# Patient Record
Sex: Female | Born: 1975 | Race: Asian | Hispanic: No | Marital: Married | Smoking: Never smoker
Health system: Southern US, Community
[De-identification: ages and names within clinical notes are randomized; demographics above are authoritative.]

## PROBLEM LIST (undated history)

## (undated) DIAGNOSIS — C719 Malignant neoplasm of brain, unspecified: Secondary | ICD-10-CM

## (undated) DIAGNOSIS — R7303 Prediabetes: Secondary | ICD-10-CM

## (undated) DIAGNOSIS — I10 Essential (primary) hypertension: Secondary | ICD-10-CM

## (undated) DIAGNOSIS — M3501 Sicca syndrome with keratoconjunctivitis: Secondary | ICD-10-CM

## (undated) HISTORY — DX: Essential (primary) hypertension: I10

## (undated) HISTORY — DX: Malignant neoplasm of brain, unspecified: C71.9

## (undated) HISTORY — DX: Sjogren syndrome with keratoconjunctivitis: M35.01

## (undated) HISTORY — DX: Prediabetes: R73.03

## (undated) HISTORY — PX: DILATION AND CURETTAGE, DIAGNOSTIC / THERAPEUTIC: SUR384

---

## 2015-07-29 DIAGNOSIS — D251 Intramural leiomyoma of uterus: Secondary | ICD-10-CM | POA: Insufficient documentation

## 2017-12-26 ENCOUNTER — Encounter: Payer: Self-pay | Admitting: Family Medicine

## 2017-12-26 ENCOUNTER — Ambulatory Visit: Payer: BLUE CROSS/BLUE SHIELD | Admitting: Family Medicine

## 2017-12-26 VITALS — BP 130/90 | HR 59 | Temp 97.8°F | Ht 59.0 in | Wt 151.6 lb

## 2017-12-26 DIAGNOSIS — R7303 Prediabetes: Secondary | ICD-10-CM | POA: Diagnosis not present

## 2017-12-26 DIAGNOSIS — I10 Essential (primary) hypertension: Secondary | ICD-10-CM | POA: Diagnosis not present

## 2017-12-26 DIAGNOSIS — Z0000 Encounter for general adult medical examination without abnormal findings: Secondary | ICD-10-CM

## 2017-12-26 MED ORDER — NIFEDIPINE ER 30 MG PO TB24: 30.0000 mg | ORAL_TABLET | Freq: Every day | ORAL | 3 refills | Status: DC

## 2017-12-26 NOTE — Progress Notes (Signed)
Patient: Joanne Jackson MRN: [id number] DOB: 1975/05/03 PCP: Orma Flaming, MD     Subjective:  Chief Complaint  Patient presents with  . Establish Care  . Hypertension    HPI: The patient is a 42 y.o. female who presents today for annual exam. She denies any changes to past medical history. There have been no recent hospitalizations. She does not exercise like she should, but tries to follow a healthy diet. Weight has been stable.   Hypertension: Here for follow up of hypertension.  Currently on Nifedipine 30mg  XR. Home readings range from 702 OVZCHYIF/02 diastolic. Takes medication as prescribed and denies any side effects. Exercise includes chasing daughter. Needs to start exercising more. Weight has been stable. Denies any chest pain, headaches, shortness of breath, vision changes, swelling in lower extremities. Diagnosed 7 years ago.   Also has labs that were done in august with previous pcp: cbc, cmp, lipid all normal.  a1c was 5.8.   There is no immunization history on file for this patient.   Colonoscopy: routine screening at 51 years.  Mammogram: she is still breast feeding.. Will get when done.  Pap smear: 2019. Normal  Tdap: utd 09/2015 (in pregnancy)  Hiv: done  Flu: done this year  Review of Systems  Constitutional: Negative for chills, fatigue and fever.  HENT: Negative for dental problem, ear pain, hearing loss and trouble swallowing.   Eyes: Negative for visual disturbance.  Respiratory: Negative for cough, chest tightness and shortness of breath.   Cardiovascular: Negative for chest pain, palpitations and leg swelling.  Gastrointestinal: Negative for abdominal pain, blood in stool, constipation, diarrhea and nausea.  Endocrine: Negative for cold intolerance, polydipsia, polyphagia and polyuria.  Genitourinary: Negative for dysuria and hematuria.  Musculoskeletal: Negative for arthralgias.  Skin: Negative.  Negative for rash.  Neurological: Negative for  dizziness and headaches.  Psychiatric/Behavioral: Negative for dysphoric mood and sleep disturbance. The patient is not nervous/anxious.     Allergies Patient has No Known Allergies.  Past Medical History Patient  has a past medical history of Hypertension and Pre-diabetes.  Surgical History Patient  has a past surgical history that includes Cesarean section and Dilation and curettage, diagnostic / therapeutic.  Family History Pateint's family history includes Heart attack in her father; Hypertension in her father.  Social History Patient  reports that she has never smoked. She has never used smokeless tobacco. She reports that she drank alcohol. She reports that she does not use drugs.    Objective: Vitals:   12/26/17 0918 12/26/17 0955  BP: 118/74 130/90  Pulse: (!) 59   Temp: 97.8 F (36.6 C)   TempSrc: Oral   SpO2: 99%   Weight: 151 lb 9.6 oz (68.8 kg)   Height: 4\' 11"  (1.499 m)     Body mass index is 30.62 kg/m.  Physical Exam  Constitutional: She is oriented to person, place, and time. She appears well-developed and well-nourished.  HENT:  Right Ear: External ear normal.  Left Ear: External ear normal.  Mouth/Throat: Oropharynx is clear and moist.  Eyes: Pupils are equal, round, and reactive to light. Conjunctivae and EOM are normal.  Neck: Normal range of motion. Neck supple. No thyromegaly present.  Cardiovascular: Normal rate, regular rhythm, normal heart sounds and intact distal pulses.  No murmur heard. Pulmonary/Chest: Effort normal and breath sounds normal.  Abdominal: Soft. Bowel sounds are normal. She exhibits no distension. There is no tenderness.  Lymphadenopathy:    She has no cervical adenopathy.  Neurological:  She is alert and oriented to person, place, and time. She displays normal reflexes. No cranial nerve deficit. Coordination normal.  Skin: Skin is warm and dry. No rash noted.  Psychiatric: She has a normal mood and affect. Her behavior is  normal.  Vitals reviewed.  Depression screen [facility] 2/9 12/26/2017  Decreased Interest 0  Down, Depressed, Hopeless 0  PHQ - 2 Score 0       Assessment/plan: 1. Annual physical exam utd on her health maintenance including flu shot. Requesting all records. She has labs with her today. Reviewed all of these. Requesting records of her other OV. Really encouraged her to start working on exercise into her daily routine. F/u in one year or as needed.  Patient counseling [x]    Nutrition: Stressed importance of moderation in sodium/caffeine intake, saturated fat and cholesterol, caloric balance, sufficient intake of fresh fruits, vegetables, fiber, calcium, iron, and 1 mg of folate supplement per day (for females capable of pregnancy).  [x]    Stressed the importance of regular exercise.   []    Substance Abuse: Discussed cessation/primary prevention of tobacco, alcohol, or other drug use; driving or other dangerous activities under the influence; availability of treatment for abuse.   [x]    Injury prevention: Discussed safety belts, safety helmets, smoke detector, smoking near bedding or upholstery.   [x]    Sexuality: Discussed sexually transmitted diseases, partner selection, use of condoms, avoidance of unintended pregnancy  and contraceptive alternatives.  [x]    Dental health: Discussed importance of regular tooth brushing, flossing, and dental visits.  [x]    Health maintenance and immunizations reviewed. Please refer to Health maintenance section.     2. Essential hypertension Home readings and repeat today just slightly above goal. Would like her diastolic less than 80. We are going to give her 6 months to get settled and start and exercise program and continue to work on weight loss. continue to keep log at home. F/u in 6 months. Labs reviewed and refill sent in for her.  - Microalbumin / creatinine urine ratio - CBC with Differential/Platelet - Lipid panel - Comprehensive metabolic  panel   3. Pre diabetes  Labs reviewed and scanned from august. a1c was 5.8. She has brought this down with diet alone from 6.1. Continue diet and try to start incorporating more exercise once settled from move. Recheck in 6 months.     Return in about 6 months (around 06/26/2018) for blood pressure/labs .    Orma Flaming, MD [place]  12/26/2017

## 2018-01-01 ENCOUNTER — Encounter: Payer: Self-pay | Admitting: Family Medicine

## 2018-01-01 ENCOUNTER — Ambulatory Visit: Payer: BLUE CROSS/BLUE SHIELD | Admitting: Family Medicine

## 2018-01-01 ENCOUNTER — Ambulatory Visit: Payer: Self-pay | Admitting: *Deleted

## 2018-01-01 VITALS — BP 138/98 | HR 68 | Temp 98.1°F | Ht 59.0 in | Wt 152.2 lb

## 2018-01-01 DIAGNOSIS — R079 Chest pain, unspecified: Secondary | ICD-10-CM

## 2018-01-01 DIAGNOSIS — I10 Essential (primary) hypertension: Secondary | ICD-10-CM | POA: Diagnosis not present

## 2018-01-01 NOTE — Telephone Encounter (Signed)
See note

## 2018-01-01 NOTE — Telephone Encounter (Signed)
Pt reports mid chest pain, towards left breast at times. States 1/10, "Like cramping." Denies diaphoresis, SOB, dizziness. States nausea in mornings "Not with pain." Pt has menses currently. Also reports pain worsened when picking up daughter yesterday. 3 episodes today, duration few seconds, 1/10. Also reports BP last night 140/110 before medication (Nifedipine 30mg ) States takes med at [facility].This am BP 120/87, prior to call 110/83. Denies headache. Appt made for today with Dr. Rogers Blocker. Care advise given per protocol.   Reason for Disposition . [1] Chest pain lasting <= 5 minutes AND [2] NO chest pain or cardiac symptoms now(Exceptions: pains lasting a few seconds)  Answer Assessment - Initial Assessment Questions 1. LOCATION: "Where does it hurt?"       Mid chest towards left at times 2. RADIATION: "Does the pain go anywhere else?" (e.g., into neck, jaw, arms, back)     no 3. ONSET: "When did the chest pain begin?" (Minutes, hours or days)      yesterday 4. PATTERN "Does the pain come and go, or has it been constant since it started?"  "Does it get worse with exertion?"      Comes and goes, not worse with exertion. Worsened when picked up daughter 5. DURATION: "How long does it last" (e.g., seconds, minutes, hours)     Few seconds 6. SEVERITY: "How bad is the pain?"  (e.g., Scale 1-10; mild, moderate, or severe)    - MILD (1-3): doesn't interfere with normal activities     - MODERATE (4-7): interferes with normal activities or awakens from sleep    - SEVERE (8-10): excruciating pain, unable to do any normal activities       1/10, "Like cramping." 7. CARDIAC RISK FACTORS: "Do you have any history of heart problems or risk factors for heart disease?" (e.g., prior heart attack, angina; high blood pressure, diabetes, being overweight, high cholesterol, smoking, or strong family history of heart disease)     HTN 8. PULMONARY RISK FACTORS: "Do you have any history of lung disease?"  (e.g., blood clots  in lung, asthma, emphysema, birth control pills)     no 9. CAUSE: "What do you think is causing the chest pain?"     Unsure  10. OTHER SYMPTOMS: "Do you have any other symptoms?" (e.g., dizziness, nausea, vomiting, sweating, fever, difficulty breathing, cough)       Mild nausea in mornings, not related to CP 11. PREGNANCY: "Is there any chance you are pregnant?" "When was your last menstrual period?"       No,  On menses now  Protocols used: CHEST PAIN-A-AH

## 2018-01-01 NOTE — Telephone Encounter (Signed)
Noted  

## 2018-01-01 NOTE — Progress Notes (Signed)
Patient: Joanne Jackson MRN: [id number] DOB: April 23, 1975 PCP: Orma Flaming, MD     Subjective:  Chief Complaint  Patient presents with  . Chest Pain    x 24 hrs    HPI: The patient is a 42 y.o. female who presents today for mild chest pain x 24 hrs. She also has had her blood pressure trending upwards. She states yesterday AM around 5:30 she started to have chest pain. It was in the center of her chest and it felt like something was pulling her "tendon" and described as cramping. It would last a few hours and then go away. She has not had this feeling again since noon today. She has not taken any medication for this. Exertion does not make this worse. She states it is random. She denies a crushing pain, no radiation down her left arm, no diaphoresis and no shortness of breath. She does not smoke. Hx of prediabetes and HTN. Blood pressure was 140/110 yesterday. Repeat was 113/87 after she took 1/2 of her bp pill.   Review of Systems  Respiratory: Negative for shortness of breath.   Cardiovascular: Positive for chest pain. Negative for palpitations.  Gastrointestinal: Positive for nausea. Negative for vomiting.  Musculoskeletal: Negative for back pain.    Allergies Patient has No Known Allergies.  Past Medical History Patient  has a past medical history of Hypertension and Pre-diabetes.  Surgical History Patient  has a past surgical history that includes Cesarean section and Dilation and curettage, diagnostic / therapeutic.  Family History Pateint's family history includes Heart attack in her father; Hypertension in her father.  Social History Patient  reports that she has never smoked. She has never used smokeless tobacco. She reports that she drank alcohol. She reports that she does not use drugs.    Objective: Vitals:   01/01/18 1429  BP: (!) 138/98  Pulse: 68  Temp: 98.1 F (36.7 C)  TempSrc: Oral  SpO2: 96%  Weight: 152 lb 3.2 oz (69 kg)  Height: 4\' 11"  (1.499 m)    Body mass index is 30.74 kg/m.  Physical Exam  Constitutional: She is oriented to person, place, and time. She appears well-developed and well-nourished.  Eyes: Pupils are equal, round, and reactive to light. EOM are normal.  Neck: Normal range of motion. Neck supple. No thyromegaly present.  Cardiovascular: Normal rate, regular rhythm, normal heart sounds and normal pulses.  No murmur heard. Pulmonary/Chest: Effort normal and breath sounds normal.  Abdominal: Soft. Bowel sounds are normal.  Musculoskeletal:  No reproducible chest pain   Neurological: She is alert and oriented to person, place, and time.  Vitals reviewed.    Ekg: NSR with rate of 73 no st abnormalities.     Assessment/plan: 1. Chest pain, unspecified type Appears atypical. Normal EKG. Hasn't had another episode. She is going to watch this over the weekend and if happens again will let me know and we will do stress test on her. Very strict ER precautions given.  - EKG 12-Lead  2. Essential hypertension BP has been consistently elevated so we are going to increase her medication to 1.5 pills. She is still breast feeding so we will keep her on this. Discussed once done breast feeding I would like to change her to something else. She is on board with this. Continue home log with the increased dose of medication.    Return if symptoms worsen or fail to improve.  Orma Flaming, MD [place]   01/01/2018

## 2018-01-01 NOTE — Patient Instructions (Signed)
Increase your BP medication to 1.5 pills. Once you are done breast feeding I want to change you to a different medication.    Chest pain that gets worse/crushing/etc. Go to ER.  Would just see how you do over weekend but if still experiencing we need to do a stress test on you.

## 2018-01-06 ENCOUNTER — Encounter: Payer: Self-pay | Admitting: Family Medicine

## 2018-06-26 ENCOUNTER — Ambulatory Visit (INDEPENDENT_AMBULATORY_CARE_PROVIDER_SITE_OTHER): Payer: BLUE CROSS/BLUE SHIELD | Admitting: Family Medicine

## 2018-06-26 ENCOUNTER — Encounter: Payer: Self-pay | Admitting: Family Medicine

## 2018-06-26 VITALS — BP 115/88 | Temp 97.5°F | Ht 59.0 in | Wt 148.0 lb

## 2018-06-26 DIAGNOSIS — R7303 Prediabetes: Secondary | ICD-10-CM

## 2018-06-26 DIAGNOSIS — I10 Essential (primary) hypertension: Secondary | ICD-10-CM | POA: Diagnosis not present

## 2018-06-26 MED ORDER — LISINOPRIL 20 MG PO TABS: 20.0000 mg | ORAL_TABLET | Freq: Every day | ORAL | 3 refills | Status: DC

## 2018-06-26 NOTE — Progress Notes (Signed)
Patient: Joanne Jackson MRN: [id number] DOB: 21-Jan-1976 PCP: Orma Flaming, MD     I connected with Joanne Jackson on 06/26/18 at  by a video enabled telemedicine application and verified that I am speaking with the correct person using two identifiers.  Location patient: Home Location provider: [facility], Office Persons participating in this virtual visit: Joanne Jackson and Dr. Rogers Blocker   I discussed the limitations of evaluation and management by telemedicine and the availability of in person appointments. The patient expressed understanding and agreed to proceed.   Subjective:  Chief Complaint  Patient presents with  . Hypertension    HPI: The patient is a 43 y.o. female who presents today for blood pressure follow up.   Hypertension: Here for follow up of hypertension.  Currently on nifedipine 30mg .  Was started on blood pressure medication during pregnancy. Labetalol initially used, but due to lack of response, changed to nifedipine. Recently stopped breast feeding and is interested in starting a different drug. I saw her back in November and we increased her 30mg  to 1.5 pills to help control her pressures as she was consistently running above goal.  She states she was getting headaches so dropped this back down to 30mg . Did not follow up as requested. Home readings range from 315-176 [id number]/62-69 diastolic. Takes medication as prescribed and denies any side effects.. Weight has been stable. Denies any chest pain, headaches, shortness of breath, vision changes, swelling in lower extremities. They are not interested in having anymore children.   Prediabetes: due for labs. I have her results from last August. Last a1c was 5.8.    Review of Systems  Constitutional: Negative for fatigue and unexpected weight change.  Eyes: Negative for visual disturbance.  Respiratory: Negative for shortness of breath.   Cardiovascular: Negative for chest pain.  Gastrointestinal: Negative for abdominal  pain and nausea.  Musculoskeletal: Negative for back pain and neck pain.  Neurological: Negative for dizziness and headaches.  Psychiatric/Behavioral: Negative for sleep disturbance.    Allergies Patient has No Known Allergies.  Past Medical History Patient  has a past medical history of Hypertension and Pre-diabetes.  Surgical History Patient  has a past surgical history that includes Cesarean section and Dilation and curettage, diagnostic / therapeutic.  Family History Pateint's family history includes Heart attack in her father; Hypertension in her father.  Social History Patient  reports that she has never smoked. She has never used smokeless tobacco. She reports previous alcohol use. She reports that she does not use drugs.    Objective: Vitals:   06/26/18 0907  BP: 115/88  Temp: (!) 97.5 F (36.4 C)  TempSrc: Oral  Weight: 148 lb (67.1 kg)  Height: 4\' 11"  (1.499 m)    Body mass index is 29.89 kg/m.  Physical Exam Vitals signs reviewed.  Constitutional:      Appearance: Normal appearance.  HENT:     Head: Normocephalic and atraumatic.  Eyes:     Extraocular Movements: Extraocular movements intact.  Pulmonary:     Effort: Pulmonary effort is normal.  Skin:    General: Skin is warm.     Capillary Refill: Capillary refill takes less than 2 seconds.  Neurological:     General: No focal deficit present.     Mental Status: She is alert and oriented to person, place, and time.  Psychiatric:        Mood and Affect: Mood normal.        Behavior: Behavior normal.  Assessment/plan: 1. Essential hypertension She is done breast feeding and we are going to change blood pressure medication. Weaning off her nifidepine over a few days and then will start her on lisinopril 20mg  daily. Discussed with her prediabetes this would be a good option. Side effects of ACE-I discussed including dry cough and angioedema. She is to call me if she starts to have a dry cough  and she is to call 911 or go to ER if any signs/symptoms of angioedema. Will see her back in 2 weeks for recheck before I go on maternity leave. Also asked that she keep a log for me. Also discussed she can not get pregnancy on this  Medication. They are not interested in having any more children. Using condoms for birth control.   - Basic metabolic panel; Future  2. Prediabetes Will come in for lab work. Discussed needs q 6 months.  - Hemoglobin A1c; Future    Return in about 2 weeks (around 07/10/2018) for bp check .    Orma Flaming, MD [place]  06/26/2018

## 2018-06-27 ENCOUNTER — Other Ambulatory Visit (INDEPENDENT_AMBULATORY_CARE_PROVIDER_SITE_OTHER): Payer: BLUE CROSS/BLUE SHIELD

## 2018-06-27 ENCOUNTER — Encounter: Payer: Self-pay | Admitting: Family Medicine

## 2018-06-27 ENCOUNTER — Other Ambulatory Visit: Payer: Self-pay

## 2018-06-27 DIAGNOSIS — R7303 Prediabetes: Secondary | ICD-10-CM

## 2018-06-27 DIAGNOSIS — I10 Essential (primary) hypertension: Secondary | ICD-10-CM

## 2018-06-27 LAB — HEMOGLOBIN A1C: Hgb A1c MFr Bld: 5.9 % (ref 4.6–6.5)

## 2018-06-27 LAB — BASIC METABOLIC PANEL
BUN: 9 mg/dL (ref 6–23)
CO2: 23 mEq/L (ref 19–32)
Calcium: 9.5 mg/dL (ref 8.4–10.5)
Chloride: 103 mEq/L (ref 96–112)
Creatinine, Ser: 0.54 mg/dL (ref 0.40–1.20)
GFR: 124.06 mL/min (ref >60.00)
Glucose, Bld: 87 mg/dL (ref 70–99)
Potassium: 3.6 mEq/L (ref 3.5–5.1)
Sodium: 137 mEq/L (ref 135–145)

## 2018-07-16 ENCOUNTER — Encounter: Payer: Self-pay | Admitting: Family Medicine

## 2018-07-18 ENCOUNTER — Encounter: Payer: Self-pay | Admitting: Family Medicine

## 2018-07-18 ENCOUNTER — Other Ambulatory Visit: Payer: Self-pay

## 2018-07-18 ENCOUNTER — Telehealth: Payer: Self-pay

## 2018-07-18 ENCOUNTER — Ambulatory Visit: Payer: BC Managed Care – PPO | Admitting: Family Medicine

## 2018-07-18 VITALS — BP 138/92 | HR 77 | Temp 98.2°F | Ht 59.0 in | Wt 155.2 lb

## 2018-07-18 DIAGNOSIS — R197 Diarrhea, unspecified: Secondary | ICD-10-CM | POA: Diagnosis not present

## 2018-07-18 NOTE — Telephone Encounter (Signed)
Copied from [place] [id number]. Topic: Appointment Scheduling - Scheduling Inquiry for Clinic >> Jul 18, 2018  9:46 AM Scherrie Gerlach wrote: Reason for CRM: pt has appt 6/16, but requesting sooner appt.  Line is busy at the office.  Pt would like a call back for sooner appt

## 2018-07-18 NOTE — Progress Notes (Signed)
Subjective  CC:  Chief Complaint  Patient presents with  . persistent diarrhea    HPI: Joanne Jackson is a 43 y.o. female who presents to the office today to address the problems listed above in the chief complaint. 43 yo with 2-3 week history of increased frequency of stooling. Typically went once daily but now, some days will go 3-5x/day. Mostly looser than normal although sometimes formed. No watery diarrhea. No propulsive diarrhea. No abdominal pain or cramping. Nl appetite. No f/c/s. Can't be sure when exactly it started. See mychart notes as well. Doesn't feel sick. No blood or mucous or melena. No recent travel or abx use.    Assessment  1. Diarrhea, unspecified type      Plan   diarrhea:  Check for infection with stool studies. Start probiotic and monitor. Further eval if persist. May try immodium as well.   Follow up: 2-3 weeks if not improving.   Visit date not found  No orders of the defined types were placed in this encounter.  No orders of the defined types were placed in this encounter.     I reviewed the patients updated PMH, FH, and SocHx.    Patient Active Problem List   Diagnosis Date Noted  . Essential hypertension 12/26/2017  . Prediabetes 12/26/2017   Current Meds  Medication Sig  . lisinopril (ZESTRIL) 20 MG tablet Take 1 tablet (20 mg total) by mouth daily.  . [DISCONTINUED] NIFEdipine (ADALAT CC) 30 MG 24 hr tablet Take 1 tablet (30 mg total) by mouth daily.    Allergies: Patient has No Known Allergies. Family History: Patient family history includes Heart attack in her father; Hypertension in her father. Social History:  Patient  reports that she has never smoked. She has never used smokeless tobacco. She reports previous alcohol use. She reports that she does not use drugs.  Review of Systems: Constitutional: Negative for fever malaise or anorexia Cardiovascular: negative for chest pain Respiratory: negative for SOB or persistent cough  Gastrointestinal: negative for abdominal pain  Objective  Vitals: BP (!) 138/92 (BP Location: Left Arm, Patient Position: Sitting, Cuff Size: Normal)   Pulse 77   Temp 98.2 F (36.8 C) (Oral)   Ht 4\' 11"  (1.499 m)   Wt 155 lb 3.2 oz (70.4 kg)   LMP 06/29/2018 (Exact Date)   SpO2 98%   BMI 31.35 kg/m  General: no acute distress , A&Ox3 HEENT: PEERL, conjunctiva normal, Oropharynx moist,neck is supple Cardiovascular:  RRR without murmur or gallop.  Respiratory:  Good breath sounds bilaterally, CTAB with normal respiratory effort Gastrointestinal: soft, flat abdomen, normal active bowel sounds, no palpable masses, no hepatosplenomegaly, no appreciated hernias Skin:  Warm, no rashes     Commons side effects, risks, benefits, and alternatives for medications and treatment plan prescribed today were discussed, and the patient expressed understanding of the given instructions. Patient is instructed to call or message via MyChart if he/she has any questions or concerns regarding our treatment plan. No barriers to understanding were identified. We discussed Red Flag symptoms and signs in detail. Patient expressed understanding regarding what to do in case of urgent or emergency type symptoms.   Medication list was reconciled, printed and provided to the patient in AVS. Patient instructions and summary information was reviewed with the patient as documented in the AVS. This note was prepared with assistance of Dragon voice recognition software. Occasional wrong-word or sound-a-like substitutions may have occurred due to the inherent limitations of voice recognition  software

## 2018-07-18 NOTE — Patient Instructions (Signed)
Please return next week with stool samples for testing.  Please start taking an otc probiotic daily for the next month.  You may use immodium if needed for frequent bowel movements.  Follow up here in 2-4 weeks if it is not improving.   If you have any questions or concerns, please don't hesitate to send me a message via MyChart or call the office at [phone]. Thank you for visiting with Joanne Jackson today! It's our pleasure caring for you.   Diarrhea, Adult Diarrhea is frequent loose and watery bowel movements. Diarrhea can make you feel weak and cause you to become dehydrated. Dehydration can make you tired and thirsty, cause you to have a dry mouth, and decrease how often you urinate. Diarrhea typically lasts 2-3 days. However, it can last longer if it is a sign of something more serious. It is important to treat your diarrhea as told by your health care provider. Follow these instructions at home: Eating and drinking     Follow these recommendations as told by your health care provider:  Take an oral rehydration solution (ORS). This is an over-the-counter medicine that helps return your body to its normal balance of nutrients and water. It is found at pharmacies and retail stores.  Drink plenty of fluids, such as water, ice chips, diluted fruit juice, and low-calorie sports drinks. You can drink milk also, if desired.  Avoid drinking fluids that contain a lot of sugar or caffeine, such as energy drinks, sports drinks, and soda.  Eat bland, easy-to-digest foods in small amounts as you are able. These foods include bananas, applesauce, rice, lean meats, toast, and crackers.  Avoid alcohol.  Avoid spicy or fatty foods.  Medicines  Take over-the-counter and prescription medicines only as told by your health care provider.  If you were prescribed an antibiotic medicine, take it as told by your health care provider. Do not stop using the antibiotic even if you start to feel better. General  instructions   Wash your hands often using soap and water. If soap and water are not available, use a hand sanitizer. Others in the household should wash their hands as well. Hands should be washed: ? After using the toilet or changing a diaper. ? Before preparing, cooking, or serving food. ? While caring for a sick person or while visiting someone in a hospital.  Drink enough fluid to keep your urine pale yellow.  Rest at home while you recover.  Watch your condition for any changes.  Take a warm bath to relieve any burning or pain from frequent diarrhea episodes.  Keep all follow-up visits as told by your health care provider. This is important. Contact a health care provider if:  You have a fever.  Your diarrhea gets worse.  You have new symptoms.  You cannot keep fluids down.  You feel light-headed or dizzy.  You have a headache.  You have muscle cramps. Get help right away if:  You have chest pain.  You feel extremely weak or you faint.  You have bloody or black stools or stools that look like tar.  You have severe pain, cramping, or bloating in your abdomen.  You have trouble breathing or you are breathing very quickly.  Your heart is beating very quickly.  Your skin feels cold and clammy.  You feel confused.  You have signs of dehydration, such as: ? Dark urine, very little urine, or no urine. ? Cracked lips. ? Dry mouth. ? Sunken eyes. ? Sleepiness. ?  Weakness. Summary  Diarrhea is frequent loose and watery bowel movements. Diarrhea can make you feel weak and cause you to become dehydrated.  Drink enough fluids to keep your urine pale yellow.  Make sure that you wash your hands after using the toilet. If soap and water are not available, use hand sanitizer.  Contact a health care provider if your diarrhea gets worse or you have new symptoms.  Get help right away if you have signs of dehydration. This information is not intended to replace  advice given to you by your health care provider. Make sure you discuss any questions you have with your health care provider. Document Released: 01/12/2002 Document Revised: 06/28/2017 Document Reviewed: 06/28/2017 Elsevier Interactive Patient Education  2019 [employer].

## 2018-07-18 NOTE — Telephone Encounter (Signed)
See below

## 2018-07-21 ENCOUNTER — Other Ambulatory Visit (INDEPENDENT_AMBULATORY_CARE_PROVIDER_SITE_OTHER): Payer: BC Managed Care – PPO

## 2018-07-21 DIAGNOSIS — R197 Diarrhea, unspecified: Secondary | ICD-10-CM | POA: Diagnosis not present

## 2018-07-21 NOTE — Addendum Note (Signed)
Addended by: Francis Dowse T on: 07/21/2018 09:43 AM   Modules accepted: Orders

## 2018-07-21 NOTE — Addendum Note (Signed)
Addended by: Francis Dowse T on: 07/21/2018 09:39 AM   Modules accepted: Orders

## 2018-07-21 NOTE — Addendum Note (Signed)
Addended by: Francis Dowse T on: 07/21/2018 09:38 AM   Modules accepted: Orders

## 2018-07-22 ENCOUNTER — Telehealth: Payer: Self-pay | Admitting: Physical Therapy

## 2018-07-22 LAB — FECAL OCCULT BLOOD, IMMUNOCHEMICAL: Fecal Occult Bld: NEGATIVE

## 2018-07-22 NOTE — Telephone Encounter (Signed)
LMOVM that results have not been finalized by lab and we will call once results are in.

## 2018-07-22 NOTE — Telephone Encounter (Signed)
Copied from [place] [id number]. Topic: General - Other >> Jul 22, 2018 11:27 AM Leward Quan A wrote: Reason for CRM: Patient called to request lab results of stool samples that was dropped off on 07/21/2018. Ph# [phone]

## 2018-07-23 ENCOUNTER — Ambulatory Visit: Payer: BLUE CROSS/BLUE SHIELD | Admitting: Family Medicine

## 2018-07-25 ENCOUNTER — Encounter: Payer: Self-pay | Admitting: Family Medicine

## 2018-07-25 LAB — STOOL CULTURE
MICRO NUMBER:: 569965
MICRO NUMBER:: 569966
MICRO NUMBER:: 569967
SHIGA RESULT:: NOT DETECTED
SPECIMEN QUALITY:: ADEQUATE
SPECIMEN QUALITY:: ADEQUATE
SPECIMEN QUALITY:: ADEQUATE

## 2018-07-25 LAB — OVA AND PARASITE EXAMINATION
CONCENTRATE RESULT:: NONE SEEN
MICRO NUMBER:: 569750
SPECIMEN QUALITY:: ADEQUATE
TRICHROME RESULT:: NONE SEEN

## 2018-07-25 LAB — CLOSTRIDIUM DIFFICILE TOXIN B, QUALITATIVE, REAL-TIME PCR: Toxigenic C. Difficile by PCR: NOT DETECTED

## 2018-08-17 ENCOUNTER — Encounter: Payer: Self-pay | Admitting: Family Medicine

## 2018-08-17 DIAGNOSIS — R197 Diarrhea, unspecified: Secondary | ICD-10-CM

## 2018-08-17 DIAGNOSIS — R198 Other specified symptoms and signs involving the digestive system and abdomen: Secondary | ICD-10-CM

## 2018-08-27 ENCOUNTER — Ambulatory Visit: Payer: BC Managed Care – PPO | Admitting: Gastroenterology

## 2018-09-03 ENCOUNTER — Other Ambulatory Visit: Payer: Self-pay

## 2018-09-03 ENCOUNTER — Encounter: Payer: Self-pay | Admitting: Gastroenterology

## 2018-09-03 ENCOUNTER — Telehealth (INDEPENDENT_AMBULATORY_CARE_PROVIDER_SITE_OTHER): Payer: BC Managed Care – PPO | Admitting: Gastroenterology

## 2018-09-03 VITALS — Ht 59.0 in | Wt 150.0 lb

## 2018-09-03 DIAGNOSIS — R198 Other specified symptoms and signs involving the digestive system and abdomen: Secondary | ICD-10-CM

## 2018-09-03 DIAGNOSIS — R195 Other fecal abnormalities: Secondary | ICD-10-CM

## 2018-09-03 DIAGNOSIS — R0989 Other specified symptoms and signs involving the circulatory and respiratory systems: Secondary | ICD-10-CM | POA: Diagnosis not present

## 2018-09-03 DIAGNOSIS — R12 Heartburn: Secondary | ICD-10-CM

## 2018-09-03 DIAGNOSIS — R1013 Epigastric pain: Secondary | ICD-10-CM

## 2018-09-03 MED ORDER — CLENPIQ 10-3.5-12 MG-GM -GM/160ML PO SOLN: 1.0000 | ORAL | 0 refills | Status: DC

## 2018-09-03 MED ORDER — OMEPRAZOLE 40 MG PO CPDR: 40.0000 mg | DELAYED_RELEASE_CAPSULE | Freq: Two times a day (BID) | ORAL | 0 refills | Status: DC

## 2018-09-03 MED ORDER — OMEPRAZOLE 40 MG PO CPDR: 40.0000 mg | DELAYED_RELEASE_CAPSULE | Freq: Every day | ORAL | 1 refills | Status: DC

## 2018-09-03 NOTE — Progress Notes (Signed)
Chief Complaint: Change in bowel habits, rectal pressure, abdominal pain, heartburn, early satiety, nausea  Referring Provider:     Leamon Arnt, MD   HPI:    Due to current restrictions/limitations of in-office visits due to the COVID-19 pandemic, this scheduled clinical appointment was converted to a telehealth virtual consultation using D[employer].  -Time of medical discussion: 23 minutes -The patient did consent to this virtual visit and is aware of possible charges through their insurance for this visit.  -Names of all parties present: Joanne Jackson (patient), Gerrit Heck, DO, [facility] (physician) -Patient location: Parked car -Physician location: Office  Joanne Jackson) is a 43 y.o. female referred to the Gastroenterology Clinic for evaluation of multiple GI complaints:  1) Change in bowel habits: Increased stool frequency and loose, nonbloody stools for the last 2 months, with up to 3-5 BM/day.  No watery stool.  No fecal incontinence/seepage.  No preceding antibiotics, change in medications, hospitalizations, sick contacts, change in diet, etc.  No nocturnal stools. Was seen by [facility] for this issue in 07/2018, with stool studies normal. She has been taking probiotics for the last 3 weeks or so, but still with 2-3 soft, mushy stools/day. No hematochezia or melena.  No weight loss, night sweats, fever, chills.  Also with rectal pressure which can last through the day with feeling of needing to have BM. No pain. No dyschezia. Started approx 10 days ago.   2) Dyspepsia: Burning pain in MEG. Lasts a few seconds, typically after drinking liquids. Started approx 10 days ago. Nausea without emesis.  No radiation.  3) Reflux: Heartburn recently started this week. Similar sxs during pregnancy. Tried Nexium during pregnancy, but no meds this week. No dysphagia. Increased throat clearing and mucus sensation/globus sensation for approx 5 years. Worse in the AM. No regurgitation  per se.   No previous EGD or colonoscopy.  No known family history of CRC, GI malignancy, liver disease, pancreatic disease, or IBD.   Past medical history, past surgical history, social history, family history, medications, and allergies reviewed in the chart and with patient.    Past Medical History:  Diagnosis Date  . Hypertension   . Pre-diabetes      Past Surgical History:  Procedure Laterality Date  . CESAREAN SECTION    . DILATION AND CURETTAGE, DIAGNOSTIC / THERAPEUTIC     Family History  Problem Relation Age of Onset  . Heart attack Father   . Hypertension Father   . Colon cancer Neg Hx    Social History   Tobacco Use  . Smoking status: Never Smoker  . Smokeless tobacco: Never Used  Substance Use Topics  . Alcohol use: Not Currently  . Drug use: Never   Current Outpatient Medications  Medication Sig Dispense Refill  . lisinopril (ZESTRIL) 20 MG tablet Take 1 tablet (20 mg total) by mouth daily. 90 tablet 3   No current facility-administered medications for this visit.    No Known Allergies   Review of Systems: All systems reviewed and negative except where noted in HPI.     Physical Exam:    Complete physical exam not completed due to the nature of this telehealth communication.   Gen: Awake, alert, and oriented, and well communicative. HEENT: EOMI, non-icteric sclera, NCAT, MMM Neck: Normal movement of head and neck Pulm: No labored breathing, speaking in full sentences without conversational dyspnea Derm: No apparent lesions or bruising in visible  field MS: Moves all visible extremities without noticeable abnormality Psych: Pleasant, cooperative, normal speech, thought processing seemingly intact   ASSESSMENT AND PLAN;   1) Change in bowel habits 2) Rectal pressure  -Colonoscopy to assess for luminal/mucosal etiology with random/directed biopsies -Check TSH, CBC, BMP  3) Globus sensation 4) Heartburn 5) Dyspepsia  -Offered EGD to  evaluate for mucosal/luminal etiology, LES laxity, erosive esophagitis, etc.  However, she would rather proceed with trial of medical management for reflux for diagnostic/therapeutic intent -Start Prilosec 40 mg p.o. bid x4 weeks, then reduce to 40 mg daily -If no improvement with trial of high-dose PPI, plan for EGD with gastric and duodenal biopsies  The indications, risks, and benefits of colonoscopy were explained to the patient in detail. Risks include but are not limited to bleeding, perforation, adverse reaction to medications, and cardiopulmonary compromise. Sequelae include but are not limited to the possibility of surgery, hospitalization, and mortality. The patient verbalized understanding and wished to proceed. All questions answered, referred to the scheduler and bowel prep ordered. Further recommendations pending results of the exam.     Lavena Bullion, DO, FACG  09/03/2018, 3:35 PM   Leamon Arnt, MD

## 2018-09-03 NOTE — Patient Instructions (Addendum)
If you are age 42 or older, your body mass index should be between 23-30. Your Body mass index is 30.3 kg/m. If this is out of the aforementioned range listed, please consider follow up with your Primary Care Provider.  If you are age 82 or younger, your body mass index should be between 19-25. Your Body mass index is 30.3 kg/m. If this is out of the aformentioned range listed, please consider follow up with your Primary Care Provider.   To help prevent the possible spread of infection to our patients, communities, and staff; we will be implementing the following measures:  As of now we are not allowing any visitors/family members to accompany you to any upcoming appointments with [facility]. If you have any concerns about this please contact our office to discuss prior to the appointment.   We have sent the following medications to your pharmacy for you to pick up at your convenience: Prilosec 40mg  twice daily for 4 weeks the decrease to once daily. Clenpiq  Your provider has requested that you go to the basement level for lab work at our [place] location ([address] [place] [address]) . Press "B" on the elevator. The lab is located at the first door on the left as you exit the elevator. You may go at whatever time is convienent for you. The current hours of operations are Monday- Friday 7:30am-4:30pm.  You have been scheduled for a colonoscopy. Please follow written instructions given to you at your visit today.  Please pick up your prep supplies at the pharmacy within the next 1-3 days. If you use inhalers (even only as needed), please bring them with you on the day of your procedure. Your physician has requested that you go to www.startemmi.com and enter the access code given to you at your visit today. This web site gives a general overview about your procedure. However, you should still follow specific instructions given to you by our office regarding your preparation  for the procedure.  Please call our office at [phone] to set up your 3 month follow up visit.  It was a pleasure to see you today!  Vito Cirigliano, D.O.

## 2018-09-11 ENCOUNTER — Ambulatory Visit: Payer: BC Managed Care – PPO | Admitting: Gastroenterology

## 2018-09-14 ENCOUNTER — Encounter: Payer: Self-pay | Admitting: Family Medicine

## 2018-09-15 ENCOUNTER — Ambulatory Visit (INDEPENDENT_AMBULATORY_CARE_PROVIDER_SITE_OTHER): Payer: BC Managed Care – PPO | Admitting: Family Medicine

## 2018-09-15 ENCOUNTER — Encounter: Payer: Self-pay | Admitting: Family Medicine

## 2018-09-15 ENCOUNTER — Other Ambulatory Visit: Payer: Self-pay

## 2018-09-15 VITALS — BP 138/98 | HR 84 | Temp 98.5°F | Ht 59.0 in | Wt 155.4 lb

## 2018-09-15 DIAGNOSIS — L309 Dermatitis, unspecified: Secondary | ICD-10-CM | POA: Diagnosis not present

## 2018-09-15 MED ORDER — HYDROXYZINE HCL 25 MG PO TABS: ORAL_TABLET | ORAL | 0 refills | Status: DC

## 2018-09-15 MED ORDER — TRIAMCINOLONE ACETONIDE 0.5 % EX OINT: 1.0000 "application " | TOPICAL_OINTMENT | Freq: Two times a day (BID) | CUTANEOUS | 1 refills | Status: DC

## 2018-09-15 NOTE — Progress Notes (Signed)
Patient: Joanne Jackson MRN: [id number] DOB: 09-08-1975 PCP: Orma Flaming, MD     Subjective:  Chief Complaint  Patient presents with  . blisters on hands  . rash on chest    HPI: The patient is a 43 y.o. female who presents today for blisters on palms of hands and back of fingers on b/l hands, itchy and sore.  Also c/o rash on chest that also burns and itches. She thinks the rash started last week. Rash mainly on her upper chest area. It is red, raised and maculopapular in nature. Its itchy and burning. She has some blisters and itching on her hands as well. She has been washing her hands a lot. Same soap. No new soaps, detergents, plants, animals, foods, perfumes, products. Never had before. She has not taken any otc medication for this. Is having EGD and cscope this week for increased BM.   Review of Systems  Constitutional: Negative for chills, fatigue and fever.  HENT: Negative.   Eyes: Negative for visual disturbance.  Respiratory: Negative for cough and shortness of breath.   Cardiovascular: Negative.   Gastrointestinal: Negative for abdominal pain, diarrhea, nausea and vomiting.  Genitourinary: Negative.   Musculoskeletal: Negative for back pain, myalgias and neck pain.  Skin: Positive for rash.       Rash on chest and also on palms of hands and back of fingers.  C/o itching and burning  Neurological: Negative for dizziness and headaches.  Psychiatric/Behavioral: Negative for sleep disturbance. The patient is not nervous/anxious.     Allergies Patient has No Known Allergies.  Past Medical History Patient  has a past medical history of Hypertension and Pre-diabetes.  Surgical History Patient  has a past surgical history that includes Cesarean section and Dilation and curettage, diagnostic / therapeutic.  Family History Pateint's family history includes Heart attack in her father; Hypertension in her father.  Social History Patient  reports that she has never smoked.  She has never used smokeless tobacco. She reports previous alcohol use. She reports that she does not use drugs.    Objective: Vitals:   09/15/18 1452  BP: (!) 138/98  Pulse: 84  Temp: 98.5 F (36.9 C)  TempSrc: Temporal  SpO2: 98%  Weight: 155 lb 6.4 oz (70.5 kg)  Height: 4\' 11"  (1.499 m)    Body mass index is 31.39 kg/m.  Physical Exam Vitals signs reviewed.  Constitutional:      Appearance: Normal appearance.  HENT:     Head: Normocephalic and atraumatic.  Skin:    Findings: Rash (erythematous  maculopapular rash on anterior chest wall bilaterally. has raised vesicular leesions on fingers bilaterally. ) present.  Neurological:     General: No focal deficit present.     Mental Status: She is alert and oriented to person, place, and time.     Depression screen [facility] 2/9 09/15/2018 12/26/2017  Decreased Interest 0 0  Down, Depressed, Hopeless 0 0  PHQ - 2 Score 0 0      Assessment/plan: 1. 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 Will do topical steroid cream BID, atarax prn and zyrtec. Is not typical of dermatitis hepatiform associated with celiacs, but discussed in differential since having change in BM and EGD/scope next week. Let me know if not getting better.    Return if symptoms worsen or fail to improve.   Orma Flaming, MD [place]   09/15/2018

## 2018-09-15 NOTE — Patient Instructions (Signed)
Topical steroid cream to treat rash.. use twice a day. NOT ON FACE. No longer than 10 days. If not getting better, we need to do oral steroids, so email me. Other thought is celiac's disease, but does not look typical of the rash seen with this. Will know if you have this with biopsies from your EGD.   Eczema Eczema is a broad term for a group of skin conditions that cause skin to become rough and inflamed. Each type of eczema has different triggers, symptoms, and treatments. Eczema of any type is usually itchy and symptoms range from mild to severe. Eczema and its symptoms are not spread from person to person (are not contagious). It can appear on different parts of the body at different times. Your eczema may not look the same as someone else's eczema. What are the types of eczema? Atopic dermatitis This is a long-term (chronic) skin disease that keeps coming back (recurring). Usual symptoms are dry skin and small, solid pimples that may swell and leak fluid (weep). Contact dermatitis  This happens when something irritates the skin and causes a rash. The irritation can come from substances that you are allergic to (allergens), such as poison ivy, chemicals, or medicines that were applied to your skin. Dyshidrotic eczema This is a form of eczema on the hands and feet. It shows up as very itchy, fluid-filled blisters. It can affect people of any age, but is more common before age 60. Hand eczema  This causes very itchy areas of skin on the palms and sides of the hands and fingers. This type of eczema is common in industrial jobs where you may be exposed to many different types of irritants. Lichen simplex chronicus This type of eczema occurs when a person constantly scratches one area of the body. Repeated scratching of the area leads to thickened skin (lichenification). Lichen simplex chronicus can occur along with other types of eczema. It is more common in adults, but may be seen in children as  well. Nummular eczema This is a common type of eczema. It has no known cause. It typically causes a red, circular, crusty lesion (plaque) that may be itchy. Scratching may become a habit and can cause bleeding. Nummular eczema occurs most often in people of middle-age or older. It most often affects the hands. Seborrheic dermatitis This is a common skin disease that mainly affects the scalp. It may also affect any oily areas of the body, such as the face, sides of nose, eyebrows, ears, eyelids, and chest. It is marked by small scaling and redness of the skin (erythema). This can affect people of all ages. In infants, this condition is known as Chartered certified accountant." Stasis dermatitis This is a common skin disease that usually appears on the legs and feet. It most often occurs in people who have a condition that prevents blood from being pumped through the veins in the legs (chronic venous insufficiency). Stasis dermatitis is a chronic condition that needs long-term management. How is eczema diagnosed? Your health care provider will examine your skin and review your medical history. He or she may also give you skin patch tests. These tests involve taking patches that contain possible allergens and placing them on your back. He or she will then check in a few days to see if an allergic reaction occurred. What are the common treatments? Treatment for eczema is based on the type of eczema you have. Hydrocortisone steroid medicine can relieve itching quickly and help reduce inflammation. This  medicine may be prescribed or obtained over-the-counter, depending on the strength of the medicine that is needed. Follow these instructions at home:  Take over-the-counter and prescription medicines only as told by your health care provider.  Use creams or ointments to moisturize your skin. Do not use lotions.  Learn what triggers or irritates your symptoms. Avoid these things.  Treat symptom flare-ups quickly.  Do not  itch your skin. This can make your rash worse.  Keep all follow-up visits as told by your health care provider. This is important. Where to find more information  The American Academy of Dermatology: http://jones-macias.info/  The National Eczema Association: www.nationaleczema.org Contact a health care provider if:  You have serious itching, even with treatment.  You regularly scratch your skin until it bleeds.  Your rash looks different than usual.  Your skin is painful, swollen, or more red than usual.  You have a fever. Summary  There are eight general types of eczema. Each type has different triggers.  Eczema of any type causes itching that may range from mild to severe.  Treatment varies based on the type of eczema you have. Hydrocortisone steroid medicine can help with itching and inflammation.  Protecting your skin is the best way to prevent eczema. Use moisturizers and lotions. Avoid triggers and irritants, and treat flare-ups quickly. This information is not intended to replace advice given to you by your health care provider. Make sure you discuss any questions you have with your health care provider. Document Released: 06/07/2016 Document Revised: 01/04/2017 Document Reviewed: 06/07/2016 Elsevier Patient Education  2020 [employer].

## 2018-09-17 ENCOUNTER — Encounter: Payer: Self-pay | Admitting: Gastroenterology

## 2018-09-18 ENCOUNTER — Telehealth: Payer: Self-pay | Admitting: Gastroenterology

## 2018-09-18 NOTE — Telephone Encounter (Signed)
No problem, can cancel the EGD portion and just proceed with colonoscopy.  Thank you.

## 2018-09-18 NOTE — Telephone Encounter (Signed)
Is this appropriate treatment for the patient-if so I will amend the procedure at [facility];

## 2018-09-18 NOTE — Telephone Encounter (Signed)
Pt would like to cancel egd and only have the colonoscopy portion of her procedure that is scheduled for next Tuesday 8/18 with Dr. Bryan Lemma. Pls call pt to confirm.

## 2018-09-18 NOTE — Telephone Encounter (Signed)
Called and spoke with patient- patient reports she does not want to go through with the EGD part of her procedure on 09/23/2018 at 3:00 pm at [facility]-patient advised that Dr. Bryan Lemma has stated the patient does not have to proceed with that part of the procedure-EGD aspect of procedure has been removed form the procedure order and schedule; patient is aware that instructions for procedure have not changed, even with removing the EGD part; patient verbalized understanding of information/instructions; patient advised to call back to the office should questions/concerns arise; patient is still scheduled on 09/23/2018 at 3:00 pm; patient reports she printed her instructions off of MyChart and refer to them for specifics of dates/times

## 2018-09-22 ENCOUNTER — Telehealth: Payer: Self-pay | Admitting: Gastroenterology

## 2018-09-22 NOTE — Telephone Encounter (Signed)

## 2018-09-23 ENCOUNTER — Ambulatory Visit (AMBULATORY_SURGERY_CENTER): Payer: BC Managed Care – PPO | Admitting: Gastroenterology

## 2018-09-23 ENCOUNTER — Encounter: Payer: BC Managed Care – PPO | Admitting: Gastroenterology

## 2018-09-23 ENCOUNTER — Encounter: Payer: Self-pay | Admitting: Gastroenterology

## 2018-09-23 ENCOUNTER — Other Ambulatory Visit: Payer: Self-pay

## 2018-09-23 VITALS — BP 138/96 | HR 63 | Temp 98.4°F | Resp 20 | Ht 59.0 in | Wt 155.0 lb

## 2018-09-23 DIAGNOSIS — R194 Change in bowel habit: Secondary | ICD-10-CM | POA: Diagnosis not present

## 2018-09-23 DIAGNOSIS — D128 Benign neoplasm of rectum: Secondary | ICD-10-CM

## 2018-09-23 DIAGNOSIS — R195 Other fecal abnormalities: Secondary | ICD-10-CM | POA: Diagnosis not present

## 2018-09-23 DIAGNOSIS — K640 First degree hemorrhoids: Secondary | ICD-10-CM | POA: Diagnosis not present

## 2018-09-23 DIAGNOSIS — D125 Benign neoplasm of sigmoid colon: Secondary | ICD-10-CM

## 2018-09-23 DIAGNOSIS — K5730 Diverticulosis of large intestine without perforation or abscess without bleeding: Secondary | ICD-10-CM | POA: Diagnosis not present

## 2018-09-23 DIAGNOSIS — K635 Polyp of colon: Secondary | ICD-10-CM

## 2018-09-23 DIAGNOSIS — K529 Noninfective gastroenteritis and colitis, unspecified: Secondary | ICD-10-CM

## 2018-09-23 MED ORDER — SODIUM CHLORIDE 0.9 % IV SOLN: 500.0000 mL | Freq: Once | INTRAVENOUS | Status: DC

## 2018-09-23 NOTE — Patient Instructions (Signed)
YOU HAD AN ENDOSCOPIC PROCEDURE TODAY AT THE  ENDOSCOPY CENTER:   Refer to the procedure report that was given to you for any specific questions about what was found during the examination.  If the procedure report does not answer your questions, please call your gastroenterologist to clarify.  If you requested that your care partner not be given the details of your procedure findings, then the procedure report has been included in a sealed envelope for you to review at your convenience later.  YOU SHOULD EXPECT: Some feelings of bloating in the abdomen. Passage of more gas than usual.  Walking can help get rid of the air that was put into your GI tract during the procedure and reduce the bloating. If you had a lower endoscopy (such as a colonoscopy or flexible sigmoidoscopy) you may notice spotting of blood in your stool or on the toilet paper. If you underwent a bowel prep for your procedure, you may not have a normal bowel movement for a few days.  Please Note:  You might notice some irritation and congestion in your nose or some drainage.  This is from the oxygen used during your procedure.  There is no need for concern and it should clear up in a day or so.  SYMPTOMS TO REPORT IMMEDIATELY:   Following lower endoscopy (colonoscopy or flexible sigmoidoscopy):  Excessive amounts of blood in the stool  Significant tenderness or worsening of abdominal pains  Swelling of the abdomen that is new, acute  Fever of 100F or higher  For urgent or emergent issues, a gastroenterologist can be reached at any hour by calling [phone].   DIET:  We do recommend a small meal at first, but then you may proceed to your regular diet.  Drink plenty of fluids but you should avoid alcoholic beverages for 24 hours.  ACTIVITY:  You should plan to take it easy for the rest of today and you should NOT DRIVE or use heavy machinery until tomorrow (because of the sedation medicines used during the test).     FOLLOW UP: Our staff will call the number listed on your records 48-72 hours following your procedure to check on you and address any questions or concerns that you may have regarding the information given to you following your procedure. If we do not reach you, we will leave a message.  We will attempt to reach you two times.  During this call, we will ask if you have developed any symptoms of COVID 19. If you develop any symptoms (ie: fever, flu-like symptoms, shortness of breath, cough etc.) before then, please call [phone].  If you test positive for Covid 19 in the 2 weeks post procedure, please call and report this information to us.    If any biopsies were taken you will be contacted by phone or by letter within the next 1-3 weeks.  Please call us at [phone] if you have not heard about the biopsies in 3 weeks.    SIGNATURES/CONFIDENTIALITY: You and/or your care partner have signed paperwork which will be entered into your electronic medical record.  These signatures attest to the fact that that the information above on your After Visit Summary has been reviewed and is understood.  Full responsibility of the confidentiality of this discharge information lies with you and/or your care-partner. 

## 2018-09-23 NOTE — Progress Notes (Signed)
Called to room to assist during endoscopic procedure.  Patient ID and intended procedure confirmed with present staff. Received instructions for my participation in the procedure from the performing physician.  

## 2018-09-23 NOTE — Progress Notes (Signed)
Vitals-Nancy Temp-Stephanie

## 2018-09-23 NOTE — Op Note (Signed)
[facility] Patient Name: Joanne Jackson Procedure Date: 09/23/2018 2:40 PM MRN: [id number] Endoscopist: Gerrit Heck , MD Age: 43 Referring MD:  Date of Birth: 29-Mar-1975 Gender: Female Account #: [id number] Procedure:                Colonoscopy Indications:              Change in bowel habits, Diarrhea. No hematochezia.                           No previous colonoscopy Medicines:                Monitored Anesthesia Care Procedure:                Pre-Anesthesia Assessment:                           - Prior to the procedure, a History and Physical                            was performed, and patient medications and                            allergies were reviewed. The patient's tolerance of                            previous anesthesia was also reviewed. The risks                            and benefits of the procedure and the sedation                            options and risks were discussed with the patient.                            All questions were answered, and informed consent                            was obtained. Prior Anticoagulants: The patient has                            taken no previous anticoagulant or antiplatelet                            agents. ASA Grade Assessment: II - A patient with                            mild systemic disease. After reviewing the risks                            and benefits, the patient was deemed in                            satisfactory condition to undergo the procedure.  After obtaining informed consent, the colonoscope                            was passed under direct vision. Throughout the                            procedure, the patient's blood pressure, pulse, and                            oxygen saturations were monitored continuously. The                            Colonoscope was introduced through the anus and                            advanced to the the terminal ileum.  The colonoscopy                            was performed without difficulty. The patient                            tolerated the procedure well. The quality of the                            bowel preparation was good. The terminal ileum,                            ileocecal valve, appendiceal orifice, and rectum                            were photographed. Scope In: 2:59:23 PM Scope Out: 3:16:12 PM Scope Withdrawal Time: 0 hours 14 minutes 16 seconds  Total Procedure Duration: 0 hours 16 minutes 49 seconds  Findings:                 The perianal and digital rectal examinations were                            normal.                           A 5 mm polyp was found in the sigmoid colon. The                            polyp was sessile. The polyp was removed with a                            cold snare. Resection and retrieval were complete.                            Estimated blood loss was minimal.                           A 4 mm polyp was found in the rectum. The polyp was  sessile. The polyp was removed with a cold snare.                            Resection and retrieval were complete. Estimated                            blood loss was minimal.                           Non-bleeding internal hemorrhoids were found during                            retroflexion. The hemorrhoids were small.                           A single small-mouthed diverticulum was found in                            the ascending colon.                           The descending colon, transverse colon, ascending                            colon and cecum all otherwise appeared normal.                            Biopsies for histology were taken with a cold                            forceps from the right colon and left colon for                            evaluation of microscopic colitis.                           The terminal ileum contained a few non-bleeding                             aphthae. The mucosa in between was otherwise normal                            appearing. Several mucosal biopsies were taken with                            a cold forceps for histology. The IC valve was                            widely patulous and demonstrated free flow of fluid                            anterograde and retrograde through the valve.  Estimated blood loss was minimal. Complications:            No immediate complications. Estimated Blood Loss:     Estimated blood loss was minimal. Impression:               - One 5 mm polyp in the sigmoid colon, removed with                            a cold snare. Resected and retrieved.                           - One 4 mm polyp in the rectum, removed with a cold                            snare. Resected and retrieved.                           - Non-bleeding internal hemorrhoids.                           - Diverticulosis in the ascending colon.                           - The descending colon, transverse colon, ascending                            colon and cecum are normal. Biopsied.                           - Aphtha in the terminal ileum. Biopsied.                           - Widely patulous ICV. Query IBD vs alternate                            source of anatomic disruption resulting in both                            mild backwash ileitis and diarrhea from loss of                            stop valve. Biopsies pending. Recommendation:           - Patient has a contact number available for                            emergencies. The signs and symptoms of potential                            delayed complications were discussed with the                            patient. Return to normal activities tomorrow.  Written discharge instructions were provided to the                            patient.                           - Resume previous diet today.                            - Continue present medications.                           - Await pathology results.                           - Repeat colonoscopy for surveillance based on                            pathology results.                           - Use fiber, for example Citrucel, Fibercon, Konsyl                            or Metamucil.                           - Return to GI clinic at appointment to be                            scheduled. Gerrit Heck, MD 09/23/2018 3:28:35 PM

## 2018-09-23 NOTE — Progress Notes (Signed)
Report given to PACU, vss 

## 2018-09-24 ENCOUNTER — Other Ambulatory Visit: Payer: Self-pay | Admitting: Gastroenterology

## 2018-09-25 ENCOUNTER — Telehealth: Payer: Self-pay

## 2018-09-25 NOTE — Telephone Encounter (Signed)
Called [phone] and left a messaged we tried to reach pt for a follow up call. maw

## 2018-09-30 ENCOUNTER — Telehealth: Payer: Self-pay | Admitting: Gastroenterology

## 2018-09-30 ENCOUNTER — Encounter: Payer: Self-pay | Admitting: Gastroenterology

## 2018-09-30 NOTE — Telephone Encounter (Signed)
Pt requested a call back to discuss pathology results from colon.

## 2018-09-30 NOTE — Telephone Encounter (Signed)
Unable to reach patient at this time and unable to leave a message; will attempt to reach patient at a later date/time;

## 2018-10-02 NOTE — Telephone Encounter (Signed)
Patient given results from pathology from letter that was sent to the patient-patient advised to call back should questions/concerns arise; patient verbalized understanding of information/instructions;

## 2018-10-24 ENCOUNTER — Encounter: Payer: Self-pay | Admitting: Family Medicine

## 2018-10-24 ENCOUNTER — Other Ambulatory Visit: Payer: Self-pay

## 2018-10-24 ENCOUNTER — Ambulatory Visit (INDEPENDENT_AMBULATORY_CARE_PROVIDER_SITE_OTHER): Payer: BC Managed Care – PPO | Admitting: Family Medicine

## 2018-10-24 VITALS — BP 136/90 | HR 83 | Temp 97.9°F | Ht 59.0 in | Wt 154.0 lb

## 2018-10-24 DIAGNOSIS — R7303 Prediabetes: Secondary | ICD-10-CM | POA: Diagnosis not present

## 2018-10-24 DIAGNOSIS — D126 Benign neoplasm of colon, unspecified: Secondary | ICD-10-CM | POA: Insufficient documentation

## 2018-10-24 DIAGNOSIS — Z114 Encounter for screening for human immunodeficiency virus [HIV]: Secondary | ICD-10-CM

## 2018-10-24 DIAGNOSIS — Z0000 Encounter for general adult medical examination without abnormal findings: Secondary | ICD-10-CM | POA: Diagnosis not present

## 2018-10-24 DIAGNOSIS — I10 Essential (primary) hypertension: Secondary | ICD-10-CM

## 2018-10-24 DIAGNOSIS — Z23 Encounter for immunization: Secondary | ICD-10-CM | POA: Diagnosis not present

## 2018-10-24 LAB — COMPREHENSIVE METABOLIC PANEL
ALT: 13 U/L (ref 0–35)
AST: 15 U/L (ref 0–37)
Albumin: 4.3 g/dL (ref 3.5–5.2)
Alkaline Phosphatase: 72 U/L (ref 39–117)
BUN: 9 mg/dL (ref 6–23)
CO2: 25 mEq/L (ref 19–32)
Calcium: 9.5 mg/dL (ref 8.4–10.5)
Chloride: 103 mEq/L (ref 96–112)
Creatinine, Ser: 0.57 mg/dL (ref 0.40–1.20)
GFR: 116.37 mL/min (ref >60.00)
Glucose, Bld: 86 mg/dL (ref 70–99)
Potassium: 4.2 mEq/L (ref 3.5–5.1)
Sodium: 137 mEq/L (ref 135–145)
Total Bilirubin: 0.6 mg/dL (ref 0.2–1.2)
Total Protein: 7.3 g/dL (ref 6.0–8.3)

## 2018-10-24 LAB — CBC WITH DIFFERENTIAL/PLATELET
Basophils Absolute: 0 10*3/uL (ref 0.0–0.1)
Basophils Relative: 0.7 % (ref 0.0–3.0)
Eosinophils Absolute: 0.2 10*3/uL (ref 0.0–0.7)
Eosinophils Relative: 2.9 % (ref 0.0–5.0)
HCT: 39.6 % (ref 36.0–46.0)
Hemoglobin: 13.4 g/dL (ref 12.0–15.0)
Lymphocytes Relative: 25.6 % (ref 12.0–46.0)
Lymphs Abs: 1.4 10*3/uL (ref 0.7–4.0)
MCHC: 33.8 g/dL (ref 30.0–36.0)
MCV: 83.6 fl (ref 78.0–100.0)
Monocytes Absolute: 0.3 10*3/uL (ref 0.1–1.0)
Monocytes Relative: 5.8 % (ref 3.0–12.0)
Neutro Abs: 3.4 10*3/uL (ref 1.4–7.7)
Neutrophils Relative %: 65 % (ref 43.0–77.0)
Platelets: 257 10*3/uL (ref 150.0–400.0)
RBC: 4.74 Mil/uL (ref 3.87–5.11)
RDW: 12.4 % (ref 11.5–15.5)
WBC: 5.3 10*3/uL (ref 4.0–10.5)

## 2018-10-24 LAB — HEMOGLOBIN A1C: Hgb A1c MFr Bld: 6.2 % (ref 4.6–6.5)

## 2018-10-24 LAB — MICROALBUMIN / CREATININE URINE RATIO
Creatinine,U: 49.7 mg/dL
Microalb Creat Ratio: 1.4 mg/g (ref 0.0–30.0)
Microalb, Ur: <0.7 mg/dL (ref 0.0–1.9)

## 2018-10-24 LAB — LIPID PANEL
Cholesterol: 149 mg/dL (ref 0–200)
HDL: 41.4 mg/dL (ref >39.00)
NonHDL: 107.12
Total CHOL/HDL Ratio: 4
Triglycerides: 206 mg/dL — ABNORMAL HIGH (ref 0.0–149.0)
VLDL: 41.2 mg/dL — ABNORMAL HIGH (ref 0.0–40.0)

## 2018-10-24 LAB — TSH: TSH: 0.75 u[IU]/mL (ref 0.35–4.50)

## 2018-10-24 MED ORDER — LISINOPRIL 30 MG PO TABS: 30.0000 mg | ORAL_TABLET | Freq: Every day | ORAL | 3 refills | Status: DC

## 2018-10-24 MED ORDER — AZELASTINE-FLUTICASONE 137-50 MCG/ACT NA SUSP: 1.0000 | Freq: Two times a day (BID) | NASAL | 3 refills | Status: DC

## 2018-10-24 NOTE — Progress Notes (Signed)
Patient: Joanne Jackson MRN: [id number] DOB: [birth year] PCP: Orma Flaming, MD     Subjective:  Chief Complaint  Patient presents with  . Annual Exam    HPI: The patient is a 43 y.o. female who presents today for annual exam. She denies any changes to past medical history. There have been no recent hospitalizations. They are following a well balanced diet and exercise plan. Weight has been stable. No complaints today.   No family hx of colon or breast cancer. Dad has HTN and CAD. Mom thyroid  Hypertension: Here for follow up of hypertension.  Currently on lisinopril 20mg . Home readings range from [id number] diastolic. Takes medication as prescribed and denies any side effects. Exercise includes walking. Weight has been stable. Denies any chest pain, headaches, shortness of breath, vision changes, swelling in lower extremities.   Prediabetes: due for lab check. Cooking all meals and walking daily.    Immunization History  Administered Date(s) Administered  . Influenza,inj,Quad PF,6+ Mos 10/24/2018    Colonoscopy: 09/2018 Mammogram: sees gyn next month  Pap smear: 10/14/2017 Tdap: 08/23/2017   Review of Systems  Constitutional: Negative for chills, fatigue and fever.  HENT: Negative for congestion, dental problem, ear pain, hearing loss, postnasal drip, rhinorrhea, sore throat and trouble swallowing.   Eyes: Negative for visual disturbance.  Respiratory: Negative for cough, chest tightness and shortness of breath.   Cardiovascular: Negative for chest pain, palpitations and leg swelling.  Gastrointestinal: Negative for abdominal pain, blood in stool, diarrhea, nausea, rectal pain and vomiting.  Endocrine: Negative for cold intolerance, polydipsia, polyphagia and polyuria.  Genitourinary: Negative for dysuria, frequency, hematuria and urgency.  Musculoskeletal: Positive for back pain. Negative for arthralgias, myalgias and neck pain.       C/o upper back pain at base of neck   Skin: Negative for rash.  Neurological: Negative for dizziness and headaches.  Psychiatric/Behavioral: Negative for dysphoric mood and sleep disturbance. The patient is not nervous/anxious.     Allergies Patient has No Known Allergies.  Past Medical History Patient  has a past medical history of Hypertension and Pre-diabetes.  Surgical History Patient  has a past surgical history that includes Cesarean section and Dilation and curettage, diagnostic / therapeutic.  Family History Pateint's family history includes Heart attack in her father; Hypertension in her father.  Social History Patient  reports that she has never smoked. She has never used smokeless tobacco. She reports previous alcohol use. She reports that she does not use drugs.    Objective: Vitals:   10/24/18 0821 10/24/18 0848  BP: 136/90 136/90  Pulse: 83   Temp: 97.9 F (36.6 C)   TempSrc: Skin   SpO2: 98%   Weight: 154 lb (69.9 kg)   Height: 4\' 11"  (1.499 m)     Body mass index is 31.1 kg/m.  Physical Exam Vitals signs reviewed.  Constitutional:      Appearance: Normal appearance. She is well-developed. She is obese.  HENT:     Head: Normocephalic and atraumatic.     Right Ear: Tympanic membrane, ear canal and external ear normal.     Left Ear: Tympanic membrane, ear canal and external ear normal.     Nose: Nose normal.     Mouth/Throat:     Mouth: Mucous membranes are moist.  Eyes:     Conjunctiva/sclera: Conjunctivae normal.     Pupils: Pupils are equal, round, and reactive to light.  Neck:     Musculoskeletal: Normal range of motion and neck  supple.     Thyroid: No thyromegaly.  Cardiovascular:     Rate and Rhythm: Normal rate and regular rhythm.     Pulses: Normal pulses.     Heart sounds: Normal heart sounds. No murmur.  Pulmonary:     Effort: Pulmonary effort is normal.     Breath sounds: Normal breath sounds.  Abdominal:     General: Abdomen is flat. Bowel sounds are normal. There is  no distension.     Palpations: Abdomen is soft.     Tenderness: There is no abdominal tenderness.  Lymphadenopathy:     Cervical: No cervical adenopathy.  Skin:    General: Skin is warm and dry.     Capillary Refill: Capillary refill takes less than 2 seconds.     Findings: No rash.  Neurological:     General: No focal deficit present.     Mental Status: She is alert and oriented to person, place, and time.     Cranial Nerves: No cranial nerve deficit.     Coordination: Coordination normal.     Deep Tendon Reflexes: Reflexes normal.  Psychiatric:        Mood and Affect: Mood normal.        Behavior: Behavior normal.        Depression screen [facility] 2/9 10/24/2018 09/15/2018 12/26/2017  Decreased Interest 0 0 0  Down, Depressed, Hopeless 0 0 0  PHQ - 2 Score 0 0 0     Assessment/plan: 1. Annual physical exam Routine lab work today. Flu shot. utd on her HM. Continue diet and working on exercise. Advised a dental visit. Will get mmg at her gyn in a few weeks. F/u in one year or as needed.  Patient counseling [x]    Nutrition: Stressed importance of moderation in sodium/caffeine intake, saturated fat and cholesterol, caloric balance, sufficient intake of fresh fruits, vegetables, fiber, calcium, iron, and 1 mg of folate supplement per day (for females capable of pregnancy).  [x]    Stressed the importance of regular exercise.   []    Substance Abuse: Discussed cessation/primary prevention of tobacco, alcohol, or other drug use; driving or other dangerous activities under the influence; availability of treatment for abuse.   [x]    Injury prevention: Discussed safety belts, safety helmets, smoke detector, smoking near bedding or upholstery.   [x]    Sexuality: Discussed sexually transmitted diseases, partner selection, use of condoms, avoidance of unintended pregnancy  and contraceptive alternatives.  [x]    Dental health: Discussed importance of regular tooth brushing, flossing, and dental  visits.  [x]    Health maintenance and immunizations reviewed. Please refer to Health maintenance section.     2. Encounter for screening for HIV  - HIV Antibody (routine testing w rflx)  3. Essential hypertension Blood pressure not to goal. Diastolic continues to be elevated. Increasing her lisinopril to 30mg /day. F/u in 6 months for regular pre diabetic appointment and will check then. She is to let me know if any issues. Routine lab work today. Refills given.  - CBC with Differential/Platelet - Comprehensive metabolic panel - Lipid panel - TSH - Microalbumin / creatinine urine ratio  4. Prediabetes Again really want her to work on weight/exercise. Is cooking more. We will see how her a1c is running.  - Hemoglobin A1c  5. Need for immunization against influenza  - Flu Vaccine QUAD 36+ mos IM   -as I was walking out of room said, "oh yah my leg and hip pain." she briefly mentioned she is  tight in hips and around knee. No trauma. Gave her handout on IT band exercises and asked that she follow up for this.    Return in about 6 months (around 04/23/2019) for blood pressure/prediabetes .   Orma Flaming, MD [place]  10/24/2018

## 2018-10-24 NOTE — Patient Instructions (Signed)

## 2018-10-25 LAB — HIV ANTIBODY (ROUTINE TESTING W REFLEX): HIV 1&2 Ab, 4th Generation: NONREACTIVE

## 2018-10-27 ENCOUNTER — Ambulatory Visit: Payer: BC Managed Care – PPO | Admitting: Gastroenterology

## 2018-10-27 ENCOUNTER — Encounter: Payer: Self-pay | Admitting: Family Medicine

## 2018-10-31 ENCOUNTER — Encounter: Payer: Self-pay | Admitting: Family Medicine

## 2018-10-31 ENCOUNTER — Ambulatory Visit (INDEPENDENT_AMBULATORY_CARE_PROVIDER_SITE_OTHER): Payer: BC Managed Care – PPO | Admitting: Family Medicine

## 2018-10-31 ENCOUNTER — Other Ambulatory Visit: Payer: Self-pay

## 2018-10-31 VITALS — BP 136/90 | HR 76 | Temp 97.9°F | Ht 59.0 in | Wt 153.8 lb

## 2018-10-31 DIAGNOSIS — M25552 Pain in left hip: Secondary | ICD-10-CM

## 2018-10-31 DIAGNOSIS — M25551 Pain in right hip: Secondary | ICD-10-CM

## 2018-10-31 DIAGNOSIS — M2550 Pain in unspecified joint: Secondary | ICD-10-CM

## 2018-10-31 DIAGNOSIS — Z0000 Encounter for general adult medical examination without abnormal findings: Secondary | ICD-10-CM

## 2018-10-31 LAB — C-REACTIVE PROTEIN: CRP: <1 mg/dL (ref 0.5–20.0)

## 2018-10-31 LAB — SEDIMENTATION RATE: Sed Rate: 26 mm/hr — ABNORMAL HIGH (ref 0–20)

## 2018-10-31 MED ORDER — BACLOFEN 10 MG PO TABS: 10.0000 mg | ORAL_TABLET | Freq: Three times a day (TID) | ORAL | 1 refills | Status: DC

## 2018-10-31 NOTE — Progress Notes (Signed)
Patient: Joanne Jackson MRN: [id number] DOB: 03-03-1975 PCP: Orma Flaming, MD     Subjective:  Chief Complaint  Patient presents with  . Back Pain  . pain in lower 1/2 of body    HPI: The patient is a 43 y.o. female who presents today for lower back pain. She thinks pain started at least 6 months ago. She thought she had just strained it at the beginning from cooking a lot. Pain is in her hips, sacral area, knees, ankles. Pain is not in all of these places at once. Pain rated as a 1-2/10, but will be worse at bedtime. She thinks pain is more in the muscles except for her hips. Pain described as sore, like after exercising. Nothing has made it better, she has never really tried otc medication. She has not really stretched. She does drink a good amount of water. She is walking 30 minutes of walking daily. No complaints of joint pain or soreness in her upper body. No numbness/tinlgikng or weakness in her legs. Nothing makes it worse. No family hx of rheumatoid arthritis. She has some fatigue, but feels much better over the last 3 months.   Pain in vaginal/anal area as well like above. No pain with sex, no vaginal discharge and denies any prolapsed organs.   Review of Systems  Constitutional: Negative for chills, fatigue and fever.  HENT: Negative for congestion, postnasal drip, rhinorrhea and sore throat.   Respiratory: Negative for cough, chest tightness and shortness of breath.   Cardiovascular: Negative for chest pain, palpitations and leg swelling.  Gastrointestinal: Negative for abdominal pain, diarrhea, nausea and vomiting.  Endocrine: Negative for polydipsia and polyuria.  Genitourinary: Positive for vaginal pain.  Musculoskeletal: Positive for back pain.       C/o lower back pain x several months  B/l hip pain, legs/ankles/feet pain x several months  Neurological: Positive for headaches. Negative for dizziness.  Psychiatric/Behavioral: Negative for sleep disturbance.     Allergies Patient has No Known Allergies.  Past Medical History Patient  has a past medical history of Hypertension and Pre-diabetes.  Surgical History Patient  has a past surgical history that includes Cesarean section and Dilation and curettage, diagnostic / therapeutic.  Family History Pateint's family history includes Heart attack in her father; Hypertension in her father.  Social History Patient  reports that she has never smoked. She has never used smokeless tobacco. She reports previous alcohol use. She reports that she does not use drugs.    Objective: Vitals:   10/31/18 0836  BP: 136/90  Pulse: 76  Temp: 97.9 F (36.6 C)  TempSrc: Skin  SpO2: 97%  Weight: 153 lb 12.8 oz (69.8 kg)  Height: 4\' 11"  (1.499 m)    Body mass index is 31.06 kg/m.  Physical Exam Vitals signs reviewed.  Constitutional:      Appearance: Normal appearance. She is obese.  Pulmonary:     Effort: Pulmonary effort is normal.  Abdominal:     General: Abdomen is flat. Bowel sounds are normal.     Palpations: Abdomen is soft.  Musculoskeletal: Normal range of motion.        General: No swelling or tenderness.     Right lower leg: No edema.     Left lower leg: No edema.     Comments: Strength 5/5 in bilateral lower extremities and 5/5 in hip abduction and adduction. She has some point tenderness to palpation on her sacral area and bilateral hips. All other trigger points for  fibro are negative. Joints are normal with no erythema/edema and full range of motion. No pain in ankles/knee or lower back. Sensation intact. Gait normal   Skin:    General: Skin is warm and dry.  Neurological:     General: No focal deficit present.     Mental Status: She is alert and oriented to person, place, and time.     Sensory: No sensory deficit.     Motor: No weakness.     Coordination: Coordination normal.     Gait: Gait normal.     Deep Tendon Reflexes: Reflexes normal.    Xrays: future, we are not  able to do film today. She will return.     Assessment/plan: 1. Bilateral hip pain Large differential: arthritis, poor shoes, poor posture, weak core, back issue affecting hips, fibromyalgia (even though no other pain in trigger areas. Checking xray/labs to rule out RA, lymes or other issue. Start stretching, proper shoes, increasing core exercises and get a back brace for long standing activities. Will f/u with films and see back in 3 months or sooner if needed.  - DG Hip Unilat W OR W/O Pelvis 2-3 Views Left; Future - DG Hip Unilat W OR W/O Pelvis 2-3 Views Right; Future  2. Arthralgia, unspecified joint See above. Baclofen prn for possible muscle related vs. Fibro. Side effects discussed. F/u in 3 months.  - ANA - Rheumatoid factor - Sedimentation rate - C-reactive protein - B. burgdorfi antibodies by WB - DG Lumbar Spine Complete; Future  3. Annual physical exam LDL was not added on her routine lab work for some reason. Will add today.  - Lipid panel   Return in about 3 months (around 01/30/2019) for check up on pain (sooner if needed ) .   Orma Flaming, MD [place]   10/31/2018

## 2018-10-31 NOTE — Patient Instructions (Signed)
1) ruling out any joint issue with labs like rheumatoid arthritis or auto immune issue 2) looking at xrays to make sure no arthritis or other abnormality in the hip 3) starting you on muscle relaxer to take as needed for pain.   4) other thought are poor core strength (abdominal muscle) will really affect lower back. Start working on strengthening these 5) get back brace to wear at home when standing for long periods of time 6) make sure your shoes are really supportive.   Myofascial Pain Syndrome and Fibromyalgia Myofascial pain syndrome and fibromyalgia are both pain disorders. This pain may be felt mainly in your muscles.  Myofascial pain syndrome: ? Always has tender points in the muscle that will cause pain when pressed (trigger points). The pain may come and go. ? Usually affects your neck, upper back, and shoulder areas. The pain often radiates into your arms and hands.  Fibromyalgia: ? Has muscle pains and tenderness that come and go. ? Is often associated with fatigue and sleep problems. ? Has trigger points. ? Tends to be long-lasting (chronic), but is not life-threatening. Fibromyalgia and myofascial pain syndrome are not the same. However, they often occur together. If you have both conditions, each can make the other worse. Both are common and can cause enough pain and fatigue to make day-to-day activities difficult. Both can be hard to diagnose because their symptoms are common in many other conditions. What are the causes? The exact causes of these conditions are not known. What increases the risk? You are more likely to develop this condition if:  You have a family history of the condition.  You have certain triggers, such as: ? Spine disorders. ? An injury (trauma) or other physical stressors. ? Being under a lot of stress. ? Medical conditions such as osteoarthritis, rheumatoid arthritis, or lupus. What are the signs or symptoms? Fibromyalgia The main symptom of  fibromyalgia is widespread pain and tenderness in your muscles. Pain is sometimes described as stabbing, shooting, or burning. You may also have:  Tingling or numbness.  Sleep problems and fatigue.  Problems with attention and concentration (fibro fog). Other symptoms may include:  Bowel and bladder problems.  Headaches.  Visual problems.  Problems with odors and noises.  Depression or mood changes.  Painful menstrual periods (dysmenorrhea).  Dry skin or eyes. These symptoms can vary over time. Myofascial pain syndrome Symptoms of myofascial pain syndrome include:  Tight, ropy bands of muscle.  Uncomfortable sensations in muscle areas. These may include aching, cramping, burning, numbness, tingling, and weakness.  Difficulty moving certain parts of the body freely (poor range of motion). How is this diagnosed? This condition may be diagnosed by your symptoms and medical history. You will also have a physical exam. In general:  Fibromyalgia is diagnosed if you have pain, fatigue, and other symptoms for more than 3 months, and symptoms cannot be explained by another condition.  Myofascial pain syndrome is diagnosed if you have trigger points in your muscles, and those trigger points are tender and cause pain elsewhere in your body (referred pain). How is this treated? Treatment for these conditions depends on the type that you have.  For fibromyalgia: ? Pain medicines, such as NSAIDs. ? Medicines for treating depression. ? Medicines for treating seizures. ? Medicines that relax the muscles.  For myofascial pain: ? Pain medicines, such as NSAIDs. ? Cooling and stretching of muscles. ? Trigger point injections. ? Sound wave (ultrasound) treatments to stimulate muscles. Treating these  conditions often requires a team of health care providers. These may include:  Your primary care provider.  Physical therapist.  Complementary health care providers, such as massage  therapists or acupuncturists.  Psychiatrist for cognitive behavioral therapy. Follow these instructions at home: Medicines  Take over-the-counter and prescription medicines only as told by your health care provider.  Do not drive or use heavy machinery while taking prescription pain medicine.  If you are taking prescription pain medicine, take actions to prevent or treat constipation. Your health care provider may recommend that you: ? Drink enough fluid to keep your urine pale yellow. ? Eat foods that are high in fiber, such as fresh fruits and vegetables, whole grains, and beans. ? Limit foods that are high in fat and processed sugars, such as fried or sweet foods. ? Take an over-the-counter or prescription medicine for constipation. Lifestyle   Exercise as directed by your health care provider or physical therapist.  Practice relaxation techniques to control your stress. You may want to try: ? Biofeedback. ? Visual imagery. ? Hypnosis. ? Muscle relaxation. ? Yoga. ? Meditation.  Maintain a healthy lifestyle. This includes eating a healthy diet and getting enough sleep.  Do not use any products that contain nicotine or tobacco, such as cigarettes and e-cigarettes. If you need help quitting, ask your health care provider. General instructions  Talk to your health care provider about complementary treatments, such as acupuncture or massage.  Consider joining a support group with others who are diagnosed with this condition.  Do not do activities that stress or strain your muscles. This includes repetitive motions and heavy lifting.  Keep all follow-up visits as told by your health care provider. This is important. Where to find more information  National Fibromyalgia Association: www.fmaware.[place]: www.arthritis.org  American Chronic Pain Association: www.theacpa.org Contact a health care provider if:  You have new symptoms.  Your symptoms get  worse or your pain is severe.  You have side effects from your medicines.  You have trouble sleeping.  Your condition is causing depression or anxiety. Summary  Myofascial pain syndrome and fibromyalgia are pain disorders.  Myofascial pain syndrome has tender points in the muscle that will cause pain when pressed (trigger points). Fibromyalgia also has muscle pains and tenderness that come and go, but this condition is often associated with fatigue and sleep disturbances.  Fibromyalgia and myofascial pain syndrome are not the same but often occur together, causing pain and fatigue that make day-to-day activities difficult.  Treatment for fibromyalgia includes taking medicines to relax the muscles and medicines for pain, depression, or seizures. Treatment for myofascial pain syndrome includes taking medicines for pain, cooling and stretching of muscles, and injecting medicines into trigger points.  Follow your health care provider's instructions for taking medicines and maintaining a healthy lifestyle. This information is not intended to replace advice given to you by your health care provider. Make sure you discuss any questions you have with your health care provider. Document Released: 01/22/2005 Document Revised: 05/16/2018 Document Reviewed: 02/06/2017 Elsevier Patient Education  2020 [employer].

## 2018-11-03 ENCOUNTER — Other Ambulatory Visit: Payer: BC Managed Care – PPO

## 2018-11-03 ENCOUNTER — Other Ambulatory Visit: Payer: Self-pay

## 2018-11-03 ENCOUNTER — Ambulatory Visit (INDEPENDENT_AMBULATORY_CARE_PROVIDER_SITE_OTHER): Payer: BC Managed Care – PPO

## 2018-11-03 DIAGNOSIS — M1612 Unilateral primary osteoarthritis, left hip: Secondary | ICD-10-CM | POA: Diagnosis not present

## 2018-11-03 DIAGNOSIS — M25551 Pain in right hip: Secondary | ICD-10-CM | POA: Diagnosis not present

## 2018-11-03 DIAGNOSIS — M25552 Pain in left hip: Secondary | ICD-10-CM | POA: Diagnosis not present

## 2018-11-03 DIAGNOSIS — M2550 Pain in unspecified joint: Secondary | ICD-10-CM | POA: Diagnosis not present

## 2018-11-03 DIAGNOSIS — M545 Low back pain: Secondary | ICD-10-CM | POA: Diagnosis not present

## 2018-11-03 DIAGNOSIS — M1611 Unilateral primary osteoarthritis, right hip: Secondary | ICD-10-CM | POA: Diagnosis not present

## 2018-11-03 IMAGING — DX DG HIP (WITH OR WITHOUT PELVIS) 2-3V*R*
3 series · 3 of 3 positions shown · non-contrast
Comparison: None

CLINICAL DATA: Sacral and bilateral hip pain

EXAM:
DG HIP (WITH OR WITHOUT PELVIS) 2-3V RIGHT

[pelvis ap]
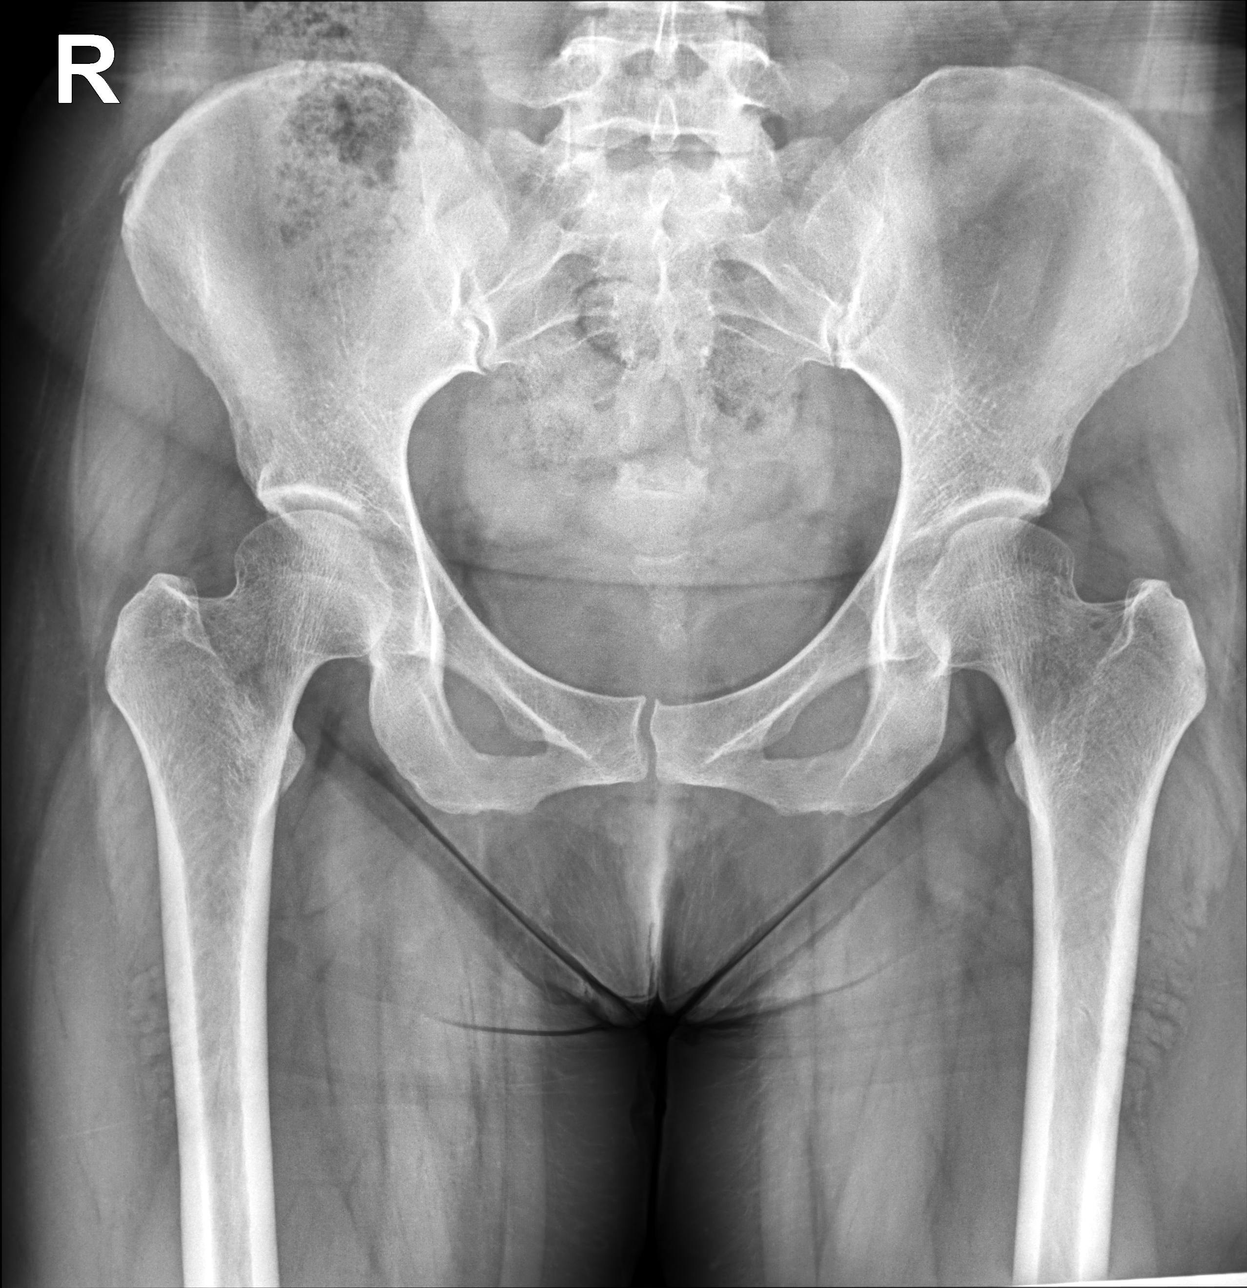

[hip joint ap]
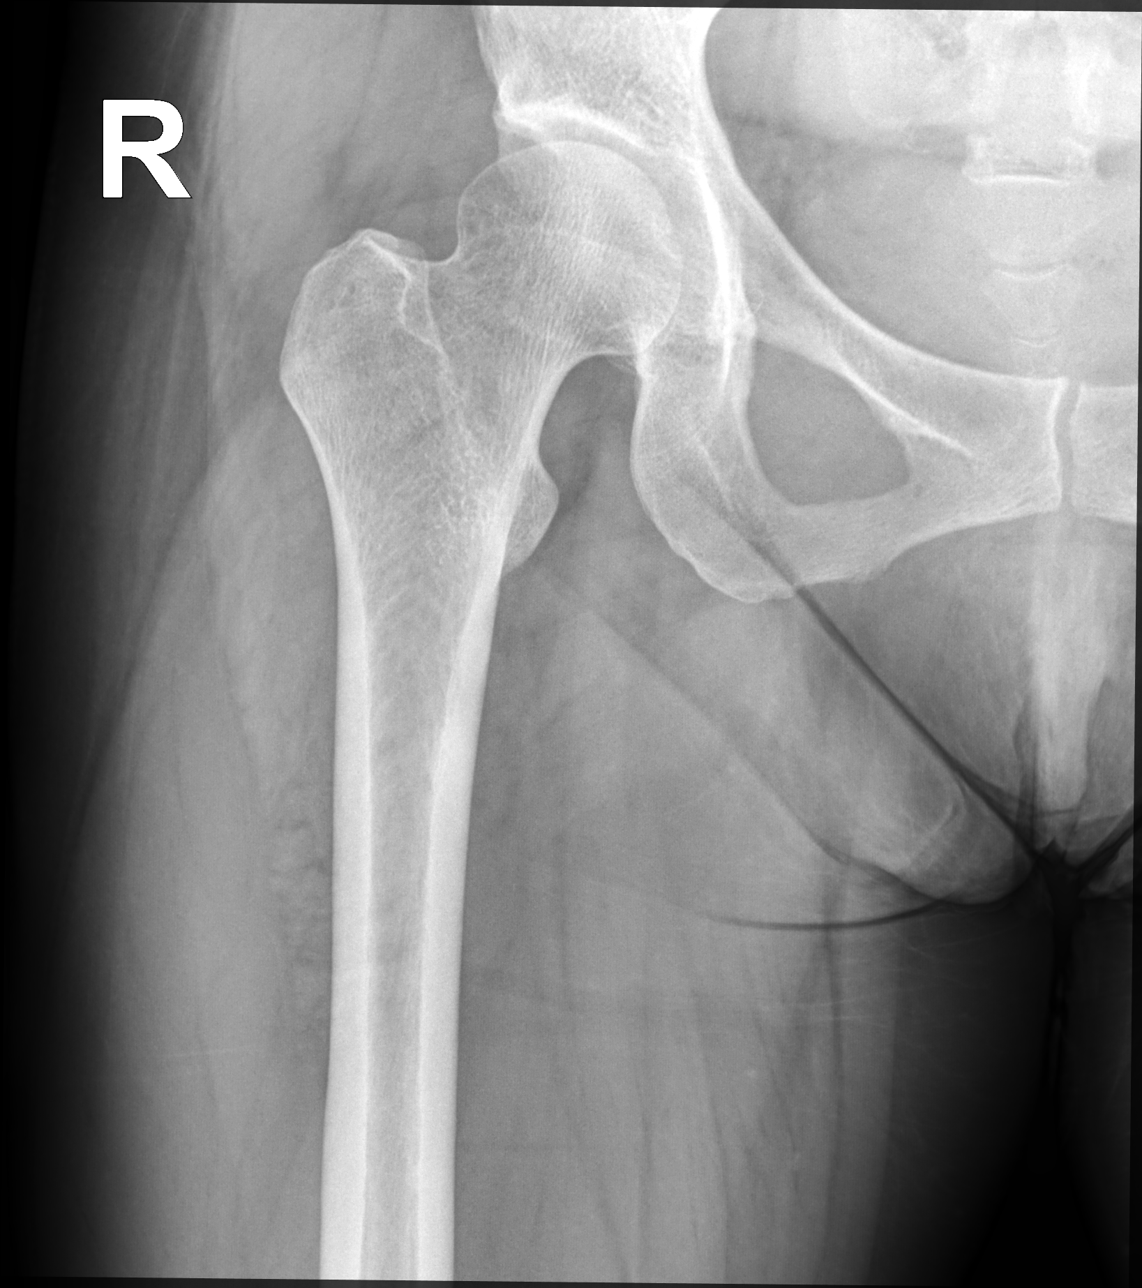

[hip joint (frog view)]
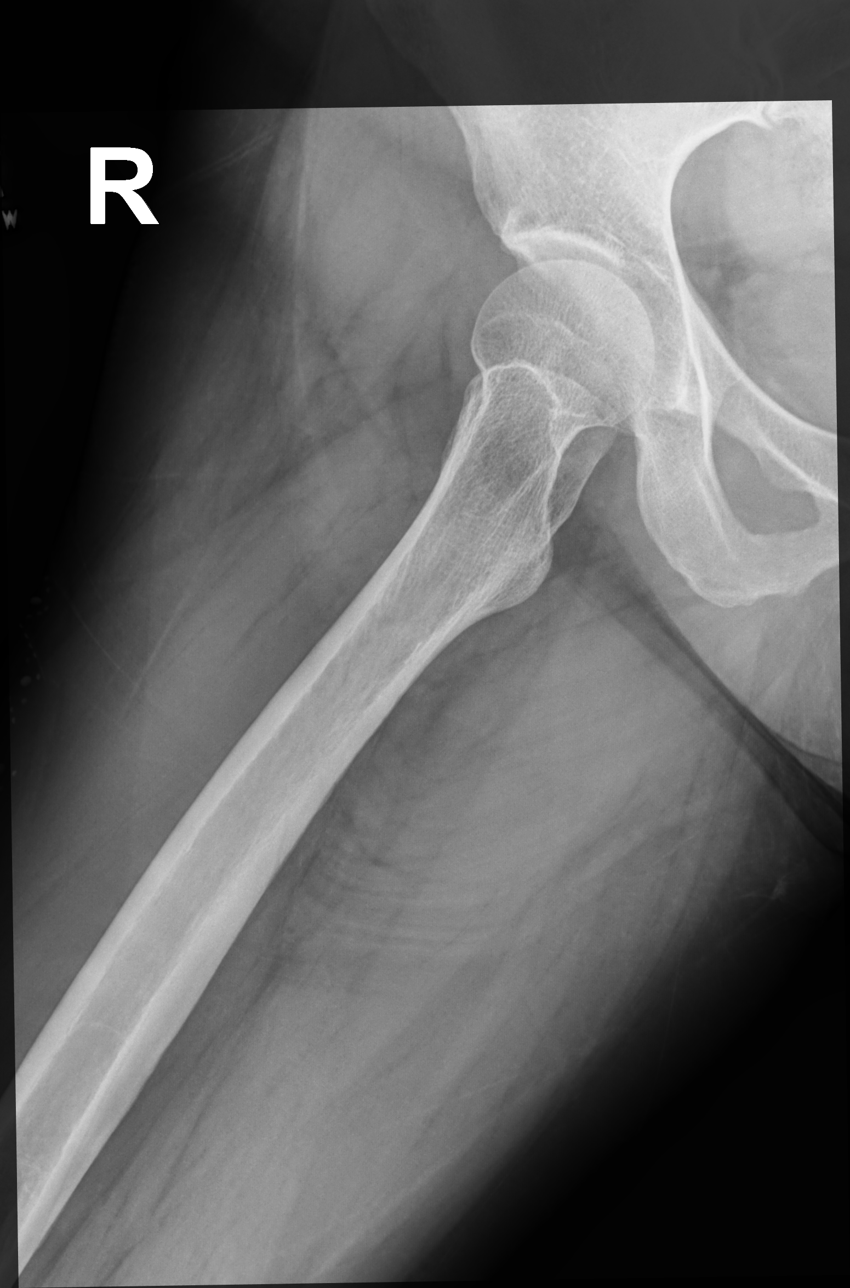

[3 of 3 positions shown; findings below may reference images not displayed]

FINDINGS: Bones of the pelvis are congruent without abnormal diastatic
widening of the SI joints or symphysis pubis. Both femoral heads are
normally located. Proximal right femur is intact. Minimal
degenerative changes are present in the right hip, similar in extent
to those seen in the left as well. Early enthesopathic changes are
noted on the iliac crests. Soft tissues are unremarkable.
IMPRESSION: Minimal degenerative changes in the right hip, similar in extent to
those seen in the left as well.

No acute osseous abnormality.

## 2018-11-03 IMAGING — DX DG HIP (WITH OR WITHOUT PELVIS) 2-3V*L*
2 series · 2 of 2 positions shown · non-contrast
Comparison: None.

CLINICAL DATA: Sacral and bilateral hip pain

EXAM:
DG HIP (WITH OR WITHOUT PELVIS) 2-3V LEFT

[hip joint ap]
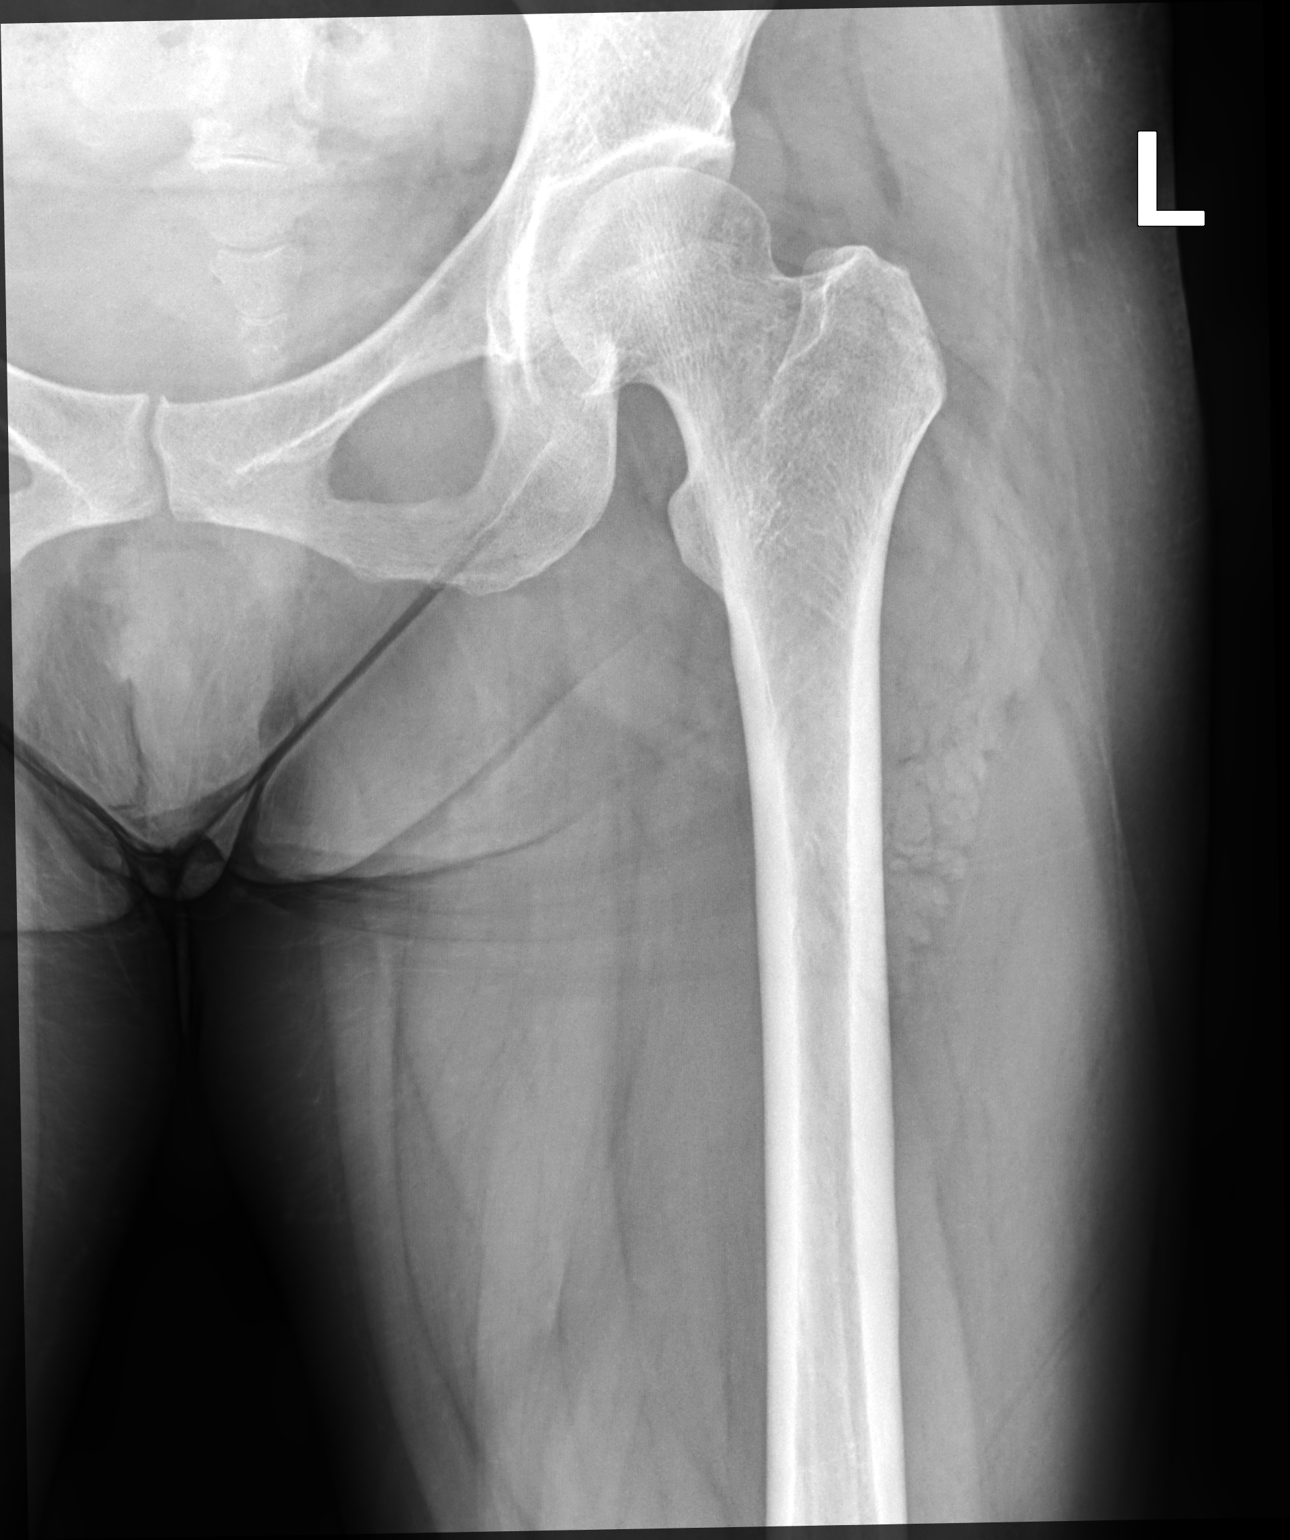

[hip joint (frog view)]
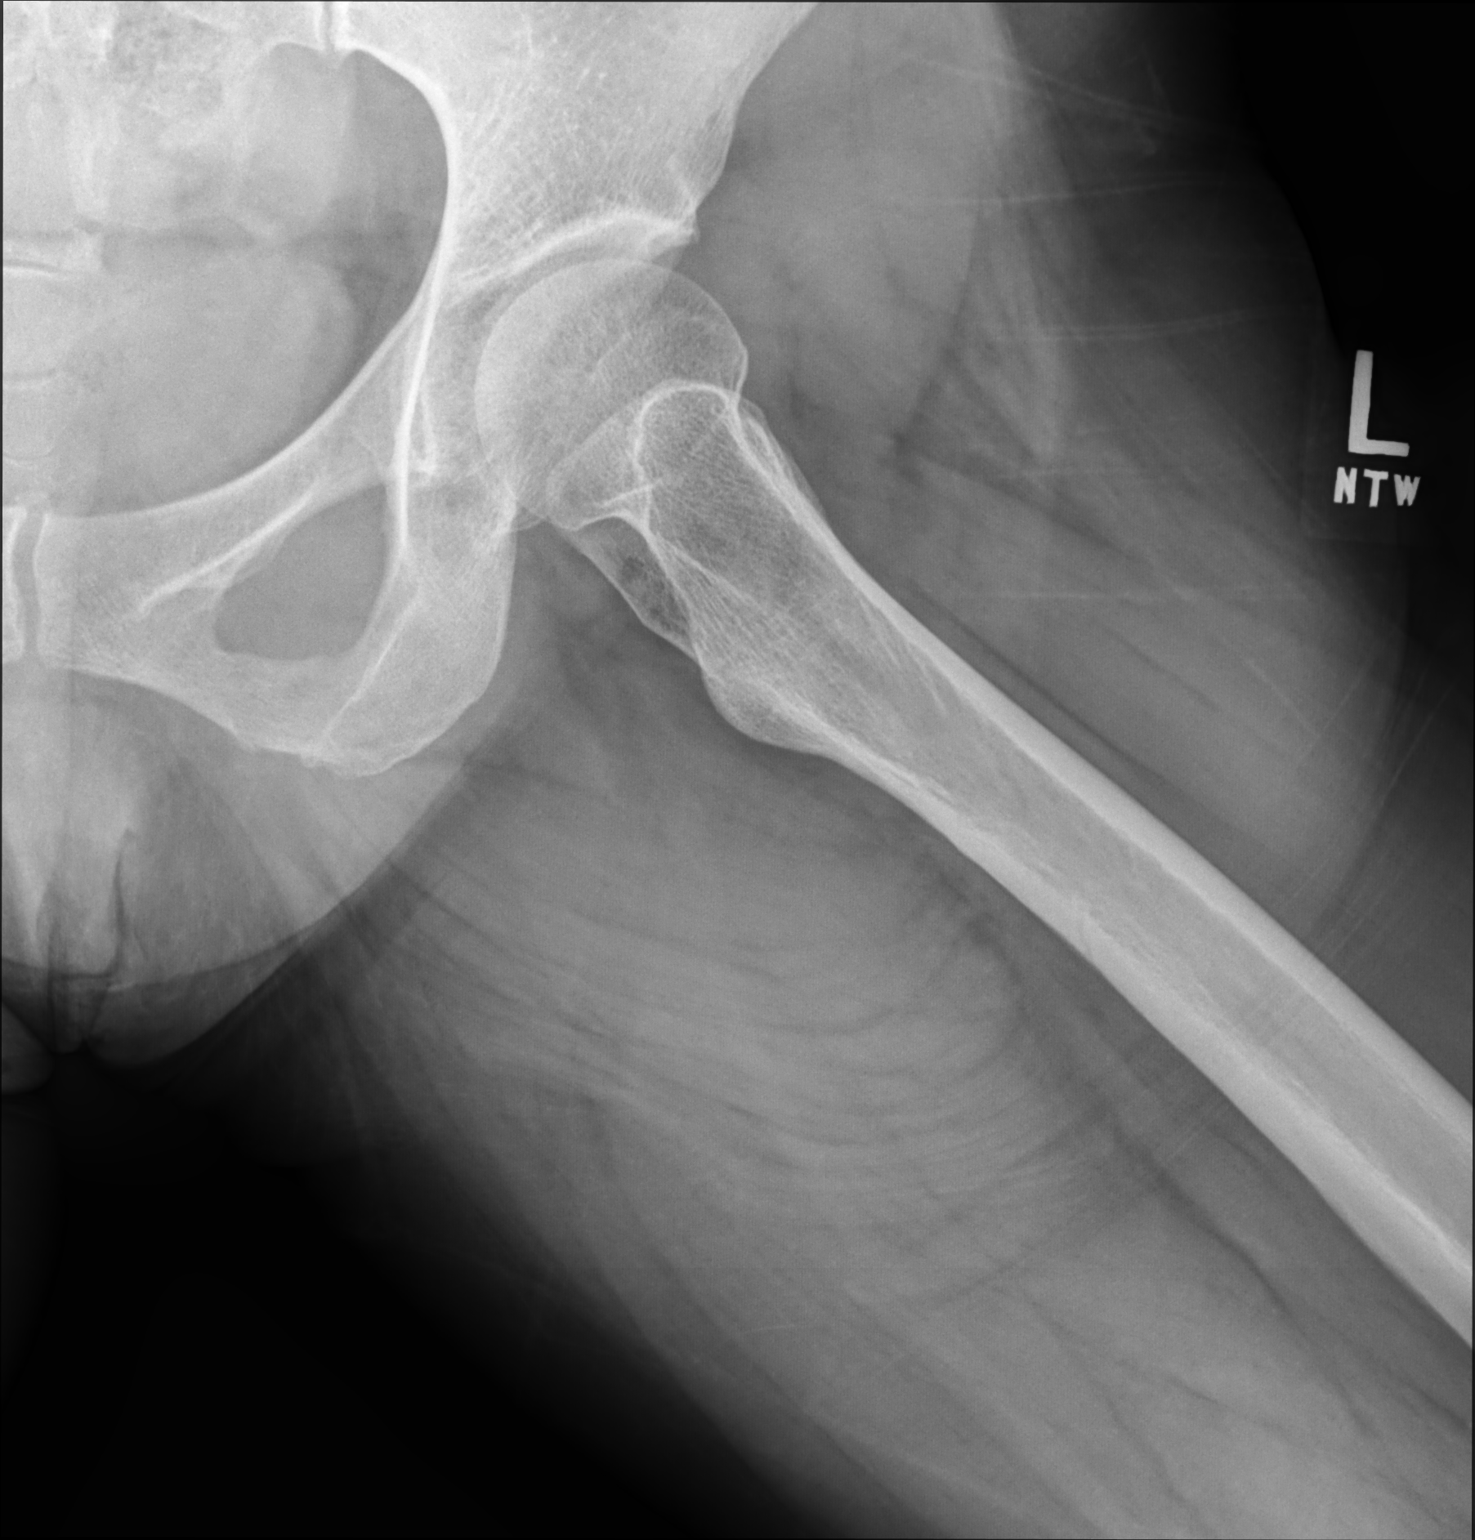

[2 of 2 positions shown; findings below may reference images not displayed]

FINDINGS: There is no evidence of hip fracture or dislocation. Minimal
acetabular spurring. No other focal bone abnormality.
IMPRESSION: Minimal hip arthrosis.

No acute osseous abnormality.

## 2018-11-03 IMAGING — DX DG LUMBAR SPINE COMPLETE 4+V
5 series · 5 of 5 positions shown · non-contrast
Comparison: None.

CLINICAL DATA: pain

EXAM:
LUMBAR SPINE - COMPLETE 4+ VIEW

[lumbar spine ap]
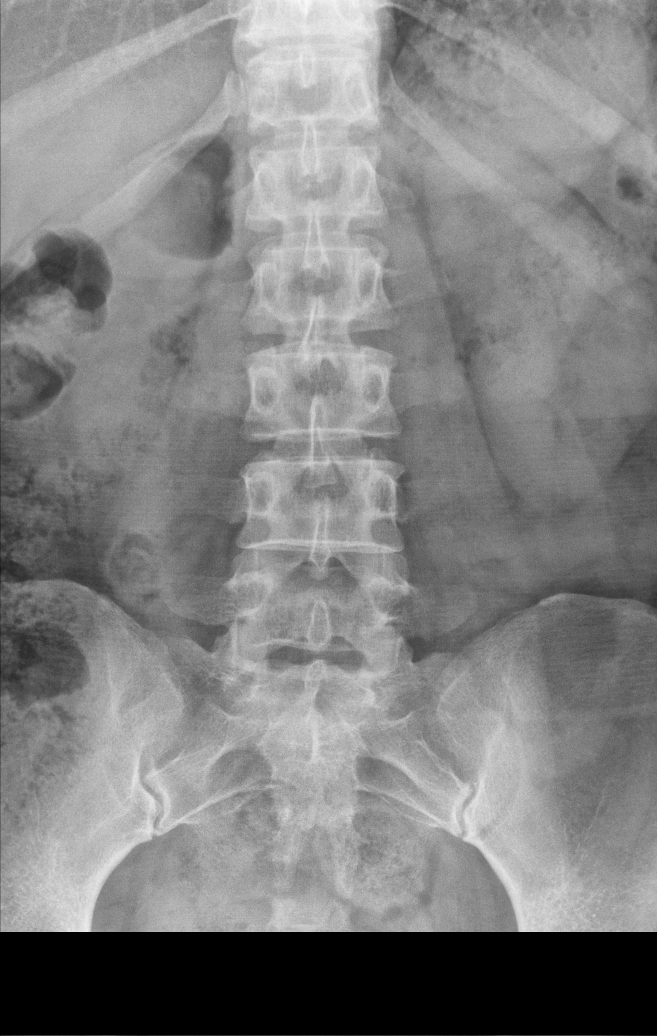

[lumbar spine oblique (1 of 2)]
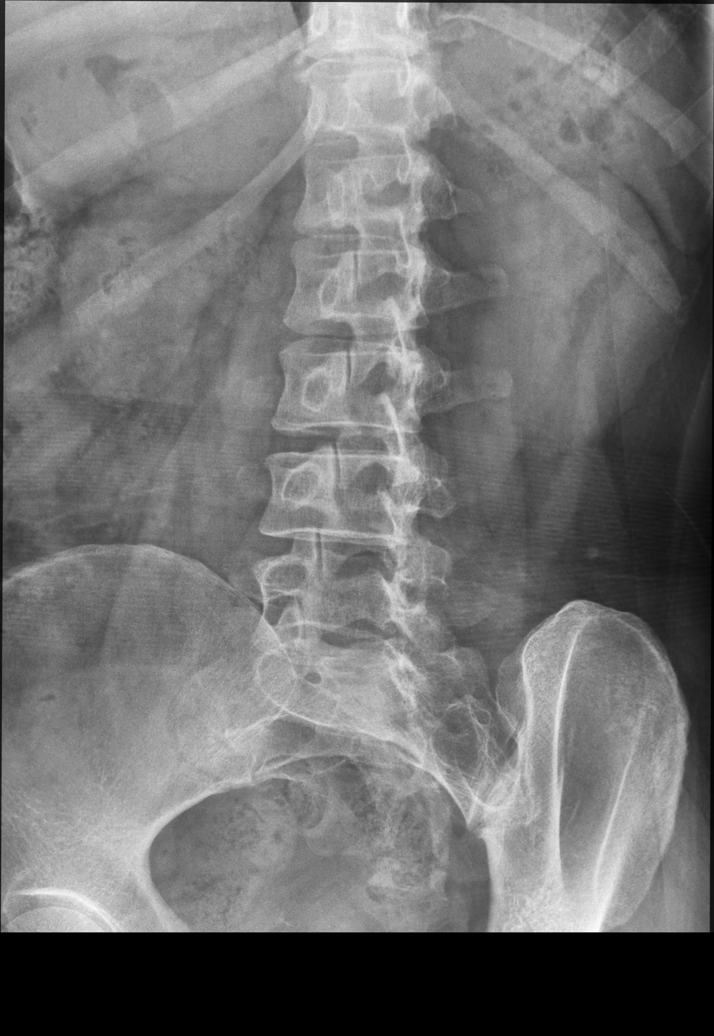

[lumbar spine oblique (2 of 2)]
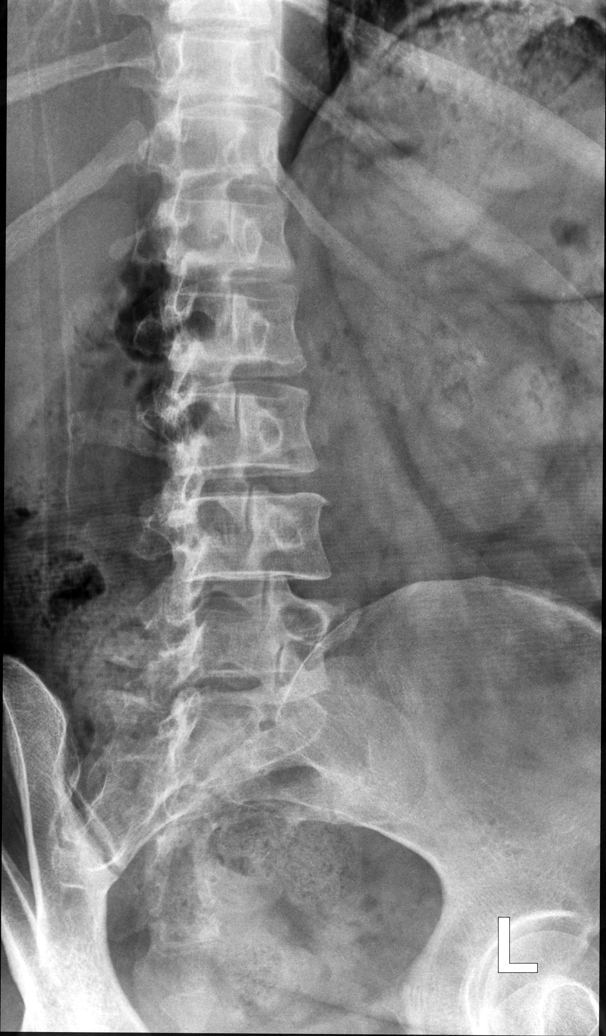

[lumbar spine lat (1 of 2)]
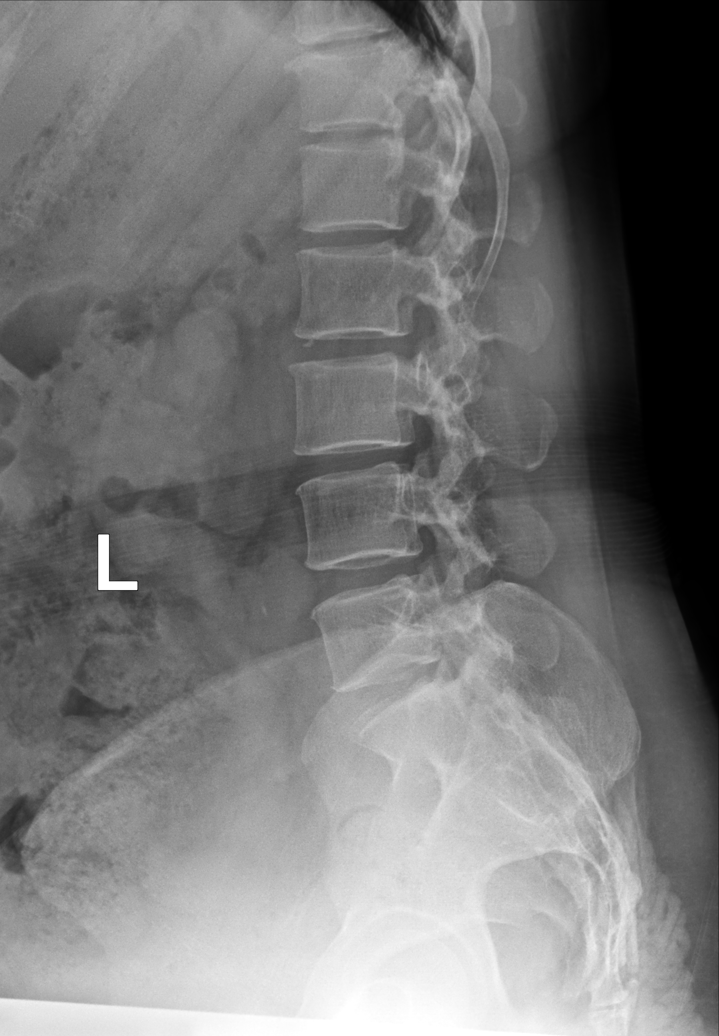

[lumbar spine lat (2 of 2)]
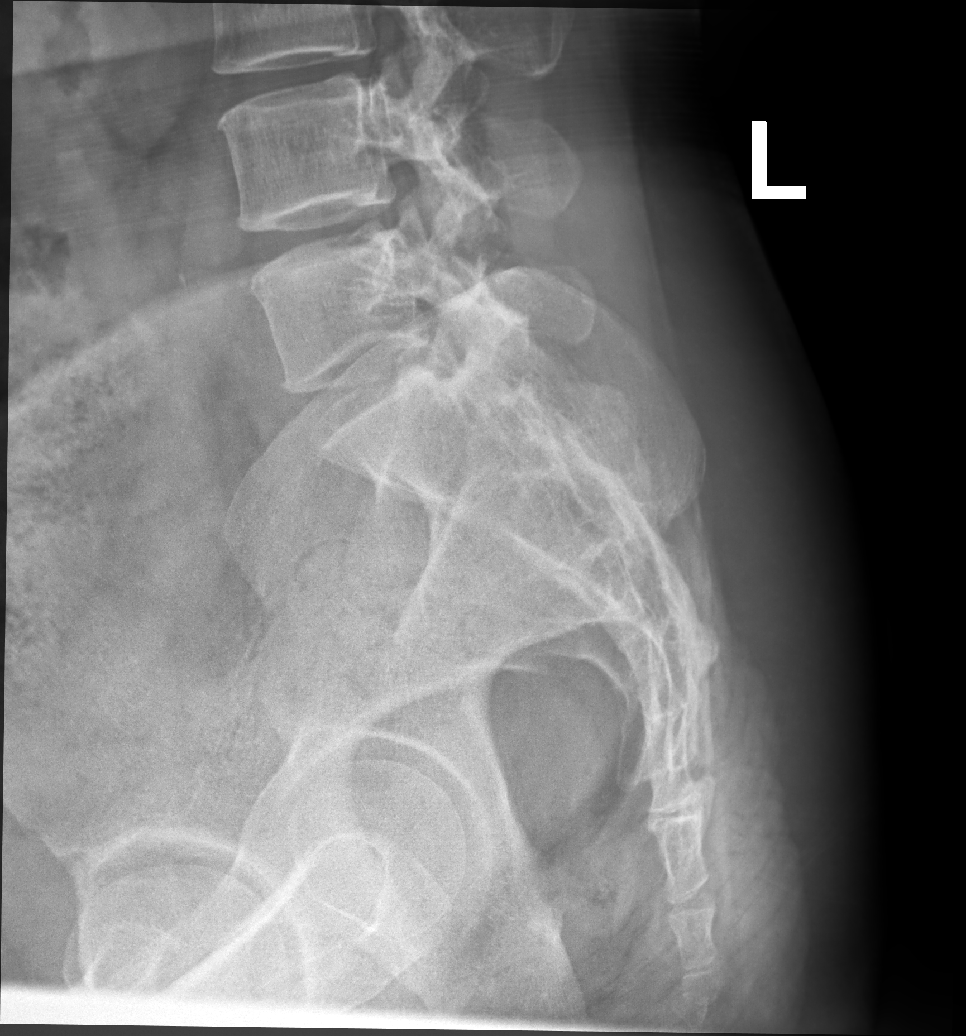

[5 of 5 positions shown; findings below may reference images not displayed]

FINDINGS: Normal spinal alignment. Vertebral body heights are maintained
without evidence of fracture. Intervertebral disc spaces are
maintained. Minimal anterior spurring. No focal bony lesion.
Sacroiliac joints are open. Nonobstructive bowel gas pattern.
IMPRESSION: No acute osseous abnormality in the lumbar spine.

## 2018-11-04 LAB — B. BURGDORFI ANTIBODIES BY WB
B burgdorferi IgG Abs (IB): NEGATIVE
B burgdorferi IgM Abs (IB): NEGATIVE
Lyme Disease 18 kD IgG: NONREACTIVE
Lyme Disease 23 kD IgG: NONREACTIVE
Lyme Disease 23 kD IgM: NONREACTIVE
Lyme Disease 28 kD IgG: NONREACTIVE
Lyme Disease 30 kD IgG: NONREACTIVE
Lyme Disease 39 kD IgG: NONREACTIVE
Lyme Disease 39 kD IgM: REACTIVE — AB
Lyme Disease 41 kD IgG: REACTIVE — AB
Lyme Disease 41 kD IgM: NONREACTIVE
Lyme Disease 45 kD IgG: REACTIVE — AB
Lyme Disease 58 kD IgG: NONREACTIVE
Lyme Disease 66 kD IgG: NONREACTIVE
Lyme Disease 93 kD IgG: NONREACTIVE

## 2018-11-04 LAB — ANA: Anti Nuclear Antibody (ANA): POSITIVE — AB

## 2018-11-04 LAB — ANTI-NUCLEAR AB-TITER (ANA TITER): ANA Titer 1: 1:160 {titer} — ABNORMAL HIGH

## 2018-11-04 LAB — RHEUMATOID FACTOR: Rheumatoid fact SerPl-aCnc: <14 IU/mL (ref <14)

## 2018-11-05 ENCOUNTER — Ambulatory Visit: Payer: BC Managed Care – PPO | Admitting: Family Medicine

## 2018-11-06 ENCOUNTER — Other Ambulatory Visit: Payer: Self-pay | Admitting: Family Medicine

## 2018-11-06 ENCOUNTER — Encounter: Payer: Self-pay | Admitting: Family Medicine

## 2018-11-06 DIAGNOSIS — R768 Other specified abnormal immunological findings in serum: Secondary | ICD-10-CM

## 2018-11-07 ENCOUNTER — Ambulatory Visit: Payer: BC Managed Care – PPO | Admitting: Family Medicine

## 2018-11-07 NOTE — Progress Notes (Signed)
Office Visit Note  Patient: Joanne Jackson             Date of Birth: Feb 27, 1975           MRN: [id number]             PCP: Orma Flaming, MD Referring: Orma Flaming, MD Visit Date: 11/19/2018 Occupation: '@GUAROCC'$ @  Subjective:  Muscle pain and positive ANA.   History of Present Illness: Joanne Jackson is a 43 y.o. female seen in consultation per request of Dr. Eliberto Ivory.  According to patient March 2020 after the pandemic hit her family was mostly home and she had to do more housework and cooking.  She states she was standing for prolonged time and was experiencing pain in her lower extremities.  She initially thought it was related to prolonged standing.  She tried to cut back on her work but did not notice any improvement.  She describes pain and discomfort in her perineal area, gluteal area, her hips, legs and feet.  She states the muscle pain is prominent.  Although she also has some discomfort in her knees and ankles as well.  She notices some numbness in her hands in the morning.  She denies discomfort in her upper body.  She states she took some ibuprofen for the last few days which helped her discomfort.  There is no family history of autoimmune disease.  Activities of Daily Living:  Patient reports morning stiffness for 5 minutes.   Patient Denies nocturnal pain.  Difficulty dressing/grooming: Denies Difficulty climbing stairs: Denies Difficulty getting out of chair: Denies Difficulty using hands for taps, buttons, cutlery, and/or writing: Denies  Review of Systems  Constitutional: Negative for fatigue, night sweats, weight gain and weight loss.  HENT: Positive for mouth dryness. Negative for mouth sores, trouble swallowing, trouble swallowing and nose dryness.   Eyes: Negative for pain, redness, itching, visual disturbance and dryness.  Respiratory: Negative for cough, shortness of breath, wheezing and difficulty breathing.   Cardiovascular: Negative for chest pain, palpitations,  hypertension, irregular heartbeat and swelling in legs/feet.  Gastrointestinal: Negative for abdominal pain, blood in stool, constipation and diarrhea.  Endocrine: Negative for increased urination.  Genitourinary: Positive for nocturia. Negative for difficulty urinating, painful urination and vaginal dryness.  Musculoskeletal: Positive for arthralgias, joint pain, myalgias, morning stiffness, muscle tenderness and myalgias. Negative for joint swelling and muscle weakness.  Skin: Negative for color change, rash, hair loss, skin tightness, ulcers and sensitivity to sunlight.  Allergic/Immunologic: Negative for susceptible to infections.  Neurological: Positive for numbness. Negative for dizziness, light-headedness, headaches, memory loss, night sweats and weakness.  Hematological: Negative for bruising/bleeding tendency and swollen glands.  Psychiatric/Behavioral: Positive for sleep disturbance. Negative for depressed mood and confusion. The patient is not nervous/anxious.     PMFS History:  Patient Active Problem List   Diagnosis Date Noted   Tubular adenoma of colon 10/24/2018   Essential hypertension 12/26/2017   Prediabetes 12/26/2017    Past Medical History:  Diagnosis Date   Hypertension    Pre-diabetes     Family History  Problem Relation Age of Onset   Thyroid disease Mother    Heart attack Father    Hypertension Father    Healthy Brother    Healthy Daughter    Colon cancer Neg Hx    Past Surgical History:  Procedure Laterality Date   CESAREAN SECTION     DILATION AND CURETTAGE, DIAGNOSTIC / THERAPEUTIC     Social History   Social History  Narrative   Not on file   Immunization History  Administered Date(s) Administered   Influenza,inj,Quad PF,6+ Mos 10/24/2018     Objective: Vital Signs: BP (!) 145/101 (BP Location: Right Arm, Patient Position: Sitting, Cuff Size: Normal)    Pulse 75    Resp 12    Ht 5' (1.524 m)    Wt 151 lb 3.2 oz (68.6 kg)     LMP 11/08/2018    BMI 29.53 kg/m    Physical Exam Vitals signs and nursing note reviewed.  Constitutional:      Appearance: She is well-developed.  HENT:     Head: Normocephalic and atraumatic.  Eyes:     Conjunctiva/sclera: Conjunctivae normal.  Neck:     Musculoskeletal: Normal range of motion.  Cardiovascular:     Rate and Rhythm: Normal rate and regular rhythm.     Heart sounds: Normal heart sounds.  Pulmonary:     Effort: Pulmonary effort is normal.     Breath sounds: Normal breath sounds.  Abdominal:     General: Bowel sounds are normal.     Palpations: Abdomen is soft.  Lymphadenopathy:     Cervical: No cervical adenopathy.  Skin:    General: Skin is warm and dry.     Capillary Refill: Capillary refill takes less than 2 seconds.  Neurological:     Mental Status: She is alert and oriented to person, place, and time.  Psychiatric:        Behavior: Behavior normal.      Musculoskeletal Exam: C-spine thoracic and lumbar spine were in good range of motion.  There was no SI joint tenderness.  Shoulder joints, elbow joints, wrist joints, MCPs PIPs and DIPs been good range of motion with no synovitis.  Tinel's and Phalen's were negative.  Hip joints, knee joints, ankles MTPs PIPs been good range of motion with no synovitis.  She had no muscle tenderness on palpation.  CDAI Exam: CDAI Score: -- Patient Global: --; Provider Global: -- Swollen: --; Tender: -- Joint Exam   No joint exam has been documented for this visit   There is currently no information documented on the homunculus. Go to the Rheumatology activity and complete the homunculus joint exam.  Investigation: No additional findings. Component     Latest Ref Rng & Units 10/24/2018 10/31/2018  B burgdorferi IgG Abs (IB)     NEGATIVE  NEGATIVE  Lyme Disease 18 kD IgG       NON-REACTIVE  Lyme Disease 23 kD IgG       NON-REACTIVE  Lyme Disease 28 kD IgG       NON-REACTIVE  Lyme Disease 30 kD IgG        NON-REACTIVE  Lyme Disease 39 kD IgG       NON-REACTIVE  Lyme Disease 41 kD IgG       REACTIVE (A)  Lyme Disease 45 kD IgG       REACTIVE (A)  Lyme Disease 58 kD IgG       NON-REACTIVE  Lyme Disease 66 kD IgG       NON-REACTIVE  Lyme Disease 93 kD IgG       NON-REACTIVE  B burgdorferi IgM Abs (IB)     NEGATIVE  NEGATIVE  Lyme Disease 23 kD IgM       NON-REACTIVE  Lyme Disease 39 kD IgM       REACTIVE (A)  Lyme Disease 41 kD IgM       NON-REACTIVE  Microalb,  Ur     0.0 - 1.9 mg/dL <0.7   Creatinine,U     mg/dL 49.7   MICROALB/CREAT RATIO     0.0 - 30.0 mg/g 1.4   ANA Titer 1     titer  1:160 (H)  ANA Pattern 1       Nuclear, Speckled (A)  TSH     0.35 - 4.50 uIU/mL 0.75   HIV     NON-REACTI NON-REACTIVE   Anti Nuclear Antibody (ANA)     NEGATIVE  POSITIVE (A)  RA Latex Turbid.     <14 IU/mL  <14  Sed Rate     0 - 20 mm/hr  26 (H)  CRP     0.5 - 20.0 mg/dL  <1.0   Imaging: Dg Lumbar Spine Complete  Result Date: 11/03/2018 CLINICAL DATA:  pain EXAM: LUMBAR SPINE - COMPLETE 4+ VIEW COMPARISON:  None. FINDINGS: Normal spinal alignment. Vertebral body heights are maintained without evidence of fracture. Intervertebral disc spaces are maintained. Minimal anterior spurring. No focal bony lesion. Sacroiliac joints are open. Nonobstructive bowel gas pattern. IMPRESSION: No acute osseous abnormality in the lumbar spine. Electronically Signed   By: Audie Pinto M.D.   On: 11/03/2018 21:54   Dg Hip Unilat W Or W/o Pelvis 2-3 Views Left  Result Date: 11/03/2018 CLINICAL DATA:  Sacral and bilateral hip pain EXAM: DG HIP (WITH OR WITHOUT PELVIS) 2-3V LEFT COMPARISON:  None. FINDINGS: There is no evidence of hip fracture or dislocation. Minimal acetabular spurring. No other focal bone abnormality. IMPRESSION: Minimal hip arthrosis. No acute osseous abnormality. Electronically Signed   By: Lovena Le M.D.   On: 11/03/2018 23:36   Dg Hip Unilat W Or W/o Pelvis 2-3 Views  Right  Result Date: 11/03/2018 CLINICAL DATA:  Sacral and bilateral hip pain EXAM: DG HIP (WITH OR WITHOUT PELVIS) 2-3V RIGHT COMPARISON:  None FINDINGS: Bones of the pelvis are congruent without abnormal diastatic widening of the SI joints or symphysis pubis. Both femoral heads are normally located. Proximal right femur is intact. Minimal degenerative changes are present in the right hip, similar in extent to those seen in the left as well. Early enthesopathic changes are noted on the iliac crests. Soft tissues are unremarkable. IMPRESSION: Minimal degenerative changes in the right hip, similar in extent to those seen in the left as well. No acute osseous abnormality. Electronically Signed   By: Lovena Le M.D.   On: 11/03/2018 23:38    Recent Labs: Lab Results  Component Value Date   WBC 5.3 10/24/2018   HGB 13.4 10/24/2018   PLT 257.0 10/24/2018   NA 137 10/24/2018   K 4.2 10/24/2018   CL 103 10/24/2018   CO2 25 10/24/2018   GLUCOSE 86 10/24/2018   BUN 9 10/24/2018   CREATININE 0.57 10/24/2018   BILITOT 0.6 10/24/2018   ALKPHOS 72 10/24/2018   AST 15 10/24/2018   ALT 13 10/24/2018   PROT 7.3 10/24/2018   ALBUMIN 4.3 10/24/2018   CALCIUM 9.5 10/24/2018    Speciality Comments: No specialty comments available.  Procedures:  No procedures performed Allergies: Patient has no known allergies.   Assessment / Plan:     Visit Diagnoses: Myalgia -patient complains of muscle pain in her lower extremities.  She states she has pain in the perineal region, gluteal region, her calves and her ankles.  She also has some joint pain.  I will obtain following labs today.  She has no lower back pain and  had good mobility.  Plan: Sedimentation rate, CK, TSH, VITAMIN D 25 Hydroxy (Vit-D Deficiency, Fractures)  Chronic pain of both knees -I offered x-rays but she declined.  She states she has discomfort off and on.  Plan: Cyclic citrul peptide antibody, IgG, HLA-B27 antigen  Paresthesia of both  hands-probably due to carpal tunnel syndrome.  Phalen's and Tinel's test were negative.  Have advised her to use carpal tunnel braces at nighttime.  Prescription was given.  If her symptoms get worse then we can obtain nerve conduction velocities.  Positive ANA (antinuclear antibody) - 10/24/18: TSH 0.75, HIV-10/31/18: ANA 1:160 NS, RF<14, sed rate 26, CRP<1, B burgdorferi Ab- -she has low titer ANA but no obvious features of lupus.  I will obtain complete panel today.  Plan: Anti-scleroderma antibody, RNP Antibody, Anti-Smith antibody, Sjogrens syndrome-A extractable nuclear antibody, Sjogrens syndrome-B extractable nuclear antibody, Anti-DNA antibody, double-stranded, C3 and C4  Essential hypertension-blood pressure is elevated.  Have advised her to monitor blood pressure closely and follow-up with her PCP.  Prediabetes  Tubular adenoma of colon  Orders: Orders Placed This Encounter  Procedures   Sedimentation rate   CK   TSH   VITAMIN D 25 Hydroxy (Vit-D Deficiency, Fractures)   Cyclic citrul peptide antibody, IgG   Anti-scleroderma antibody   RNP Antibody   Anti-Smith antibody   Sjogrens syndrome-A extractable nuclear antibody   Sjogrens syndrome-B extractable nuclear antibody   Anti-DNA antibody, double-stranded   C3 and C4   HLA-B27 antigen   No orders of the defined types were placed in this encounter.   Face-to-face time spent with patient was 45 minutes. Greater than 50% of time was spent in counseling and coordination of care.  Follow-Up Instructions: Return for +ANA, myalgia.   Bo Merino, MD  Note - This record has been created using Editor, commissioning.  Chart creation errors have been sought, but may not always  have been located. Such creation errors do not reflect on  the standard of medical care.

## 2018-11-11 ENCOUNTER — Other Ambulatory Visit: Payer: Self-pay

## 2018-11-12 ENCOUNTER — Encounter: Payer: Self-pay | Admitting: Obstetrics & Gynecology

## 2018-11-12 ENCOUNTER — Encounter: Payer: Self-pay | Admitting: Family Medicine

## 2018-11-12 ENCOUNTER — Ambulatory Visit: Payer: BC Managed Care – PPO | Admitting: Obstetrics & Gynecology

## 2018-11-12 VITALS — BP 140/90 | Ht 61.25 in | Wt 151.0 lb

## 2018-11-12 DIAGNOSIS — Z789 Other specified health status: Secondary | ICD-10-CM | POA: Diagnosis not present

## 2018-11-12 DIAGNOSIS — E663 Overweight: Secondary | ICD-10-CM

## 2018-11-12 DIAGNOSIS — Z01419 Encounter for gynecological examination (general) (routine) without abnormal findings: Secondary | ICD-10-CM

## 2018-11-12 DIAGNOSIS — N946 Dysmenorrhea, unspecified: Secondary | ICD-10-CM | POA: Diagnosis not present

## 2018-11-12 DIAGNOSIS — D219 Benign neoplasm of connective and other soft tissue, unspecified: Secondary | ICD-10-CM | POA: Diagnosis not present

## 2018-11-12 NOTE — Progress Notes (Signed)
Joanne Jackson 04-14-1975 [id number]   History:    43 y.o. G1P1L1 Married.  Daughter 68 yo (IVF)  RP:  New patient presenting for annual gyn exam   HPI:  Menses normal every month.  Menstruating currently, normal flow.  H/O Dysmenorrhea, much better since she had her baby.  No BTB.  No pelvic pain.  No pain with IC.  Using condoms.  Urine/BMs normal.  Breasts normal.  BMI 28.3.  Exercising regularly.  Health labs with Fam MD.  Past medical history,surgical history, family history and social history were all reviewed and documented in the EPIC chart.  Gynecologic History Patient's last menstrual period was 11/08/2018. Contraception: Condoms/H/O Infertility Last Pap: 10/2017. Results were: normal Last mammogram: Never.  Will schedule screening mammo at [facility] now. Bone Density: Never Colonoscopy: Never  Obstetric History OB History  Gravida Para Term Preterm AB Living  1 1       1   SAB TAB Ectopic Multiple Live Births               # Outcome Date GA Lbr Len/2nd Weight Sex Delivery Anes PTL Lv  1 Para              ROS: A ROS was performed and pertinent positives and negatives are included in the history.  GENERAL: No fevers or chills. HEENT: No change in vision, no earache, sore throat or sinus congestion. NECK: No pain or stiffness. CARDIOVASCULAR: No chest pain or pressure. No palpitations. PULMONARY: No shortness of breath, cough or wheeze. GASTROINTESTINAL: No abdominal pain, nausea, vomiting or diarrhea, melena or bright red blood per rectum. GENITOURINARY: No urinary frequency, urgency, hesitancy or dysuria. MUSCULOSKELETAL: No joint or muscle pain, no back pain, no recent trauma. DERMATOLOGIC: No rash, no itching, no lesions. ENDOCRINE: No polyuria, polydipsia, no heat or cold intolerance. No recent change in weight. HEMATOLOGICAL: No anemia or easy bruising or bleeding. NEUROLOGIC: No headache, seizures, numbness, tingling or weakness. PSYCHIATRIC: No depression, no  loss of interest in normal activity or change in sleep pattern.     Exam:   BP 140/90   Ht 5' 1.25" (1.556 m)   Wt 151 lb (68.5 kg)   LMP 11/08/2018   BMI 28.30 kg/m   Body mass index is 28.3 kg/m.  General appearance : Well developed well nourished female. No acute distress HEENT: Eyes: no retinal hemorrhage or exudates,  Neck supple, trachea midline, no carotid bruits, no thyroidmegaly Lungs: Clear to auscultation, no rhonchi or wheezes, or rib retractions  Heart: Regular rate and rhythm, no murmurs or gallops Breast:Examined in sitting and supine position were symmetrical in appearance, no palpable masses or tenderness,  no skin retraction, no nipple inversion, no nipple discharge, no skin discoloration, no axillary or supraclavicular lymphadenopathy Abdomen: no palpable masses or tenderness, no rebound or guarding Extremities: no edema or skin discoloration or tenderness  Pelvic: Vulva: Normal             Vagina: No gross lesions or discharge  Cervix: No gross lesions or discharge.  Pap reflex done.  Uterus  RV, normal size, mildly nodular, non-tender and mobile  Adnexa  Without masses or tenderness  Anus: Normal   Assessment/Plan:  43 y.o. female for annual exam   1. Encounter for routine gynecological examination with Papanicolaou smear of cervix Gynecologic exam with a normal size uterus, mildly nodular, no adnexal mass.  Pap reflex done.  Breast exam normal.  Patient will schedule her first screening  mammogram now.  Health labs with family physician.  2. Use of condoms for contraception  3. Dysmenorrhea Dysmenorrhea much improved since the birth of her daughter 3 years ago.  Prefers to continue without hormone therapy.  4. Fibroid Small asymptomatic fibroid.  Will observe.  5. Overweight (BMI 25.0-29.9) Recommend a slightly lower calorie/carb diet such as [employer].  Aerobic physical activities 5 times a week and weightlifting every 2 days.  Princess Bruins MD, 3:34 PM 11/12/2018

## 2018-11-13 ENCOUNTER — Encounter: Payer: Self-pay | Admitting: Obstetrics & Gynecology

## 2018-11-13 NOTE — Patient Instructions (Signed)
1. Encounter for routine gynecological examination with Papanicolaou smear of cervix Gynecologic exam with a normal size uterus, mildly nodular, no adnexal mass.  Pap reflex done.  Breast exam normal.  Patient will schedule her first screening mammogram now.  Health labs with family physician.  2. Use of condoms for contraception  3. Dysmenorrhea Dysmenorrhea much improved since the birth of her daughter 3 years ago.  Prefers to continue without hormone therapy.  4. Fibroid Small asymptomatic fibroid.  Will observe.  5. Overweight (BMI 25.0-29.9) Recommend a slightly lower calorie/carb diet such as [employer].  Aerobic physical activities 5 times a week and weightlifting every 2 days.  [name], it was a pleasure meeting you today!  I will inform you of your results as soon as they are available.

## 2018-11-14 ENCOUNTER — Other Ambulatory Visit: Payer: Self-pay | Admitting: Obstetrics & Gynecology

## 2018-11-14 DIAGNOSIS — Z1231 Encounter for screening mammogram for malignant neoplasm of breast: Secondary | ICD-10-CM

## 2018-11-14 LAB — PAP IG W/ RFLX HPV ASCU

## 2018-11-19 ENCOUNTER — Other Ambulatory Visit: Payer: Self-pay

## 2018-11-19 ENCOUNTER — Encounter: Payer: Self-pay | Admitting: Rheumatology

## 2018-11-19 ENCOUNTER — Ambulatory Visit: Payer: BC Managed Care – PPO | Admitting: Rheumatology

## 2018-11-19 VITALS — BP 145/101 | HR 75 | Resp 12 | Ht 60.0 in | Wt 151.2 lb

## 2018-11-19 DIAGNOSIS — D126 Benign neoplasm of colon, unspecified: Secondary | ICD-10-CM

## 2018-11-19 DIAGNOSIS — M25562 Pain in left knee: Secondary | ICD-10-CM

## 2018-11-19 DIAGNOSIS — M25561 Pain in right knee: Secondary | ICD-10-CM | POA: Diagnosis not present

## 2018-11-19 DIAGNOSIS — G8929 Other chronic pain: Secondary | ICD-10-CM

## 2018-11-19 DIAGNOSIS — I10 Essential (primary) hypertension: Secondary | ICD-10-CM

## 2018-11-19 DIAGNOSIS — M791 Myalgia, unspecified site: Secondary | ICD-10-CM

## 2018-11-19 DIAGNOSIS — R768 Other specified abnormal immunological findings in serum: Secondary | ICD-10-CM

## 2018-11-19 DIAGNOSIS — R202 Paresthesia of skin: Secondary | ICD-10-CM

## 2018-11-19 DIAGNOSIS — R7303 Prediabetes: Secondary | ICD-10-CM

## 2018-11-20 ENCOUNTER — Telehealth: Payer: Self-pay | Admitting: *Deleted

## 2018-11-20 DIAGNOSIS — E559 Vitamin D deficiency, unspecified: Secondary | ICD-10-CM

## 2018-11-20 MED ORDER — VITAMIN D (ERGOCALCIFEROL) 1.25 MG (50000 UNIT) PO CAPS: 50000.0000 [IU] | ORAL_CAPSULE | ORAL | 0 refills | Status: DC

## 2018-11-20 NOTE — Telephone Encounter (Signed)
-----   Message from Bo Merino, MD sent at 11/20/2018  8:02 AM EDT ----- Vitamin D deficiency.  Patient will start vitamin D 50,000 units once a week for 3 months.  Most the labs are still pending.

## 2018-11-20 NOTE — Progress Notes (Signed)
Vitamin D deficiency.  Patient will start vitamin D 50,000 units once a week for 3 months.  Most the labs are still pending.

## 2018-11-21 LAB — C3 AND C4
C3 Complement: 155 mg/dL (ref 83–193)
C4 Complement: 26 mg/dL (ref 15–57)

## 2018-11-21 LAB — ANTI-SCLERODERMA ANTIBODY: Scleroderma (Scl-70) (ENA) Antibody, IgG: <1 AI

## 2018-11-21 LAB — SJOGRENS SYNDROME-A EXTRACTABLE NUCLEAR ANTIBODY: SSA (Ro) (ENA) Antibody, IgG: >8 AI — AB

## 2018-11-21 LAB — CK: Total CK: 53 U/L (ref 29–143)

## 2018-11-21 LAB — HLA-B27 ANTIGEN: HLA-B27 Antigen: NEGATIVE

## 2018-11-21 LAB — TSH: TSH: 0.93 mIU/L

## 2018-11-21 LAB — SEDIMENTATION RATE: Sed Rate: 34 mm/h — ABNORMAL HIGH (ref 0–20)

## 2018-11-21 LAB — RNP ANTIBODY: Ribonucleic Protein(ENA) Antibody, IgG: <1 AI

## 2018-11-21 LAB — CYCLIC CITRUL PEPTIDE ANTIBODY, IGG: Cyclic Citrullin Peptide Ab: <16 UNITS

## 2018-11-21 LAB — ANTI-SMITH ANTIBODY: ENA SM Ab Ser-aCnc: <1 AI

## 2018-11-21 LAB — SJOGRENS SYNDROME-B EXTRACTABLE NUCLEAR ANTIBODY: SSB (La) (ENA) Antibody, IgG: <1 AI

## 2018-11-21 LAB — ANTI-DNA ANTIBODY, DOUBLE-STRANDED: ds DNA Ab: <1 IU/mL

## 2018-11-21 LAB — VITAMIN D 25 HYDROXY (VIT D DEFICIENCY, FRACTURES): Vit D, 25-Hydroxy: 21 ng/mL — ABNORMAL LOW (ref 30–100)

## 2018-11-24 ENCOUNTER — Ambulatory Visit
Admission: RE | Admit: 2018-11-24 | Discharge: 2018-11-24 | Disposition: A | Discharge status: Home / Self Care | Payer: BC Managed Care – PPO | Source: Ambulatory Visit | Attending: Obstetrics & Gynecology | Admitting: Obstetrics & Gynecology

## 2018-11-24 ENCOUNTER — Other Ambulatory Visit: Payer: Self-pay

## 2018-11-24 DIAGNOSIS — Z1231 Encounter for screening mammogram for malignant neoplasm of breast: Secondary | ICD-10-CM

## 2018-11-24 IMAGING — MG DIGITAL SCREENING BILAT W/ TOMO W/ CAD
8 series · 9 of 24 positions shown · non-contrast
Comparison: Previous exam(s).

CLINICAL DATA: Screening.

EXAM:
DIGITAL SCREENING BILATERAL MAMMOGRAM WITH TOMO AND CAD

[L MLO synth-2D]
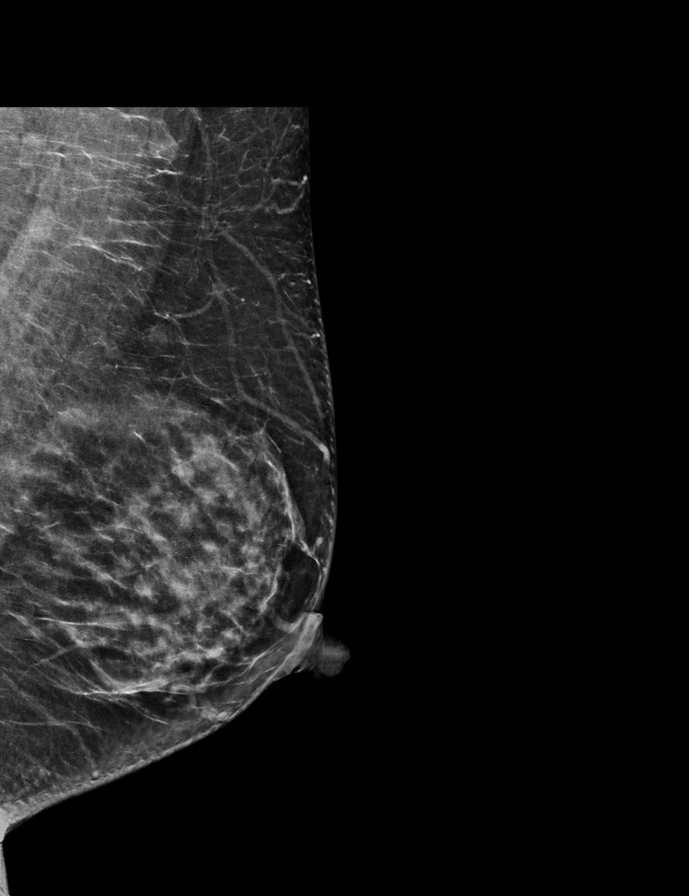

[R MLO synth-2D]
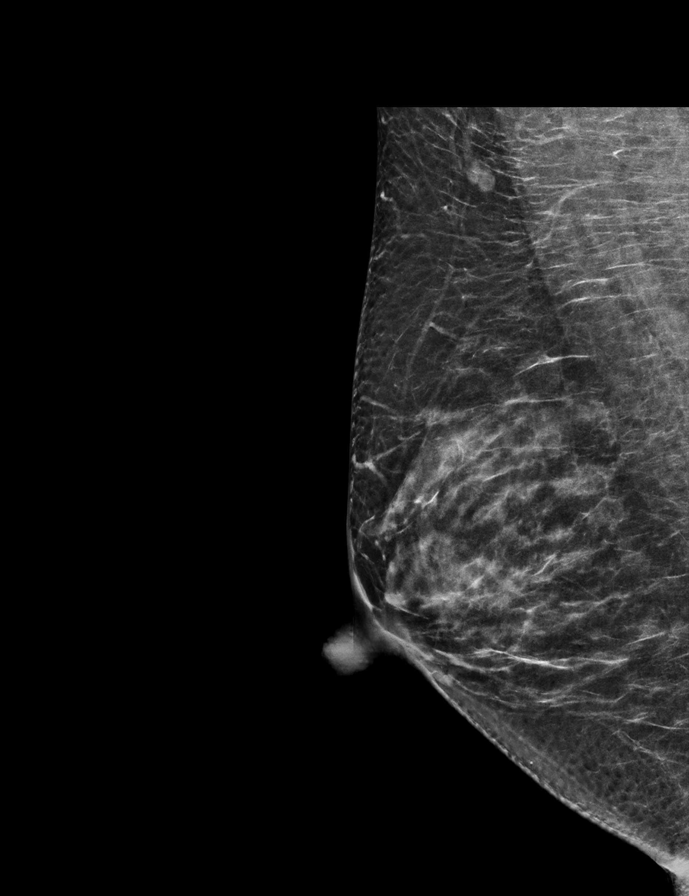

[R CC synth-2D]
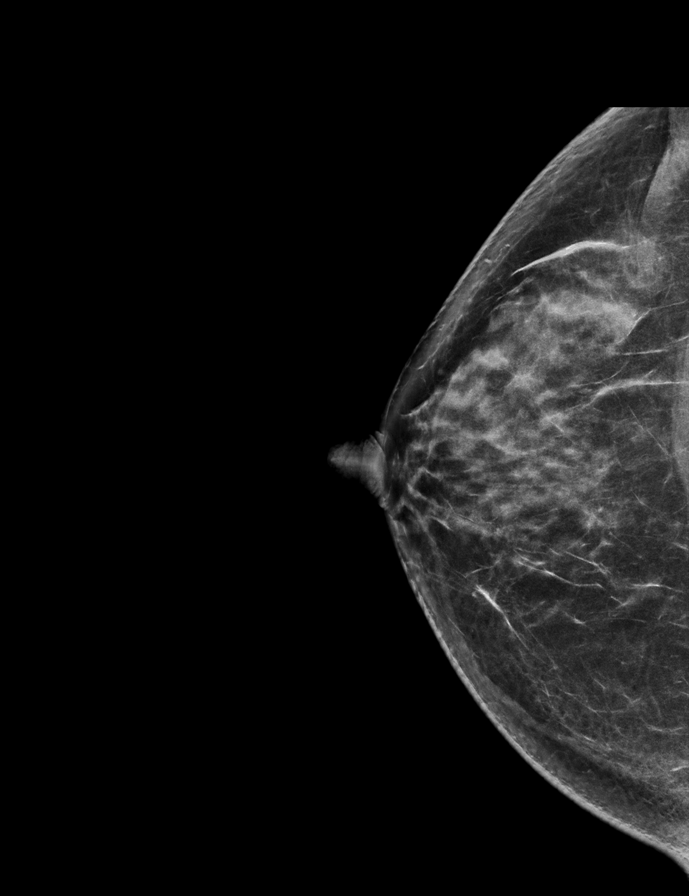

[L CC synth-2D]
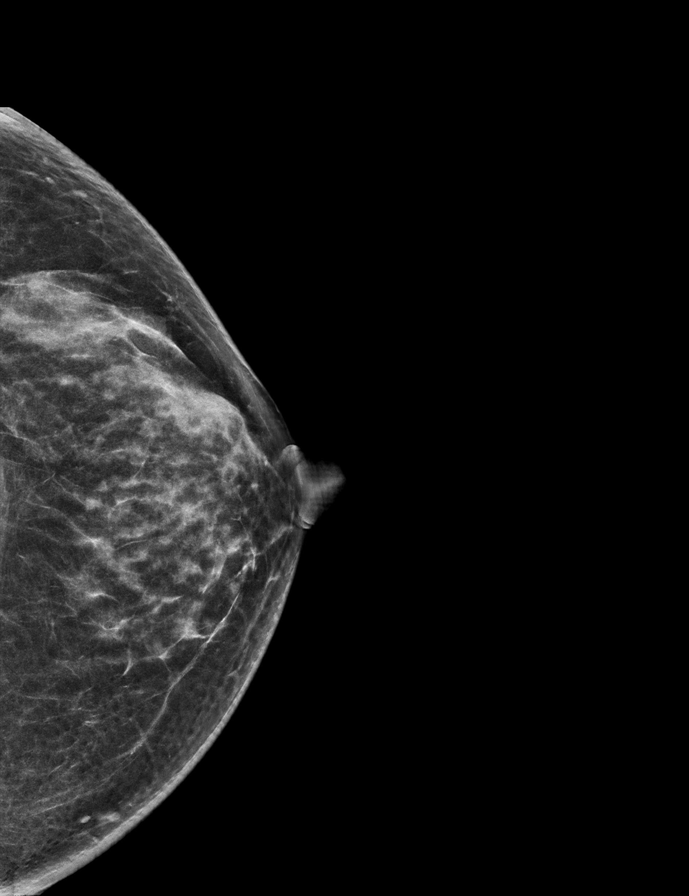

[L MLO tomo · 2 of 65 frames shown]
[frame 21/65]
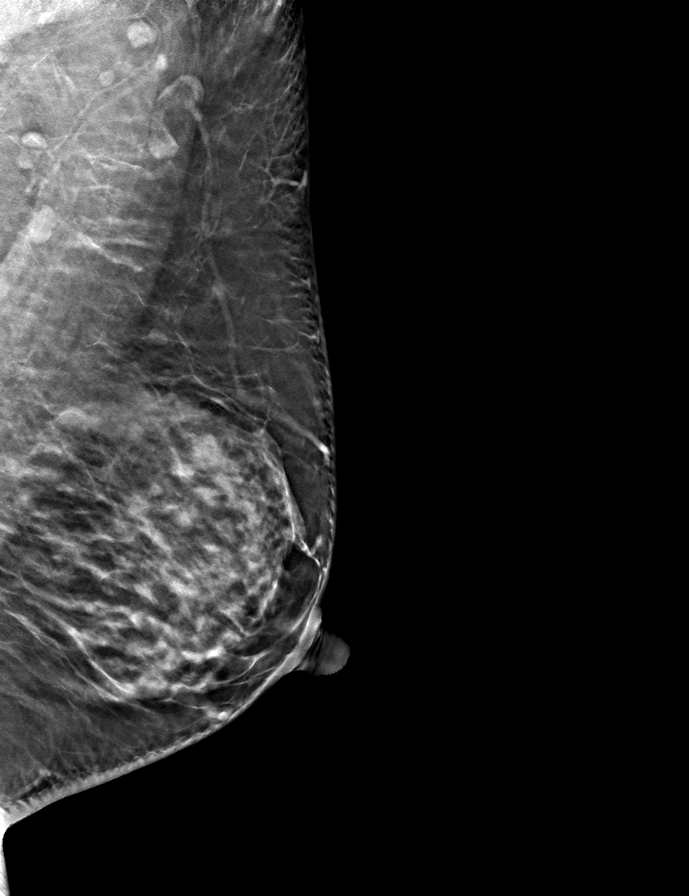
[frame 33/65]
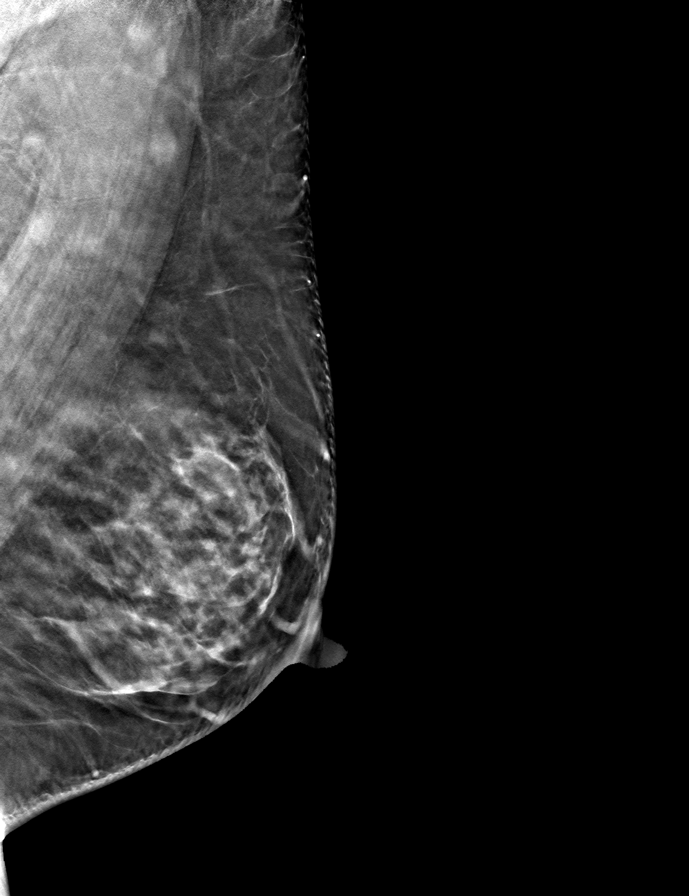

[L CC tomo · tomo slice 31/61.0]
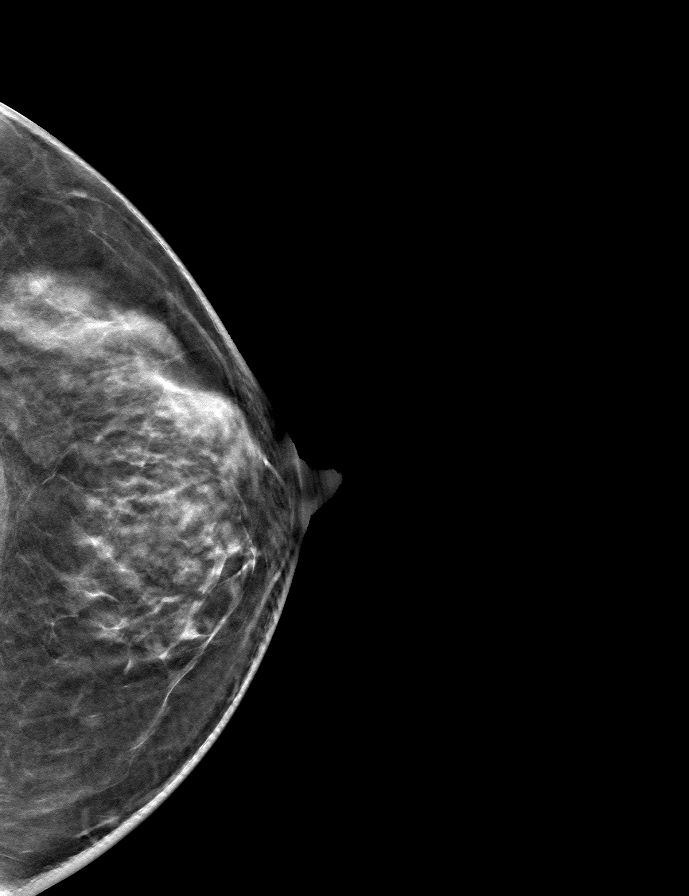

[R MLO tomo · tomo slice 31/62.0]
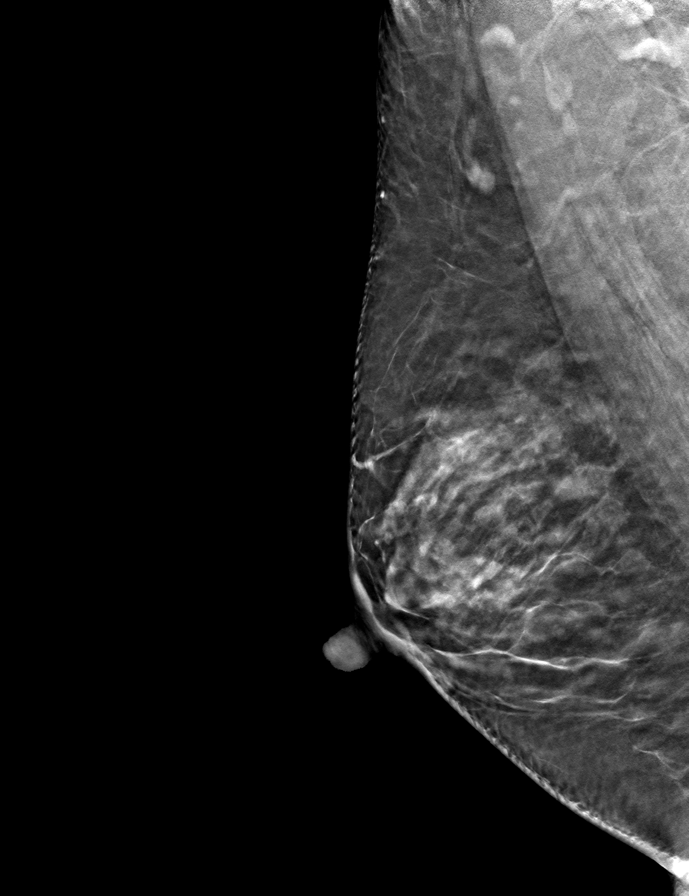

[R CC tomo · tomo slice 34/67.0]
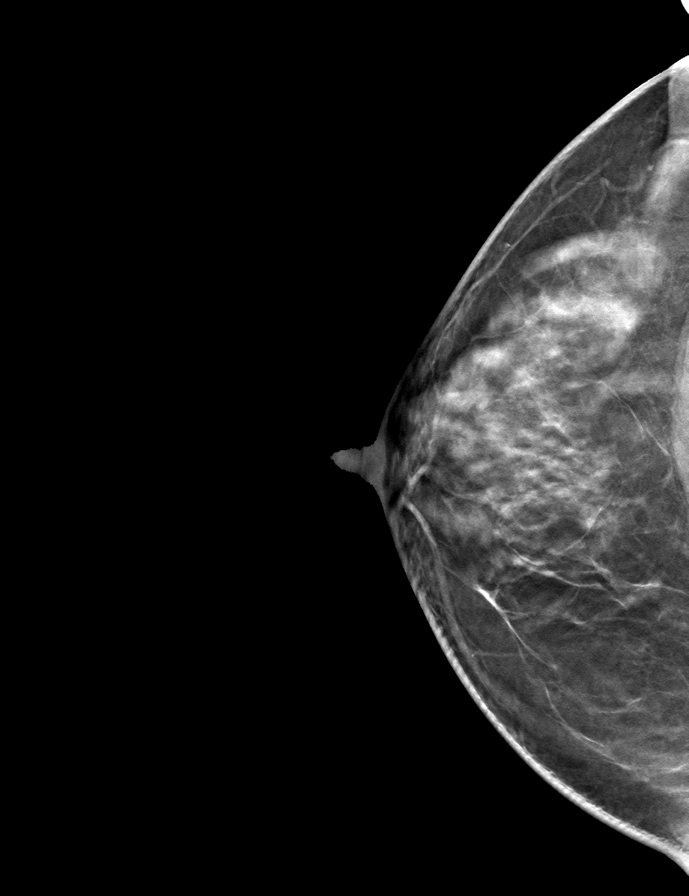

[9 of 24 positions shown; findings below may reference images not displayed]

ACR Breast Density Category c: The breast tissue is heterogeneously
dense, which may obscure small masses.
FINDINGS: There are no findings suspicious for malignancy. Images were
processed with CAD.
IMPRESSION: No mammographic evidence of malignancy. A result letter of this
screening mammogram will be mailed directly to the patient.

RECOMMENDATION:
Screening mammogram in one year. (Code:[5V])

BI-RADS CATEGORY  1: Negative.

## 2018-11-24 NOTE — Progress Notes (Signed)
All the lab results are available now.  If there is an earlier appointment open please give her an appointment.  This is not urgent.

## 2018-11-25 ENCOUNTER — Ambulatory Visit: Payer: Self-pay

## 2018-11-25 ENCOUNTER — Ambulatory Visit: Payer: BC Managed Care – PPO | Admitting: Physician Assistant

## 2018-11-25 ENCOUNTER — Other Ambulatory Visit: Payer: Self-pay

## 2018-11-25 ENCOUNTER — Encounter: Payer: Self-pay | Admitting: Physician Assistant

## 2018-11-25 VITALS — BP 120/80 | HR 84 | Resp 12 | Ht 60.0 in | Wt 154.0 lb

## 2018-11-25 DIAGNOSIS — M25561 Pain in right knee: Secondary | ICD-10-CM

## 2018-11-25 DIAGNOSIS — I10 Essential (primary) hypertension: Secondary | ICD-10-CM

## 2018-11-25 DIAGNOSIS — D126 Benign neoplasm of colon, unspecified: Secondary | ICD-10-CM

## 2018-11-25 DIAGNOSIS — E559 Vitamin D deficiency, unspecified: Secondary | ICD-10-CM

## 2018-11-25 DIAGNOSIS — R202 Paresthesia of skin: Secondary | ICD-10-CM

## 2018-11-25 DIAGNOSIS — R7303 Prediabetes: Secondary | ICD-10-CM

## 2018-11-25 DIAGNOSIS — M25562 Pain in left knee: Secondary | ICD-10-CM | POA: Diagnosis not present

## 2018-11-25 DIAGNOSIS — M791 Myalgia, unspecified site: Secondary | ICD-10-CM | POA: Diagnosis not present

## 2018-11-25 DIAGNOSIS — R768 Other specified abnormal immunological findings in serum: Secondary | ICD-10-CM

## 2018-11-25 DIAGNOSIS — G8929 Other chronic pain: Secondary | ICD-10-CM

## 2018-11-25 NOTE — Patient Instructions (Signed)
Journal for Nurse Practitioners, 15(4), 263-267. Retrieved November 11, 2017 from http://clinicalkey.com/nursing">  Knee Exercises Ask your health care provider which exercises are safe for you. Do exercises exactly as told by your health care provider and adjust them as directed. It is normal to feel mild stretching, pulling, tightness, or discomfort as you do these exercises. Stop right away if you feel sudden pain or your pain gets worse. Do not begin these exercises until told by your health care provider. Stretching and range-of-motion exercises These exercises warm up your muscles and joints and improve the movement and flexibility of your knee. These exercises also help to relieve pain and swelling. Knee extension, prone 1. Lie on your abdomen (prone position) on a bed. 2. Place your left / right knee just beyond the edge of the surface so your knee is not on the bed. You can put a towel under your left / right thigh just above your kneecap for comfort. 3. Relax your leg muscles and allow gravity to straighten your knee (extension). You should feel a stretch behind your left / right knee. 4. Hold this position for __________ seconds. 5. Scoot up so your knee is supported between repetitions. Repeat __________ times. Complete this exercise __________ times a day. Knee flexion, active  1. Lie on your back with both legs straight. If this causes back discomfort, bend your left / right knee so your foot is flat on the floor. 2. Slowly slide your left / right heel back toward your buttocks. Stop when you feel a gentle stretch in the front of your knee or thigh (flexion). 3. Hold this position for __________ seconds. 4. Slowly slide your left / right heel back to the starting position. Repeat __________ times. Complete this exercise __________ times a day. Quadriceps stretch, prone  1. Lie on your abdomen on a firm surface, such as a bed or padded floor. 2. Bend your left / right knee and hold  your ankle. If you cannot reach your ankle or pant leg, loop a belt around your foot and grab the belt instead. 3. Gently pull your heel toward your buttocks. Your knee should not slide out to the side. You should feel a stretch in the front of your thigh and knee (quadriceps). 4. Hold this position for __________ seconds. Repeat __________ times. Complete this exercise __________ times a day. Hamstring, supine 1. Lie on your back (supine position). 2. Loop a belt or towel over the ball of your left / right foot. The ball of your foot is on the walking surface, right under your toes. 3. Straighten your left / right knee and slowly pull on the belt to raise your leg until you feel a gentle stretch behind your knee (hamstring). ? Do not let your knee bend while you do this. ? Keep your other leg flat on the floor. 4. Hold this position for __________ seconds. Repeat __________ times. Complete this exercise __________ times a day. Strengthening exercises These exercises build strength and endurance in your knee. Endurance is the ability to use your muscles for a long time, even after they get tired. Quadriceps, isometric This exercise stretches the muscles in front of your thigh (quadriceps) without moving your knee joint (isometric). 1. Lie on your back with your left / right leg extended and your other knee bent. Put a rolled towel or small pillow under your knee if told by your health care provider. 2. Slowly tense the muscles in the front of your left /   right thigh. You should see your kneecap slide up toward your hip or see increased dimpling just above the knee. This motion will push the back of the knee toward the floor. 3. For __________ seconds, hold the muscle as tight as you can without increasing your pain. 4. Relax the muscles slowly and completely. Repeat __________ times. Complete this exercise __________ times a day. Straight leg raises This exercise stretches the muscles in front  of your thigh (quadriceps) and the muscles that move your hips (hip flexors). 1. Lie on your back with your left / right leg extended and your other knee bent. 2. Tense the muscles in the front of your left / right thigh. You should see your kneecap slide up or see increased dimpling just above the knee. Your thigh may even shake a bit. 3. Keep these muscles tight as you raise your leg 4-6 inches (10-15 cm) off the floor. Do not let your knee bend. 4. Hold this position for __________ seconds. 5. Keep these muscles tense as you lower your leg. 6. Relax your muscles slowly and completely after each repetition. Repeat __________ times. Complete this exercise __________ times a day. Hamstring, isometric 1. Lie on your back on a firm surface. 2. Bend your left / right knee about __________ degrees. 3. Dig your left / right heel into the surface as if you are trying to pull it toward your buttocks. Tighten the muscles in the back of your thighs (hamstring) to "dig" as hard as you can without increasing any pain. 4. Hold this position for __________ seconds. 5. Release the tension gradually and allow your muscles to relax completely for __________ seconds after each repetition. Repeat __________ times. Complete this exercise __________ times a day. Hamstring curls If told by your health care provider, do this exercise while wearing ankle weights. Begin with __________ lb weights. Then increase the weight by 1 lb (0.5 kg) increments. Do not wear ankle weights that are more than __________ lb. 1. Lie on your abdomen with your legs straight. 2. Bend your left / right knee as far as you can without feeling pain. Keep your hips flat against the floor. 3. Hold this position for __________ seconds. 4. Slowly lower your leg to the starting position. Repeat __________ times. Complete this exercise __________ times a day. Squats This exercise strengthens the muscles in front of your thigh and knee  (quadriceps). 1. Stand in front of a table, with your feet and knees pointing straight ahead. You may rest your hands on the table for balance but not for support. 2. Slowly bend your knees and lower your hips like you are going to sit in a chair. ? Keep your weight over your heels, not over your toes. ? Keep your lower legs upright so they are parallel with the table legs. ? Do not let your hips go lower than your knees. ? Do not bend lower than told by your health care provider. ? If your knee pain increases, do not bend as low. 3. Hold the squat position for __________ seconds. 4. Slowly push with your legs to return to standing. Do not use your hands to pull yourself to standing. Repeat __________ times. Complete this exercise __________ times a day. Wall slides This exercise strengthens the muscles in front of your thigh and knee (quadriceps). 1. Lean your back against a smooth wall or door, and walk your feet out 18-24 inches (46-61 cm) from it. 2. Place your feet hip-width apart. 3.   Slowly slide down the wall or door until your knees bend __________ degrees. Keep your knees over your heels, not over your toes. Keep your knees in line with your hips. 4. Hold this position for __________ seconds. Repeat __________ times. Complete this exercise __________ times a day. Straight leg raises This exercise strengthens the muscles that rotate the leg at the hip and move it away from your body (hip abductors). 1. Lie on your side with your left / right leg in the top position. Lie so your head, shoulder, knee, and hip line up. You may bend your bottom knee to help you keep your balance. 2. Roll your hips slightly forward so your hips are stacked directly over each other and your left / right knee is facing forward. 3. Leading with your heel, lift your top leg 4-6 inches (10-15 cm). You should feel the muscles in your outer hip lifting. ? Do not let your foot drift forward. ? Do not let your knee  roll toward the ceiling. 4. Hold this position for __________ seconds. 5. Slowly return your leg to the starting position. 6. Let your muscles relax completely after each repetition. Repeat __________ times. Complete this exercise __________ times a day. Straight leg raises This exercise stretches the muscles that move your hips away from the front of the pelvis (hip extensors). 1. Lie on your abdomen on a firm surface. You can put a pillow under your hips if that is more comfortable. 2. Tense the muscles in your buttocks and lift your left / right leg about 4-6 inches (10-15 cm). Keep your knee straight as you lift your leg. 3. Hold this position for __________ seconds. 4. Slowly lower your leg to the starting position. 5. Let your leg relax completely after each repetition. Repeat __________ times. Complete this exercise __________ times a day. This information is not intended to replace advice given to you by your health care provider. Make sure you discuss any questions you have with your health care provider. Document Released: 12/06/2004 Document Revised: 11/12/2017 Document Reviewed: 11/12/2017 [employer] Patient Education  2020 [employer]  

## 2018-11-25 NOTE — Progress Notes (Signed)
Office Visit Note  Patient: Joanne Jackson             Date of Birth: 01-12-1976           MRN: [id number]             PCP: Orma Flaming, MD Referring: Orma Flaming, MD Visit Date: 11/25/2018 Occupation: '@GUAROCC'$ @  Subjective:  Fatigue   History of Present Illness: Joanne Jackson is a 43 y.o. female with history of positive ANA and myalgia. She states over the past 2 days she has been experiencing increased fatigue.  She continues to have some myalgias in her lower extremities, but she states the discomfort has improved.  She denies any muscle weakness.  She reports pain in both knee joints but denies any joint swelling.  She continues to experience paresthesias in both hands.  She has tried wearing carpal tunnel night splints but has not noticed much improvement.  She experiences stiffness in both hands first thing in the morning but denies any obvious swelling or tenderness.    Activities of Daily Living:  Patient reports morning stiffness for  1  minutes.   Patient Denies nocturnal pain.  Difficulty dressing/grooming: Denies Difficulty climbing stairs: Denies Difficulty getting out of chair: Denies Difficulty using hands for taps, buttons, cutlery, and/or writing: Denies  Review of Systems  Constitutional: Positive for fatigue.  HENT: Negative for mouth sores, mouth dryness and nose dryness.   Eyes: Negative for pain, visual disturbance and dryness.  Respiratory: Negative for cough, hemoptysis, shortness of breath and difficulty breathing.   Cardiovascular: Negative for chest pain, palpitations, hypertension and swelling in legs/feet.  Gastrointestinal: Negative for blood in stool, constipation and diarrhea.  Endocrine: Negative for increased urination.  Genitourinary: Negative for painful urination.  Musculoskeletal: Positive for arthralgias, joint pain and morning stiffness. Negative for joint swelling, myalgias, muscle weakness, muscle tenderness and myalgias.  Skin: Negative  for color change, pallor, rash, hair loss, nodules/bumps, skin tightness, ulcers and sensitivity to sunlight.  Allergic/Immunologic: Negative for susceptible to infections.  Neurological: Positive for numbness. Negative for dizziness, headaches and weakness.  Hematological: Negative for swollen glands.  Psychiatric/Behavioral: Negative for depressed mood and sleep disturbance. The patient is not nervous/anxious.     PMFS History:  Patient Active Problem List   Diagnosis Date Noted  . Tubular adenoma of colon 10/24/2018  . Essential hypertension 12/26/2017  . Prediabetes 12/26/2017    Past Medical History:  Diagnosis Date  . Hypertension   . Pre-diabetes     Family History  Problem Relation Age of Onset  . Thyroid disease Mother   . Heart attack Father   . Hypertension Father   . Healthy Brother   . Healthy Daughter   . Colon cancer Neg Hx    Past Surgical History:  Procedure Laterality Date  . CESAREAN SECTION    . DILATION AND CURETTAGE, DIAGNOSTIC / THERAPEUTIC     Social History   Social History Narrative  . Not on file   Immunization History  Administered Date(s) Administered  . Influenza,inj,Quad PF,6+ Mos 10/24/2018     Objective: Vital Signs: BP 120/80 (BP Location: Left Arm, Patient Position: Sitting, Cuff Size: Small)   Pulse 84   Resp 12   Ht 5' (1.524 m)   Wt 154 lb (69.9 kg)   LMP 11/08/2018   BMI 30.08 kg/m    Physical Exam Vitals signs and nursing note reviewed.  Constitutional:      Appearance: She is well-developed.  HENT:  Head: Normocephalic and atraumatic.  Eyes:     Conjunctiva/sclera: Conjunctivae normal.  Neck:     Musculoskeletal: Normal range of motion.  Cardiovascular:     Rate and Rhythm: Normal rate and regular rhythm.     Heart sounds: Normal heart sounds.  Pulmonary:     Effort: Pulmonary effort is normal.     Breath sounds: Normal breath sounds.  Abdominal:     General: Bowel sounds are normal.     Palpations:  Abdomen is soft.  Lymphadenopathy:     Cervical: No cervical adenopathy.  Skin:    General: Skin is warm and dry.     Capillary Refill: Capillary refill takes less than 2 seconds.  Neurological:     Mental Status: She is alert and oriented to person, place, and time.  Psychiatric:        Behavior: Behavior normal.      Musculoskeletal Exam: C-spine, thoracic spine, good ROM.  No midline spinal tenderness.  No SI joint tenderness.  Shoulder joints, elbow joints, wrist joints, MCPs, PIPs, and DIPs good ROM with no synovitis.  Complete fist formation bilaterally.  Hip joints, knee joints, ankle joints, MTPs, PIPs, and DIPs good ROM with no synovitis.  No warmth or effusion of knee joints.  No tenderness or swelling of ankle joints.    CDAI Exam: CDAI Score: - Patient Global: -; Provider Global: - Swollen: -; Tender: - Joint Exam   No joint exam has been documented for this visit   There is currently no information documented on the homunculus. Go to the Rheumatology activity and complete the homunculus joint exam.  Investigation: No additional findings. Component     Latest Ref Rng & Units 11/19/2018  C3 Complement     83 - 193 mg/dL 155  C4 Complement     15 - 57 mg/dL 26  Sed Rate     0 - 20 mm/h 34 (H)  CK Total     29 - 143 U/L 53  TSH     mIU/L 0.93  Vitamin D, 25-Hydroxy     30 - 100 ng/mL 21 (L)  Cyclic Citrullin Peptide Ab     UNITS <16  Scleroderma (Scl-70) (ENA) Antibody, IgG     <1.0 NEG AI <1.0 NEG  Ribonucleic Protein(ENA) Antibody, IgG     <1.0 NEG AI <1.0 NEG  ENA SM Ab Ser-aCnc     <1.0 NEG AI <1.0 NEG  SSA (Ro) (ENA) Antibody, IgG     <1.0 NEG AI >8.0 POS (A)  SSB (La) (ENA) Antibody, IgG     <1.0 NEG AI <1.0 NEG  ds DNA Ab     IU/mL <1  HLA-B27 Antigen     NEGATIVE NEGATIVE   Imaging: Dg Lumbar Spine Complete  Result Date: 11/03/2018 CLINICAL DATA:  pain EXAM: LUMBAR SPINE - COMPLETE 4+ VIEW COMPARISON:  None. FINDINGS: Normal spinal  alignment. Vertebral body heights are maintained without evidence of fracture. Intervertebral disc spaces are maintained. Minimal anterior spurring. No focal bony lesion. Sacroiliac joints are open. Nonobstructive bowel gas pattern. IMPRESSION: No acute osseous abnormality in the lumbar spine. Electronically Signed   By: Audie Pinto M.D.   On: 11/03/2018 21:54   Mm 3d Screen Breast Bilateral  Result Date: 11/25/2018 CLINICAL DATA:  Screening. EXAM: DIGITAL SCREENING BILATERAL MAMMOGRAM WITH TOMO AND CAD COMPARISON:  Previous exam(s). ACR Breast Density Category c: The breast tissue is heterogeneously dense, which may obscure small masses. FINDINGS: There are no  findings suspicious for malignancy. Images were processed with CAD. IMPRESSION: No mammographic evidence of malignancy. A result letter of this screening mammogram will be mailed directly to the patient. RECOMMENDATION: Screening mammogram in one year. (Code:SM-B-01Y) BI-RADS CATEGORY  1: Negative. Electronically Signed   By: Lajean Manes M.D.   On: 11/25/2018 10:12   Dg Hip Unilat W Or W/o Pelvis 2-3 Views Left  Result Date: 11/03/2018 CLINICAL DATA:  Sacral and bilateral hip pain EXAM: DG HIP (WITH OR WITHOUT PELVIS) 2-3V LEFT COMPARISON:  None. FINDINGS: There is no evidence of hip fracture or dislocation. Minimal acetabular spurring. No other focal bone abnormality. IMPRESSION: Minimal hip arthrosis. No acute osseous abnormality. Electronically Signed   By: Lovena Le M.D.   On: 11/03/2018 23:36   Dg Hip Unilat W Or W/o Pelvis 2-3 Views Right  Result Date: 11/03/2018 CLINICAL DATA:  Sacral and bilateral hip pain EXAM: DG HIP (WITH OR WITHOUT PELVIS) 2-3V RIGHT COMPARISON:  None FINDINGS: Bones of the pelvis are congruent without abnormal diastatic widening of the SI joints or symphysis pubis. Both femoral heads are normally located. Proximal right femur is intact. Minimal degenerative changes are present in the right hip, similar in  extent to those seen in the left as well. Early enthesopathic changes are noted on the iliac crests. Soft tissues are unremarkable. IMPRESSION: Minimal degenerative changes in the right hip, similar in extent to those seen in the left as well. No acute osseous abnormality. Electronically Signed   By: Lovena Le M.D.   On: 11/03/2018 23:38    Recent Labs: Lab Results  Component Value Date   WBC 5.3 10/24/2018   HGB 13.4 10/24/2018   PLT 257.0 10/24/2018   NA 137 10/24/2018   K 4.2 10/24/2018   CL 103 10/24/2018   CO2 25 10/24/2018   GLUCOSE 86 10/24/2018   BUN 9 10/24/2018   CREATININE 0.57 10/24/2018   BILITOT 0.6 10/24/2018   ALKPHOS 72 10/24/2018   AST 15 10/24/2018   ALT 13 10/24/2018   PROT 7.3 10/24/2018   ALBUMIN 4.3 10/24/2018   CALCIUM 9.5 10/24/2018  November 19, 2018 ENA negative except SSA> 8.0, C3-C4 normal, ESR 34, TSH normal, CK normal, vitamin D 21, anti-CCP negative, HLA-B27 negative  Speciality Comments: No specialty comments available.  Procedures:  No procedures performed Allergies: Patient has no known allergies.   Assessment / Plan:     Visit Diagnoses: Positive ANA (antinuclear antibody) - TSH 0.75, HIV-, ANA 1:160 NS, RF<14, sed rate 26, CRP<1, B burgdorferi Ab-, Ro+, La-, dsDNA-, HLA-B27-, sm-, RNP-, scl-70-, complements WNL: She has no clinical features of autoimmune disease at this time.  Lab work from 11/19/18 was reviewed with the patient and all questions were addressed.  We will continue to monitor lab work every 6 months.  She was advised to notify us if she develops any new or worsening symptoms.  She will follow up in 6 months.   Myalgia: She has intermittent myalgias and muscle tenderness in bilateral lower extremities.  She has no muscle weakness.  She has no difficulty getting up from a seated position.  CK was WNL-53 and sed rate was 34 on 11/19/18.  She was advised to notify us if she develops any new or worsening symptoms.  Paresthesia of  both hands: She has paresthesias in both hands intermittently.  She has been wearing carpal tunnel night splints, but she has not noticed any improvement yet.  She has not had nerve conduction studies  yet.   Chronic pain of both knees -She has chronic pain in both knee joints.  She has good ROM of both knee joints with no discomfort.  No warmth or effusion of knee joints noted.  X-rays of both knee joints were obtained today.  She was given a handout of knee joint exercises. Plan: XR KNEE 3 VIEW RIGHT, XR KNEE 3 VIEW LEFT.  X-ray of bilateral knee joints were unremarkable.  Vitamin D deficiency: Vitamin D was 21 on 11/19/18.  She has started taking vitamin D 50,000 units by mouth once weekly.  We will recheck vitamin D in 3 months.   Other medical conditions are listed as follows:   Essential hypertension  Prediabetes  Tubular adenoma of colon  Orders: Orders Placed This Encounter  Procedures  . XR KNEE 3 VIEW RIGHT  . XR KNEE 3 VIEW LEFT   No orders of the defined types were placed in this encounter.   Face-to-face time spent with patient was 30 minutes. Greater than 50% of time was spent in counseling and coordination of care.  Follow-Up Instructions: Return in about 6 months (around 05/26/2019) for Positive ANA, +Ro.   Ofilia Neas, PA-C   I examined and evaluated the patient with Hazel Sams PA.  Patient has positive ANA and positive Ro antibody.  Although she has no symptoms of Sjogren's.  I have advised her to contact me in case she develops any new symptoms.  She states the myalgias she was experiencing in her lower extremities have improved.  Her CK was normal.  She was having knee joint discomfort.  X-rays obtained today were unremarkable.  We have given her a handout on knee joint exercises.  The plan of care was discussed as noted above.  Bo Merino, MD  Note - This record has been created using Editor, commissioning.  Chart creation errors have been sought, but may not  always  have been located. Such creation errors do not reflect on  the standard of medical care.

## 2018-11-28 ENCOUNTER — Ambulatory Visit (INDEPENDENT_AMBULATORY_CARE_PROVIDER_SITE_OTHER): Payer: BC Managed Care – PPO | Admitting: Gastroenterology

## 2018-11-28 ENCOUNTER — Other Ambulatory Visit: Payer: Self-pay

## 2018-11-28 ENCOUNTER — Encounter: Payer: Self-pay | Admitting: Gastroenterology

## 2018-11-28 VITALS — BP 126/88 | HR 74 | Temp 98.3°F | Ht 60.0 in | Wt 153.0 lb

## 2018-11-28 DIAGNOSIS — K219 Gastro-esophageal reflux disease without esophagitis: Secondary | ICD-10-CM

## 2018-11-28 DIAGNOSIS — R197 Diarrhea, unspecified: Secondary | ICD-10-CM | POA: Diagnosis not present

## 2018-11-28 DIAGNOSIS — K635 Polyp of colon: Secondary | ICD-10-CM | POA: Diagnosis not present

## 2018-11-28 NOTE — Progress Notes (Signed)
P  Chief Complaint:    Diarrhea, tubular adenoma, GERD  GI History: 43 y.o. female referred to the Gastroenterology Clinic for evaluation of multiple GI complaints:  1) Change in bowel habits: Controlled Increased stool frequency and loose, nonbloody stools, with up to 3-5 BM/day.  No watery stool.  No fecal incontinence/seepage. Stool studies normal.  Improved with probiotics, then essentially resolved with fiber supplementation.  Colonoscopy as below.  2) Dyspepsia: Resolved Burning pain in MEG. Lasts a few seconds, typically after drinking liquids. Nausea without emesis.  No radiation.  All resolved with PPI  3) Reflux: Controlled Heartburn. Similar sxs during pregnancy. No dysphagia. Increased throat clearing and mucus sensation/globus sensation for approx 5 years. Worse in the AM. No regurgitation per se.   Essentially resolved with omeprazole 40 mg/day  Endoscopic history: -Colonoscopy (09/2018, Dr. Bryan Lemma): 1 HP, 1 subcentimeter TA, normal colonic mucosa (biopsies negative for [facility]), a few nonbleeding aphthous ulcers in the TI (biopsies negative for chronic inflammation), widely patent IC valve with free flow of stool anterograde/retrograde  HPI:     Patient is a 43 y.o. female presenting to the Gastroenterology Clinic for follow-up.  Initially seen in 09/2018.  Colonoscopy completed and notable for 1 small HP and one small TA.  Otherwise normal colonic mucosa with biopsies negative for MC.  Interestingly she had a widely patent IC valve with free flow of stool through the valve and a few nonbleeding aphthous ulcers, with biopsies negative for chronic inflammatory change.  Since that appointment, she has been seen in the Rheumatology Clinic.  Positive ANA and positive Ro antibody.  Has myalgias and paresthesias, but not diagnosed with autoimmune disease.  Vitamin D deficiency (21)- started on Ergocalciferol. Scl-70 was negative.   Now having just 1-2 BM/day since starting  fiber supplement (Fiber-Con).   Reflux resolved completely with omeprazole 40 mg/day.  Tolerating all p.o. intake.  She is very happy with results for her upper and lower GI symptoms and essentially no complaints today.   Review of systems:     No chest pain, no SOB, no fevers, no urinary sx   Past Medical History:  Diagnosis Date  . Hypertension   . Pre-diabetes     Patient's surgical history, family medical history, social history, medications and allergies were all reviewed in Epic    Current Outpatient Medications  Medication Sig Dispense Refill  . Azelastine-Fluticasone 137-50 MCG/ACT SUSP Place 1 spray into the nose 2 (two) times daily. 23 g 3  . lisinopril (ZESTRIL) 20 MG tablet Take 20 mg by mouth daily.    Marland Kitchen omeprazole (PRILOSEC) 40 MG capsule Take 1 capsule (40 mg total) by mouth daily. 180 capsule 1  . Vitamin D, Ergocalciferol, (DRISDOL) 1.25 MG (50000 UT) CAPS capsule Take 1 capsule (50,000 Units total) by mouth every 7 (seven) days. 12 capsule 0   No current facility-administered medications for this visit.     Physical Exam:     BP 126/88   Pulse 74   Temp 98.3 F (36.8 C)   Ht 5' (1.524 m)   Wt 153 lb (69.4 kg)   LMP 11/08/2018   BMI 29.88 kg/m   GENERAL:  Pleasant female in NAD PSYCH: : Cooperative, normal affect EENT:  conjunctiva pink, mucous membranes moist, neck supple without masses CARDIAC:  RRR, no murmur heard, no peripheral edema PULM: Normal respiratory effort, lungs CTA bilaterally, no wheezing ABDOMEN:  Nondistended, soft, nontender. No obvious masses, no hepatomegaly,  normal bowel sounds  SKIN:  turgor, no lesions seen Musculoskeletal:  Normal muscle tone, normal strength NEURO: Alert and oriented x 3, no focal neurologic deficits   IMPRESSION and PLAN:    1) Diarrhea 2) Patulous IC valve -Diarrhea essentially resolved with fiber supplementation. -Interestingly, she had a patulous IC valve on colonoscopy.  There were some small  aphthous ulcers, that I suspect is from backwash ileitis.  Query whether the patulous valve lends to her loose stools.  In any case, bowel habits improved with fiber supplementation.  If less efficacious in the future, reasonable to trial course of cholestyramine -Reasonable to check yearly B12 -Biopsies otherwise negative for [facility], IBD  3) Tubular adenoma: -Repeat colonoscopy in 2027 for ongoing polyp surveillance  4) GERD: -Resolved with Prilosec 40 mg/day -Plan to reduce to Prilosec 20 mg/day when current prescription complete.  If still well controlled, can titrate to qod or even prn -SCL 70 was negative  RTC in 6 to 12 months or sooner as needed     I spent a total of 25 minutes of face-to-face time with the patient. Greater than 50% of the time was spent counseling and coordinating care.        Joanne Jackson ,DO, FACG 11/28/2018, 3:24 PM

## 2018-11-28 NOTE — Patient Instructions (Signed)
You will be due for another colonoscopy in 7 years.  You will receive a letter in the mail reminding you to call to schedule this.  Please follow up as needed

## 2018-12-01 ENCOUNTER — Ambulatory Visit: Payer: BC Managed Care – PPO | Admitting: Obstetrics & Gynecology

## 2018-12-16 ENCOUNTER — Ambulatory Visit: Payer: BC Managed Care – PPO | Admitting: Rheumatology

## 2019-01-14 ENCOUNTER — Other Ambulatory Visit: Payer: Self-pay | Admitting: Gastroenterology

## 2019-01-14 MED ORDER — OMEPRAZOLE 20 MG PO CPDR: 20.0000 mg | DELAYED_RELEASE_CAPSULE | Freq: Every day | ORAL | 3 refills | Status: DC

## 2019-02-15 ENCOUNTER — Other Ambulatory Visit: Payer: Self-pay | Admitting: Rheumatology

## 2019-02-15 ENCOUNTER — Encounter: Payer: Self-pay | Admitting: Rheumatology

## 2019-02-15 DIAGNOSIS — E559 Vitamin D deficiency, unspecified: Secondary | ICD-10-CM

## 2019-02-15 DIAGNOSIS — R5383 Other fatigue: Secondary | ICD-10-CM

## 2019-02-16 NOTE — Addendum Note (Signed)
Addended by: Francis Gaines C on: 02/16/2019 12:01 PM   Modules accepted: Orders

## 2019-02-16 NOTE — Telephone Encounter (Signed)
Okay to add B12 level

## 2019-02-18 DIAGNOSIS — E559 Vitamin D deficiency, unspecified: Secondary | ICD-10-CM | POA: Diagnosis not present

## 2019-02-18 DIAGNOSIS — R768 Other specified abnormal immunological findings in serum: Secondary | ICD-10-CM | POA: Diagnosis not present

## 2019-02-18 DIAGNOSIS — M791 Myalgia, unspecified site: Secondary | ICD-10-CM | POA: Diagnosis not present

## 2019-02-18 DIAGNOSIS — R5383 Other fatigue: Secondary | ICD-10-CM | POA: Diagnosis not present

## 2019-02-19 LAB — VITAMIN B12: Vitamin B-12: 416 pg/mL (ref 232–1245)

## 2019-02-19 LAB — VITAMIN D 25 HYDROXY (VIT D DEFICIENCY, FRACTURES): Vit D, 25-Hydroxy: 39.5 ng/mL (ref 30.0–100.0)

## 2019-02-19 NOTE — Telephone Encounter (Signed)
Vitamin D D and B12 are within normal limits.  Patient should take vitamin D 2000 units over-the-counter daily

## 2019-02-26 ENCOUNTER — Encounter: Payer: Self-pay | Admitting: Rheumatology

## 2019-03-13 ENCOUNTER — Encounter: Payer: Self-pay | Admitting: Rheumatology

## 2019-03-17 DIAGNOSIS — R7309 Other abnormal glucose: Secondary | ICD-10-CM | POA: Diagnosis not present

## 2019-03-17 DIAGNOSIS — H04123 Dry eye syndrome of bilateral lacrimal glands: Secondary | ICD-10-CM | POA: Diagnosis not present

## 2019-03-17 DIAGNOSIS — H524 Presbyopia: Secondary | ICD-10-CM | POA: Diagnosis not present

## 2019-04-21 ENCOUNTER — Encounter: Payer: Self-pay | Admitting: Family Medicine

## 2019-04-22 ENCOUNTER — Other Ambulatory Visit: Payer: Self-pay

## 2019-04-22 ENCOUNTER — Ambulatory Visit (INDEPENDENT_AMBULATORY_CARE_PROVIDER_SITE_OTHER): Payer: BC Managed Care – PPO | Admitting: Family Medicine

## 2019-04-22 ENCOUNTER — Encounter: Payer: Self-pay | Admitting: Family Medicine

## 2019-04-22 VITALS — BP 160/90 | HR 85 | Temp 97.5°F | Ht 60.0 in | Wt 154.6 lb

## 2019-04-22 DIAGNOSIS — I10 Essential (primary) hypertension: Secondary | ICD-10-CM | POA: Diagnosis not present

## 2019-04-22 MED ORDER — LISINOPRIL 40 MG PO TABS: 40.0000 mg | ORAL_TABLET | Freq: Every day | ORAL | 1 refills | Status: DC

## 2019-04-22 NOTE — Patient Instructions (Addendum)
Let's increase your lisinopril to 40mg /day. Keep a log for me daily and if blood pressure is going too low we will need to decrease dosage. May need to adjust medication to get your diastolic down.   See you back in 2-4 weeks.

## 2019-04-22 NOTE — Progress Notes (Signed)
Patient: Joanne Jackson MRN: [id number] DOB: 10-Jan-1976 PCP: Orma Flaming, MD     Subjective:  Chief Complaint  Patient presents with  . Hypertension    Started Monday night, very fatigue. And face and ears warm to the touch.    HPI: The patient is a 44 y.o. female who presents today for possible blood pressure. She states Monday night she felt very tired and couldn't keep her eyes open. She checked her blood pressure and it was 160/110. This was around 10pm. She rechecked it again and it was 140/110. She then increased her lisinopril to 30mg . She checked her blood pressure through the day and her bp ranged from 134/103-163/105. She had a small headache this AM and some nausea. No chest pain, shortness of breath or vision changes. She has not gained weight, denies a lot of sodium in her diet and is exercising only minimally. She is trying to run 22min/day.   Labetalol/nefidipine in pregnancy. Had headache with nifedipine.   Prediabetes: due for her a1c. Needs fasting labs, so will check at f/u.   Review of Systems  Constitutional: Positive for fatigue. Negative for chills and fever.  HENT: Negative for congestion, sinus pain and sore throat.   Respiratory: Negative for cough, chest tightness and shortness of breath.   Cardiovascular: Negative for chest pain, palpitations and leg swelling.  Gastrointestinal: Negative for abdominal pain, diarrhea and nausea.  Neurological: Negative for dizziness, light-headedness and headaches.    Allergies Patient has No Known Allergies.  Past Medical History Patient  has a past medical history of Hypertension and Pre-diabetes.  Surgical History Patient  has a past surgical history that includes Cesarean section and Dilation and curettage, diagnostic / therapeutic.  Family History Pateint's family history includes Healthy in her brother and daughter; Heart attack in her father; Hypertension in her father; Thyroid disease in her mother.  Social  History Patient  reports that she has never smoked. She has never used smokeless tobacco. She reports previous alcohol use. She reports that she does not use drugs.    Objective: Vitals:   04/22/19 1057 04/22/19 1128  BP: (!) 130/92 (!) 160/90  Pulse: 85   Temp: (!) 97.5 F (36.4 C)   TempSrc: Temporal   SpO2: 98%   Weight: 154 lb 9.6 oz (70.1 kg)   Height: 5' (1.524 m)     Body mass index is 30.19 kg/m.  Physical Exam Vitals reviewed.  Constitutional:      Appearance: Normal appearance. She is obese.  HENT:     Head: Normocephalic and atraumatic.  Eyes:     Extraocular Movements: Extraocular movements intact.     Pupils: Pupils are equal, round, and reactive to light.  Cardiovascular:     Rate and Rhythm: Normal rate and regular rhythm.     Heart sounds: Normal heart sounds.  Pulmonary:     Effort: Pulmonary effort is normal.     Breath sounds: Normal breath sounds.  Abdominal:     General: Bowel sounds are normal.     Palpations: Abdomen is soft.  Musculoskeletal:     Cervical back: Normal range of motion and neck supple.  Skin:    General: Skin is warm.  Neurological:     General: No focal deficit present.     Mental Status: She is alert and oriented to person, place, and time.  Psychiatric:        Mood and Affect: Mood normal.        Behavior: Behavior  normal.        Assessment/plan: 1. Essential hypertension Increasing lisinopril to 40mg . Home log and reading today elevated. Her diastolic has never been to goal and discussed I may need to change up medication and add second agent to get this to goal. Will see if increased lisinopril to 40mg  will bring this down. She has had labile blood pressures in the past. Will have her keep log, work on exercise, low salt diet and see me back in 3-4 weeks. Needs fasting labs at that time and a1c recheck for her prediabetes.    This visit occurred during the SARS-CoV-2 public health emergency.  Safety protocols were in  place, including screening questions prior to the visit, additional usage of staff PPE, and extensive cleaning of exam room while observing appropriate contact time as indicated for disinfecting solutions.    Return in about 4 weeks (around 05/20/2019) for blood pressure check .  Orma Flaming, MD [place]   04/22/2019

## 2019-04-23 DIAGNOSIS — H04123 Dry eye syndrome of bilateral lacrimal glands: Secondary | ICD-10-CM | POA: Diagnosis not present

## 2019-05-18 NOTE — Progress Notes (Signed)
Office Visit Note  Patient: Joanne Jackson             Date of Birth: January 05, 1976           MRN: [id number]             PCP: Orma Flaming, MD Referring: Orma Flaming, MD Visit Date: 05/26/2019 Occupation: '@GUAROCC'$ @  Subjective:  Routine follow up   History of Present Illness: Joanne Jackson is a 44 y.o. female with history of positive ANA and osteoarthritis.  Patient reports that she continues to have chronic sicca symptoms which have been tolerable.  She states that she uses Restasis eyedrops and drinks fluids for symptomatic relief.  She denies any recent rashes, hair loss, photosensitivity, symptoms of Raynaud's, enlarged lymph nodes, chest pain, or shortness of breath.  She denies any joint pain or joint swelling at this time.  She states that her myalgias have resolved.   Activities of Daily Living:  Patient reports morning stiffness for 1-2 minutes.   Patient Denies nocturnal pain.  Difficulty dressing/grooming: Denies Difficulty climbing stairs: Denies Difficulty getting out of chair: Denies Difficulty using hands for taps, buttons, cutlery, and/or writing: Denies  Review of Systems  Constitutional: Positive for fatigue.  HENT: Positive for mouth dryness. Negative for mouth sores and nose dryness.   Eyes: Positive for dryness. Negative for pain and itching.  Respiratory: Negative for shortness of breath and difficulty breathing.   Cardiovascular: Negative for chest pain and palpitations.  Gastrointestinal: Negative for blood in stool, constipation and diarrhea.  Endocrine: Negative for increased urination.  Genitourinary: Negative for difficulty urinating.  Musculoskeletal: Positive for morning stiffness. Negative for arthralgias, joint pain, joint swelling, myalgias, muscle tenderness and myalgias.  Skin: Negative for color change, rash and redness.  Allergic/Immunologic: Negative for susceptible to infections.  Neurological: Positive for numbness and headaches. Negative  for dizziness, memory loss and weakness.  Hematological: Negative for bruising/bleeding tendency.  Psychiatric/Behavioral: Negative for confusion.    PMFS History:  Patient Active Problem List   Diagnosis Date Noted  . Tubular adenoma of colon 10/24/2018  . Essential hypertension 12/26/2017  . Prediabetes 12/26/2017    Past Medical History:  Diagnosis Date  . Hypertension   . Pre-diabetes     Family History  Problem Relation Age of Onset  . Thyroid disease Mother   . Heart attack Father   . Hypertension Father   . Healthy Brother   . Healthy Daughter   . Colon cancer Neg Hx    Past Surgical History:  Procedure Laterality Date  . CESAREAN SECTION    . DILATION AND CURETTAGE, DIAGNOSTIC / THERAPEUTIC     Social History   Social History Narrative  . Not on file   Immunization History  Administered Date(s) Administered  . Influenza,inj,Quad PF,6+ Mos 10/24/2018     Objective: Vital Signs: BP (!) 144/102 (BP Location: Left Arm, Patient Position: Sitting, Cuff Size: Normal)   Pulse 83   Resp 13   Ht '4\' 11"'$  (1.499 m)   Wt 161 lb (73 kg)   BMI 32.52 kg/m    Physical Exam Vitals and nursing note reviewed.  Constitutional:      Appearance: She is well-developed.  HENT:     Head: Normocephalic and atraumatic.  Eyes:     Conjunctiva/sclera: Conjunctivae normal.  Pulmonary:     Effort: Pulmonary effort is normal.  Abdominal:     General: Bowel sounds are normal.     Palpations: Abdomen is soft.  Musculoskeletal:  Cervical back: Normal range of motion.  Lymphadenopathy:     Cervical: No cervical adenopathy.  Skin:    General: Skin is warm and dry.     Capillary Refill: Capillary refill takes less than 2 seconds.  Neurological:     Mental Status: She is alert and oriented to person, place, and time.  Psychiatric:        Behavior: Behavior normal.      Musculoskeletal Exam: C-spine, thoracic spine, lumbar spine good range of motion.  No midline spinal  tenderness.  No SI joint tenderness.  Shoulder joints, joints, strength, MCPs, PIPs and DIPs good range of motion with no synovitis.  She has complete fist formation bilaterally.  Hip joints, knee joints and ankle joints have good range of motion with no discomfort.  No warmth or effusion of bilateral knee joints.  She has left knee crepitus.  Ankle joints have no tenderness or inflammation at this time.  No tenderness over trochanteric bursa bilaterally.  CDAI Exam: CDAI Score: -- Patient Global: --; Provider Global: -- Swollen: --; Tender: -- Joint Exam 05/26/2019   No joint exam has been documented for this visit   There is currently no information documented on the homunculus. Go to the Rheumatology activity and complete the homunculus joint exam.  Investigation: No additional findings.  Imaging: No results found.  Recent Labs: Lab Results  Component Value Date   WBC 4.9 05/21/2019   HGB 12.6 05/21/2019   PLT 231.0 05/21/2019   NA 135 05/21/2019   K 3.7 05/21/2019   CL 101 05/21/2019   CO2 27 05/21/2019   GLUCOSE 93 05/21/2019   BUN 12 05/21/2019   CREATININE 0.56 05/21/2019   BILITOT 0.6 05/21/2019   ALKPHOS 54 05/21/2019   AST 17 05/21/2019   ALT 15 05/21/2019   PROT 7.0 05/21/2019   ALBUMIN 4.2 05/21/2019   CALCIUM 9.0 05/21/2019    Speciality Comments: No specialty comments available.  Procedures:  No procedures performed Allergies: Patient has no known allergies.   Assessment / Plan:     Visit Diagnoses: Positive ANA (antinuclear antibody) - TSH 0.75, HIV-, ANA 1:160 NS, RF<14, sed rate 26, CRP<1, B burgdorferi Ab-, Ro+, La-, dsDNA-, HLA-B27-, sm-, RNP-, scl-70-, complements WNL: She has not developed any new or worsening signs or symptoms of autoimmune disease.  She is not taking any immunosuppressive agents at this time.  She continues to have chronic sicca symptoms which have been tolerable with using Restasis as well as drinking fluids.  She has not had any  oral or nasal ulcerations.  She has not had any recent rashes, photosensitivity, or hair loss.  No Maller rash was noted on exam.  She has not had any symptoms of Raynaud's.  No digital ulcerations or signs of gangrene were noted.  She has not had any enlarged lymph nodes, fatigue, or fevers recently.  She had an EKG performed on 01/01/2018.  She has not had any chest pain, shortness of breath, or palpitations recently.  We will recheck the following autoimmune lab work today.  She does not require immunosuppressive therapy at this time.  We discussed over-the-counter products for symptomatic relief for sicca symptoms.  She was advised to notify us if she develops any new or worsening symptoms.  She will follow-up in the office in 6 months.- Plan: ANA, Sjogrens syndrome-A extractable nuclear antibody, Urinalysis, Routine w reflex microscopic, COMPLETE METABOLIC PANEL WITH GFR, CBC with Differential/Platelet, Anti-DNA antibody, double-stranded, C3 and C4, Sedimentation rate  Myalgia: Resolved.  She has not had any myalgias, muscle tenderness, muscle weakness recently.  CK was 53 on 11/19/2019.  Paresthesia of both hands: Resolved.  Vitamin D deficiency: Vitamin D level was 39.5 on 02/18/2019.  She continues to take vitamin D 2000 units daily.  Other fatigue: Resolved.  Chronic pain of both knees: She has good range of motion of both knee joints on exam.  No warmth or effusion was noted.  She has left knee crepitus on exam.  She has no difficulty climbing steps or getting up from a seated position.  Essential hypertension: Her blood pressure was 144/102 today in the office.  Patient reports that her PCP recently increased her dose of lisinopril to 40 mg 1 tablet by mouth daily.  She was advised to monitor her blood pressure closely at home.  She was advised to follow-up with her PCP if her blood pressure continues to remain elevated.  Other medical conditions are listed as  follows:  Prediabetes  Tubular adenoma of colon  Orders: Orders Placed This Encounter  Procedures  . ANA  . Sjogrens syndrome-A extractable nuclear antibody  . Urinalysis, Routine w reflex microscopic  . COMPLETE METABOLIC PANEL WITH GFR  . CBC with Differential/Platelet  . Anti-DNA antibody, double-stranded  . C3 and C4  . Sedimentation rate   No orders of the defined types were placed in this encounter.    Follow-Up Instructions: Return in about 6 months (around 11/25/2019) for Positive ANA.   Ofilia Neas, PA-C  Note - This record has been created using Dragon software.  Chart creation errors have been sought, but may not always  have been located. Such creation errors do not reflect on  the standard of medical care.

## 2019-05-21 ENCOUNTER — Other Ambulatory Visit: Payer: Self-pay

## 2019-05-21 ENCOUNTER — Ambulatory Visit (INDEPENDENT_AMBULATORY_CARE_PROVIDER_SITE_OTHER): Payer: BC Managed Care – PPO | Admitting: Family Medicine

## 2019-05-21 ENCOUNTER — Encounter: Payer: Self-pay | Admitting: Family Medicine

## 2019-05-21 VITALS — BP 128/86 | HR 64 | Temp 97.8°F | Ht 60.0 in | Wt 158.0 lb

## 2019-05-21 DIAGNOSIS — I10 Essential (primary) hypertension: Secondary | ICD-10-CM | POA: Diagnosis not present

## 2019-05-21 DIAGNOSIS — G5603 Carpal tunnel syndrome, bilateral upper limbs: Secondary | ICD-10-CM | POA: Diagnosis not present

## 2019-05-21 DIAGNOSIS — R7303 Prediabetes: Secondary | ICD-10-CM

## 2019-05-21 LAB — CBC WITH DIFFERENTIAL/PLATELET
Basophils Absolute: 0 10*3/uL (ref 0.0–0.1)
Basophils Relative: 0.4 % (ref 0.0–3.0)
Eosinophils Absolute: 0.1 10*3/uL (ref 0.0–0.7)
Eosinophils Relative: 2.1 % (ref 0.0–5.0)
HCT: 37.3 % (ref 36.0–46.0)
Hemoglobin: 12.6 g/dL (ref 12.0–15.0)
Lymphocytes Relative: 39.7 % (ref 12.0–46.0)
Lymphs Abs: 1.9 10*3/uL (ref 0.7–4.0)
MCHC: 33.6 g/dL (ref 30.0–36.0)
MCV: 80.8 fl (ref 78.0–100.0)
Monocytes Absolute: 0.4 10*3/uL (ref 0.1–1.0)
Monocytes Relative: 8.2 % (ref 3.0–12.0)
Neutro Abs: 2.4 10*3/uL (ref 1.4–7.7)
Neutrophils Relative %: 49.6 % (ref 43.0–77.0)
Platelets: 231 10*3/uL (ref 150.0–400.0)
RBC: 4.62 Mil/uL (ref 3.87–5.11)
RDW: 14.8 % (ref 11.5–15.5)
WBC: 4.9 10*3/uL (ref 4.0–10.5)

## 2019-05-21 LAB — COMPREHENSIVE METABOLIC PANEL
ALT: 15 U/L (ref 0–35)
AST: 17 U/L (ref 0–37)
Albumin: 4.2 g/dL (ref 3.5–5.2)
Alkaline Phosphatase: 54 U/L (ref 39–117)
BUN: 12 mg/dL (ref 6–23)
CO2: 27 mEq/L (ref 19–32)
Calcium: 9 mg/dL (ref 8.4–10.5)
Chloride: 101 mEq/L (ref 96–112)
Creatinine, Ser: 0.56 mg/dL (ref 0.40–1.20)
GFR: 118.44 mL/min (ref >60.00)
Glucose, Bld: 93 mg/dL (ref 70–99)
Potassium: 3.7 mEq/L (ref 3.5–5.1)
Sodium: 135 mEq/L (ref 135–145)
Total Bilirubin: 0.6 mg/dL (ref 0.2–1.2)
Total Protein: 7 g/dL (ref 6.0–8.3)

## 2019-05-21 LAB — LIPID PANEL
Cholesterol: 150 mg/dL (ref 0–200)
HDL: 37.4 mg/dL — ABNORMAL LOW (ref >39.00)
NonHDL: 112.28
Total CHOL/HDL Ratio: 4
Triglycerides: 212 mg/dL — ABNORMAL HIGH (ref 0.0–149.0)
VLDL: 42.4 mg/dL — ABNORMAL HIGH (ref 0.0–40.0)

## 2019-05-21 LAB — LDL CHOLESTEROL, DIRECT: Direct LDL: 86 mg/dL

## 2019-05-21 LAB — HEMOGLOBIN A1C: Hgb A1c MFr Bld: 5.9 % (ref 4.6–6.5)

## 2019-05-21 NOTE — Progress Notes (Signed)
Patient: Joanne Jackson MRN: [id number] DOB: 11-20-1975 PCP: Orma Flaming, MD     Subjective:  Chief Complaint  Patient presents with  . Hypertension  . Prediabetes    HPI: The patient is a 44 y.o. female who presents today for hypertension and prediabetes follow up.   Hypertension: Here for follow up of hypertension.  Currently on lisinopril 40mg , we increased this at her lats visit. She is tolerating well.  Home readings range from [id number] diastolic. Takes medication as prescribed and denies any side effects. Exercise includes nothing. Weight has been increasing. She has gained 4 pounds in the past month. Denies any chest pain, headaches, shortness of breath, vision changes, swelling in lower extremities. She is doing overall well. She has had a few outlying high readings to 160/100 in the beginning of increased dosage, but over last 1-2 weeks BP readings have been normal. She denies any episodes of sweating, palpitations, high blood pressure or headaches.    Prediabetes: due for labs today. Has not been exercising and is gaining weight. Also needs LDL as this was not checked at last lab draw. She is fasting.   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 Does wear splints at night and this helps. Not using anything else.   Review of Systems  Constitutional: Negative for chills, fatigue and fever.  HENT: Negative for dental problem, ear pain, hearing loss and trouble swallowing.   Eyes: Negative for visual disturbance.  Respiratory: Negative for cough, chest tightness and shortness of breath.   Cardiovascular: Negative for chest pain, palpitations and leg swelling.  Gastrointestinal: Negative for abdominal pain, blood in stool, diarrhea and nausea.  Endocrine: Negative for cold intolerance, polydipsia, polyphagia and polyuria.  Genitourinary: Negative for dysuria and hematuria.  Musculoskeletal: Negative for arthralgias.  Skin: Negative for rash.   Neurological: Negative for dizziness and headaches.  Psychiatric/Behavioral: Negative for dysphoric mood and sleep disturbance. The patient is not nervous/anxious.     Allergies Patient has No Known Allergies.  Past Medical History Patient  has a past medical history of Hypertension and Pre-diabetes.  Surgical History Patient  has a past surgical history that includes Cesarean section and Dilation and curettage, diagnostic / therapeutic.  Family History Pateint's family history includes Healthy in her brother and daughter; Heart attack in her father; Hypertension in her father; Thyroid disease in her mother.  Social History Patient  reports that she has never smoked. She has never used smokeless tobacco. She reports previous alcohol use. She reports that she does not use drugs.    Objective: Vitals:   05/21/19 0817 05/21/19 0837  BP: 132/90 128/86  Pulse: 64   Temp: 97.8 F (36.6 C)   TempSrc: Temporal   SpO2: 96%   Weight: 158 lb (71.7 kg)   Height: 5' (1.524 m)     Body mass index is 30.86 kg/m.  Physical Exam Vitals reviewed.  Constitutional:      Appearance: Normal appearance. She is obese.  HENT:     Head: Normocephalic.  Neck:     Vascular: No carotid bruit.  Cardiovascular:     Rate and Rhythm: Normal rate and regular rhythm.     Heart sounds: Normal heart sounds.  Pulmonary:     Effort: Pulmonary effort is normal.     Breath sounds: Normal breath sounds.  Abdominal:     General: Abdomen is flat. Bowel sounds are normal.     Palpations: Abdomen is soft.  Musculoskeletal:  Cervical back: Normal range of motion and neck supple.  Skin:    General: Skin is warm.     Capillary Refill: Capillary refill takes less than 2 seconds.  Neurological:     General: No focal deficit present.     Mental Status: She is alert and oriented to person, place, and time.  Psychiatric:        Mood and Affect: Mood normal.        Behavior: Behavior normal.         Assessment/plan: 1. Essential hypertension Blood pressure is to goal. Continue current anti-hypertensive medications. Refills not given and routine lab work will be done today. Recommended routine exercise and healthy diet including DASH diet and mediterranean diet. Encouraged weight loss. F/u in 6 months. She is to let me know if diastolic living at [id number] or if having labile spike with symptoms.  - CBC with Differential/Platelet - Comprehensive metabolic panel - Lipid panel - Microalbumin / creatinine urine ratio - TSH  2. Prediabetes -really encouraged weight loss and exercise.  - Hemoglobin A1c  3. Carpal tunnel -recommended voltaren gel to wrists with splints. If not getting better she is to let me know so we can send to ortho for consult on surgery/EMG testing.   This visit occurred during the SARS-CoV-2 public health emergency.  Safety protocols were in place, including screening questions prior to the visit, additional usage of staff PPE, and extensive cleaning of exam room while observing appropriate contact time as indicated for disinfecting solutions.     Return in about 6 months (around 11/20/2019) for hypertension/prediabetes .     Orma Flaming, MD [place]  05/21/2019

## 2019-05-21 NOTE — Progress Notes (Deleted)
Patient: Joanne Jackson MRN: [id number] DOB: 10-Aug-1975 PCP: Orma Flaming, MD     Subjective:  Chief Complaint  Patient presents with  . Hypertension  . Prediabetes    HPI: The patient is a 44 y.o. female who presents today for ***  Review of Systems  Respiratory: Negative for cough, shortness of breath and wheezing.   Cardiovascular: Negative for chest pain and leg swelling.  Neurological: Positive for light-headedness. Negative for dizziness and headaches.    Allergies Patient has No Known Allergies.  Past Medical History Patient  has a past medical history of Hypertension and Pre-diabetes.  Surgical History Patient  has a past surgical history that includes Cesarean section and Dilation and curettage, diagnostic / therapeutic.  Family History Pateint's family history includes Healthy in her brother and daughter; Heart attack in her father; Hypertension in her father; Thyroid disease in her mother.  Social History Patient  reports that she has never smoked. She has never used smokeless tobacco. She reports previous alcohol use. She reports that she does not use drugs.    Objective: Vitals:   05/21/19 0817 05/21/19 0837  BP: 132/90 128/86  Pulse: 64   Temp: 97.8 F (36.6 C)   TempSrc: Temporal   SpO2: 96%   Weight: 158 lb (71.7 kg)   Height: 5' (1.524 m)     Body mass index is 30.86 kg/m.  Physical Exam     Assessment/plan:      Return in about 6 months (around 11/20/2019) for hypertension/prediabetes .     @[facility] @ 05/21/2019

## 2019-05-21 NOTE — Patient Instructions (Signed)
We will keep up the lisinopril 40mg /day. Continue with log. Just check once/day. Could try medication in the AM. Let me know if blood pressure on bottom is living at >90. Any issues let me know or come back in, otherwise let's check up on you in 6 months time.   Checking labs today.   Really work on exercise/weight loss!!!! im proud of you!  Dr. Rogers Blocker

## 2019-05-26 ENCOUNTER — Encounter: Payer: Self-pay | Admitting: Physician Assistant

## 2019-05-26 ENCOUNTER — Other Ambulatory Visit: Payer: Self-pay

## 2019-05-26 ENCOUNTER — Ambulatory Visit: Payer: BC Managed Care – PPO | Admitting: Physician Assistant

## 2019-05-26 VITALS — BP 144/102 | HR 83 | Resp 13 | Ht 59.0 in | Wt 161.0 lb

## 2019-05-26 DIAGNOSIS — R7303 Prediabetes: Secondary | ICD-10-CM

## 2019-05-26 DIAGNOSIS — M25561 Pain in right knee: Secondary | ICD-10-CM

## 2019-05-26 DIAGNOSIS — E559 Vitamin D deficiency, unspecified: Secondary | ICD-10-CM | POA: Diagnosis not present

## 2019-05-26 DIAGNOSIS — M25562 Pain in left knee: Secondary | ICD-10-CM

## 2019-05-26 DIAGNOSIS — R202 Paresthesia of skin: Secondary | ICD-10-CM | POA: Diagnosis not present

## 2019-05-26 DIAGNOSIS — I10 Essential (primary) hypertension: Secondary | ICD-10-CM

## 2019-05-26 DIAGNOSIS — D126 Benign neoplasm of colon, unspecified: Secondary | ICD-10-CM

## 2019-05-26 DIAGNOSIS — G8929 Other chronic pain: Secondary | ICD-10-CM

## 2019-05-26 DIAGNOSIS — M791 Myalgia, unspecified site: Secondary | ICD-10-CM | POA: Diagnosis not present

## 2019-05-26 DIAGNOSIS — R768 Other specified abnormal immunological findings in serum: Secondary | ICD-10-CM | POA: Diagnosis not present

## 2019-05-26 DIAGNOSIS — R5383 Other fatigue: Secondary | ICD-10-CM

## 2019-05-28 LAB — ANTI-NUCLEAR AB-TITER (ANA TITER): ANA Titer 1: 1:320 {titer} — ABNORMAL HIGH

## 2019-05-28 LAB — C3 AND C4
C3 Complement: 142 mg/dL (ref 83–193)
C4 Complement: 20 mg/dL (ref 15–57)

## 2019-05-28 LAB — URINALYSIS, ROUTINE W REFLEX MICROSCOPIC
Bilirubin Urine: NEGATIVE
Glucose, UA: NEGATIVE
Hgb urine dipstick: NEGATIVE
Ketones, ur: NEGATIVE
Leukocytes,Ua: NEGATIVE
Nitrite: NEGATIVE
Protein, ur: NEGATIVE
Specific Gravity, Urine: 1.006 (ref 1.001–1.03)
pH: 7 (ref 5.0–8.0)

## 2019-05-28 LAB — CBC WITH DIFFERENTIAL/PLATELET
Absolute Monocytes: 312 cells/uL (ref 200–950)
Basophils Absolute: 11 cells/uL (ref 0–200)
Basophils Relative: 0.3 %
Eosinophils Absolute: 61 cells/uL (ref 15–500)
Eosinophils Relative: 1.6 %
HCT: 40.5 % (ref 35.0–45.0)
Hemoglobin: 13 g/dL (ref 11.7–15.5)
Lymphs Abs: 1497 cells/uL (ref 850–3900)
MCH: 26.5 pg — ABNORMAL LOW (ref 27.0–33.0)
MCHC: 32.1 g/dL (ref 32.0–36.0)
MCV: 82.7 fL (ref 80.0–100.0)
MPV: 10 fL (ref 7.5–12.5)
Monocytes Relative: 8.2 %
Neutro Abs: 1919 cells/uL (ref 1500–7800)
Neutrophils Relative %: 50.5 %
Platelets: 251 10*3/uL (ref 140–400)
RBC: 4.9 10*6/uL (ref 3.80–5.10)
RDW: 14.5 % (ref 11.0–15.0)
Total Lymphocyte: 39.4 %
WBC: 3.8 10*3/uL (ref 3.8–10.8)

## 2019-05-28 LAB — COMPLETE METABOLIC PANEL WITH GFR
AG Ratio: 1.4 (calc) (ref 1.0–2.5)
ALT: 16 U/L (ref 6–29)
AST: 18 U/L (ref 10–30)
Albumin: 4.3 g/dL (ref 3.6–5.1)
Alkaline phosphatase (APISO): 53 U/L (ref 31–125)
BUN: 11 mg/dL (ref 7–25)
CO2: 26 mmol/L (ref 20–32)
Calcium: 9.8 mg/dL (ref 8.6–10.2)
Chloride: 104 mmol/L (ref 98–110)
Creat: 0.62 mg/dL (ref 0.50–1.10)
GFR, Est African American: 129 mL/min/{1.73_m2} (ref >=60)
GFR, Est Non African American: 111 mL/min/{1.73_m2} (ref >=60)
Globulin: 3.1 g/dL (calc) (ref 1.9–3.7)
Glucose, Bld: 96 mg/dL (ref 65–99)
Potassium: 4.1 mmol/L (ref 3.5–5.3)
Sodium: 138 mmol/L (ref 135–146)
Total Bilirubin: 0.4 mg/dL (ref 0.2–1.2)
Total Protein: 7.4 g/dL (ref 6.1–8.1)

## 2019-05-28 LAB — ANTI-DNA ANTIBODY, DOUBLE-STRANDED: ds DNA Ab: <1 IU/mL

## 2019-05-28 LAB — SEDIMENTATION RATE: Sed Rate: 14 mm/h (ref 0–20)

## 2019-05-28 LAB — ANA: Anti Nuclear Antibody (ANA): POSITIVE — AB

## 2019-05-28 LAB — SJOGRENS SYNDROME-A EXTRACTABLE NUCLEAR ANTIBODY: SSA (Ro) (ENA) Antibody, IgG: >8 AI — AB

## 2019-05-28 NOTE — Progress Notes (Signed)
ANA and Ro antibody remain positive but stable. DsDNA negative. Complements WNL.  ESR WNL.  UA normal. CMP and CBC WNL.

## 2019-07-27 DIAGNOSIS — H04123 Dry eye syndrome of bilateral lacrimal glands: Secondary | ICD-10-CM | POA: Diagnosis not present

## 2019-09-24 ENCOUNTER — Other Ambulatory Visit: Payer: Self-pay | Admitting: Family Medicine

## 2019-11-10 NOTE — Progress Notes (Signed)
Office Visit Note  Patient: Joanne Jackson             Date of Birth: July 22, 1975           MRN: [id number]             PCP: Orma Flaming, MD Referring: Orma Flaming, MD Visit Date: 11/24/2019 Occupation: _0 @  Subjective:  Paresthesias in both hands  History of Present Illness: Jeri Jeanbaptiste is a 44 y.o. female with history of positive ANA.  She denies any new questions or concerns since her last office visit.  She states that her overall arthralgias and myalgias have improved.  She is not experiencing any joint pain or swelling in her knee joints at this time.  She continues to have intermittent paresthesias in both hands.  She has been wearing her carpal tunnel braces at night which have been helpful.  She states that her symptoms are exacerbated by talking on the phone and doing housework.  She denies any weakness in her hands at this time. She denies any rashes, photosensitivity, increased hair loss, symptoms of Raynaud's, oral or nasal ulcerations, enlarged lymph nodes, chest pain, or shortness of breath.  She continues to experience chronic sicca symptoms.  She uses eyedrops as needed for symptomatic relief.   Activities of Daily Living:  Patient reports morning stiffness for  A few minutes.   Patient Denies nocturnal pain.  Difficulty dressing/grooming: Denies Difficulty climbing stairs: Denies Difficulty getting out of chair: Denies Difficulty using hands for taps, buttons, cutlery, and/or writing: Denies  Review of Systems  Constitutional: Positive for fatigue.  HENT: Positive for mouth dryness. Negative for mouth sores and nose dryness.   Eyes: Positive for dryness. Negative for pain and visual disturbance.  Respiratory: Negative for cough, hemoptysis, shortness of breath and difficulty breathing.   Cardiovascular: Negative for chest pain, palpitations, hypertension and swelling in legs/feet.  Gastrointestinal: Negative for blood in stool, constipation and diarrhea.    Endocrine: Negative for increased urination.  Genitourinary: Negative for painful urination.  Musculoskeletal: Positive for myalgias, morning stiffness and myalgias. Negative for arthralgias, joint pain, joint swelling, muscle weakness and muscle tenderness.  Skin: Negative for color change, pallor, rash, hair loss, nodules/bumps, skin tightness, ulcers and sensitivity to sunlight.  Allergic/Immunologic: Negative for susceptible to infections.  Neurological: Positive for numbness. Negative for dizziness, headaches and weakness.  Hematological: Negative for swollen glands.  Psychiatric/Behavioral: Negative for depressed mood and sleep disturbance. The patient is not nervous/anxious.     PMFS History:  Patient Active Problem List   Diagnosis Date Noted  . Tubular adenoma of colon 10/24/2018  . Essential hypertension 12/26/2017  . Prediabetes 12/26/2017    Past Medical History:  Diagnosis Date  . Hypertension   . Pre-diabetes     Family History  Problem Relation Age of Onset  . Thyroid disease Mother   . Heart attack Father   . Hypertension Father   . Healthy Brother   . Healthy Daughter   . Colon cancer Neg Hx    Past Surgical History:  Procedure Laterality Date  . CESAREAN SECTION    . DILATION AND CURETTAGE, DIAGNOSTIC / THERAPEUTIC     Social History   Social History Narrative  . Not on file   Immunization History  Administered Date(s) Administered  . Influenza,inj,Quad PF,6+ Mos 10/24/2018     Objective: Vital Signs: BP 131/85 (BP Location: Left Arm, Patient Position: Sitting, Cuff Size: Normal)   Pulse 77   Resp 15  Ht 4\' 11"  (1.499 m)   Wt 165 lb (74.8 kg)   BMI 33.33 kg/m    Physical Exam Vitals and nursing note reviewed.  Constitutional:      Appearance: She is well-developed.  HENT:     Head: Normocephalic and atraumatic.  Eyes:     Conjunctiva/sclera: Conjunctivae normal.  Pulmonary:     Effort: Pulmonary effort is normal.  Abdominal:      Palpations: Abdomen is soft.  Musculoskeletal:     Cervical back: Normal range of motion.  Skin:    General: Skin is warm and dry.     Capillary Refill: Capillary refill takes less than 2 seconds.  Neurological:     Mental Status: She is alert and oriented to person, place, and time.  Psychiatric:        Behavior: Behavior normal.      Musculoskeletal Exam: C-spine, thoracic spine, lumbar spine have good range of motion with no discomfort.  Shoulder joints, elbow joints, wrist joints, MCPs, PIPs, and DIPs have good range of motion with no synovitis.  She has complete fist formation bilaterally.  Positive Tinel sign at the right wrist.  Hip joints have good range of motion with no discomfort.  Knee joints have good range of motion with no warmth or effusion.  Ankle joints have good range of motion with no tenderness or inflammation.  CDAI Exam: CDAI Score: -- Patient Global: --; Provider Global: -- Swollen: --; Tender: -- Joint Exam 11/24/2019   No joint exam has been documented for this visit   There is currently no information documented on the homunculus. Go to the Rheumatology activity and complete the homunculus joint exam.  Investigation: No additional findings.  Imaging: No results found.  Recent Labs: Lab Results  Component Value Date   WBC 3.8 05/26/2019   HGB 13.0 05/26/2019   PLT 251 05/26/2019   NA 138 05/26/2019   K 4.1 05/26/2019   CL 104 05/26/2019   CO2 26 05/26/2019   GLUCOSE 96 05/26/2019   BUN 11 05/26/2019   CREATININE 0.62 05/26/2019   BILITOT 0.4 05/26/2019   ALKPHOS 54 05/21/2019   AST 18 05/26/2019   ALT 16 05/26/2019   PROT 7.4 05/26/2019   ALBUMIN 4.2 05/21/2019   CALCIUM 9.8 05/26/2019   GFRAA 129 05/26/2019    Speciality Comments: No specialty comments available.  Procedures:  No procedures performed Allergies: Patient has no known allergies.   Assessment / Plan:     Visit Diagnoses: Positive ANA (antinuclear antibody) - ANA  1:160 NS, RF<14, sed rate 26, Ro+: She experiences intermittent sicca symptoms but has not had any other new or worsening.  We discussed the use of over-the-counter products.  She has no clinical features of systemic lupus at this time.  Lab work from 05/26/2019 was reviewed with the patient today in the office: Double-stranded DNA negative, complements within normal limits, ESR within normal limits.  She does not require immunosuppressive therapy at this time.  We will recheck the following lab work today.  She was advised to notify 05/28/2019 if she develops any new or worsening symptoms.  She will follow up in 6 months.- Plan: CBC with Differential/Platelet, COMPLETE METABOLIC PANEL WITH GFR, Anti-DNA antibody, double-stranded, C3 and C4, Sedimentation rate, Urinalysis, Routine w reflex microscopic, Sjogrens syndrome-A extractable nuclear antibody, ANA  Sicca syndrome (HCC) - +ANA, +Ro: She has ongoing sicca symptoms.  She uses OTC products for symptomatic relief.  She sees her ophthalmologist every 6 months.  She was encouraged to try using biotene or xylimelt products as needed.  She can also purchase a humidifier to use.  We will check ANA and Ro antibody today.  She was advised to notify us if she develops any new or worsening symptoms. Plan: Sjogrens syndrome-A extractable nuclear antibody, ANA  Myalgia: Improved.  Her generalized myalgias and arthralgias have improved significantly. She is not experiencing any muscle tenderness or muscle weakness at this time.  We discussed the importance of regular exercise.   Paresthesia of both hands - She experiences intermittent paresthesias in both hands. Positive tinel's sign at right wrist.  Negative Phalen's sign.  We discussed the importance of avoiding overuse activities. She was encouraged to continue to wear a carpal tunnel night splint nightly. She was advised to notify us if she develops any new or worsening symptoms.   Vitamin D deficiency: She is taking  vitamin D 2,000 units daily.  Her vitamin D level was WNL-39/5 on 02/18/19.   Other fatigue - Improved.  Her energy level has improved and she has been sleeping well at night.   Chronic pain of both knees - X-rays of both knee joints were unremarkable on 11/25/18.  She is not experiencing any knee joint pain or stiffness at this time.  No warmth or effusion noted on exam today.    Other medical conditions are listed as follows:   Prediabetes  Essential hypertension: BP was 131/85 today in the office.   Tubular adenoma of colon    Orders: Orders Placed This Encounter  Procedures  . CBC with Differential/Platelet  . COMPLETE METABOLIC PANEL WITH GFR  . Anti-DNA antibody, double-stranded  . C3 and C4  . Sedimentation rate  . Urinalysis, Routine w reflex microscopic  . Sjogrens syndrome-A extractable nuclear antibody  . ANA   No orders of the defined types were placed in this encounter.    Follow-Up Instructions: Return in about 6 months (around 05/24/2020) for +ANA.   Ofilia Neas, PA-C  Note - This record has been created using Dragon software.  Chart creation errors have been sought, but may not always  have been located. Such creation errors do not reflect on  the standard of medical care.

## 2019-11-23 ENCOUNTER — Ambulatory Visit: Payer: BC Managed Care – PPO | Admitting: Family Medicine

## 2019-11-24 ENCOUNTER — Encounter: Payer: Self-pay | Admitting: Physician Assistant

## 2019-11-24 ENCOUNTER — Other Ambulatory Visit: Payer: Self-pay

## 2019-11-24 ENCOUNTER — Ambulatory Visit: Payer: BC Managed Care – PPO | Admitting: Physician Assistant

## 2019-11-24 VITALS — BP 131/85 | HR 77 | Resp 15 | Ht 59.0 in | Wt 165.0 lb

## 2019-11-24 DIAGNOSIS — R7303 Prediabetes: Secondary | ICD-10-CM

## 2019-11-24 DIAGNOSIS — D126 Benign neoplasm of colon, unspecified: Secondary | ICD-10-CM

## 2019-11-24 DIAGNOSIS — R768 Other specified abnormal immunological findings in serum: Secondary | ICD-10-CM

## 2019-11-24 DIAGNOSIS — M3500 Sicca syndrome, unspecified: Secondary | ICD-10-CM | POA: Diagnosis not present

## 2019-11-24 DIAGNOSIS — M25562 Pain in left knee: Secondary | ICD-10-CM

## 2019-11-24 DIAGNOSIS — G8929 Other chronic pain: Secondary | ICD-10-CM

## 2019-11-24 DIAGNOSIS — E559 Vitamin D deficiency, unspecified: Secondary | ICD-10-CM

## 2019-11-24 DIAGNOSIS — R202 Paresthesia of skin: Secondary | ICD-10-CM

## 2019-11-24 DIAGNOSIS — I10 Essential (primary) hypertension: Secondary | ICD-10-CM

## 2019-11-24 DIAGNOSIS — M25561 Pain in right knee: Secondary | ICD-10-CM

## 2019-11-24 DIAGNOSIS — M791 Myalgia, unspecified site: Secondary | ICD-10-CM

## 2019-11-24 DIAGNOSIS — R5383 Other fatigue: Secondary | ICD-10-CM

## 2019-11-24 NOTE — Patient Instructions (Signed)
Biotene products and Xylimelt for dry mouth

## 2019-11-26 LAB — COMPLETE METABOLIC PANEL WITH GFR
AG Ratio: 1.3 (calc) (ref 1.0–2.5)
ALT: 23 U/L (ref 6–29)
AST: 21 U/L (ref 10–30)
Albumin: 4 g/dL (ref 3.6–5.1)
Alkaline phosphatase (APISO): 48 U/L (ref 31–125)
BUN: 14 mg/dL (ref 7–25)
CO2: 26 mmol/L (ref 20–32)
Calcium: 9.2 mg/dL (ref 8.6–10.2)
Chloride: 103 mmol/L (ref 98–110)
Creat: 0.5 mg/dL (ref 0.50–1.10)
GFR, Est African American: 138 mL/min/{1.73_m2} (ref >=60)
GFR, Est Non African American: 119 mL/min/{1.73_m2} (ref >=60)
Globulin: 3 g/dL (calc) (ref 1.9–3.7)
Glucose, Bld: 131 mg/dL — ABNORMAL HIGH (ref 65–99)
Potassium: 3.9 mmol/L (ref 3.5–5.3)
Sodium: 135 mmol/L (ref 135–146)
Total Bilirubin: 0.6 mg/dL (ref 0.2–1.2)
Total Protein: 7 g/dL (ref 6.1–8.1)

## 2019-11-26 LAB — CBC WITH DIFFERENTIAL/PLATELET
Absolute Monocytes: 317 cells/uL (ref 200–950)
Basophils Absolute: 22 cells/uL (ref 0–200)
Basophils Relative: 0.6 %
Eosinophils Absolute: 79 cells/uL (ref 15–500)
Eosinophils Relative: 2.2 %
HCT: 32.1 % — ABNORMAL LOW (ref 35.0–45.0)
Hemoglobin: 10.5 g/dL — ABNORMAL LOW (ref 11.7–15.5)
Lymphs Abs: 1472 cells/uL (ref 850–3900)
MCH: 25.7 pg — ABNORMAL LOW (ref 27.0–33.0)
MCHC: 32.7 g/dL (ref 32.0–36.0)
MCV: 78.5 fL — ABNORMAL LOW (ref 80.0–100.0)
MPV: 10 fL (ref 7.5–12.5)
Monocytes Relative: 8.8 %
Neutro Abs: 1710 cells/uL (ref 1500–7800)
Neutrophils Relative %: 47.5 %
Platelets: 254 10*3/uL (ref 140–400)
RBC: 4.09 10*6/uL (ref 3.80–5.10)
RDW: 12.9 % (ref 11.0–15.0)
Total Lymphocyte: 40.9 %
WBC: 3.6 10*3/uL — ABNORMAL LOW (ref 3.8–10.8)

## 2019-11-26 LAB — URINALYSIS, ROUTINE W REFLEX MICROSCOPIC
Bilirubin Urine: NEGATIVE
Glucose, UA: NEGATIVE
Hgb urine dipstick: NEGATIVE
Ketones, ur: NEGATIVE
Leukocytes,Ua: NEGATIVE
Nitrite: NEGATIVE
Protein, ur: NEGATIVE
Specific Gravity, Urine: 1.005 (ref 1.001–1.03)
pH: 7 (ref 5.0–8.0)

## 2019-11-26 LAB — ANTI-NUCLEAR AB-TITER (ANA TITER): ANA Titer 1: 1:320 {titer} — ABNORMAL HIGH

## 2019-11-26 LAB — SEDIMENTATION RATE: Sed Rate: 19 mm/h (ref 0–20)

## 2019-11-26 LAB — C3 AND C4
C3 Complement: 136 mg/dL (ref 83–193)
C4 Complement: 20 mg/dL (ref 15–57)

## 2019-11-26 LAB — ANTI-DNA ANTIBODY, DOUBLE-STRANDED: ds DNA Ab: <1 IU/mL

## 2019-11-26 LAB — ANA: Anti Nuclear Antibody (ANA): POSITIVE — AB

## 2019-11-26 LAB — SJOGRENS SYNDROME-A EXTRACTABLE NUCLEAR ANTIBODY: SSA (Ro) (ENA) Antibody, IgG: >8 AI — AB

## 2019-11-26 NOTE — Progress Notes (Signed)
I called the patient to discuss lab results. ANA and Ro antibody remain positive.  ESR, UA, and complements WNL.  Glucose elevated-she was not fasting. Rest of CMP WNL.  Hgb and Hct have dropped.  She has an upcoming appointment with her PCP next week and plans on further discussing the anemia. According to the patient she had a heavy menstrual cycle last week and has eliminated red meat from her diet, which may be contributing to the drop in hemoglobin.     Please forward lab results to PCP.

## 2019-11-30 ENCOUNTER — Ambulatory Visit: Payer: BC Managed Care – PPO | Admitting: Family Medicine

## 2019-12-02 ENCOUNTER — Encounter: Payer: Self-pay | Admitting: Family Medicine

## 2019-12-02 ENCOUNTER — Other Ambulatory Visit: Payer: Self-pay | Admitting: Family Medicine

## 2019-12-02 ENCOUNTER — Ambulatory Visit (INDEPENDENT_AMBULATORY_CARE_PROVIDER_SITE_OTHER): Payer: BC Managed Care – PPO | Admitting: Family Medicine

## 2019-12-02 ENCOUNTER — Other Ambulatory Visit: Payer: Self-pay

## 2019-12-02 VITALS — BP 120/80 | HR 71 | Temp 98.2°F | Ht 59.0 in | Wt 162.0 lb

## 2019-12-02 DIAGNOSIS — I10 Essential (primary) hypertension: Secondary | ICD-10-CM | POA: Diagnosis not present

## 2019-12-02 DIAGNOSIS — D508 Other iron deficiency anemias: Secondary | ICD-10-CM

## 2019-12-02 DIAGNOSIS — R7303 Prediabetes: Secondary | ICD-10-CM | POA: Diagnosis not present

## 2019-12-02 DIAGNOSIS — Z23 Encounter for immunization: Secondary | ICD-10-CM

## 2019-12-02 DIAGNOSIS — Z1159 Encounter for screening for other viral diseases: Secondary | ICD-10-CM

## 2019-12-02 DIAGNOSIS — Z0000 Encounter for general adult medical examination without abnormal findings: Secondary | ICD-10-CM

## 2019-12-02 NOTE — Progress Notes (Signed)
Patient: Joanne Jackson MRN: [id number] DOB: 07-Jan-1976 PCP: Orma Flaming, MD     Subjective:  Chief Complaint  Patient presents with  . Hypertension    pt brought in BP long. Would like to discuss recent Anemia diagnosis  . Annual Exam  . Prediabetes  . Ear Pain    HPI: The patient is a 44 y.o. female who presents today for annual exam. She denies any changes to past medical history. There have been no recent hospitalizations. They are following a well balanced diet and exercise plan. Weight has been increasing steadily. Fu on blood pressure, prediabetes and som ear complaints. She just had labs done at her rheumatologist and she was found to be anemic. She just had colonoscopy and her periods are normal. Denies any other bleeding/easy bruising/night sweats or fevers.   No family history of breast or colon cancer.   Hypertension: Here for follow up of hypertension.  Currently on lisinopril 40mg /day . Home readings range from 761-607 PXTGGYIR/48-54 diastolic. Takes medication as prescribed and denies any side effects. Exercise includes walking. Weight has been stable. Denies any chest pain, headaches, shortness of breath, vision changes, swelling in lower extremities.   Prediabetes Her last a1c was 5.9. she thinks it is likely higher. She didn't do much in the summer because she thinks it too hot outside. She is walking more since it's cooler and states she mainly walks about 30 minutes. She is eating more rice than before. She knows this is not good.   Needs flu shot. Will give today.   Immunization History  Administered Date(s) Administered  . Influenza,inj,Quad PF,6+ Mos 10/24/2018, 12/02/2019   Colonoscopy: 09/2018. Tubular adenoma. Repeat in 7 years.  Mammogram: seeing gyn next week  Pap smear: 11/12/2018   Review of Systems  Constitutional: Negative for chills, fatigue and fever.  HENT: Positive for ear pain. Negative for dental problem, hearing loss and trouble swallowing.    Eyes: Negative for visual disturbance.  Respiratory: Negative for cough, chest tightness and shortness of breath.   Cardiovascular: Negative for chest pain, palpitations and leg swelling.  Gastrointestinal: Negative for abdominal pain, blood in stool, diarrhea and nausea.  Endocrine: Negative for cold intolerance, polydipsia, polyphagia and polyuria.  Genitourinary: Negative for dysuria and hematuria.  Musculoskeletal: Negative for arthralgias.  Skin: Negative for rash.  Neurological: Negative for dizziness and headaches.  Psychiatric/Behavioral: Negative for dysphoric mood and sleep disturbance. The patient is not nervous/anxious.     Allergies Patient has No Known Allergies.  Past Medical History Patient  has a past medical history of Hypertension and Pre-diabetes.  Surgical History Patient  has a past surgical history that includes Cesarean section and Dilation and curettage, diagnostic / therapeutic.  Family History Pateint's family history includes Healthy in her brother and daughter; Heart attack in her father; Hypertension in her father; Thyroid disease in her mother.  Social History Patient  reports that she has never smoked. She has never used smokeless tobacco. She reports previous alcohol use. She reports that she does not use drugs.    Objective: Vitals:   12/02/19 0911 12/02/19 0927  BP: (!) 130/96 120/80  Pulse: 71   Temp: 98.2 F (36.8 C)   SpO2: 99%   Weight: 162 lb (73.5 kg)   Height: 4\' 11"  (1.499 m)     Body mass index is 32.72 kg/m.  Physical Exam Vitals reviewed.  Constitutional:      Appearance: Normal appearance. She is well-developed. She is obese.  HENT:  Head: Normocephalic and atraumatic.     Right Ear: Tympanic membrane, ear canal and external ear normal.     Left Ear: Tympanic membrane, ear canal and external ear normal.     Ears:     Comments: Right TM is pearly with light reflex. Nice and pearly but does have slight bulge.      Nose: Nose normal.     Mouth/Throat:     Mouth: Mucous membranes are moist.  Eyes:     Extraocular Movements: Extraocular movements intact.     Conjunctiva/sclera: Conjunctivae normal.     Pupils: Pupils are equal, round, and reactive to light.  Neck:     Thyroid: No thyromegaly.     Vascular: No carotid bruit.  Cardiovascular:     Rate and Rhythm: Normal rate and regular rhythm.     Pulses: Normal pulses.     Heart sounds: Normal heart sounds. No murmur heard.   Pulmonary:     Effort: Pulmonary effort is normal.     Breath sounds: Normal breath sounds.  Abdominal:     General: Bowel sounds are normal. There is no distension.     Palpations: Abdomen is soft.     Tenderness: There is no abdominal tenderness.  Musculoskeletal:     Cervical back: Normal range of motion and neck supple.  Lymphadenopathy:     Cervical: No cervical adenopathy.  Skin:    General: Skin is warm and dry.     Capillary Refill: Capillary refill takes less than 2 seconds.     Findings: No rash.  Neurological:     General: No focal deficit present.     Mental Status: She is alert and oriented to person, place, and time.     Cranial Nerves: No cranial nerve deficit.     Coordination: Coordination normal.     Deep Tendon Reflexes: Reflexes normal.  Psychiatric:        Mood and Affect: Mood normal.        Behavior: Behavior normal.          Office Visit from 12/02/2019 in [facility]  PHQ-2 Total Score 0      Assessment/plan: 1. Annual physical exam Routine fasting labs today. Hm reviewed and UTD. Needs mmg and getting with gyn next week. Flu shot today. Really encouraged more exercise and discussed getting heart rate up 30 minutes at least 5x/week. Encouraged weight loss. F/u in one year or as needed.  Patient counseling [x]    Nutrition: Stressed importance of moderation in sodium/caffeine intake, saturated fat and cholesterol, caloric balance, sufficient intake of fresh  fruits, vegetables, fiber, calcium, iron, and 1 mg of folate supplement per day (for females capable of pregnancy).  [x]    Stressed the importance of regular exercise.   []    Substance Abuse: Discussed cessation/primary prevention of tobacco, alcohol, or other drug use; driving or other dangerous activities under the influence; availability of treatment for abuse.   [x]    Injury prevention: Discussed safety belts, safety helmets, smoke detector, smoking near bedding or upholstery.   [x]    Sexuality: Discussed sexually transmitted diseases, partner selection, use of condoms, avoidance of unintended pregnancy  and contraceptive alternatives.  [x]    Dental health: Discussed importance of regular tooth brushing, flossing, and dental visits.  [x]    Health maintenance and immunizations reviewed. Please refer to Health maintenance section.    - TSH; Future  2. Essential hypertension Blood pressure is to goal. Continue current anti-hypertensive medication: lisinopril  40mg /day. Refills not given and routine lab work will be done today. Recommended routine exercise and healthy diet including DASH diet and mediterranean diet. Encouraged weight loss. F/u in 12 months.  ' - Microalbumin / creatinine urine ratio; Future - Lipid panel; Future  3. Prediabetes F/u in 6-12 months depending on a1c.  - Hemoglobin A1c; Future  4. Other iron deficiency anemia New finding. utd on cscope. Periods normal. No other red flags. Diet may be biggest factor. Checking iron panel and will f/u with repeat if we start oral iron.  - CBC with Differential/Platelet; Future - Iron, TIBC and Ferritin Panel; Future  5. Encounter for hepatitis C screening test for low risk patient  - Hepatitis C antibody; Future  6. Need for immunization against influenza  - Flu Vaccine QUAD 36+ mos IM  Watch ear pain. No evidence of infection, but make sure using nasal spray daily.    This visit occurred during the SARS-CoV-2 public  health emergency.  Safety protocols were in place, including screening questions prior to the visit, additional usage of staff PPE, and extensive cleaning of exam room while observing appropriate contact time as indicated for disinfecting solutions.     Return in about 1 year (around 12/01/2020) for annual/htn .     Orma Flaming, MD [place]  12/02/2019

## 2019-12-02 NOTE — Patient Instructions (Signed)
Flu shot today  Need mammogram, please set up with gyn.   Blood pressure is great, keep up the good work! Really work on weight loss.   Ear looks okay, make sure using nasal spray daily and let me know if starts to hurt worse.   Preventive Care 75-43 Years Old, Female Preventive care refers to visits with your health care provider and lifestyle choices that can promote health and wellness. This includes:  A yearly physical exam. This may also be called an annual well check.  Regular dental visits and eye exams.  Immunizations.  Screening for certain conditions.  Healthy lifestyle choices, such as eating a healthy diet, getting regular exercise, not using drugs or products that contain nicotine and tobacco, and limiting alcohol use. What can I expect for my preventive care visit? Physical exam Your health care provider will check your:  Height and weight. This may be used to calculate body mass index (BMI), which tells if you are at a healthy weight.  Heart rate and blood pressure.  Skin for abnormal spots. Counseling Your health care provider may ask you questions about your:  Alcohol, tobacco, and drug use.  Emotional well-being.  Home and relationship well-being.  Sexual activity.  Eating habits.  Work and work Statistician.  Method of birth control.  Menstrual cycle.  Pregnancy history. What immunizations do I need?  Influenza (flu) vaccine  This is recommended every year. Tetanus, diphtheria, and pertussis (Tdap) vaccine  You may need a Td booster every 10 years. Varicella (chickenpox) vaccine  You may need this if you have not been vaccinated. Zoster (shingles) vaccine  You may need this after age 78. Measles, mumps, and rubella (MMR) vaccine  You may need at least one dose of MMR if you were born in 1957 or later. You may also need a second dose. Pneumococcal conjugate (PCV13) vaccine  You may need this if you have certain conditions and were  not previously vaccinated. Pneumococcal polysaccharide (PPSV23) vaccine  You may need one or two doses if you smoke cigarettes or if you have certain conditions. Meningococcal conjugate (MenACWY) vaccine  You may need this if you have certain conditions. Hepatitis A vaccine  You may need this if you have certain conditions or if you travel or work in places where you may be exposed to hepatitis A. Hepatitis B vaccine  You may need this if you have certain conditions or if you travel or work in places where you may be exposed to hepatitis B. Haemophilus influenzae type b (Hib) vaccine  You may need this if you have certain conditions. Human papillomavirus (HPV) vaccine  If recommended by your health care provider, you may need three doses over 6 months. You may receive vaccines as individual doses or as more than one vaccine together in one shot (combination vaccines). Talk with your health care provider about the risks and benefits of combination vaccines. What tests do I need? Blood tests  Lipid and cholesterol levels. These may be checked every 5 years, or more frequently if you are over 80 years old.  Hepatitis C test.  Hepatitis B test. Screening  Lung cancer screening. You may have this screening every year starting at age 57 if you have a 30-pack-year history of smoking and currently smoke or have quit within the past 15 years.  Colorectal cancer screening. All adults should have this screening starting at age 3 and continuing until age 58. Your health care provider may recommend screening at age  45 if you are at increased risk. You will have tests every 1-10 years, depending on your results and the type of screening test.  Diabetes screening. This is done by checking your blood sugar (glucose) after you have not eaten for a while (fasting). You may have this done every 1-3 years.  Mammogram. This may be done every 1-2 years. Talk with your health care provider about when  you should start having regular mammograms. This may depend on whether you have a family history of breast cancer.  BRCA-related cancer screening. This may be done if you have a family history of breast, ovarian, tubal, or peritoneal cancers.  Pelvic exam and Pap test. This may be done every 3 years starting at age 65. Starting at age 33, this may be done every 5 years if you have a Pap test in combination with an HPV test. Other tests  Sexually transmitted disease (STD) testing.  Bone density scan. This is done to screen for osteoporosis. You may have this scan if you are at high risk for osteoporosis. Follow these instructions at home: Eating and drinking  Eat a diet that includes fresh fruits and vegetables, whole grains, lean protein, and low-fat dairy.  Take vitamin and mineral supplements as recommended by your health care provider.  Do not drink alcohol if: ? Your health care provider tells you not to drink. ? You are pregnant, may be pregnant, or are planning to become pregnant.  If you drink alcohol: ? Limit how much you have to 0-1 drink a day. ? Be aware of how much alcohol is in your drink. In the [place] one drink equals one 12 oz bottle of beer (355 mL), one 5 oz glass of wine (148 mL), or one 1 oz glass of hard liquor (44 mL). Lifestyle  Take daily care of your teeth and gums.  Stay active. Exercise for at least 30 minutes on 5 or more days each week.  Do not use any products that contain nicotine or tobacco, such as cigarettes, e-cigarettes, and chewing tobacco. If you need help quitting, ask your health care provider.  If you are sexually active, practice safe sex. Use a condom or other form of birth control (contraception) in order to prevent pregnancy and STIs (sexually transmitted infections).  If told by your health care provider, take low-dose aspirin daily starting at age 41. What's next?  Visit your health care provider once a year for a well check  visit.  Ask your health care provider how often you should have your eyes and teeth checked.  Stay up to date on all vaccines. This information is not intended to replace advice given to you by your health care provider. Make sure you discuss any questions you have with your health care provider. Document Revised: 10/03/2017 Document Reviewed: 10/03/2017 Elsevier Patient Education  2020 [employer].

## 2019-12-03 ENCOUNTER — Other Ambulatory Visit: Payer: Self-pay | Admitting: Family Medicine

## 2019-12-03 ENCOUNTER — Encounter: Payer: Self-pay | Admitting: Family Medicine

## 2019-12-03 DIAGNOSIS — D509 Iron deficiency anemia, unspecified: Secondary | ICD-10-CM | POA: Insufficient documentation

## 2019-12-03 LAB — CBC WITH DIFFERENTIAL/PLATELET
Absolute Monocytes: 361 cells/uL (ref 200–950)
Basophils Absolute: 22 cells/uL (ref 0–200)
Basophils Relative: 0.5 %
Eosinophils Absolute: 70 cells/uL (ref 15–500)
Eosinophils Relative: 1.6 %
HCT: 35.9 % (ref 35.0–45.0)
Hemoglobin: 11.3 g/dL — ABNORMAL LOW (ref 11.7–15.5)
Lymphs Abs: 1474 cells/uL (ref 850–3900)
MCH: 24.8 pg — ABNORMAL LOW (ref 27.0–33.0)
MCHC: 31.5 g/dL — ABNORMAL LOW (ref 32.0–36.0)
MCV: 78.7 fL — ABNORMAL LOW (ref 80.0–100.0)
MPV: 9.5 fL (ref 7.5–12.5)
Monocytes Relative: 8.2 %
Neutro Abs: 2473 cells/uL (ref 1500–7800)
Neutrophils Relative %: 56.2 %
Platelets: 280 10*3/uL (ref 140–400)
RBC: 4.56 10*6/uL (ref 3.80–5.10)
RDW: 13 % (ref 11.0–15.0)
Total Lymphocyte: 33.5 %
WBC: 4.4 10*3/uL (ref 3.8–10.8)

## 2019-12-03 LAB — LIPID PANEL
Cholesterol: 158 mg/dL (ref <200)
HDL: 50 mg/dL (ref >=50)
LDL Cholesterol (Calc): 85 mg/dL (calc)
Non-HDL Cholesterol (Calc): 108 mg/dL (calc) (ref <130)
Total CHOL/HDL Ratio: 3.2 (calc) (ref <5.0)
Triglycerides: 131 mg/dL (ref <150)

## 2019-12-03 LAB — IRON,TIBC AND FERRITIN PANEL
%SAT: 8 % (calc) — ABNORMAL LOW (ref 16–45)
Ferritin: 14 ng/mL — ABNORMAL LOW (ref 16–232)
Iron: 35 ug/dL — ABNORMAL LOW (ref 40–190)
TIBC: 448 mcg/dL (calc) (ref 250–450)

## 2019-12-03 LAB — HEMOGLOBIN A1C
Hgb A1c MFr Bld: 6.3 % of total Hgb — ABNORMAL HIGH (ref <5.7)
Mean Plasma Glucose: 134 (calc)
eAG (mmol/L): 7.4 (calc)

## 2019-12-03 LAB — MICROALBUMIN / CREATININE URINE RATIO
Creatinine, Urine: 16 mg/dL — ABNORMAL LOW (ref 20–275)
Microalb Creat Ratio: 38 mcg/mg creat — ABNORMAL HIGH (ref <30)
Microalb, Ur: 0.6 mg/dL

## 2019-12-03 LAB — HEPATITIS C ANTIBODY
Hepatitis C Ab: NONREACTIVE
SIGNAL TO CUT-OFF: 0.02 (ref <1.00)

## 2019-12-03 LAB — TSH: TSH: 1.22 mIU/L

## 2019-12-03 MED ORDER — FERROUS SULFATE 324 (65 FE) MG PO TBEC: 324.0000 mg | DELAYED_RELEASE_TABLET | Freq: Every day | ORAL | 0 refills | Status: DC

## 2019-12-04 ENCOUNTER — Encounter: Payer: Self-pay | Admitting: Family Medicine

## 2019-12-07 ENCOUNTER — Other Ambulatory Visit: Payer: Self-pay | Admitting: Family Medicine

## 2019-12-09 ENCOUNTER — Encounter: Payer: Self-pay | Admitting: Obstetrics & Gynecology

## 2019-12-09 ENCOUNTER — Ambulatory Visit (INDEPENDENT_AMBULATORY_CARE_PROVIDER_SITE_OTHER): Payer: BC Managed Care – PPO | Admitting: Obstetrics & Gynecology

## 2019-12-09 ENCOUNTER — Other Ambulatory Visit: Payer: Self-pay

## 2019-12-09 VITALS — BP 118/72 | Ht 60.5 in | Wt 159.4 lb

## 2019-12-09 DIAGNOSIS — N841 Polyp of cervix uteri: Secondary | ICD-10-CM | POA: Diagnosis not present

## 2019-12-09 DIAGNOSIS — Z789 Other specified health status: Secondary | ICD-10-CM | POA: Diagnosis not present

## 2019-12-09 DIAGNOSIS — Z01419 Encounter for gynecological examination (general) (routine) without abnormal findings: Secondary | ICD-10-CM

## 2019-12-09 DIAGNOSIS — N921 Excessive and frequent menstruation with irregular cycle: Secondary | ICD-10-CM

## 2019-12-09 DIAGNOSIS — Z6830 Body mass index (BMI) 30.0-30.9, adult: Secondary | ICD-10-CM

## 2019-12-09 DIAGNOSIS — E6609 Other obesity due to excess calories: Secondary | ICD-10-CM | POA: Diagnosis not present

## 2019-12-09 NOTE — Progress Notes (Signed)
Joanne Jackson 02/10/1975 [id number]   History:    44 y.o.  G1P1L1 Married.  Daughter 31 yo (IVF)  RP:  Established patient presenting for annual gyn exam   HPI:  Menses normal every month.  Premenstrual spotting x a few months.  H/O Dysmenorrhea, much better since she had her baby.  No pelvic pain.  No pain with IC.  Using condoms and h/o female infertility.  Urine/BMs normal.  Breasts normal.  BMI 30.62.  Exercising regularly.  Health labs with Fam MD.  Past medical history,surgical history, family history and social history were all reviewed and documented in the EPIC chart.  Gynecologic History Patient's last menstrual period was 11/16/2019.  Obstetric History OB History  Gravida Para Term Preterm AB Living  1 1       1   SAB TAB Ectopic Multiple Live Births               # Outcome Date GA Lbr Len/2nd Weight Sex Delivery Anes PTL Lv  1 Para              ROS: A ROS was performed and pertinent positives and negatives are included in the history.  GENERAL: No fevers or chills. HEENT: No change in vision, no earache, sore throat or sinus congestion. NECK: No pain or stiffness. CARDIOVASCULAR: No chest pain or pressure. No palpitations. PULMONARY: No shortness of breath, cough or wheeze. GASTROINTESTINAL: No abdominal pain, nausea, vomiting or diarrhea, melena or bright red blood per rectum. GENITOURINARY: No urinary frequency, urgency, hesitancy or dysuria. MUSCULOSKELETAL: No joint or muscle pain, no back pain, no recent trauma. DERMATOLOGIC: No rash, no itching, no lesions. ENDOCRINE: No polyuria, polydipsia, no heat or cold intolerance. No recent change in weight. HEMATOLOGICAL: No anemia or easy bruising or bleeding. NEUROLOGIC: No headache, seizures, numbness, tingling or weakness. PSYCHIATRIC: No depression, no loss of interest in normal activity or change in sleep pattern.     Exam:   BP 118/72   Ht 5' 0.5" (1.537 m)   Wt 159 lb 6.4 oz (72.3 kg)   LMP 11/16/2019 Comment: No  birth control   BMI 30.62 kg/m   Body mass index is 30.62 kg/m.  General appearance : Well developed well nourished female. No acute distress HEENT: Eyes: no retinal hemorrhage or exudates,  Neck supple, trachea midline, no carotid bruits, no thyroidmegaly Lungs: Clear to auscultation, no rhonchi or wheezes, or rib retractions  Heart: Regular rate and rhythm, no murmurs or gallops Breast:Examined in sitting and supine position were symmetrical in appearance, no palpable masses or tenderness,  no skin retraction, no nipple inversion, no nipple discharge, no skin discoloration, no axillary or supraclavicular lymphadenopathy Abdomen: no palpable masses or tenderness, no rebound or guarding Extremities: no edema or skin discoloration or tenderness  Pelvic: Vulva: Normal             Vagina: No gross lesions or discharge  Cervix: No discharge.  Endocervical Polyp.  Pap reflex done.  Easy removal of Polyp with rotation using a narrow clamp.  Silver nitrate applied at the base of the Polyp.  Specimen to patho.  Uterus  AV, normal size, shape and consistency, non-tender and mobile  Adnexa  Without masses or tenderness  Anus: Normal   Assessment/Plan:  44 y.o. female for annual exam   1. Encounter for routine gynecological examination with Papanicolaou smear of cervix Gynecologic exam normal except for an endocervical polyp visible at the external os.  Pap reflex done.  Breast exam normal.  Screening mammogram October 2020 was negative, will schedule repeat mammogram now.  Health labs with family physician.  2. Use of condoms for contraception As well as history of female infertility.  3. Metrorrhagia Metrorrhagia probably associated with endocervical polyp.  Endocervical polyp easily removed and specimen sent to pathology.  Precautions reviewed with patient.  Will call for evaluation with a pelvic ultrasound if continued metrorrhagia.  4. Endocervical polyp Easy removal of endocervical polyp,  pathology pending.  5. Class 1 obesity due to excess calories without serious comorbidity with body mass index (BMI) of 30.0 to 30.9 in adult Recommend a lower calorie/carb diet.  Continue with fitness.  Princess Bruins MD, 2:54 PM 12/09/2019

## 2019-12-10 ENCOUNTER — Encounter: Payer: Self-pay | Admitting: Obstetrics & Gynecology

## 2019-12-10 DIAGNOSIS — Z01419 Encounter for gynecological examination (general) (routine) without abnormal findings: Secondary | ICD-10-CM | POA: Diagnosis not present

## 2019-12-10 DIAGNOSIS — N841 Polyp of cervix uteri: Secondary | ICD-10-CM | POA: Diagnosis not present

## 2019-12-14 ENCOUNTER — Other Ambulatory Visit: Payer: Self-pay | Admitting: Obstetrics & Gynecology

## 2019-12-14 DIAGNOSIS — Z1231 Encounter for screening mammogram for malignant neoplasm of breast: Secondary | ICD-10-CM

## 2019-12-14 LAB — PAP IG W/ RFLX HPV ASCU

## 2019-12-14 LAB — PATHOLOGY REPORT

## 2019-12-14 LAB — TISSUE SPECIMEN

## 2019-12-16 DIAGNOSIS — Z1231 Encounter for screening mammogram for malignant neoplasm of breast: Secondary | ICD-10-CM

## 2019-12-18 ENCOUNTER — Ambulatory Visit
Admission: RE | Admit: 2019-12-18 | Discharge: 2019-12-18 | Disposition: A | Discharge status: Home / Self Care | Payer: BC Managed Care – PPO | Source: Ambulatory Visit

## 2019-12-18 ENCOUNTER — Other Ambulatory Visit: Payer: Self-pay

## 2019-12-18 DIAGNOSIS — Z1231 Encounter for screening mammogram for malignant neoplasm of breast: Secondary | ICD-10-CM

## 2019-12-18 IMAGING — MG DIGITAL SCREENING BILAT W/ TOMO W/ CAD
8 series · 9 of 24 positions shown · non-contrast
Comparison: Previous exam(s).

CLINICAL DATA: Screening.

EXAM:
DIGITAL SCREENING BILATERAL MAMMOGRAM WITH TOMO AND CAD

[R CC synth-2D]
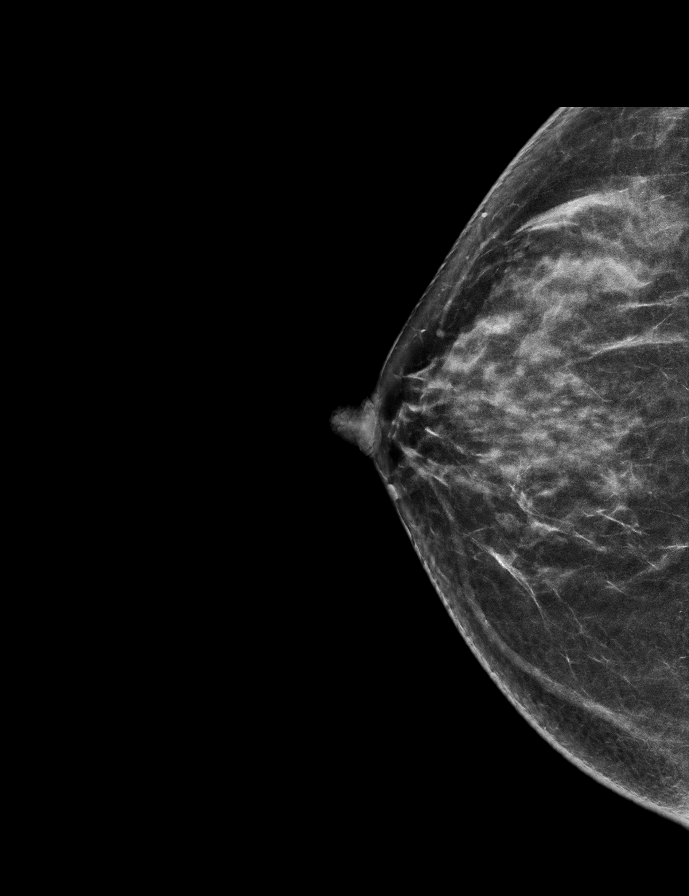

[L MLO synth-2D]
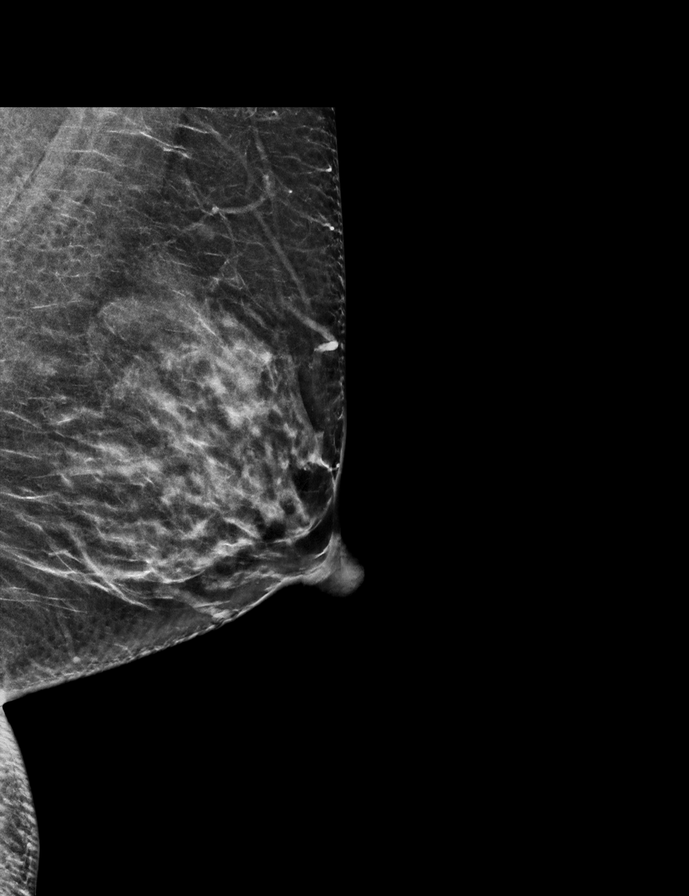

[R MLO synth-2D]
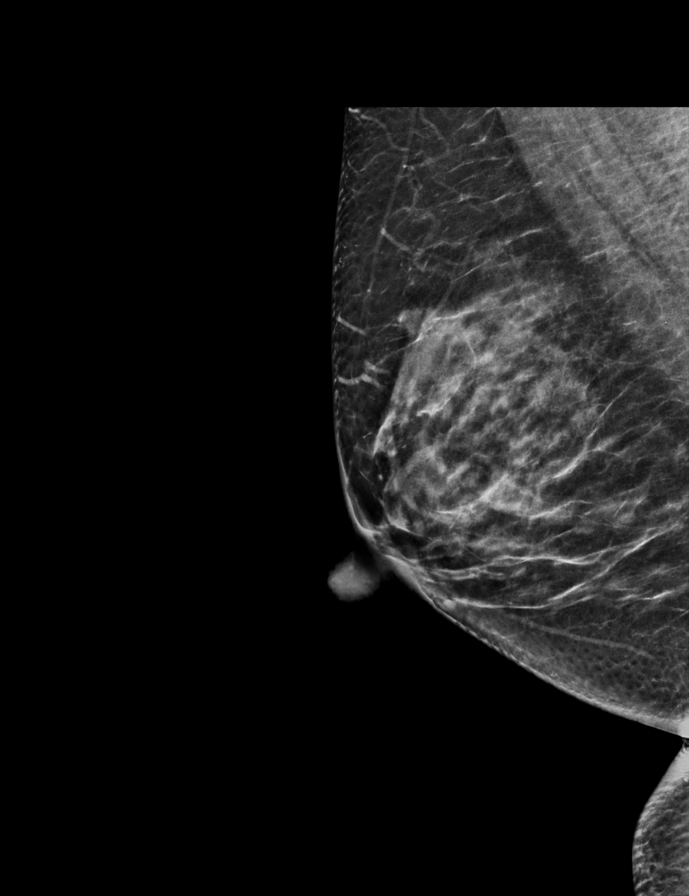

[L CC synth-2D]
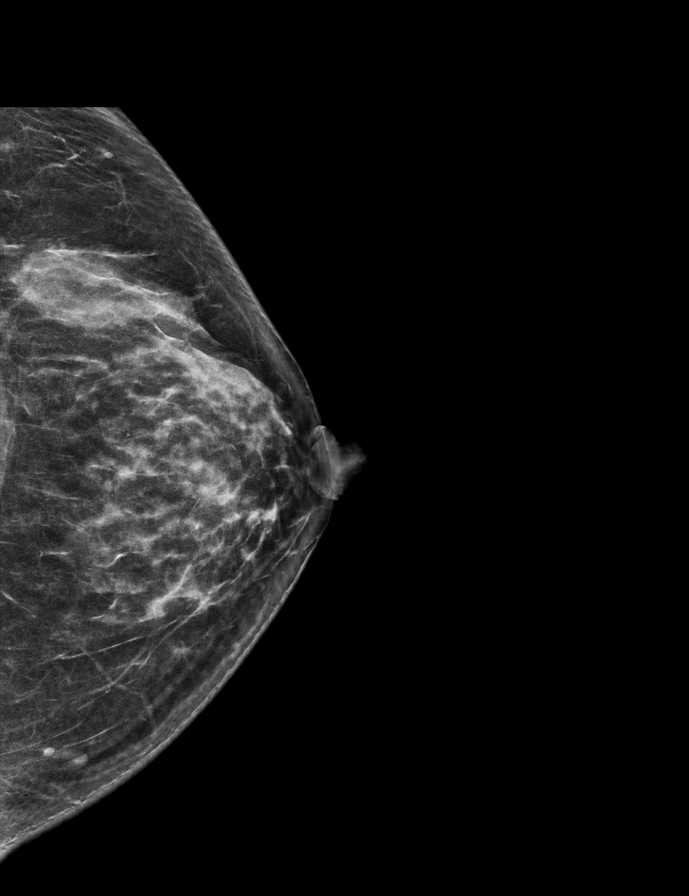

[R CC tomo · 2 of 62 frames shown]
[frame 21/62]
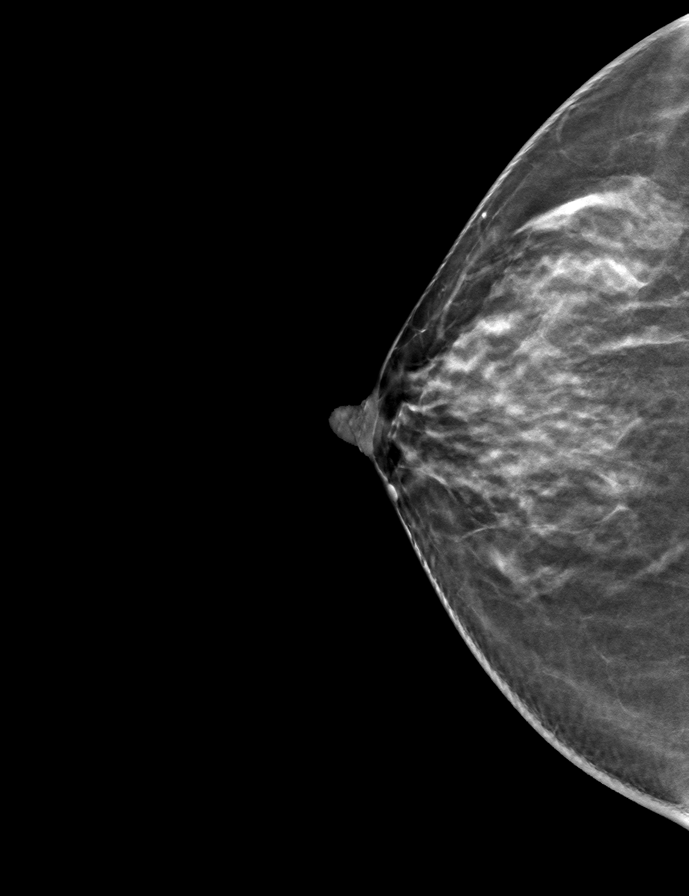
[frame 31/62]
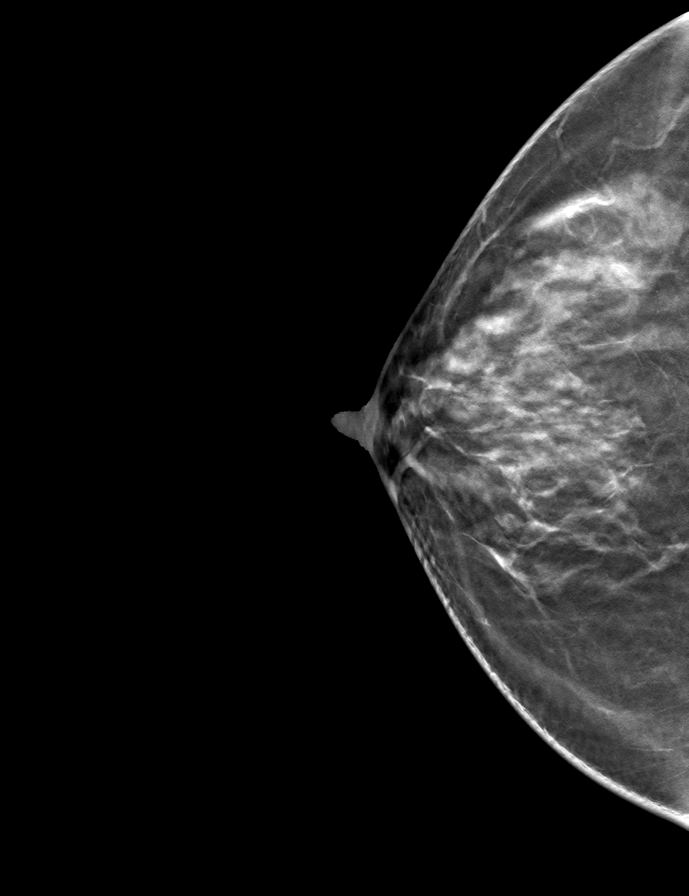

[L CC tomo · tomo slice 31/62.0]
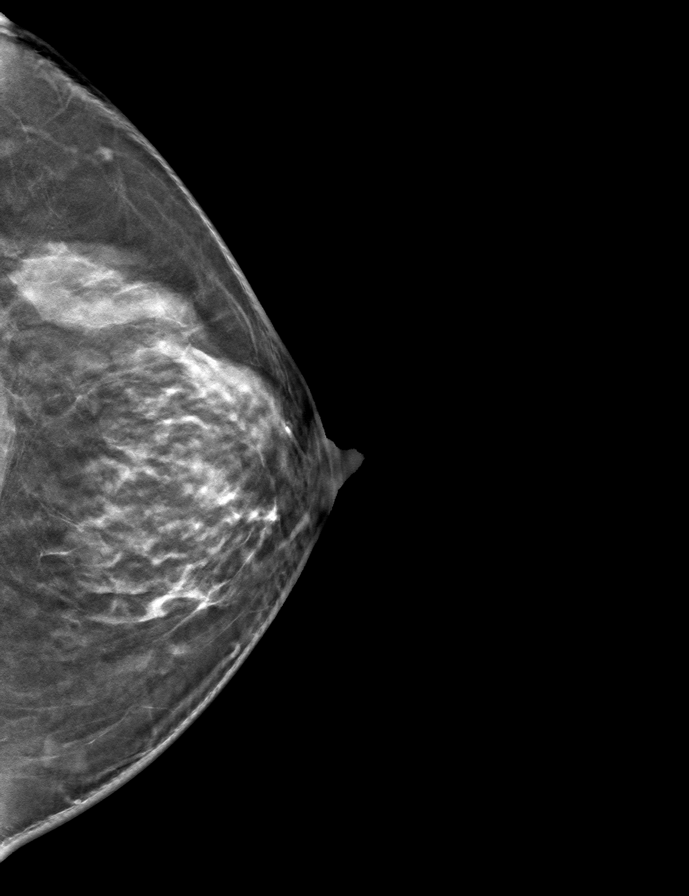

[R MLO tomo · tomo slice 30/59.0]
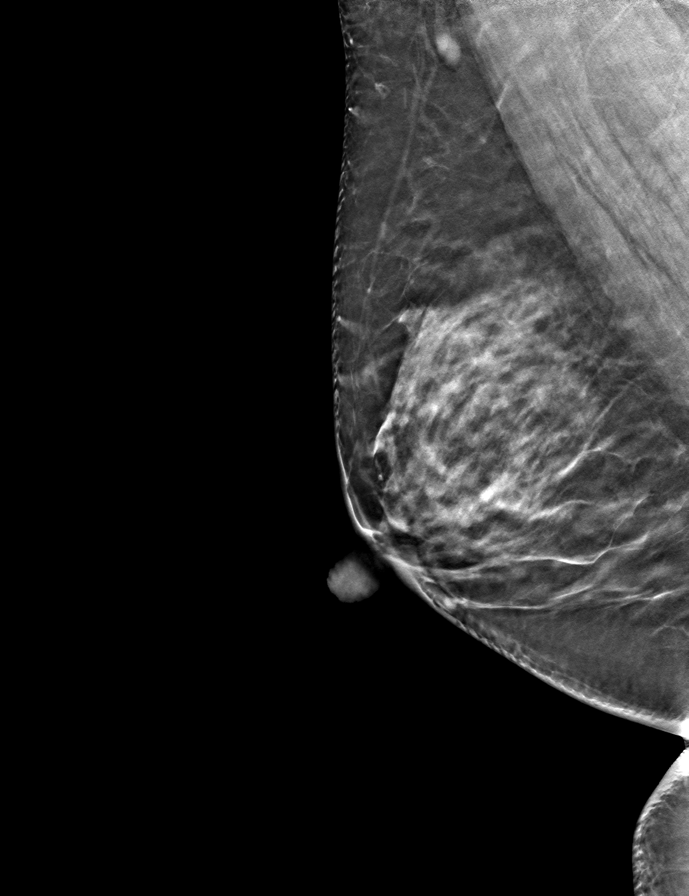

[L MLO tomo · tomo slice 31/62.0]
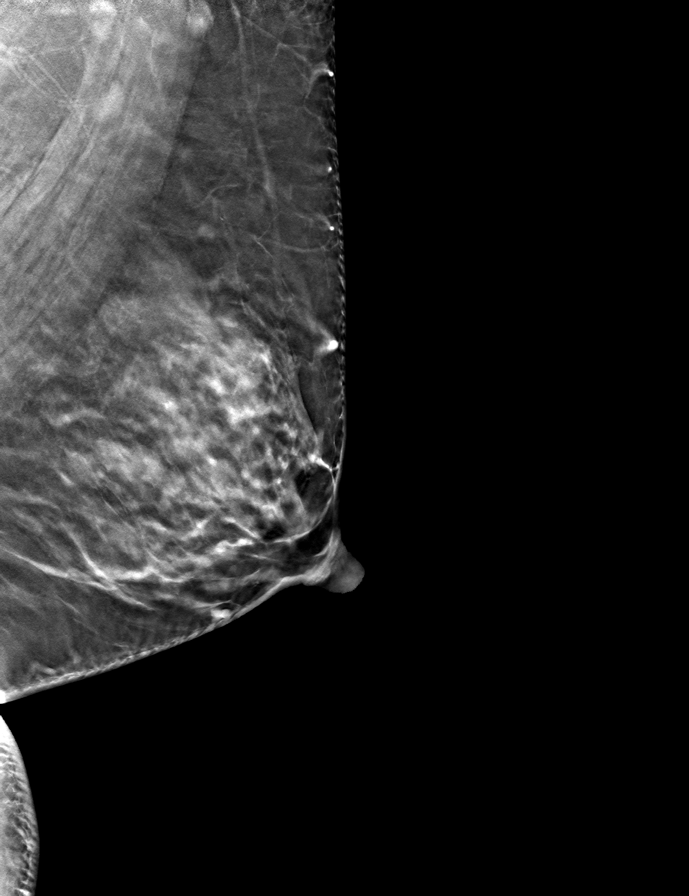

[9 of 24 positions shown; findings below may reference images not displayed]

ACR Breast Density Category c: The breast tissue is heterogeneously
dense, which may obscure small masses.
FINDINGS: There are no findings suspicious for malignancy. Images were
processed with CAD.
IMPRESSION: No mammographic evidence of malignancy. A result letter of this
screening mammogram will be mailed directly to the patient.

RECOMMENDATION:
Screening mammogram in one year. (Code:[5V])

BI-RADS CATEGORY  1: Negative.

## 2020-01-05 ENCOUNTER — Telehealth: Payer: Self-pay | Admitting: Gastroenterology

## 2020-01-05 NOTE — Telephone Encounter (Signed)
Spoke with the patient and she stated she did not need to have the omeprazole right now, she will call back if her GERD starts to act up. She takes it as needed and expressed to her that it is available over the counter. She said she has atleast 3 months left.

## 2020-01-05 NOTE — Telephone Encounter (Signed)
Pt is requesting a call back to discuss her omeprazole medication, pt would like to know if she should continue taking this medication or if she should switch to an over the counter medicine.

## 2020-02-16 ENCOUNTER — Encounter: Payer: Self-pay | Admitting: Family Medicine

## 2020-03-02 ENCOUNTER — Ambulatory Visit: Payer: 59 | Admitting: Family Medicine

## 2020-03-03 ENCOUNTER — Ambulatory Visit (INDEPENDENT_AMBULATORY_CARE_PROVIDER_SITE_OTHER): Payer: 59 | Admitting: Family Medicine

## 2020-03-03 ENCOUNTER — Encounter: Payer: Self-pay | Admitting: Family Medicine

## 2020-03-03 ENCOUNTER — Other Ambulatory Visit: Payer: Self-pay

## 2020-03-03 VITALS — BP 128/88 | HR 62 | Temp 98.0°F | Ht 60.5 in | Wt 157.4 lb

## 2020-03-03 DIAGNOSIS — D508 Other iron deficiency anemias: Secondary | ICD-10-CM | POA: Diagnosis not present

## 2020-03-03 DIAGNOSIS — R7303 Prediabetes: Secondary | ICD-10-CM

## 2020-03-03 LAB — CBC WITH DIFFERENTIAL/PLATELET
Basophils Absolute: 0 10*3/uL (ref 0.0–0.1)
Basophils Relative: 0.5 % (ref 0.0–3.0)
Eosinophils Absolute: 0.1 10*3/uL (ref 0.0–0.7)
Eosinophils Relative: 2.1 % (ref 0.0–5.0)
HCT: 40 % (ref 36.0–46.0)
Hemoglobin: 13.3 g/dL (ref 12.0–15.0)
Lymphocytes Relative: 44.3 % (ref 12.0–46.0)
Lymphs Abs: 1.5 10*3/uL (ref 0.7–4.0)
MCHC: 33.3 g/dL (ref 30.0–36.0)
MCV: 79.2 fl (ref 78.0–100.0)
Monocytes Absolute: 0.2 10*3/uL (ref 0.1–1.0)
Monocytes Relative: 7 % (ref 3.0–12.0)
Neutro Abs: 1.6 10*3/uL (ref 1.4–7.7)
Neutrophils Relative %: 46.1 % (ref 43.0–77.0)
Platelets: 206 10*3/uL (ref 150.0–400.0)
RBC: 5.05 Mil/uL (ref 3.87–5.11)
RDW: 16.4 % — ABNORMAL HIGH (ref 11.5–15.5)
WBC: 3.5 10*3/uL — ABNORMAL LOW (ref 4.0–10.5)

## 2020-03-03 LAB — HEMOGLOBIN A1C: Hgb A1c MFr Bld: 6 % (ref 4.6–6.5)

## 2020-03-03 NOTE — Progress Notes (Signed)
Patient: Joanne Jackson MRN: [id number] DOB: 01/14/1976 PCP: Orma Flaming, MD     Subjective:  Chief Complaint  Patient presents with  . Iron Deficiency   . Prediabetes    HPI: The patient is a 45 y.o. female who presents today for 3 month follow up for her prediabetes. A1c 3 months ago was 6.3 gave her 3 months t really work on diet and exercise before we recheck today. If not better we will start medication. She has lost 5 pounds since I saw her last time. She has reduced starchy foods, rice and flour food. She Is eating whole wheat bread. She is not really exercising.   Iron deficiency anemia Started her on an iron pill. Will recheck today. She has no problems with it and we will recheck today.   Review of Systems  Constitutional: Negative for chills, fatigue and fever.  HENT: Negative for dental problem, ear pain, hearing loss and trouble swallowing.   Eyes: Negative for visual disturbance.  Respiratory: Negative for cough, chest tightness and shortness of breath.   Cardiovascular: Negative for chest pain, palpitations and leg swelling.  Gastrointestinal: Negative for abdominal pain, blood in stool, diarrhea and nausea.  Endocrine: Negative for cold intolerance, polydipsia, polyphagia and polyuria.  Genitourinary: Negative for dysuria and hematuria.  Musculoskeletal: Negative for arthralgias.  Skin: Negative for rash.  Neurological: Negative for dizziness and headaches.  Psychiatric/Behavioral: Negative for dysphoric mood and sleep disturbance. The patient is not nervous/anxious.     Allergies Patient has No Known Allergies.  Past Medical History Patient  has a past medical history of Hypertension and Pre-diabetes.  Surgical History Patient  has a past surgical history that includes Cesarean section and Dilation and curettage, diagnostic / therapeutic.  Family History Pateint's family history includes Healthy in her brother and daughter; Heart attack in her father;  Hypertension in her father; Thyroid disease in her mother.  Social History Patient  reports that she has never smoked. She has never used smokeless tobacco. She reports previous alcohol use. She reports that she does not use drugs.    Objective: Vitals:   03/03/20 1122  BP: 128/88  Pulse: 62  Temp: 98 F (36.7 C)  TempSrc: Temporal  SpO2: 100%  Weight: 157 lb 6.4 oz (71.4 kg)  Height: 5' 0.5" (1.537 m)    Body mass index is 30.23 kg/m.  Physical Exam Vitals reviewed.  Constitutional:      Appearance: Normal appearance. She is obese.  HENT:     Head: Normocephalic and atraumatic.  Cardiovascular:     Rate and Rhythm: Normal rate and regular rhythm.     Heart sounds: Normal heart sounds.  Pulmonary:     Effort: Pulmonary effort is normal.     Breath sounds: Normal breath sounds.  Abdominal:     General: Abdomen is flat. Bowel sounds are normal.     Palpations: Abdomen is soft.  Musculoskeletal:     Cervical back: Normal range of motion and neck supple.  Skin:    Capillary Refill: Capillary refill takes less than 2 seconds.  Neurological:     General: No focal deficit present.     Mental Status: She is alert and oriented to person, place, and time.  Psychiatric:        Mood and Affect: Mood normal.        Behavior: Behavior normal.        Assessment/plan: 1. Prediabetes Recheck today. Has lost 5 pounds, but no exercise. She knows  if a1c not improved we will need to start medication. F/u for annual in October.  - Hemoglobin A1c  2. Other iron deficiency anemia  - CBC with Differential/Platelet - Iron, TIBC and Ferritin Panel    This visit occurred during the SARS-CoV-2 public health emergency.  Safety protocols were in place, including screening questions prior to the visit, additional usage of staff PPE, and extensive cleaning of exam room while observing appropriate contact time as indicated for disinfecting solutions.     Return in about 9 months  (around 12/01/2020) for annual/prediabetes/fasting labs .    Orma Flaming, MD [facility]   03/03/2020

## 2020-03-04 ENCOUNTER — Encounter: Payer: Self-pay | Admitting: Family Medicine

## 2020-03-04 LAB — IRON,TIBC AND FERRITIN PANEL
%SAT: 29 % (calc) (ref 16–45)
Ferritin: 12 ng/mL — ABNORMAL LOW (ref 16–232)
Iron: 115 ug/dL (ref 40–190)
TIBC: 390 mcg/dL (calc) (ref 250–450)

## 2020-03-07 ENCOUNTER — Other Ambulatory Visit: Payer: Self-pay | Admitting: Family Medicine

## 2020-03-10 ENCOUNTER — Other Ambulatory Visit: Payer: Self-pay | Admitting: Family Medicine

## 2020-03-10 MED ORDER — FERROUS SULFATE 324 (65 FE) MG PO TBEC: 1.0000 | DELAYED_RELEASE_TABLET | Freq: Every day | ORAL | 3 refills | Status: DC

## 2020-03-21 ENCOUNTER — Other Ambulatory Visit: Payer: Self-pay | Admitting: Family Medicine

## 2020-04-03 ENCOUNTER — Encounter: Payer: Self-pay | Admitting: Rheumatology

## 2020-04-12 LAB — HM DIABETES EYE EXAM

## 2020-04-19 ENCOUNTER — Encounter: Payer: Self-pay | Admitting: Family Medicine

## 2020-04-28 ENCOUNTER — Other Ambulatory Visit: Payer: Self-pay | Admitting: Family Medicine

## 2020-05-17 NOTE — Progress Notes (Deleted)
Office Visit Note  Patient: Joanne Jackson             Date of Birth: 07/30/1975           MRN: [id number]             PCP: Orma Flaming, MD Referring: Orma Flaming, MD Visit Date: 05/31/2020 Occupation: @GUAROCC @  Subjective:  No chief complaint on file.   History of Present Illness: Joanne Jackson is a 45 y.o. female ***   Activities of Daily Living:  Patient reports morning stiffness for *** {minute/hour:19697}.   Patient {ACTIONS;DENIES/REPORTS:21021675::"Denies"} nocturnal pain.  Difficulty dressing/grooming: {ACTIONS;DENIES/REPORTS:21021675::"Denies"} Difficulty climbing stairs: {ACTIONS;DENIES/REPORTS:21021675::"Denies"} Difficulty getting out of chair: {ACTIONS;DENIES/REPORTS:21021675::"Denies"} Difficulty using hands for taps, buttons, cutlery, and/or writing: {ACTIONS;DENIES/REPORTS:21021675::"Denies"}  No Rheumatology ROS completed.   PMFS History:  Patient Active Problem List   Diagnosis Date Noted  . Iron deficiency anemia 12/03/2019  . Tubular adenoma of colon 10/24/2018  . Essential hypertension 12/26/2017  . Prediabetes 12/26/2017    Past Medical History:  Diagnosis Date  . Hypertension   . Pre-diabetes     Family History  Problem Relation Age of Onset  . Thyroid disease Mother   . Heart attack Father   . Hypertension Father   . Healthy Brother   . Healthy Daughter   . Colon cancer Neg Hx    Past Surgical History:  Procedure Laterality Date  . CESAREAN SECTION    . DILATION AND CURETTAGE, DIAGNOSTIC / THERAPEUTIC     Social History   Social History Narrative  . Not on file   Immunization History  Administered Date(s) Administered  . Influenza,inj,Quad PF,6+ Mos 10/24/2018, 12/02/2019  . PFIZER(Purple Top)SARS-COV-2 Vaccination 01/19/2020     Objective: Vital Signs: There were no vitals taken for this visit.   Physical Exam   Musculoskeletal Exam: ***  CDAI Exam: CDAI Score: -- Patient Global: --; Provider Global: -- Swollen:  --; Tender: -- Joint Exam 05/31/2020   No joint exam has been documented for this visit   There is currently no information documented on the homunculus. Go to the Rheumatology activity and complete the homunculus joint exam.  Investigation: No additional findings.  Imaging: No results found.  Recent Labs: Lab Results  Component Value Date   WBC 3.5 (L) 03/03/2020   HGB 13.3 03/03/2020   PLT 206.0 03/03/2020   NA 135 11/24/2019   K 3.9 11/24/2019   CL 103 11/24/2019   CO2 26 11/24/2019   GLUCOSE 131 (H) 11/24/2019   BUN 14 11/24/2019   CREATININE 0.50 11/24/2019   BILITOT 0.6 11/24/2019   ALKPHOS 54 05/21/2019   AST 21 11/24/2019   ALT 23 11/24/2019   PROT 7.0 11/24/2019   ALBUMIN 4.2 05/21/2019   CALCIUM 9.2 11/24/2019   GFRAA 138 11/24/2019    Speciality Comments: No specialty comments available.  Procedures:  No procedures performed Allergies: Patient has no known allergies.   Assessment / Plan:     Visit Diagnoses: No diagnosis found.  Orders: No orders of the defined types were placed in this encounter.  No orders of the defined types were placed in this encounter.   Face-to-face time spent with patient was *** minutes. Greater than 50% of time was spent in counseling and coordination of care.  Follow-Up Instructions: No follow-ups on file.   Earnestine Mealing, CMA  Note - This record has been created using Editor, commissioning.  Chart creation errors have been sought, but may not always  have been located.  Such creation errors do not reflect on  the standard of medical care.

## 2020-05-31 ENCOUNTER — Ambulatory Visit: Payer: 59 | Admitting: Rheumatology

## 2020-05-31 DIAGNOSIS — R202 Paresthesia of skin: Secondary | ICD-10-CM

## 2020-05-31 DIAGNOSIS — I10 Essential (primary) hypertension: Secondary | ICD-10-CM

## 2020-05-31 DIAGNOSIS — G8929 Other chronic pain: Secondary | ICD-10-CM

## 2020-05-31 DIAGNOSIS — D126 Benign neoplasm of colon, unspecified: Secondary | ICD-10-CM

## 2020-05-31 DIAGNOSIS — R768 Other specified abnormal immunological findings in serum: Secondary | ICD-10-CM

## 2020-05-31 DIAGNOSIS — E559 Vitamin D deficiency, unspecified: Secondary | ICD-10-CM

## 2020-05-31 DIAGNOSIS — R7303 Prediabetes: Secondary | ICD-10-CM

## 2020-05-31 DIAGNOSIS — M791 Myalgia, unspecified site: Secondary | ICD-10-CM

## 2020-05-31 DIAGNOSIS — M3500 Sicca syndrome, unspecified: Secondary | ICD-10-CM

## 2020-05-31 DIAGNOSIS — R5383 Other fatigue: Secondary | ICD-10-CM

## 2020-05-31 NOTE — Progress Notes (Signed)
Office Visit Note  Patient: Joanne Jackson             Date of Birth: 1975/10/13           MRN: [id number]             PCP: Orma Flaming, MD Referring: Orma Flaming, MD Visit Date: 06/01/2020 Occupation: @GUAROCC @  Subjective:  Dry mouth and dry eyes.   History of Present Illness: Joanne Jackson is a 45 y.o. female Sjogren's syndrome.  She continues to have dry mouth and dry eyes.  She states her symptoms are manageable with over-the-counter products.  She has been using Restasis as well.  She denies any joint pain.  She states the myalgias resolved.  Her knee joints discomfort has improved.  She is concerned as her white cell count was low recently.  She would like to repeat white cell count.  Activities of Daily Living:  Patient reports morning stiffness for 0 minutes.   Patient Denies nocturnal pain.  Difficulty dressing/grooming: Denies Difficulty climbing stairs: Denies Difficulty getting out of chair: Denies Difficulty using hands for taps, buttons, cutlery, and/or writing: Denies  Review of Systems  Constitutional: Negative for fatigue.  HENT: Positive for mouth dryness. Negative for mouth sores and nose dryness.   Eyes: Positive for dryness. Negative for pain and itching.  Respiratory: Negative for shortness of breath and difficulty breathing.   Cardiovascular: Negative for chest pain and palpitations.  Gastrointestinal: Negative for blood in stool, constipation and diarrhea.  Endocrine: Negative for increased urination.  Genitourinary: Negative for difficulty urinating.  Musculoskeletal: Negative for arthralgias, joint pain, joint swelling, myalgias, morning stiffness, muscle tenderness and myalgias.  Skin: Negative for color change, rash and redness.  Allergic/Immunologic: Negative for susceptible to infections.  Neurological: Negative for dizziness, numbness, headaches, memory loss and weakness.  Hematological: Negative for bruising/bleeding tendency.   Psychiatric/Behavioral: Negative for confusion.    PMFS History:  Patient Active Problem List   Diagnosis Date Noted  . Iron deficiency anemia 12/03/2019  . Tubular adenoma of colon 10/24/2018  . Essential hypertension 12/26/2017  . Prediabetes 12/26/2017    Past Medical History:  Diagnosis Date  . Hypertension   . Pre-diabetes     Family History  Problem Relation Age of Onset  . Thyroid disease Mother   . Heart attack Father   . Hypertension Father   . Healthy Brother   . Healthy Daughter   . Colon cancer Neg Hx    Past Surgical History:  Procedure Laterality Date  . CESAREAN SECTION    . DILATION AND CURETTAGE, DIAGNOSTIC / THERAPEUTIC     Social History   Social History Narrative  . Not on file   Immunization History  Administered Date(s) Administered  . Influenza,inj,Quad PF,6+ Mos 10/24/2018, 12/02/2019  . Moderna Sars-Covid-2 Vaccination 05/29/2019, 06/19/2019  . PFIZER(Purple Top)SARS-COV-2 Vaccination 01/19/2020     Objective: Vital Signs: BP 115/77 (BP Location: Left Arm, Patient Position: Sitting, Cuff Size: Normal)   Pulse 78   Resp 13   Ht 4\' 11"  (1.499 m)   Wt 158 lb 12.8 oz (72 kg)   BMI 32.07 kg/m    Physical Exam Vitals and nursing note reviewed.  Constitutional:      Appearance: She is well-developed.  HENT:     Head: Normocephalic and atraumatic.  Eyes:     Conjunctiva/sclera: Conjunctivae normal.  Cardiovascular:     Rate and Rhythm: Normal rate and regular rhythm.     Heart sounds: Normal heart  sounds.  Pulmonary:     Effort: Pulmonary effort is normal.     Breath sounds: Normal breath sounds.  Abdominal:     General: Bowel sounds are normal.     Palpations: Abdomen is soft.  Musculoskeletal:     Cervical back: Normal range of motion.  Lymphadenopathy:     Cervical: No cervical adenopathy.  Skin:    General: Skin is warm and dry.     Capillary Refill: Capillary refill takes less than 2 seconds.  Neurological:     Mental  Status: She is alert and oriented to person, place, and time.  Psychiatric:        Behavior: Behavior normal.      Musculoskeletal Exam: Spine thoracic and lumbar spine were in good range of motion.  Shoulder joints, elbow joints, wrist joints, MCPs PIPs and DIPs with good range of motion with no synovitis.  Hip joints, knee joints, ankles, MTPs and PIPs with good range of motion with no synovitis.  CDAI Exam: CDAI Score: -- Patient Global: --; Provider Global: -- Swollen: --; Tender: -- Joint Exam 06/01/2020   No joint exam has been documented for this visit   There is currently no information documented on the homunculus. Go to the Rheumatology activity and complete the homunculus joint exam.  Investigation: No additional findings.  Imaging: No results found.  Recent Labs: Lab Results  Component Value Date   WBC 3.5 (L) 03/03/2020   HGB 13.3 03/03/2020   PLT 206.0 03/03/2020   NA 135 11/24/2019   K 3.9 11/24/2019   CL 103 11/24/2019   CO2 26 11/24/2019   GLUCOSE 131 (H) 11/24/2019   BUN 14 11/24/2019   CREATININE 0.50 11/24/2019   BILITOT 0.6 11/24/2019   ALKPHOS 54 05/21/2019   AST 21 11/24/2019   ALT 23 11/24/2019   PROT 7.0 11/24/2019   ALBUMIN 4.2 05/21/2019   CALCIUM 9.2 11/24/2019   GFRAA 138 11/24/2019    Speciality Comments: No specialty comments available.  Procedures:  No procedures performed Allergies: Patient has no known allergies.   Assessment / Plan:     Visit Diagnoses: 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HCC) - ANA 1:160 NS, RF<14, sed rate 26, Ro+: He has been using over-the-counter products which has been helpful.  She is also on Restasis.  She denies any history of shortness of breath or palpitations.  She has had EKG in the past which was normal per patient.  Other neutropenia ([place]) -recent labs showed mild decrease in the white cell count.  Per her request I will repeat CBC today.  Plan: CBC with  Differential/Platelet  Paresthesia of both hands-resolved.  Vitamin D deficiency-she has been taking vitamin D on a regular basis.  Her last vitamin D level was normal.  Other fatigue-she has noticed improvement in her fatigue.  Chronic pain of both knees - X-rays of both knee joints were unremarkable on 11/25/18.  She is currently not having any discomfort in her knee joints.  Essential hypertension-her blood pressure is normal now with medications.  Prediabetes-dietary modifications were discussed.  Tubular adenoma of colon  Orders: Orders Placed This Encounter  Procedures  . CBC with Differential/Platelet   No orders of the defined types were placed in this encounter.    Follow-Up Instructions: Return in about 6 months (around 12/01/2020) for Sjogren's.   Bo Merino, MD  Note - This record has been created using Editor, commissioning.  Chart creation errors have been sought, but may not always  have been located. Such creation errors do not reflect on  the standard of medical care.

## 2020-06-01 ENCOUNTER — Other Ambulatory Visit: Payer: Self-pay

## 2020-06-01 ENCOUNTER — Encounter: Payer: Self-pay | Admitting: Rheumatology

## 2020-06-01 ENCOUNTER — Ambulatory Visit: Payer: 59 | Admitting: Rheumatology

## 2020-06-01 VITALS — BP 115/77 | HR 78 | Resp 13 | Ht 59.0 in | Wt 158.8 lb

## 2020-06-01 DIAGNOSIS — R202 Paresthesia of skin: Secondary | ICD-10-CM

## 2020-06-01 DIAGNOSIS — M25562 Pain in left knee: Secondary | ICD-10-CM

## 2020-06-01 DIAGNOSIS — D126 Benign neoplasm of colon, unspecified: Secondary | ICD-10-CM

## 2020-06-01 DIAGNOSIS — R7303 Prediabetes: Secondary | ICD-10-CM

## 2020-06-01 DIAGNOSIS — M3501 Sicca syndrome with keratoconjunctivitis: Secondary | ICD-10-CM | POA: Diagnosis not present

## 2020-06-01 DIAGNOSIS — M25561 Pain in right knee: Secondary | ICD-10-CM

## 2020-06-01 DIAGNOSIS — G8929 Other chronic pain: Secondary | ICD-10-CM

## 2020-06-01 DIAGNOSIS — D708 Other neutropenia: Secondary | ICD-10-CM | POA: Diagnosis not present

## 2020-06-01 DIAGNOSIS — E559 Vitamin D deficiency, unspecified: Secondary | ICD-10-CM

## 2020-06-01 DIAGNOSIS — I10 Essential (primary) hypertension: Secondary | ICD-10-CM

## 2020-06-01 DIAGNOSIS — M3500 Sicca syndrome, unspecified: Secondary | ICD-10-CM

## 2020-06-01 DIAGNOSIS — R768 Other specified abnormal immunological findings in serum: Secondary | ICD-10-CM

## 2020-06-01 DIAGNOSIS — M791 Myalgia, unspecified site: Secondary | ICD-10-CM

## 2020-06-01 DIAGNOSIS — R5383 Other fatigue: Secondary | ICD-10-CM

## 2020-06-01 LAB — CBC WITH DIFFERENTIAL/PLATELET
Absolute Monocytes: 374 cells/uL (ref 200–950)
Basophils Absolute: 21 cells/uL (ref 0–200)
Basophils Relative: 0.5 %
Eosinophils Absolute: 50 cells/uL (ref 15–500)
Eosinophils Relative: 1.2 %
HCT: 40.6 % (ref 35.0–45.0)
Hemoglobin: 13.4 g/dL (ref 11.7–15.5)
Lymphs Abs: 1567 cells/uL (ref 850–3900)
MCH: 28.9 pg (ref 27.0–33.0)
MCHC: 33 g/dL (ref 32.0–36.0)
MCV: 87.5 fL (ref 80.0–100.0)
MPV: 10 fL (ref 7.5–12.5)
Monocytes Relative: 8.9 %
Neutro Abs: 2188 cells/uL (ref 1500–7800)
Neutrophils Relative %: 52.1 %
Platelets: 226 10*3/uL (ref 140–400)
RBC: 4.64 10*6/uL (ref 3.80–5.10)
RDW: 13.4 % (ref 11.0–15.0)
Total Lymphocyte: 37.3 %
WBC: 4.2 10*3/uL (ref 3.8–10.8)

## 2020-06-03 ENCOUNTER — Encounter: Payer: Self-pay | Admitting: Family Medicine

## 2020-07-31 ENCOUNTER — Encounter: Payer: Self-pay | Admitting: Rheumatology

## 2020-07-31 DIAGNOSIS — R21 Rash and other nonspecific skin eruption: Secondary | ICD-10-CM

## 2020-08-01 NOTE — Telephone Encounter (Signed)
The rashes look like hives although it is difficult to assess in the pictures.  An autoimmune rash which can be associated with SSA antibody can also look similar.  I would strongly recommend that you should see a dermatologist to establish the diagnosis.  Sunscreen with SPF more than 50 is also advised.  Please refer patient to a dermatologist if she does not have one.

## 2020-08-03 ENCOUNTER — Encounter: Payer: Self-pay | Admitting: Radiology

## 2020-08-04 ENCOUNTER — Encounter: Payer: Self-pay | Admitting: Family Medicine

## 2020-08-04 ENCOUNTER — Telehealth: Payer: 59 | Admitting: Family Medicine

## 2020-08-04 DIAGNOSIS — R21 Rash and other nonspecific skin eruption: Secondary | ICD-10-CM | POA: Diagnosis not present

## 2020-08-04 NOTE — Progress Notes (Signed)
Virtual Visit via Video Note  I connected with Joanne Jackson  on 08/04/20 at 11:40 AM EDT by a video enabled telemedicine application and verified that I am speaking with the correct person using two identifiers.  Location patient: home, [place] Location provider:work or home office Persons participating in the virtual visit: patient, provider  I discussed the limitations of evaluation and management by telemedicine and the availability of in person appointments. The patient expressed understanding and agreed to proceed.   HPI:  Acute telemedicine visit for rash: -Onset:about 5-6 days ago -Symptoms include: itchy rash on neck, arms and trunk on and off  - looks like small mosquitoes bites -she went to an urgent care 4 days ago and was given prednisone and allergy pill -now resolved today -she also talked with her rheumatologist because of her history of Sjogren's and was referred to dermatologist and will see them next week -Denies:fevers, chills, malaise, new exposures, pets, forest or woods exposures, tick bites -Has tried:hydrocortisone cream seems to help -Pertinent medication allergies:No Known Allergies  ROS: See pertinent positives and negatives per HPI.  Past Medical History:  Diagnosis Date   Hypertension    Pre-diabetes     Past Surgical History:  Procedure Laterality Date   CESAREAN SECTION     DILATION AND CURETTAGE, DIAGNOSTIC / THERAPEUTIC       Current Outpatient Medications:    Azelastine-Fluticasone 137-50 MCG/ACT SUSP, PLACE 1 SPRAY INTO THE NOSE 2 (TWO) TIMES DAILY., Disp: 23 g, Rfl: 2   Cholecalciferol (VITAMIN D3) 50 MCG (2000 UT) TABS, Take 2,000 Units by mouth daily as needed. , Disp: , Rfl:    ferrous sulfate 324 (65 Fe) MG TBEC, Take 1 tablet (325 mg total) by mouth daily., Disp: 90 tablet, Rfl: 3   lisinopril (ZESTRIL) 40 MG tablet, TAKE 1 TABLET BY MOUTH EVERY DAY, Disp: 90 tablet, Rfl: 1   RESTASIS 0.05 % ophthalmic emulsion, , Disp: , Rfl:   EXAM:  VITALS  per patient if applicable:  GENERAL: alert, oriented, appears well and in no acute distress  HEENT: atraumatic, conjunttiva clear, no obvious abnormalities on inspection of external nose and ears  NECK: normal movements of the head and neck  LUNGS: on inspection no signs of respiratory distress, breathing rate appears normal, no obvious gross SOB, gasping or wheezing  CV: no obvious cyanosis  MS: moves all visible extremities without noticeable abnormality  PSYCH/NEURO: pleasant and cooperative, no obvious depression or anxiety, speech and thought processing grossly intact  ASSESSMENT AND PLAN:  Discussed the following assessment and plan:  Rash  -we discussed possible serious and likely etiologies, options for evaluation and workup, limitations of telemedicine visit vs in person visit, treatment, treatment risks and precautions. Pt prefers to treat via telemedicine empirically rather than in person at this moment. I am glad that the rash has now resolved and that she sees dermatologist next week. Advised can try topical hydrocortisone cream if any lesions appear in the interim. Advised to seek prompt in person care if worsening, new symptoms arise, or if is not improving with treatment.   I discussed the assessment and treatment plan with the patient. The patient was provided an opportunity to ask questions and all were answered. The patient agreed with the plan and demonstrated an understanding of the instructions.     Lucretia Kern, DO

## 2020-08-04 NOTE — Patient Instructions (Signed)
   I am glad you are feeling better.  Please keep the appointment with the dermatologist for follow up.  Please use hydrocortisone cream 1-2 times daily if needed if any further issues.   Seek in person care promptly if your symptoms worsen, new concerns arise or you are not improving with treatment.  It was nice to meet you today. I help [facility] out with telemedicine visits on Tuesdays and Thursdays and am available for visits on those days. If you have any concerns or questions following this visit please schedule a follow up visit with your Primary Care doctor or seek care at a local urgent care clinic to avoid delays in care.

## 2020-09-02 ENCOUNTER — Other Ambulatory Visit: Payer: Self-pay | Admitting: Family Medicine

## 2020-09-15 ENCOUNTER — Other Ambulatory Visit: Payer: Self-pay | Admitting: Family Medicine

## 2020-09-21 ENCOUNTER — Ambulatory Visit: Payer: Self-pay | Admitting: Obstetrics & Gynecology

## 2020-10-25 ENCOUNTER — Encounter: Payer: Self-pay | Admitting: Family Medicine

## 2020-10-25 ENCOUNTER — Ambulatory Visit (INDEPENDENT_AMBULATORY_CARE_PROVIDER_SITE_OTHER): Payer: 59 | Admitting: Family Medicine

## 2020-10-25 ENCOUNTER — Other Ambulatory Visit: Payer: Self-pay | Admitting: Obstetrics & Gynecology

## 2020-10-25 ENCOUNTER — Other Ambulatory Visit: Payer: Self-pay

## 2020-10-25 VITALS — BP 150/100 | HR 74 | Temp 98.2°F | Ht 59.0 in | Wt 158.0 lb

## 2020-10-25 DIAGNOSIS — D500 Iron deficiency anemia secondary to blood loss (chronic): Secondary | ICD-10-CM | POA: Diagnosis not present

## 2020-10-25 DIAGNOSIS — Z23 Encounter for immunization: Secondary | ICD-10-CM

## 2020-10-25 DIAGNOSIS — Z1231 Encounter for screening mammogram for malignant neoplasm of breast: Secondary | ICD-10-CM

## 2020-10-25 DIAGNOSIS — E559 Vitamin D deficiency, unspecified: Secondary | ICD-10-CM

## 2020-10-25 DIAGNOSIS — I10 Essential (primary) hypertension: Secondary | ICD-10-CM | POA: Diagnosis not present

## 2020-10-25 DIAGNOSIS — R7303 Prediabetes: Secondary | ICD-10-CM | POA: Diagnosis not present

## 2020-10-25 DIAGNOSIS — Z0000 Encounter for general adult medical examination without abnormal findings: Secondary | ICD-10-CM

## 2020-10-25 DIAGNOSIS — D126 Benign neoplasm of colon, unspecified: Secondary | ICD-10-CM

## 2020-10-25 DIAGNOSIS — M3501 Sicca syndrome with keratoconjunctivitis: Secondary | ICD-10-CM | POA: Insufficient documentation

## 2020-10-25 DIAGNOSIS — Z8632 Personal history of gestational diabetes: Secondary | ICD-10-CM | POA: Insufficient documentation

## 2020-10-25 LAB — COMPREHENSIVE METABOLIC PANEL
ALT: 22 U/L (ref 0–35)
AST: 21 U/L (ref 0–37)
Albumin: 4.3 g/dL (ref 3.5–5.2)
Alkaline Phosphatase: 49 U/L (ref 39–117)
BUN: 9 mg/dL (ref 6–23)
CO2: 25 mEq/L (ref 19–32)
Calcium: 9.5 mg/dL (ref 8.4–10.5)
Chloride: 101 mEq/L (ref 96–112)
Creatinine, Ser: 0.54 mg/dL (ref 0.40–1.20)
GFR: 112.37 mL/min (ref >60.00)
Glucose, Bld: 92 mg/dL (ref 70–99)
Potassium: 4 mEq/L (ref 3.5–5.1)
Sodium: 137 mEq/L (ref 135–145)
Total Bilirubin: 0.6 mg/dL (ref 0.2–1.2)
Total Protein: 8 g/dL (ref 6.0–8.3)

## 2020-10-25 LAB — CBC WITH DIFFERENTIAL/PLATELET
Basophils Absolute: 0 10*3/uL (ref 0.0–0.1)
Basophils Relative: 0.4 % (ref 0.0–3.0)
Eosinophils Absolute: 0.1 10*3/uL (ref 0.0–0.7)
Eosinophils Relative: 2.6 % (ref 0.0–5.0)
HCT: 39.9 % (ref 36.0–46.0)
Hemoglobin: 13.5 g/dL (ref 12.0–15.0)
Lymphocytes Relative: 28.7 % (ref 12.0–46.0)
Lymphs Abs: 1.2 10*3/uL (ref 0.7–4.0)
MCHC: 33.8 g/dL (ref 30.0–36.0)
MCV: 87.7 fl (ref 78.0–100.0)
Monocytes Absolute: 0.3 10*3/uL (ref 0.1–1.0)
Monocytes Relative: 7.1 % (ref 3.0–12.0)
Neutro Abs: 2.5 10*3/uL (ref 1.4–7.7)
Neutrophils Relative %: 61.2 % (ref 43.0–77.0)
Platelets: 242 10*3/uL (ref 150.0–400.0)
RBC: 4.55 Mil/uL (ref 3.87–5.11)
RDW: 12.6 % (ref 11.5–15.5)
WBC: 4.1 10*3/uL (ref 4.0–10.5)

## 2020-10-25 LAB — LIPID PANEL
Cholesterol: 167 mg/dL (ref 0–200)
HDL: 47.7 mg/dL (ref >39.00)
LDL Cholesterol: 82 mg/dL (ref 0–99)
NonHDL: 119.21
Total CHOL/HDL Ratio: 3
Triglycerides: 187 mg/dL — ABNORMAL HIGH (ref 0.0–149.0)
VLDL: 37.4 mg/dL (ref 0.0–40.0)

## 2020-10-25 LAB — POCT GLYCOSYLATED HEMOGLOBIN (HGB A1C): Hemoglobin A1C: 5.8 % — AB (ref 4.0–5.6)

## 2020-10-25 LAB — VITAMIN D 25 HYDROXY (VIT D DEFICIENCY, FRACTURES): VITD: 31.36 ng/mL (ref 30.00–100.00)

## 2020-10-25 LAB — TSH: TSH: 0.79 u[IU]/mL (ref 0.35–5.50)

## 2020-10-25 MED ORDER — HYDROCHLOROTHIAZIDE 12.5 MG PO CAPS: 12.5000 mg | ORAL_CAPSULE | Freq: Every day | ORAL | 3 refills | Status: DC

## 2020-10-25 NOTE — Patient Instructions (Signed)
Please return in 3 months for blood pressure recheck. Bring in your log of BP readings from home and add the new medication today.   I will release your lab results to you on your MyChart account with further instructions. Please reply with any questions.    Today you were given your flu vaccination.   Your sugar test is stable today. Keep eating well!  It was a pleasure seeing you today! Thank you for choosing [place] to meet your healthcare needs! I truly look forward to working with you. If you have any questions or concerns, please send me a message via Mychart or call the office at [phone].   Please do these things to maintain good health!  Exercise at least 30-45 minutes a day,  4-5 days a week.  Eat a low-fat diet with lots of fruits and vegetables, up to 7-9 servings per day. Drink plenty of water daily. Try to drink 8 8oz glasses per day. Seatbelts can save your life. Always wear your seatbelt. Place Smoke Detectors on every level of your home and check batteries every year. Schedule an appointment with an eye doctor for an eye exam every 1-2 years Safe sex - use condoms to protect yourself from STDs if you could be exposed to these types of infections. Use birth control if you do not want to become pregnant and are sexually active. Avoid heavy alcohol use. If you drink, keep it to less than 2 drinks/day and not every day. [place].  Choose someone you trust that could speak for you if you became unable to speak for yourself. Depression is common in our stressful world.If you're feeling down or losing interest in things you normally enjoy, please come in for a visit. If anyone is threatening or hurting you, please get help. Physical or Emotional Violence is never OK.

## 2020-10-25 NOTE — Progress Notes (Signed)
Subjective  Chief Complaint  Patient presents with   Transitions Of Care   Hypertension   Diabetes    HPI: Joanne Jackson is a 45 y.o. female who presents to [facility] today for a Female Wellness Visit. She also has the concerns and/or needs as listed above in the chief complaint. These will be addressed in addition to the Health Maintenance Visit.   Wellness Visit: annual visit with health maintenance review and exam without Pap  HM: sees gyn for female wellness. H/o fibroids. Heavy menses on iron tx. No BC needed: husband is infertile. 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 Little exercise. Diet is fair. Chronic disease f/u and/or acute problem visit: (deemed necessary to be done in addition to the wellness visit): HTN: Feeling well. Taking medications w/o adverse effects. No symptoms of CHF, angina; no palpitations, sob, cp or lower extremity edema. Compliant with meds. Has URI and is taking a decongestant. Todays bp is thus elevated. Home readings: reports diastolic is often 16S-06T.  Prediabetes: diet is stable. Weight is fairly stable. No sxs of hyperglycemia Lab Results  Component Value Date   HGBA1C 5.8 (A) 10/25/2020   HGBA1C 6.0 03/03/2020   HGBA1C 6.3 (H) 12/02/2019     Sjogrren's: reviewed rheum notes. On otc meds and restasis.   Assessment  1. Annual physical exam   2. Prediabetes   3. Essential hypertension   4. Tubular adenoma of colon   5. Iron deficiency anemia due to chronic blood loss   6. 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[place])   7. Vitamin D deficiency      Plan  Female Wellness Visit: Age appropriate Health Maintenance and Prevention measures were discussed with patient. Included topics are cancer screening recommendations, ways to keep healthy (see AVS) including dietary and exercise recommendations, regular eye and dental care, use of seat belts, and avoidance of moderate alcohol use and tobacco use. Screens  are up to date BMI: discussed patient's BMI and encouraged positive lifestyle modifications to help get to or maintain a target BMI. HM needs and immunizations were addressed and ordered. See below for orders. See HM and immunization section for updates. Flu shot today Routine labs and screening tests ordered including cmp, cbc and lipids where appropriate. Discussed recommendations regarding Vit D and calcium supplementation (see AVS)  Chronic disease management visit and/or acute problem visit: Prediabetes: stable. Continue healthy diet HTN: elevated reading today in part due to meds but home readings show high diastolics. Add low dose hctz 12.5 to lisinopril 40 daily. Monitor at home and return in 3 months for recheck. Check lytes and renal today Recheck Vit D and iron studies. On oral iron supp.   Follow up: Return in about 6 months (around 04/24/2021) for follow up Hypertension and sugars.  Orders Placed This Encounter  Procedures   CBC with Differential/Platelet   Comprehensive metabolic panel   Iron, TIBC and Ferritin Panel   Lipid panel   VITAMIN D 25 Hydroxy (Vit-D Deficiency, Fractures)   TSH   POCT HgB A1C   Meds ordered this encounter  Medications   hydrochlorothiazide (MICROZIDE) 12.5 MG capsule    Sig: Take 1 capsule (12.5 mg total) by mouth daily.    Dispense:  90 capsule    Refill:  3       Body mass index is 31.91 kg/m. Wt Readings from Last 3 Encounters:  10/25/20 158 lb (71.7 kg)  06/01/20 158 lb 12.8 oz (  72 kg)  03/03/20 157 lb 6.4 oz (71.4 kg)     Patient Active Problem List   Diagnosis Date Noted   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[place]) 10/25/2020    Priority: High   Tubular adenoma of colon 10/24/2018    Priority: High    Repeat cscope in 2027.     Essential hypertension 12/26/2017    Priority: High    Diagnosed in 2012.     Iron deficiency anemia 12/03/2019    Priority: Medium    dx'd 11/2019.     Prediabetes 12/26/2017     Priority: Medium   History of gestational diabetes 10/25/2020    Priority: Low   Intramural leiomyoma of uterus 07/29/2015    Priority: Low   Health Maintenance  Topic Date Due   COVID-19 Vaccine (4 - Booster) 04/12/2020   INFLUENZA VACCINE  09/05/2020   MAMMOGRAM  12/17/2020   PAP SMEAR-Modifier  11/11/2021   COLONOSCOPY (Pts 45-39yrs Insurance coverage will need to be confirmed)  09/22/2025   TETANUS/TDAP  10/13/2025   Hepatitis C Screening  Completed   HIV Screening  Completed   HPV VACCINES  Aged Out   Immunization History  Administered Date(s) Administered   Influenza,inj,Quad PF,6+ Mos 11/11/2015, 10/24/2018, 12/02/2019   Moderna Sars-Covid-2 Vaccination 05/29/2019, 06/19/2019   PFIZER(Purple Top)SARS-COV-2 Vaccination 01/19/2020   Tdap 10/14/2015   We updated and reviewed the patient's past history in detail and it is documented below. Allergies: Patient has No Known Allergies. Past Medical History Patient  has a past medical history of Hypertension, Pre-diabetes, and 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[place]). Past Surgical History Patient  has a past surgical history that includes Cesarean section and Dilation and curettage, diagnostic / therapeutic. Family History: Patient family history includes Healthy in her brother and daughter; Heart attack in her father; Hypertension in her father; Thyroid disease in her mother. Social History:  Patient  reports that she has never smoked. She has never used smokeless tobacco. She reports that she does not currently use alcohol. She reports that she does not use drugs.  Review of Systems: Constitutional: negative for fever or malaise Ophthalmic: negative for photophobia, double vision or loss of vision Cardiovascular: negative for chest pain, dyspnea on exertion, or new LE swelling Respiratory: negative for SOB or persistent cough Gastrointestinal: negative for abdominal pain, change in bowel habits or  melena Genitourinary: negative for dysuria or gross hematuria, no abnormal uterine bleeding or disharge Musculoskeletal: negative for new gait disturbance or muscular weakness Integumentary: negative for new or persistent rashes, no breast lumps Neurological: negative for TIA or stroke symptoms Psychiatric: negative for SI or delusions Allergic/Immunologic: negative for hives  Patient Care Team    Relationship Specialty Notifications Start End  Leamon Arnt, MD PCP - General Family Medicine  10/25/20   Princess Bruins, MD Consulting Physician Obstetrics and Gynecology  10/24/18   Bo Merino, MD Consulting Physician Rheumatology  10/25/20     Objective  Vitals: BP (!) 150/100   Pulse 74   Temp 98.2 F (36.8 C) (Temporal)   Ht 4\' 11"  (1.499 m)   Wt 158 lb (71.7 kg)   LMP 10/17/2020 (Exact Date)   SpO2 100%   BMI 31.91 kg/m  General:  Well developed, well nourished, no acute distress  Psych:  Alert and orientedx3,normal mood and affect HEENT:  Normocephalic, atraumatic, non-icteric sclera,  supple neck without adenopathy, mass or thyromegaly Cardiovascular:  Normal S1, S2, RRR without gallop, rub or murmur Respiratory:  Good breath sounds bilaterally, CTAB with normal respiratory effort Gastrointestinal: normal bowel sounds, soft, non-tender, no noted masses. No HSM MSK: no deformities, contusions. Joints are without erythema or swelling.  Skin:  Warm, no rashes or suspicious lesions noted Neurologic:    Mental status is normal.  Gross motor and sensory exams are normal. Normal gait. No tremor   Commons side effects, risks, benefits, and alternatives for medications and treatment plan prescribed today were discussed, and the patient expressed understanding of the given instructions. Patient is instructed to call or message via MyChart if he/she has any questions or concerns regarding our treatment plan. No barriers to understanding were identified. We discussed Red Flag  symptoms and signs in detail. Patient expressed understanding regarding what to do in case of urgent or emergency type symptoms.  Medication list was reconciled, printed and provided to the patient in AVS. Patient instructions and summary information was reviewed with the patient as documented in the AVS. This note was prepared with assistance of Dragon voice recognition software. Occasional wrong-word or sound-a-like substitutions may have occurred due to the inherent limitations of voice recognition software  This visit occurred during the SARS-CoV-2 public health emergency.  Safety protocols were in place, including screening questions prior to the visit, additional usage of staff PPE, and extensive cleaning of exam room while observing appropriate contact time as indicated for disinfecting solutions.

## 2020-10-25 NOTE — Progress Notes (Signed)
Office Visit Note  Patient: Joanne Jackson             Date of Birth: 06-20-1975           MRN: [id number]             PCP: Leamon Arnt, MD Referring: Orma Flaming, MD Visit Date: 11/08/2020 Occupation: @GUAROCC @  Subjective:  No chief complaint on file.   History of Present Illness: Joanne Jackson is a 45 y.o. female with a history of Sjogren's syndrome.  As she continues to have dry mouth and dry eye symptoms.  She is using Restasis eyedrops and some over-the-counter products.  Her symptoms are manageable.  She states she has been using carpal tunnel braces at nighttime and the tingling in her hands has improved.  She denies any knee joint discomfort now.  Her fatigue is improved.  She recently had vitamin D level done by her PCP which showed low normal levels of vitamin D.  She has been taking vitamin D 2000 units a day.  She denies any shortness of breath or palpitations.  There is no history of rash.  Activities of Daily Living:  Patient reports morning stiffness for 0 minutes.   Patient Denies nocturnal pain.  Difficulty dressing/grooming: Denies Difficulty climbing stairs: Denies Difficulty getting out of chair: Denies Difficulty using hands for taps, buttons, cutlery, and/or writing: Denies  Review of Systems  Constitutional:  Negative for fatigue, night sweats, weight gain and weight loss.  HENT:  Positive for mouth dryness. Negative for mouth sores, trouble swallowing, trouble swallowing and nose dryness.   Eyes:  Positive for dryness. Negative for pain, redness, itching and visual disturbance.  Respiratory:  Negative for cough, shortness of breath and difficulty breathing.   Cardiovascular:  Negative for chest pain, palpitations, hypertension, irregular heartbeat and swelling in legs/feet.  Gastrointestinal:  Positive for diarrhea. Negative for blood in stool and constipation.  Endocrine: Negative for increased urination.  Genitourinary:  Negative for difficulty  urinating and vaginal dryness.  Musculoskeletal:  Negative for joint pain, joint pain, joint swelling, myalgias, muscle weakness, morning stiffness, muscle tenderness and myalgias.  Skin:  Negative for color change, rash, hair loss, redness, skin tightness, ulcers and sensitivity to sunlight.  Allergic/Immunologic: Negative for susceptible to infections.  Neurological:  Positive for numbness. Negative for dizziness, headaches, memory loss, night sweats and weakness.  Hematological:  Negative for bruising/bleeding tendency and swollen glands.  Psychiatric/Behavioral:  Positive for behavioral problems. Negative for depressed mood, confusion, decreased concentration and sleep disturbance. The patient is not nervous/anxious.    PMFS History:  Patient Active Problem List   Diagnosis Date Noted   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[place]) 10/25/2020   History of gestational diabetes 10/25/2020   Iron deficiency anemia 12/03/2019   Tubular adenoma of colon 10/24/2018   Essential hypertension 12/26/2017   Prediabetes 12/26/2017   Intramural leiomyoma of uterus 07/29/2015    Past Medical History:  Diagnosis Date   Hypertension    Pre-diabetes    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[place])     Family History  Problem Relation Age of Onset   Thyroid disease Mother    Heart attack Father 30   Hypertension Father    Healthy Brother    Healthy Daughter    Healthy Paternal Grandfather    Colon cancer Neg Hx    Cancer Neg Hx    Stroke Neg Hx    Past Surgical History:  Procedure Laterality Date   CESAREAN  SECTION     DILATION AND CURETTAGE, DIAGNOSTIC / THERAPEUTIC     Social History   Social History Narrative   Not on file   Immunization History  Administered Date(s) Administered   Influenza,inj,Quad PF,6+ Mos 11/11/2015, 10/24/2018, 12/02/2019, 10/25/2020   Moderna Sars-Covid-2 Vaccination 05/29/2019, 06/19/2019   PFIZER(Purple Top)SARS-COV-2 Vaccination  01/19/2020   Tdap 10/14/2015     Objective: Vital Signs: BP 108/76 (BP Location: Left Arm, Patient Position: Sitting, Cuff Size: Normal)   Pulse 70   Ht 4\' 11"  (1.499 m)   Wt 160 lb 3.2 oz (72.7 kg)   LMP 10/17/2020 (Exact Date)   BMI 32.36 kg/m    Physical Exam Vitals and nursing note reviewed.  Constitutional:      Appearance: She is well-developed.  HENT:     Head: Normocephalic and atraumatic.  Eyes:     Conjunctiva/sclera: Conjunctivae normal.  Cardiovascular:     Rate and Rhythm: Normal rate and regular rhythm.     Heart sounds: Normal heart sounds.  Pulmonary:     Effort: Pulmonary effort is normal.     Breath sounds: Normal breath sounds.  Abdominal:     General: Bowel sounds are normal.     Palpations: Abdomen is soft.  Musculoskeletal:     Cervical back: Normal range of motion.  Lymphadenopathy:     Cervical: No cervical adenopathy.  Skin:    General: Skin is warm and dry.     Capillary Refill: Capillary refill takes less than 2 seconds.  Neurological:     Mental Status: She is alert and oriented to person, place, and time.  Psychiatric:        Behavior: Behavior normal.     Musculoskeletal Exam: C-spine was in good range of motion.  Thoracic and lumbar spine with good range of motion.  Shoulder joints, elbow joints, wrist joints, MCPs PIPs and DIPs with good range of motion with no synovitis.  Hip joints, knee joints, ankles, MTPs and PIPs with good range of motion with no synovitis.  CDAI Exam: CDAI Score: -- Patient Global: --; Provider Global: -- Swollen: --; Tender: -- Joint Exam 11/08/2020   No joint exam has been documented for this visit   There is currently no information documented on the homunculus. Go to the Rheumatology activity and complete the homunculus joint exam.  Investigation: No additional findings.  Imaging: No results found.  Recent Labs: Lab Results  Component Value Date   WBC 4.1 10/25/2020   HGB 13.5 10/25/2020    PLT 242.0 10/25/2020   NA 137 10/25/2020   K 4.0 10/25/2020   CL 101 10/25/2020   CO2 25 10/25/2020   GLUCOSE 92 10/25/2020   BUN 9 10/25/2020   CREATININE 0.54 10/25/2020   BILITOT 0.6 10/25/2020   ALKPHOS 49 10/25/2020   AST 21 10/25/2020   ALT 22 10/25/2020   PROT 8.0 10/25/2020   ALBUMIN 4.3 10/25/2020   CALCIUM 9.5 10/25/2020   GFRAA 138 11/24/2019    Speciality Comments: No specialty comments available.  Procedures:  No procedures performed Allergies: Patient has no known allergies.   Assessment / Plan:     Visit Diagnoses: 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[facility]) - ANA 1:160 NS, RF<14, sed rate 26, Ro+: She continues to have dry mouth and dry eyes.  Her dry eyes are more bothersome than dry mouth.  She has been using Restasis eyedrops.  Over-the-counter products have been useful.  She had no lymphadenopathy or parotid swelling today.  There is no history of shortness of breath or palpitations.  She denies any history of rash.  Use of sunscreen was emphasized.  Paresthesia of both hands -she had been using carpal tunnel braces at night which has been helpful.  Chronic pain of both knees - X-rays of both knee joints were unremarkable on 11/25/18.  She states the knee joint symptoms have improved.  Other fatigue-fatigue has been better.  Vitamin D deficiency-her vitamin D is low normal.  She is currently on vitamin D 2000 units daily.  I advised her to increase it to 4000 units daily and repeat vitamin D in 6 months.  Prediabetes-according to the patient her hemoglobin A1c is improving.  Essential hypertension-blood pressure is normal.  Tubular adenoma of colon  Orders: No orders of the defined types were placed in this encounter.  No orders of the defined types were placed in this encounter.    Follow-Up Instructions: Return in about 6 months (around 05/09/2021) for Sjogren's.   Bo Merino, MD  Note - This record has been created using  Editor, commissioning.  Chart creation errors have been sought, but may not always  have been located. Such creation errors do not reflect on  the standard of medical care.

## 2020-10-26 ENCOUNTER — Encounter: Payer: Self-pay | Admitting: Family Medicine

## 2020-10-26 LAB — IRON,TIBC AND FERRITIN PANEL
%SAT: 15 % (calc) — ABNORMAL LOW (ref 16–45)
Ferritin: 37 ng/mL (ref 16–232)
Iron: 52 ug/dL (ref 40–190)
TIBC: 345 mcg/dL (calc) (ref 250–450)

## 2020-11-08 ENCOUNTER — Ambulatory Visit: Payer: 59 | Admitting: Rheumatology

## 2020-11-08 ENCOUNTER — Other Ambulatory Visit: Payer: Self-pay

## 2020-11-08 ENCOUNTER — Encounter: Payer: Self-pay | Admitting: Rheumatology

## 2020-11-08 VITALS — BP 108/76 | HR 70 | Ht 59.0 in | Wt 160.2 lb

## 2020-11-08 DIAGNOSIS — I10 Essential (primary) hypertension: Secondary | ICD-10-CM

## 2020-11-08 DIAGNOSIS — M25561 Pain in right knee: Secondary | ICD-10-CM | POA: Diagnosis not present

## 2020-11-08 DIAGNOSIS — M3501 Sicca syndrome with keratoconjunctivitis: Secondary | ICD-10-CM

## 2020-11-08 DIAGNOSIS — D708 Other neutropenia: Secondary | ICD-10-CM

## 2020-11-08 DIAGNOSIS — R5383 Other fatigue: Secondary | ICD-10-CM | POA: Diagnosis not present

## 2020-11-08 DIAGNOSIS — R202 Paresthesia of skin: Secondary | ICD-10-CM | POA: Diagnosis not present

## 2020-11-08 DIAGNOSIS — G8929 Other chronic pain: Secondary | ICD-10-CM

## 2020-11-08 DIAGNOSIS — E559 Vitamin D deficiency, unspecified: Secondary | ICD-10-CM

## 2020-11-08 DIAGNOSIS — D126 Benign neoplasm of colon, unspecified: Secondary | ICD-10-CM

## 2020-11-08 DIAGNOSIS — M25562 Pain in left knee: Secondary | ICD-10-CM

## 2020-11-08 DIAGNOSIS — R7303 Prediabetes: Secondary | ICD-10-CM

## 2020-11-20 ENCOUNTER — Encounter: Payer: Self-pay | Admitting: Family Medicine

## 2020-11-30 ENCOUNTER — Ambulatory Visit: Payer: 59 | Admitting: Rheumatology

## 2021-01-16 ENCOUNTER — Ambulatory Visit
Admission: RE | Admit: 2021-01-16 | Discharge: 2021-01-16 | Disposition: A | Discharge status: Home / Self Care | Payer: 59 | Source: Ambulatory Visit | Attending: Obstetrics & Gynecology | Admitting: Obstetrics & Gynecology

## 2021-01-16 ENCOUNTER — Other Ambulatory Visit: Payer: Self-pay

## 2021-01-16 DIAGNOSIS — Z1231 Encounter for screening mammogram for malignant neoplasm of breast: Secondary | ICD-10-CM

## 2021-01-16 IMAGING — MG MM DIGITAL SCREENING BILAT W/ TOMO AND CAD
8 series · 8 of 24 positions shown · non-contrast
Comparison: Previous exam(s).

CLINICAL DATA: Screening.

EXAM:
DIGITAL SCREENING BILATERAL MAMMOGRAM WITH TOMOSYNTHESIS AND CAD
TECHNIQUE: Bilateral screening digital craniocaudal and mediolateral oblique
mammograms were obtained. Bilateral screening digital breast
tomosynthesis was performed. The images were evaluated with
computer-aided detection.

[R MLO synth-2D]
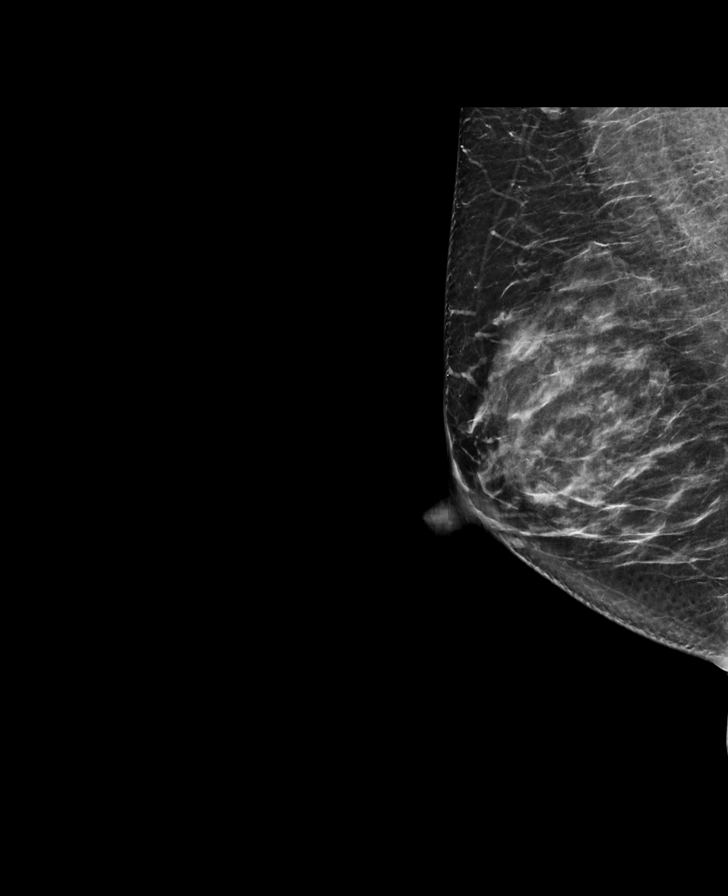

[L CC synth-2D]
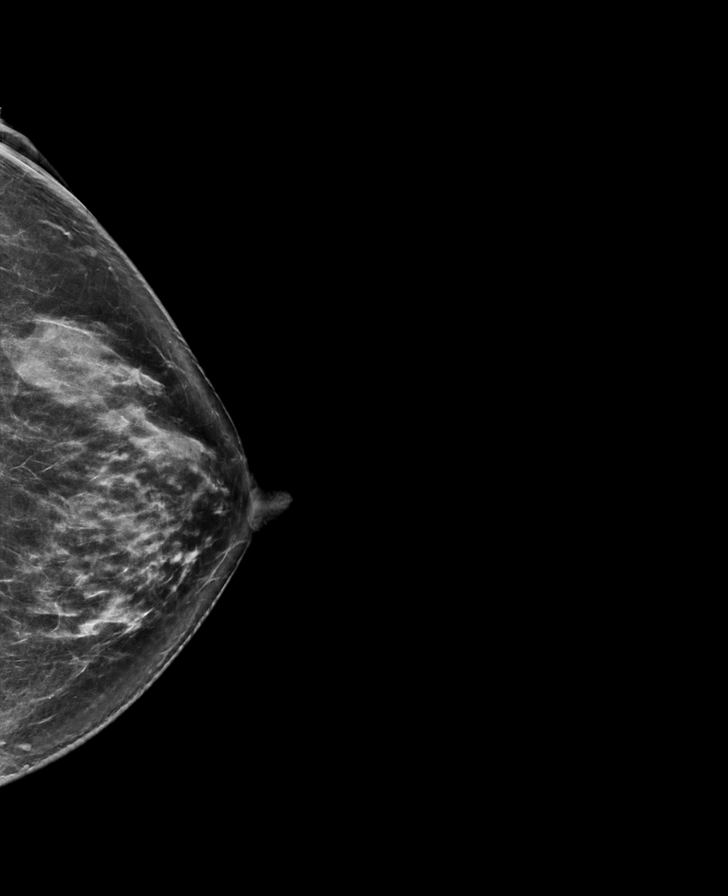

[L MLO synth-2D]
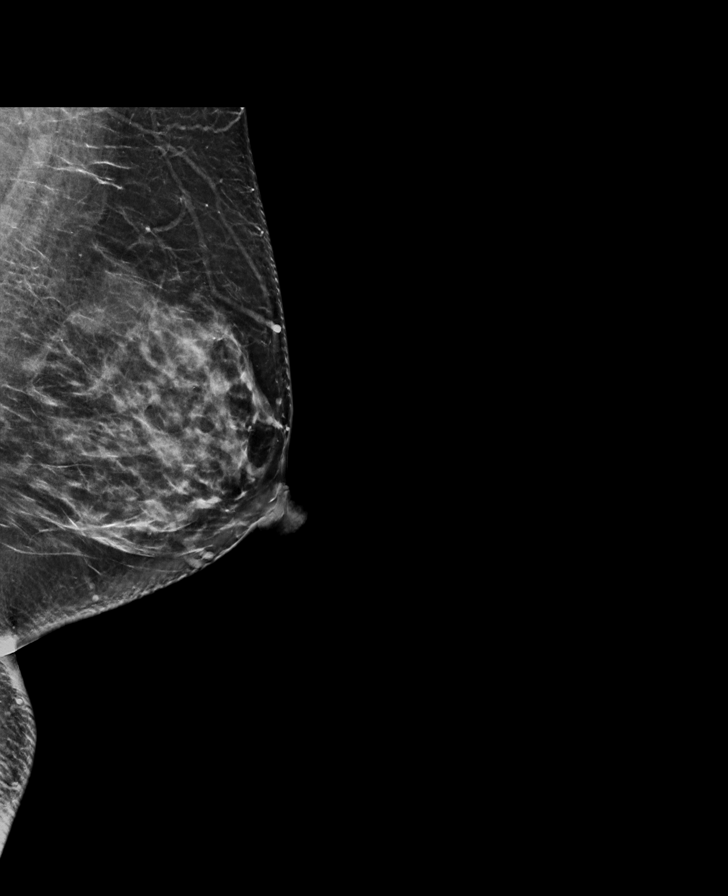

[R CC synth-2D]
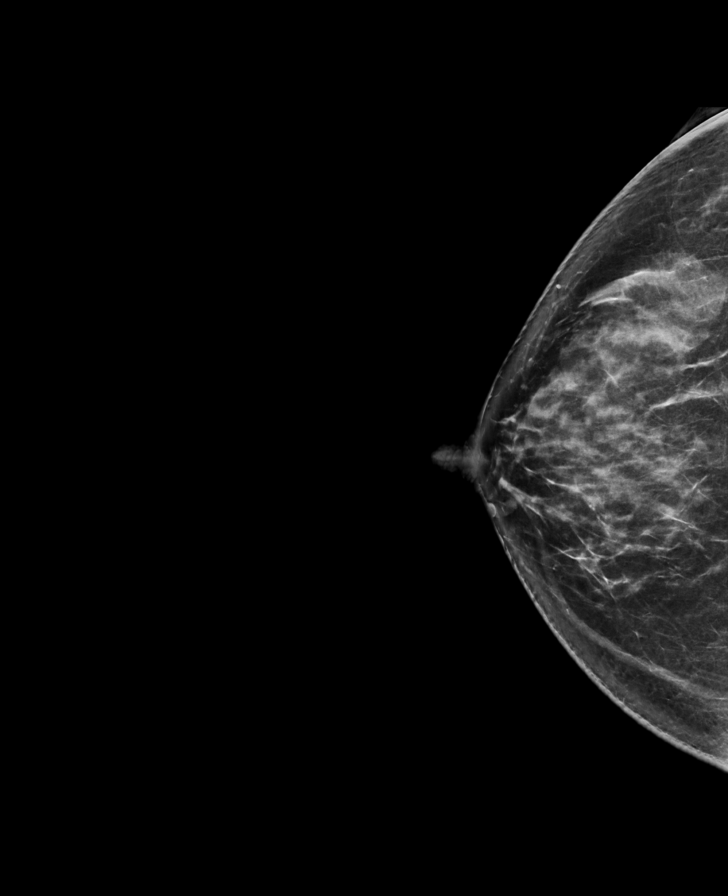

[L MLO tomo · tomo slice 37/73.0]
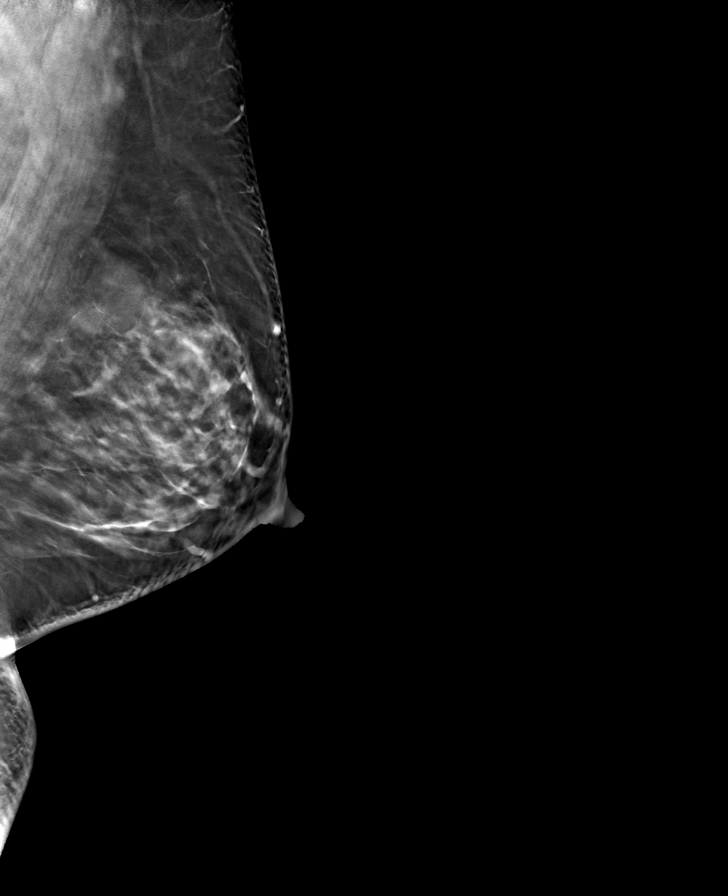

[R CC tomo · tomo slice 38/75.0]
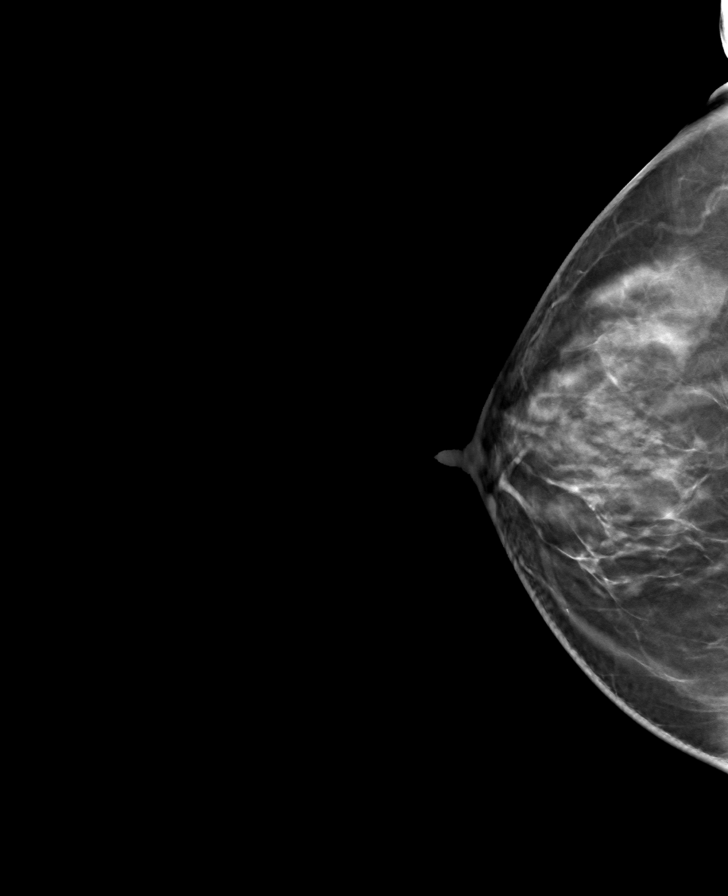

[L CC tomo · tomo slice 39/77.0]
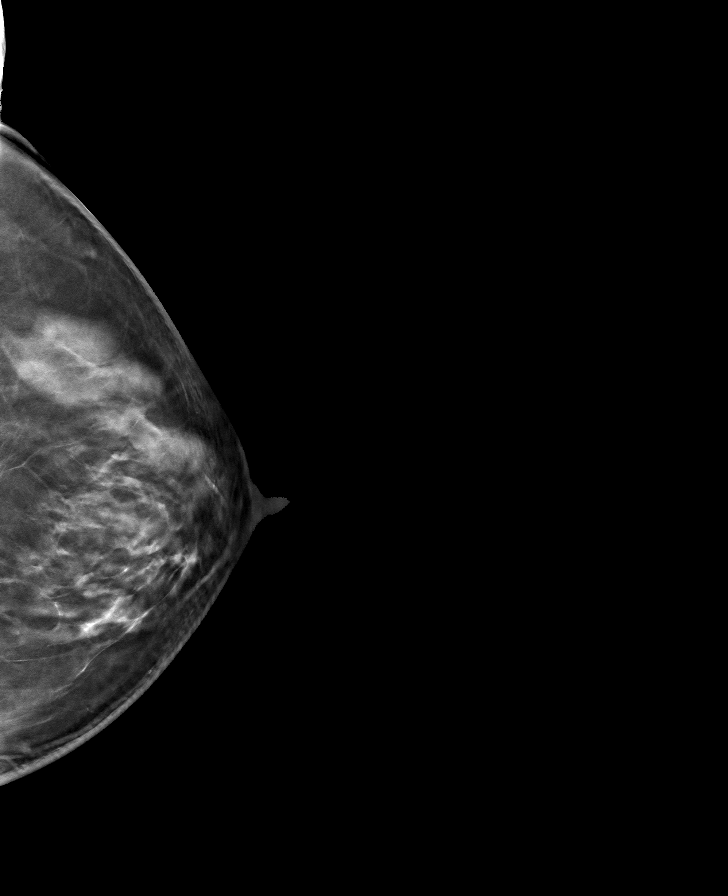

[R MLO tomo · tomo slice 37/72.0]
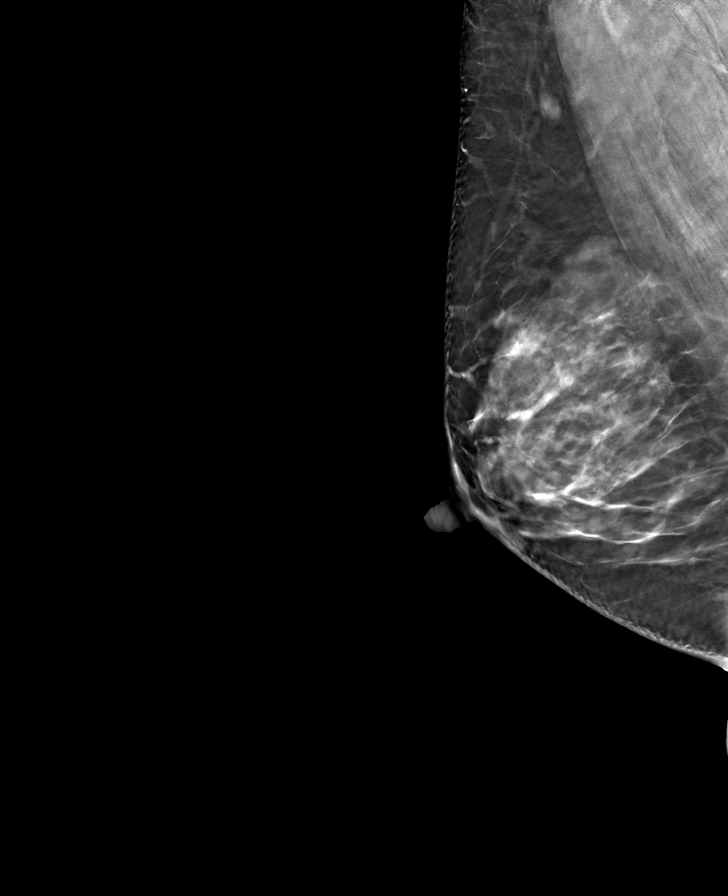

[8 of 24 positions shown; findings below may reference images not displayed]

ACR Breast Density Category c: The breast tissue is heterogeneously
dense, which may obscure small masses.
FINDINGS: There are no findings suspicious for malignancy.
IMPRESSION: No mammographic evidence of malignancy. A result letter of this
screening mammogram will be mailed directly to the patient.

RECOMMENDATION:
Screening mammogram in one year. (Code:[V2])

BI-RADS CATEGORY  1: Negative.

## 2021-01-26 ENCOUNTER — Encounter: Payer: Self-pay | Admitting: Obstetrics & Gynecology

## 2021-01-26 ENCOUNTER — Ambulatory Visit (INDEPENDENT_AMBULATORY_CARE_PROVIDER_SITE_OTHER): Payer: 59 | Admitting: Obstetrics & Gynecology

## 2021-01-26 ENCOUNTER — Other Ambulatory Visit: Payer: Self-pay

## 2021-01-26 ENCOUNTER — Other Ambulatory Visit (HOSPITAL_COMMUNITY)
Admission: RE | Admit: 2021-01-26 | Discharge: 2021-01-26 | Disposition: A | Discharge status: Home / Self Care | Payer: 59 | Source: Ambulatory Visit | Attending: Obstetrics & Gynecology | Admitting: Obstetrics & Gynecology

## 2021-01-26 VITALS — BP 128/88 | HR 70 | Ht 60.5 in | Wt 160.0 lb

## 2021-01-26 DIAGNOSIS — Z01419 Encounter for gynecological examination (general) (routine) without abnormal findings: Secondary | ICD-10-CM

## 2021-01-26 DIAGNOSIS — E6609 Other obesity due to excess calories: Secondary | ICD-10-CM

## 2021-01-26 DIAGNOSIS — Z6830 Body mass index (BMI) 30.0-30.9, adult: Secondary | ICD-10-CM

## 2021-01-26 DIAGNOSIS — Z789 Other specified health status: Secondary | ICD-10-CM

## 2021-01-26 NOTE — Progress Notes (Signed)
Joanne Jackson 11-Feb-1975 [id number]   History:    45 y.o.   G1P1L1 Married.  Daughter 68 yo (IVF).  Traveled to [place] x 5 weeks with daughter and came back with husband who was on a business trip.   RP:  Established patient presenting for annual gyn exam    HPI:  Menses normal every month.  Currently on her period.  H/O Dysmenorrhea, much better since she had her baby.  No pelvic pain.  No pain with IC.  Pap Neg 12/2019.  Pap reflex today.  Using condoms and h/o female infertility.  Urine/BMs normal.  Breasts normal. Mammo 01/2021 Neg.  BMI 30.73.  Exercising regularly.  Health labs with Fam MD.  Harriet Masson 09/2018.   Past medical history,surgical history, family history and social history were all reviewed and documented in the EPIC chart.  Gynecologic History Patient's last menstrual period was 01/24/2021 (exact date).  Obstetric History OB History  Gravida Para Term Preterm AB Living  1 1       1   SAB IAB Ectopic Multiple Live Births               # Outcome Date GA Lbr Len/2nd Weight Sex Delivery Anes PTL Lv  1 Para              ROS: A ROS was performed and pertinent positives and negatives are included in the history.  GENERAL: No fevers or chills. HEENT: No change in vision, no earache, sore throat or sinus congestion. NECK: No pain or stiffness. CARDIOVASCULAR: No chest pain or pressure. No palpitations. PULMONARY: No shortness of breath, cough or wheeze. GASTROINTESTINAL: No abdominal pain, nausea, vomiting or diarrhea, melena or bright red blood per rectum. GENITOURINARY: No urinary frequency, urgency, hesitancy or dysuria. MUSCULOSKELETAL: No joint or muscle pain, no back pain, no recent trauma. DERMATOLOGIC: No rash, no itching, no lesions. ENDOCRINE: No polyuria, polydipsia, no heat or cold intolerance. No recent change in weight. HEMATOLOGICAL: No anemia or easy bruising or bleeding. NEUROLOGIC: No headache, seizures, numbness, tingling or weakness. PSYCHIATRIC: No depression, no  loss of interest in normal activity or change in sleep pattern.     Exam:   BP 128/88    Pulse 70    Ht 5' 0.5" (1.537 m)    Wt 160 lb (72.6 kg)    LMP 01/24/2021 (Exact Date)    SpO2 98%    BMI 30.73 kg/m   Body mass index is 30.73 kg/m.  General appearance : Well developed well nourished female. No acute distress HEENT: Eyes: no retinal hemorrhage or exudates,  Neck supple, trachea midline, no carotid bruits, no thyroidmegaly Lungs: Clear to auscultation, no rhonchi or wheezes, or rib retractions  Heart: Regular rate and rhythm, no murmurs or gallops Breast:Examined in sitting and supine position were symmetrical in appearance, no palpable masses or tenderness,  no skin retraction, no nipple inversion, no nipple discharge, no skin discoloration, no axillary or supraclavicular lymphadenopathy Abdomen: no palpable masses or tenderness, no rebound or guarding Extremities: no edema or skin discoloration or tenderness  Pelvic: Vulva: Normal             Vagina: No gross lesions or discharge  Cervix: No gross lesions or discharge.  Menstrual blood present, removed with a sponge stix.  Pap reflex done.  Uterus  AV, normal size, shape and consistency, non-tender and mobile  Adnexa  Without masses or tenderness  Anus: Normal   Assessment/Plan:  45 y.o. female for annual  exam   1. Encounter for routine gynecological examination with Papanicolaou smear of cervix  Menses normal every month.  Currently on her period.  H/O Dysmenorrhea, much better since she had her baby.  No pelvic pain.  No pain with IC.  Pap Neg 12/2019.  Pap reflex today.  Using condoms and h/o female infertility.  Urine/BMs normal.  Breasts normal. Mammo 01/2021 Neg.  BMI 30.73.  Exercising regularly.  Health labs with Fam MD.  Harriet Masson 09/2018. - Cytology - PAP( [facility])  2. Use of condoms for contraception  3. Class 1 obesity due to excess calories without serious comorbidity with body mass index (BMI) of 30.0 to 30.9 in  adult Lower calorie/carb diet.  Continue with fitness.  Other orders - B Complex Vitamins (VITAMIN B COMPLEX PO); Take by mouth.   Princess Bruins MD, 2:59 PM 01/26/2021

## 2021-02-01 LAB — CYTOLOGY - PAP: Diagnosis: NEGATIVE

## 2021-02-09 ENCOUNTER — Encounter: Payer: Self-pay | Admitting: Family Medicine

## 2021-02-09 ENCOUNTER — Other Ambulatory Visit: Payer: Self-pay

## 2021-02-09 ENCOUNTER — Ambulatory Visit: Payer: 59 | Admitting: Family Medicine

## 2021-02-09 VITALS — BP 134/98 | HR 63 | Temp 98.3°F | Ht 61.0 in | Wt 160.8 lb

## 2021-02-09 DIAGNOSIS — M3501 Sicca syndrome with keratoconjunctivitis: Secondary | ICD-10-CM | POA: Diagnosis not present

## 2021-02-09 DIAGNOSIS — R7303 Prediabetes: Secondary | ICD-10-CM | POA: Diagnosis not present

## 2021-02-09 DIAGNOSIS — I10 Essential (primary) hypertension: Secondary | ICD-10-CM

## 2021-02-09 LAB — POCT GLYCOSYLATED HEMOGLOBIN (HGB A1C): Hemoglobin A1C: 5.9 % — AB (ref 4.0–5.6)

## 2021-02-09 MED ORDER — LISINOPRIL 40 MG PO TABS: 20.0000 mg | ORAL_TABLET | Freq: Every day | ORAL | 1 refills | Status: DC

## 2021-02-09 MED ORDER — AMLODIPINE BESYLATE 2.5 MG PO TABS: 2.5000 mg | ORAL_TABLET | Freq: Every day | ORAL | 3 refills | Status: DC

## 2021-02-09 NOTE — Patient Instructions (Signed)
Please return in 6 months to recheck blood pressure.  You may continue checking your numbers at home and let me know how they are doing on the new combination. I can adjust the dose of each medication if needed.   Today you were given your Prevnar 20 vaccination.  This helps protect your from pneumococcal pneumonias.   If you have any questions or concerns, please don't hesitate to send me a message via MyChart or call the office at [phone]. Thank you for visiting with Joanne Jackson today! It's our pleasure caring for you.

## 2021-02-09 NOTE — Progress Notes (Signed)
Subjective  CC:  Chief Complaint  Patient presents with   Hypertension    Blood pressure has been good at home.    Diabetes    HPI: Joanne Jackson is a 46 y.o. female who presents to the office today to address the problems listed above in the chief complaint. Hypertension f/u: Control is fair . Pt reports she is doing well. taking medications as instructed, no TIAs, no chest pain on exertion, no dyspnea on exertion, no swelling of ankles. We had started microzide but systolics became too low: see telephone note with bp log. Now on lisinopril 40 again and systolics look good 097-353 but diastolics remain high. Narrow pulse pressure. She denies adverse effects from his BP medications. Compliance with medication is good.  H/o prediabetes: weight is stable. Follows healthy diet. No sxs of hyperglycemia.  Sjogrens: autoimmune: qualifies for prevnar 20. Education and counseling given.   Assessment  1. Essential hypertension   2. Prediabetes   3. 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[place])      Plan   Hypertension f/u: BP control is fairly well controlled. However, diastolics remain high. Will decrease lisinopril to 20 daily and add low dose amlodipine to see if we can get diastolics down. She will keep a log and message me results. Bps are equal in both arms.  Prediabetes f/u: doing well with near normal a1c. Continue diet and monitoring.  Sjogren's and prevnar 20 eligible. We are out of stock today: will update in near future.   Education regarding management of these chronic disease states was given. Management strategies discussed on successive visits include dietary and exercise recommendations, goals of achieving and maintaining IBW, and lifestyle modifications aiming for adequate sleep and minimizing stressors.   Follow up: 6 mo for htn recheck.   Orders Placed This Encounter  Procedures   POCT HgB A1C   Meds ordered this encounter  Medications   lisinopril  (ZESTRIL) 40 MG tablet    Sig: Take 0.5 tablets (20 mg total) by mouth daily.    Dispense:  90 tablet    Refill:  1   amLODipine (NORVASC) 2.5 MG tablet    Sig: Take 1 tablet (2.5 mg total) by mouth daily.    Dispense:  90 tablet    Refill:  3      BP Readings from Last 3 Encounters:  02/09/21 (!) 134/98  01/26/21 128/88  11/08/20 108/76   Wt Readings from Last 3 Encounters:  02/09/21 160 lb 12.8 oz (72.9 kg)  01/26/21 160 lb (72.6 kg)  11/08/20 160 lb 3.2 oz (72.7 kg)    Lab Results  Component Value Date   CHOL 167 10/25/2020   CHOL 158 12/02/2019   CHOL 150 05/21/2019   Lab Results  Component Value Date   HDL 47.70 10/25/2020   HDL 50 12/02/2019   HDL 37.40 (L) 05/21/2019   Lab Results  Component Value Date   LDLCALC 82 10/25/2020   LDLCALC 85 12/02/2019   Lab Results  Component Value Date   TRIG 187.0 (H) 10/25/2020   TRIG 131 12/02/2019   TRIG 212.0 (H) 05/21/2019   Lab Results  Component Value Date   CHOLHDL 3 10/25/2020   CHOLHDL 3.2 12/02/2019   CHOLHDL 4 05/21/2019   Lab Results  Component Value Date   LDLDIRECT 86.0 05/21/2019   Lab Results  Component Value Date   CREATININE 0.54 10/25/2020   BUN 9 10/25/2020   NA 137 10/25/2020   K 4.0  10/25/2020   CL 101 10/25/2020   CO2 25 10/25/2020    The 10-year ASCVD risk score (Arnett DK, et al., 2019) is: 1%   Values used to calculate the score:     Age: 39 years     Sex: Female     Is Non-Hispanic African American: No     Diabetic: No     Tobacco smoker: No     Systolic Blood Pressure: 301 mmHg     Is BP treated: Yes     HDL Cholesterol: 47.7 mg/dL     Total Cholesterol: 167 mg/dL  I reviewed the patients updated PMH, FH, and SocHx.    Patient Active Problem List   Diagnosis Date Noted   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[place]) 10/25/2020    Priority: High   Tubular adenoma of colon 10/24/2018    Priority: High   Essential hypertension 12/26/2017    Priority: High    Iron deficiency anemia 12/03/2019    Priority: Medium    Prediabetes 12/26/2017    Priority: Medium    History of gestational diabetes 10/25/2020    Priority: Low   Intramural leiomyoma of uterus 07/29/2015    Priority: Low    Allergies: Patient has no known allergies.  Social History: Patient  reports that she has never smoked. She has never used smokeless tobacco. She reports that she does not currently use alcohol. She reports that she does not use drugs.  Current Meds  Medication Sig   amLODipine (NORVASC) 2.5 MG tablet Take 1 tablet (2.5 mg total) by mouth daily.   B Complex Vitamins (VITAMIN B COMPLEX PO) Take by mouth.   Cholecalciferol (VITAMIN D3) 50 MCG (2000 UT) TABS Take 2,000 Units by mouth daily as needed.    ferrous sulfate 324 (65 Fe) MG TBEC TAKE 1 TABLET BY MOUTH EVERY DAY   RESTASIS 0.05 % ophthalmic emulsion    [DISCONTINUED] lisinopril (ZESTRIL) 40 MG tablet TAKE 1 TABLET BY MOUTH EVERY DAY    Review of Systems: Cardiovascular: negative for chest pain, palpitations, leg swelling, orthopnea Respiratory: negative for SOB, wheezing or persistent cough Gastrointestinal: negative for abdominal pain Genitourinary: negative for dysuria or gross hematuria  Objective  Vitals: BP (!) 134/98    Pulse 63    Temp 98.3 F (36.8 C) (Temporal)    Ht 5\' 1"  (1.549 m)    Wt 160 lb 12.8 oz (72.9 kg)    LMP 01/24/2021 (Exact Date)    SpO2 98%    BMI 30.38 kg/m  General: no acute distress  Psych:  Alert and oriented, normal mood and affect HEENT:  Normocephalic, atraumatic, supple neck  Cardiovascular:  RRR without murmur. no edema Respiratory:  Good breath sounds bilaterally, CTAB with normal respiratory effort Skin:  Warm, no rashes Neurologic:   Mental status is normal Commons side effects, risks, benefits, and alternatives for medications and treatment plan prescribed today were discussed, and the patient expressed understanding of the given instructions. Patient is  instructed to call or message via MyChart if he/she has any questions or concerns regarding our treatment plan. No barriers to understanding were identified. We discussed Red Flag symptoms and signs in detail. Patient expressed understanding regarding what to do in case of urgent or emergency type symptoms.  Medication list was reconciled, printed and provided to the patient in AVS. Patient instructions and summary information was reviewed with the patient as documented in the AVS. This note was prepared with assistance of Dragon voice recognition  software. Occasional wrong-word or sound-a-like substitutions may have occurred due to the inherent limitations of voice recognition software  This visit occurred during the SARS-CoV-2 public health emergency.  Safety protocols were in place, including screening questions prior to the visit, additional usage of staff PPE, and extensive cleaning of exam room while observing appropriate contact time as indicated for disinfecting solutions.

## 2021-02-14 ENCOUNTER — Encounter: Payer: Self-pay | Admitting: Family Medicine

## 2021-02-16 ENCOUNTER — Telehealth: Payer: Self-pay | Admitting: Family Medicine

## 2021-02-16 ENCOUNTER — Other Ambulatory Visit: Payer: Self-pay

## 2021-02-16 ENCOUNTER — Other Ambulatory Visit: Payer: Self-pay | Admitting: Physician Assistant

## 2021-02-16 MED ORDER — LISINOPRIL 40 MG PO TABS: 20.0000 mg | ORAL_TABLET | Freq: Every day | ORAL | 1 refills | Status: DC

## 2021-02-16 NOTE — Telephone Encounter (Signed)
Spoke with patient, gave a verbal understanding. Pharmacy is working on medication

## 2021-02-16 NOTE — Telephone Encounter (Signed)
Pt states a refill for Lisinopril 40mg  was denied. She needs an approval

## 2021-02-19 ENCOUNTER — Encounter: Payer: Self-pay | Admitting: Family Medicine

## 2021-02-27 MED ORDER — LISINOPRIL 40 MG PO TABS: 40.0000 mg | ORAL_TABLET | Freq: Every day | ORAL | 3 refills | Status: DC

## 2021-03-02 ENCOUNTER — Other Ambulatory Visit: Payer: Self-pay

## 2021-03-02 ENCOUNTER — Ambulatory Visit: Payer: 59 | Admitting: Family Medicine

## 2021-03-02 VITALS — BP 120/84 | HR 76 | Temp 98.1°F

## 2021-03-02 DIAGNOSIS — R300 Dysuria: Secondary | ICD-10-CM

## 2021-03-02 DIAGNOSIS — I10 Essential (primary) hypertension: Secondary | ICD-10-CM | POA: Diagnosis not present

## 2021-03-02 LAB — POCT URINALYSIS DIPSTICK
Bilirubin, UA: NEGATIVE
Blood, UA: NEGATIVE
Glucose, UA: NEGATIVE
Ketones, UA: NEGATIVE
Leukocytes, UA: NEGATIVE
Nitrite, UA: NEGATIVE
Protein, UA: NEGATIVE
Spec Grav, UA: 1.025 (ref 1.010–1.025)
Urobilinogen, UA: 0.2 E.U./dL
pH, UA: 5 (ref 5.0–8.0)

## 2021-03-02 MED ORDER — NITROFURANTOIN MONOHYD MACRO 100 MG PO CAPS: 100.0000 mg | ORAL_CAPSULE | Freq: Two times a day (BID) | ORAL | 0 refills | Status: DC

## 2021-03-02 NOTE — Patient Instructions (Signed)
It was nice to see you!  You have a urinary tract infection. Please start the antibiotic.  We will check a urine culture to make sure you do not have a resistant bacteria. We will call you if we need to change your medications.   Please make sure you are drinking plenty of fluids over the next few days.  If your symptoms do not improve over the next 5-7 days, or if they worsen, please let us know. Please also let us know if you have worsening back pain, fevers, chills, or body aches.   Take care, Dr Jerline Pain

## 2021-03-02 NOTE — Progress Notes (Signed)
° °  Joanne Jackson is a 46 y.o. female who presents today for an office visit.  Assessment/Plan:  New/Acute Problems: UTI Point-of-care UA negative however symptoms consistent with prior UTIs.  Empirically start Macrobid 100 mg twice daily while we await culture results.  No signs of systemic illness.  Encourage good hydration.  Discussed reasons to return to care.  Chronic Problems Addressed Today: Hypertension At goal today on lisinopril 40 mg daily.    Subjective:  HPI:  She is here with concern for possible UTI. This started about 2 days ago. Her symptoms included dysuria. This has improved. She has tried vitamin c and tried to drink lots of water. She did felt little dizzy when this started. Had some nausea last night. Denies fever, chills or vomiting. Had UTI in the past about 1-2 times. Denies urgency or hematuria.  Symptoms consistent with prior UTI.        Objective:  Physical Exam: BP 120/84    Pulse 76    Temp 98.1 F (36.7 C)    SpO2 98%   Gen: No acute distress, resting comfortably CV: Regular rate and rhythm with no murmurs appreciated Pulm: Normal work of breathing, clear to auscultation bilaterally with no crackles, wheezes, or rhonchi Neuro: Grossly normal, moves all extremities Psych: Normal affect and thought content       I,Savera Zaman,acting as a scribe for Dimas Chyle, MD.,have documented all relevant documentation on the behalf of Dimas Chyle, MD,as directed by  Dimas Chyle, MD while in the presence of Dimas Chyle, MD.   I, Dimas Chyle, MD, have reviewed all documentation for this visit. The documentation on 03/02/21 for the exam, diagnosis, procedures, and orders are all accurate and complete.  Algis Greenhouse. Jerline Pain, MD 03/02/2021 2:58 PM

## 2021-03-03 LAB — URINE CULTURE
MICRO NUMBER:: 12924233
Result:: NO GROWTH
SPECIMEN QUALITY:: ADEQUATE

## 2021-03-06 NOTE — Progress Notes (Signed)
Please inform patient of the following:  Her urine culture is negative. It is ok for her to stop her antibiotic. Would like for her to let us know if her symptoms are not improving.  Algis Greenhouse. Jerline Pain, MD 03/06/2021 8:08 AM

## 2021-04-25 NOTE — Progress Notes (Deleted)
? ?Office Visit Note ? ?Patient: Joanne Jackson             ?Date of Birth: 1975/03/25           ?MRN: [id number]             ?PCP: Leamon Arnt, MD ?Referring: Leamon Arnt, MD ?Visit Date: 05/09/2021 ?Occupation: '@GUAROCC'$ @ ? ?Subjective:  ?No chief complaint on file. ? ? ?History of Present Illness: [name] is a 46 y.o. female ***  ? ?Activities of Daily Living:  ?Patient reports morning stiffness for *** {minute/hour:19697}.   ?Patient {ACTIONS;DENIES/REPORTS:21021675::"Denies"} nocturnal pain.  ?Difficulty dressing/grooming: {ACTIONS;DENIES/REPORTS:21021675::"Denies"} ?Difficulty climbing stairs: {ACTIONS;DENIES/REPORTS:21021675::"Denies"} ?Difficulty getting out of chair: {ACTIONS;DENIES/REPORTS:21021675::"Denies"} ?Difficulty using hands for taps, buttons, cutlery, and/or writing: {ACTIONS;DENIES/REPORTS:21021675::"Denies"} ? ?No Rheumatology ROS completed.  ? ?PMFS History:  ?Patient Active Problem List  ? Diagnosis Date Noted  ?? 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 History of gestational diabetes 10/25/2020  ?? Iron deficiency anemia 12/03/2019  ?? Tubular adenoma of colon 10/24/2018  ?? Essential hypertension 12/26/2017  ?? Prediabetes 12/26/2017  ?? Intramural leiomyoma of uterus 07/29/2015  ?  ?Past Medical History:  ?Diagnosis Date  ?? Hypertension   ?? Pre-diabetes   ?? 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  ?Family History  ?Problem Relation Age of Onset  ?? Thyroid disease Mother   ?? Heart attack Father 17  ?? Hypertension Father   ?? Healthy Brother   ?? Healthy Daughter   ?? Healthy Paternal Grandfather   ?? Colon cancer Neg Hx   ?? Cancer Neg Hx   ?? Stroke Neg Hx   ? ?Past Surgical History:  ?Procedure Laterality Date  ?? CESAREAN SECTION    ?? DILATION AND CURETTAGE, DIAGNOSTIC / THERAPEUTIC    ? ?Social History  ? ?Social History Narrative  ?? Not on file  ? ?Immunization History  ?Administered Date(s) Administered  ??  Influenza,inj,Quad PF,6+ Mos 11/11/2015, 10/24/2018, 12/02/2019, 10/25/2020  ?? Moderna Sars-Covid-2 Vaccination 05/29/2019, 06/19/2019  ?? PFIZER(Purple Top)SARS-COV-2 Vaccination 01/19/2020  ?? Tdap 10/14/2015  ?  ? ?Objective: ?Vital Signs: There were no vitals taken for this visit.  ? ?Physical Exam  ? ?Musculoskeletal Exam: *** ? ?CDAI Exam: ?CDAI Score: -- ?Patient Global: --; Provider Global: -- ?Swollen: --; Tender: -- ?Joint Exam 05/09/2021  ? ?No joint exam has been documented for this visit  ? ?There is currently no information documented on the homunculus. Go to the Rheumatology activity and complete the homunculus joint exam. ? ?Investigation: ?No additional findings. ? ?Imaging: ?No results found. ? ?Recent Labs: ?Lab Results  ?Component Value Date  ? WBC 4.1 10/25/2020  ? HGB 13.5 10/25/2020  ? PLT 242.0 10/25/2020  ? NA 137 10/25/2020  ? K 4.0 10/25/2020  ? CL 101 10/25/2020  ? CO2 25 10/25/2020  ? GLUCOSE 92 10/25/2020  ? BUN 9 10/25/2020  ? CREATININE 0.54 10/25/2020  ? BILITOT 0.6 10/25/2020  ? ALKPHOS 49 10/25/2020  ? AST 21 10/25/2020  ? ALT 22 10/25/2020  ? PROT 8.0 10/25/2020  ? ALBUMIN 4.3 10/25/2020  ? CALCIUM 9.5 10/25/2020  ? GFRAA 138 11/24/2019  ? ? ?Speciality Comments: No specialty comments available. ? ?Procedures:  ?No procedures performed ?Allergies: Patient has no known allergies.  ? ?Assessment / Plan:     ?Visit Diagnoses: No diagnosis found. ? ?Orders: ?No orders of the defined types were placed in this encounter. ? ?No orders of the defined types were placed in this  encounter. ? ? ?Face-to-face time spent with patient was *** minutes. Greater than 50% of time was spent in counseling and coordination of care. ? ?Follow-Up Instructions: No follow-ups on file. ? ? ?Earnestine Mealing, CMA ? ?Note - This record has been created using [employer].  ?Chart creation errors have been sought, but may not always  ?have been located. Such creation errors do not reflect on  ?the standard  of medical care.  ?

## 2021-04-27 ENCOUNTER — Encounter: Payer: Self-pay | Admitting: Family

## 2021-04-27 ENCOUNTER — Ambulatory Visit: Payer: 59 | Admitting: Family

## 2021-04-27 VITALS — BP 118/80 | HR 72 | Temp 98.2°F | Ht 61.0 in | Wt 235.6 lb

## 2021-04-27 DIAGNOSIS — R112 Nausea with vomiting, unspecified: Secondary | ICD-10-CM | POA: Diagnosis not present

## 2021-04-27 DIAGNOSIS — R519 Headache, unspecified: Secondary | ICD-10-CM

## 2021-04-27 MED ORDER — ONDANSETRON HCL 4 MG PO TABS: 4.0000 mg | ORAL_TABLET | Freq: Three times a day (TID) | ORAL | 0 refills | Status: DC | PRN

## 2021-04-27 NOTE — Patient Instructions (Signed)
It was very nice to see you today! ? ?For your headache, you can take up to '600mg'$  of Ibuprofen 3 times per day after eating a small snack or meal.  ?I have sent generic Zofran to your pharmacy to help with your nausea. ? ?Let us know if you have any continued dizziness, as this may be related to vertigo and require a different medication. ? ?Get your vision checked as well as a good eye exam by an optometrist or ophthalmologist, and be sure your laptop has the blue light filter. ?  ? ? ?PLEASE NOTE: ? ?If you had any lab tests please let us know if you have not heard back within a few days. You may see your results on MyChart before we have a chance to review them but we will give you a call once they are reviewed by [place]. If we ordered any referrals today, please let us know if you have not heard from their office within the next week.  ? ?Please try these tips to maintain a healthy lifestyle: ? ?Eat most of your calories during the day when you are active. Eliminate processed foods including packaged sweets (pies, cakes, cookies), reduce intake of potatoes, white bread, white pasta, and white rice. Look for whole grain options, oat flour or almond flour. ? ?Each meal should contain half fruits/vegetables, one quarter protein, and one quarter carbs (no bigger than a computer mouse). ? ?Cut down on sweet beverages. This includes juice, soda, and sweet tea. Also watch fruit intake, though this is a healthier sweet option, it still contains natural sugar! Limit to 3 servings daily. ? ?Drink at least 1 glass of water with each meal and aim for at least 8 glasses per day ? ?Exercise at least 150 minutes every week.  ? ?

## 2021-04-27 NOTE — Progress Notes (Signed)
? ?Subjective:  ? ? ? Patient ID: Joanne Jackson, female    DOB: 1975/12/06, 46 y.o.   MRN: [id number] ? ?Chief Complaint  ?Patient presents with  ? Headache  ?  Pt c/o headache in the front of her head and c/o nausea and throwing up this morning. Taking Ibuprofen and it helped some.  ? ?HPI: ? ? ?There are no preventive care reminders to display for this patient. ? ?Past Medical History:  ?Diagnosis Date  ? Hypertension   ? Pre-diabetes   ? 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[place])   ? ? ?Past Surgical History:  ?Procedure Laterality Date  ? CESAREAN SECTION    ? DILATION AND CURETTAGE, DIAGNOSTIC / THERAPEUTIC    ? ? ?Outpatient Medications Prior to Visit  ?Medication Sig Dispense Refill  ? B Complex Vitamins (VITAMIN B COMPLEX PO) Take by mouth.    ? Cholecalciferol (VITAMIN D3) 50 MCG (2000 UT) TABS Take 2,000 Units by mouth daily as needed.     ? ferrous sulfate 324 (65 Fe) MG TBEC TAKE 1 TABLET BY MOUTH EVERY DAY 90 tablet 1  ? lisinopril (ZESTRIL) 40 MG tablet Take 1 tablet (40 mg total) by mouth daily. 90 tablet 3  ? RESTASIS 0.05 % ophthalmic emulsion     ? nitrofurantoin, macrocrystal-monohydrate, (MACROBID) 100 MG capsule Take 1 capsule (100 mg total) by mouth 2 (two) times daily. 14 capsule 0  ? ?No facility-administered medications prior to visit.  ? ? ?No Known Allergies ? ? ?   ?Objective:  ?  ?Physical Exam ?Vitals and nursing note reviewed.  ?Constitutional:   ?   Appearance: Normal appearance.  ?Eyes:  ?   Extraocular Movements: Extraocular movements intact.  ?   Conjunctiva/sclera: Conjunctivae normal.  ?Cardiovascular:  ?   Rate and Rhythm: Normal rate and regular rhythm.  ?Pulmonary:  ?   Effort: Pulmonary effort is normal.  ?   Breath sounds: Normal breath sounds.  ?Musculoskeletal:     ?   General: Normal range of motion.  ?Skin: ?   General: Skin is warm and dry.  ?Neurological:  ?   Mental Status: She is alert.  ?Psychiatric:     ?   Mood and Affect: Mood normal.     ?    Behavior: Behavior normal.  ? ? ?BP 118/80 (BP Location: Left Arm, Patient Position: Sitting, Cuff Size: Large)   Pulse 72   Temp 98.2 ?F (36.8 ?C) (Temporal)   Ht '5\' 1"'$  (1.549 m)   Wt 235 lb 9.6 oz (106.9 kg)   LMP 04/12/2021 (Approximate)   SpO2 94%   BMI 44.52 kg/m?  ?Wt Readings from Last 3 Encounters:  ?04/27/21 235 lb 9.6 oz (106.9 kg)  ?02/09/21 160 lb 12.8 oz (72.9 kg)  ?01/26/21 160 lb (72.6 kg)  ? ? ?   ?Assessment & Plan:  ? ?Problem List Items Addressed This Visit   ?None ?Visit Diagnoses   ? ? New onset headache    -  Primary ?pt reports HA starting after she woke up this am w/vision changes, N&V x1. Denies ever happening before, pain was 6/10, now 5/10. 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 Advised to take '600mg'$  Ibuprofen after eating 3 x/day prn, if having frequent HA, call back to office, reviewed other HA triggers to avoid. ?  ?  Nausea and vomiting, unspecified vomiting type      ? pt reports starting after HA this am, one episode, feels a little better now. sending generic Zofran, advised on use & SE. ? ?Relevant Medications  ? ondansetron (ZOFRAN) 4 MG tablet  ? ?  ? ?Meds ordered this encounter  ?Medications  ? ondansetron (ZOFRAN) 4 MG tablet  ?  Sig: Take 1 tablet (4 mg total) by mouth every 8 (eight) hours as needed for nausea or vomiting.  ?  Dispense:  20 tablet  ?  Refill:  0  ?  Order Specific Question:   Supervising Provider  ?  Answer:   ANDY, CAMILLE L [[id number]]  ? ? ?Jeanie Sewer, NP ? ?

## 2021-04-28 ENCOUNTER — Emergency Department (HOSPITAL_BASED_OUTPATIENT_CLINIC_OR_DEPARTMENT_OTHER): Payer: 59

## 2021-04-28 ENCOUNTER — Observation Stay (HOSPITAL_BASED_OUTPATIENT_CLINIC_OR_DEPARTMENT_OTHER)
Admission: EM | Admit: 2021-04-28 | Discharge: 2021-04-30 | Disposition: A | Discharge status: Home / Self Care | Payer: 59 | Attending: Family Medicine | Admitting: Family Medicine

## 2021-04-28 ENCOUNTER — Encounter (HOSPITAL_BASED_OUTPATIENT_CLINIC_OR_DEPARTMENT_OTHER): Payer: Self-pay

## 2021-04-28 ENCOUNTER — Other Ambulatory Visit: Payer: Self-pay

## 2021-04-28 DIAGNOSIS — R2990 Unspecified symptoms and signs involving the nervous system: Secondary | ICD-10-CM | POA: Diagnosis present

## 2021-04-28 DIAGNOSIS — I10 Essential (primary) hypertension: Secondary | ICD-10-CM | POA: Insufficient documentation

## 2021-04-28 DIAGNOSIS — Z79899 Other long term (current) drug therapy: Secondary | ICD-10-CM | POA: Insufficient documentation

## 2021-04-28 DIAGNOSIS — E1169 Type 2 diabetes mellitus with other specified complication: Secondary | ICD-10-CM | POA: Diagnosis not present

## 2021-04-28 DIAGNOSIS — C719 Malignant neoplasm of brain, unspecified: Secondary | ICD-10-CM

## 2021-04-28 DIAGNOSIS — G459 Transient cerebral ischemic attack, unspecified: Principal | ICD-10-CM | POA: Insufficient documentation

## 2021-04-28 DIAGNOSIS — M3501 Sicca syndrome with keratoconjunctivitis: Secondary | ICD-10-CM | POA: Diagnosis present

## 2021-04-28 DIAGNOSIS — D496 Neoplasm of unspecified behavior of brain: Secondary | ICD-10-CM

## 2021-04-28 DIAGNOSIS — R4701 Aphasia: Secondary | ICD-10-CM | POA: Diagnosis present

## 2021-04-28 DIAGNOSIS — R7303 Prediabetes: Secondary | ICD-10-CM | POA: Diagnosis present

## 2021-04-28 DIAGNOSIS — E785 Hyperlipidemia, unspecified: Secondary | ICD-10-CM | POA: Diagnosis not present

## 2021-04-28 LAB — CBC
HCT: 40.3 % (ref 36.0–46.0)
Hemoglobin: 13.2 g/dL (ref 12.0–15.0)
MCH: 28.4 pg (ref 26.0–34.0)
MCHC: 32.8 g/dL (ref 30.0–36.0)
MCV: 86.7 fL (ref 80.0–100.0)
Platelets: 223 10*3/uL (ref 150–400)
RBC: 4.65 MIL/uL (ref 3.87–5.11)
RDW: 12.5 % (ref 11.5–15.5)
WBC: 6 10*3/uL (ref 4.0–10.5)
nRBC: 0 % (ref 0.0–0.2)

## 2021-04-28 LAB — COMPREHENSIVE METABOLIC PANEL
ALT: 20 U/L (ref 0–44)
AST: 18 U/L (ref 15–41)
Albumin: 4.5 g/dL (ref 3.5–5.0)
Alkaline Phosphatase: 40 U/L (ref 38–126)
Anion gap: 9 (ref 5–15)
BUN: 19 mg/dL (ref 6–20)
CO2: 24 mmol/L (ref 22–32)
Calcium: 9.9 mg/dL (ref 8.9–10.3)
Chloride: 102 mmol/L (ref 98–111)
Creatinine, Ser: 0.86 mg/dL (ref 0.44–1.00)
GFR, Estimated: >60 mL/min (ref >60)
Glucose, Bld: 135 mg/dL — ABNORMAL HIGH (ref 70–99)
Potassium: 4.1 mmol/L (ref 3.5–5.1)
Sodium: 135 mmol/L (ref 135–145)
Total Bilirubin: 0.9 mg/dL (ref 0.3–1.2)
Total Protein: 8.1 g/dL (ref 6.5–8.1)

## 2021-04-28 LAB — CBG MONITORING, ED: Glucose-Capillary: 133 mg/dL — ABNORMAL HIGH (ref 70–99)

## 2021-04-28 LAB — PROTIME-INR
INR: 0.9 (ref 0.8–1.2)
Prothrombin Time: 12.5 seconds (ref 11.4–15.2)

## 2021-04-28 LAB — DIFFERENTIAL
Abs Immature Granulocytes: 0.01 10*3/uL (ref 0.00–0.07)
Basophils Absolute: 0 10*3/uL (ref 0.0–0.1)
Basophils Relative: 0 %
Eosinophils Absolute: 0.1 10*3/uL (ref 0.0–0.5)
Eosinophils Relative: 1 %
Immature Granulocytes: 0 %
Lymphocytes Relative: 36 %
Lymphs Abs: 2.2 10*3/uL (ref 0.7–4.0)
Monocytes Absolute: 0.5 10*3/uL (ref 0.1–1.0)
Monocytes Relative: 8 %
Neutro Abs: 3.3 10*3/uL (ref 1.7–7.7)
Neutrophils Relative %: 55 %

## 2021-04-28 LAB — APTT: aPTT: 30 seconds (ref 24–36)

## 2021-04-28 LAB — PREGNANCY, URINE: Preg Test, Ur: NEGATIVE

## 2021-04-28 IMAGING — CT CT HEAD W/O CM
4 series · 16 of 47 positions shown, 18 images · non-contrast
Comparison: None.

CLINICAL DATA: Headache.



[Series 2: head wo · axial · 0.44mm/px · z∈[-20,+95]mm · 7 of 31 slices shown, 9 images]
[im 4/31  brain]
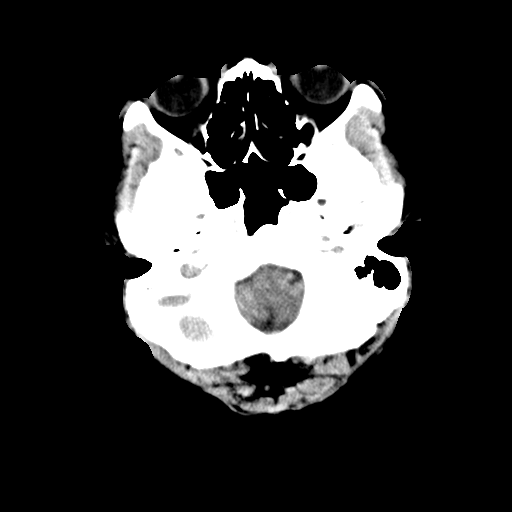
[im 4/31  bone]
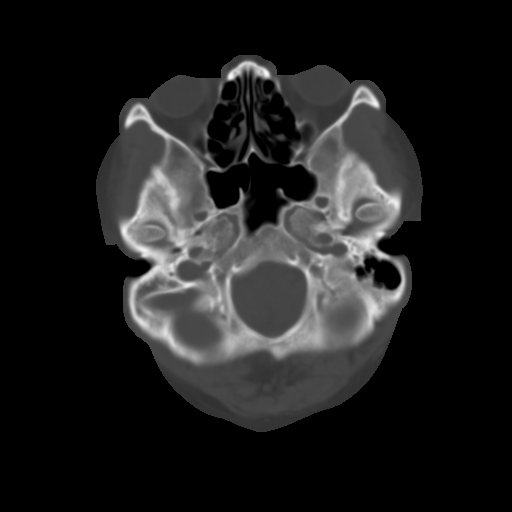
[im 8/31  brain]
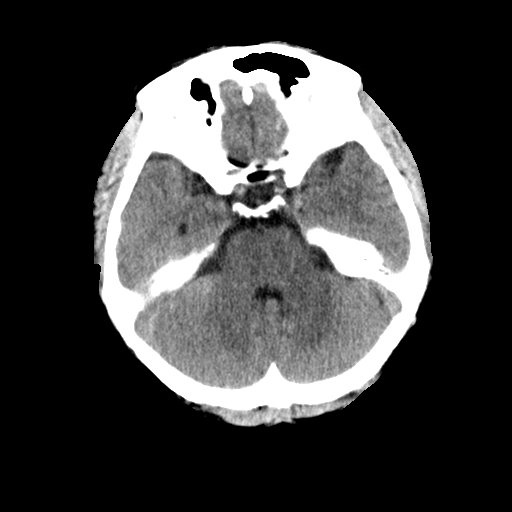
[im 12/31  brain]
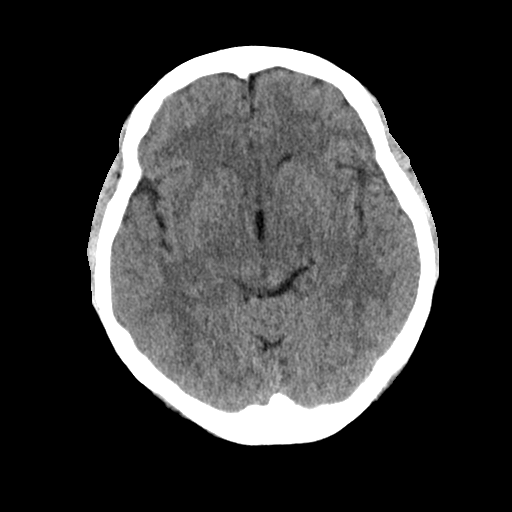
[im 16/31  brain]
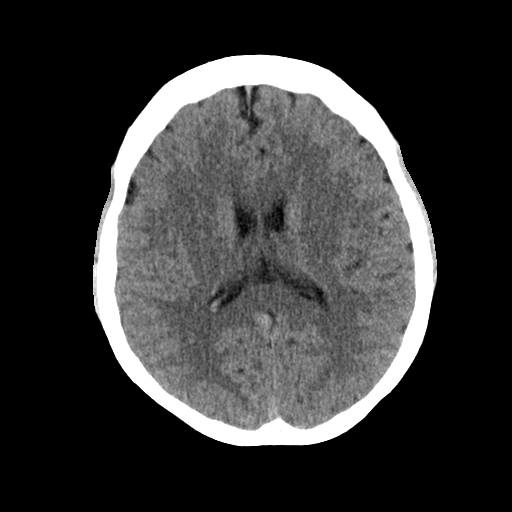
[im 19/31  brain]
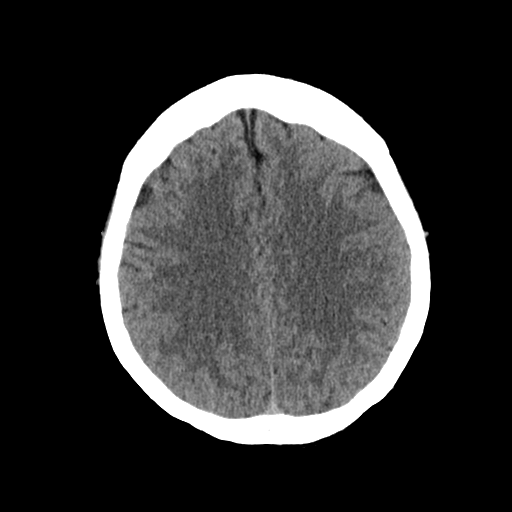
[im 19/31  bone]
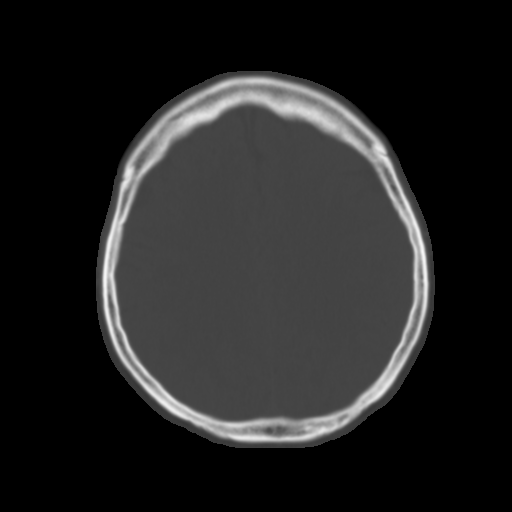
[im 23/31  brain]
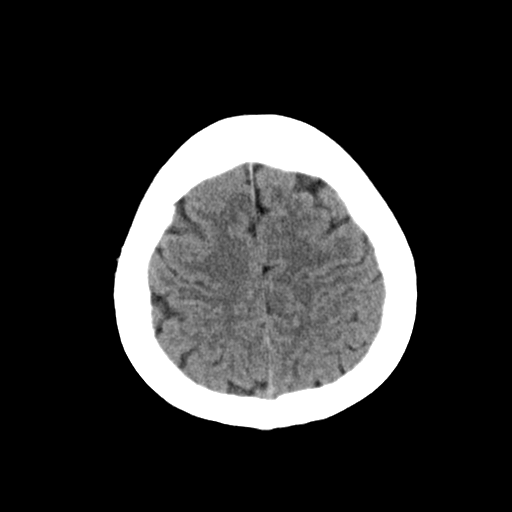
[im 27/31  brain]
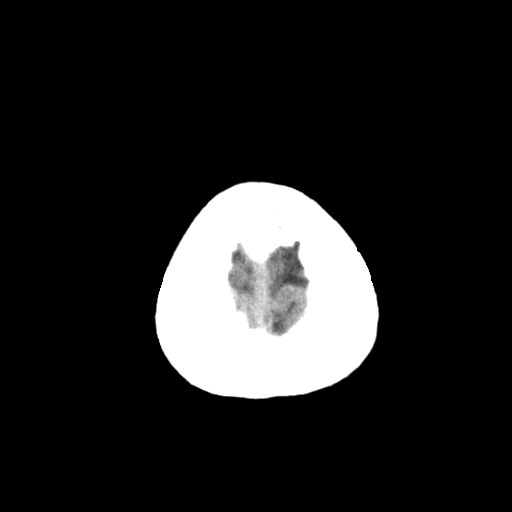

[Series 3: head bone · axial · 0.44mm/px · z∈[-21,+9]mm · 3 of 77 slices shown]
[im 8/77  bone]
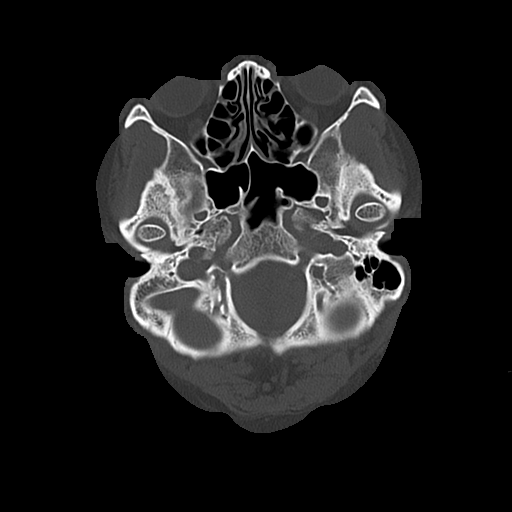
[im 16/77  bone]
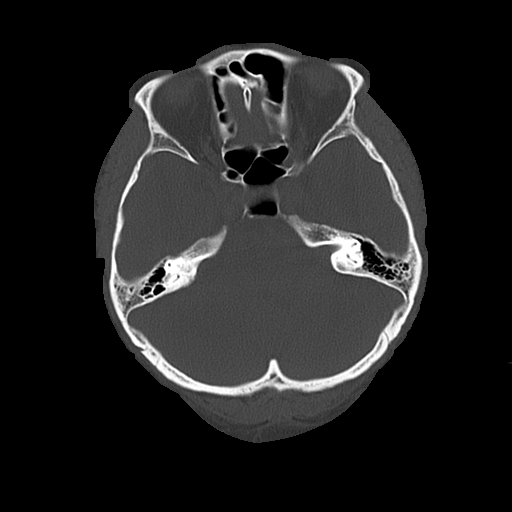
[im 23/77  bone]
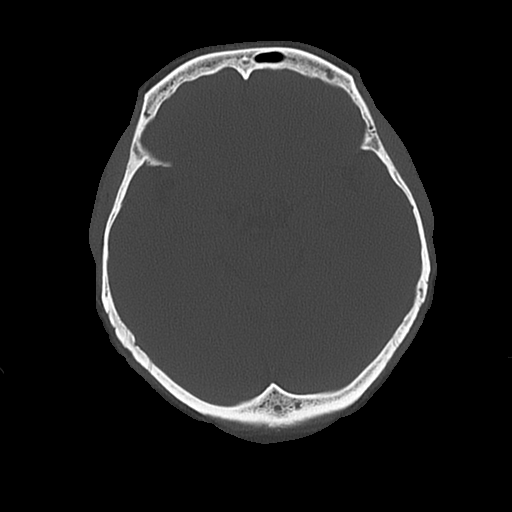

[Series 4: coronal soft · coronal · 0.35mm/px · 3 of 65 slices shown]
[im 22/65  brain]
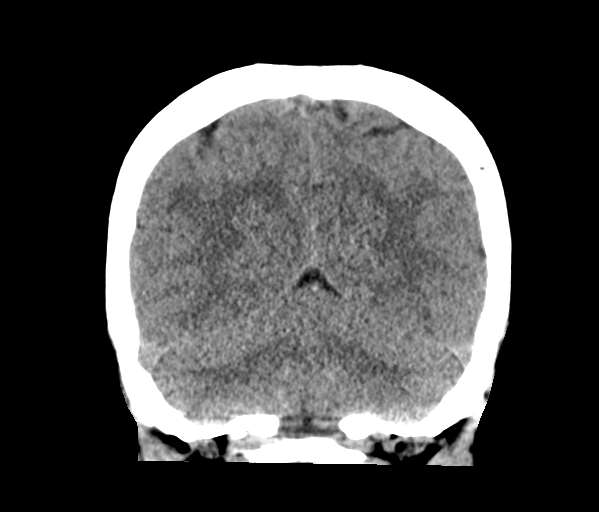
[im 29/65  brain]
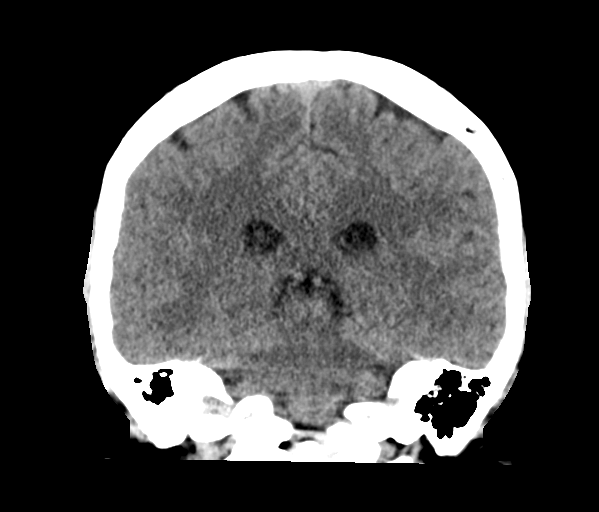
[im 36/65  brain]
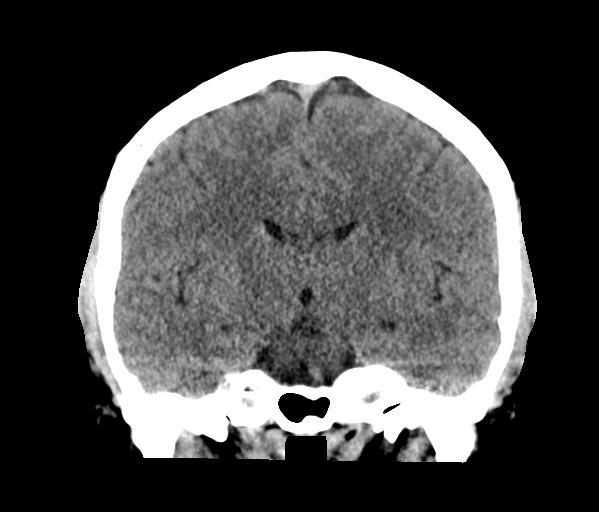

[Series 5: sagittal soft · sagittal · 0.32mm/px · 3 of 62 slices shown]
[im 21/62  brain]
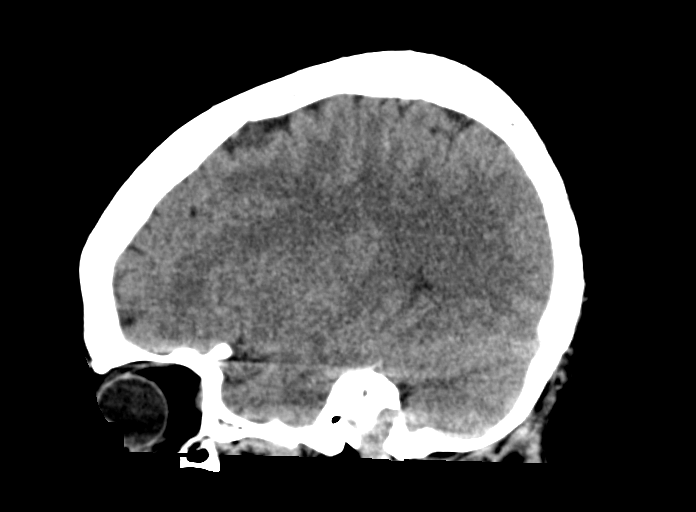
[im 31/62  brain]
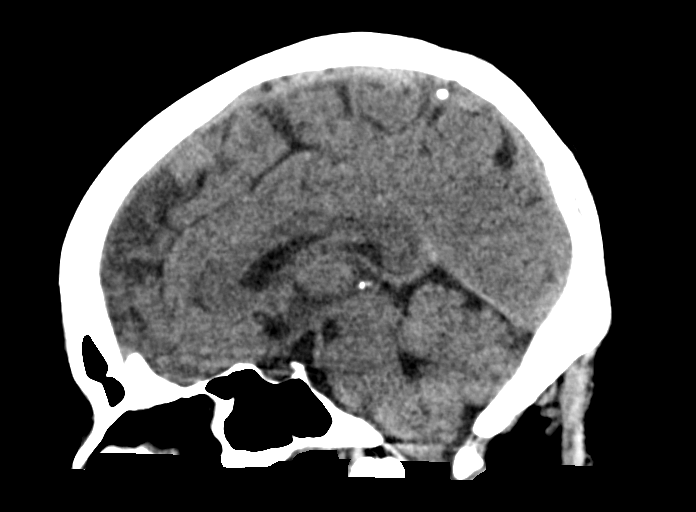
[im 41/62  brain]
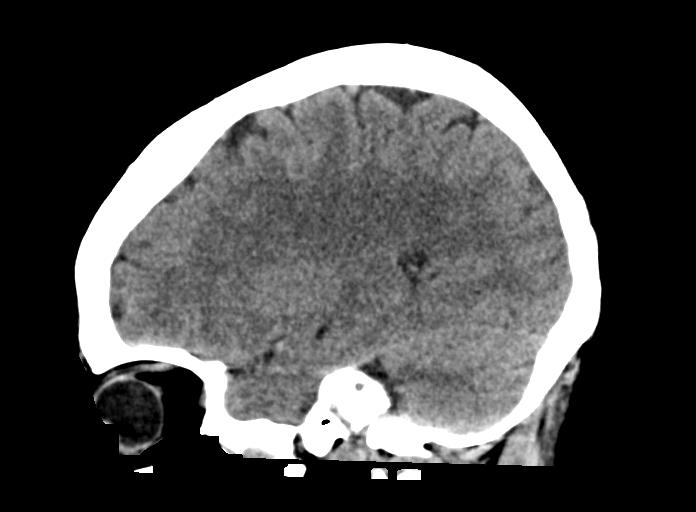

[16 of 47 positions shown; findings below may reference images not displayed]

FINDINGS: Brain: No evidence of acute infarction, hemorrhage, hydrocephalus,
extra-axial collection or mass lesion/mass effect.

Vascular: No hyperdense vessel or unexpected calcification.

Skull: Normal. Negative for fracture or focal lesion.

Sinuses/Orbits: No acute finding.

Other: None.
IMPRESSION: No acute intracranial pathology.

## 2021-04-28 IMAGING — CT CT ANGIO HEAD-NECK (W OR W/O PERF)
2 of 7 series · 8 of 33 positions shown · non-contrast
Comparison: None.

CLINICAL DATA: Acute neurologic deficit

EXAM:
CT ANGIOGRAPHY HEAD AND NECK
TECHNIQUE: Multidetector CT imaging of the head and neck was performed using
the standard protocol during bolus administration of intravenous
contrast. Multiplanar CT image reconstructions and MIPs were
obtained to evaluate the vascular anatomy. Carotid stenosis
measurements (when applicable) are obtained utilizing NASCET
criteria, using the distal internal carotid diameter as the
denominator.

[Series 4: cta head · axial · 0.52mm/px · z∈[-179,-69]mm · 2 of 167 slices shown]
[im 56/167  soft-tissue]
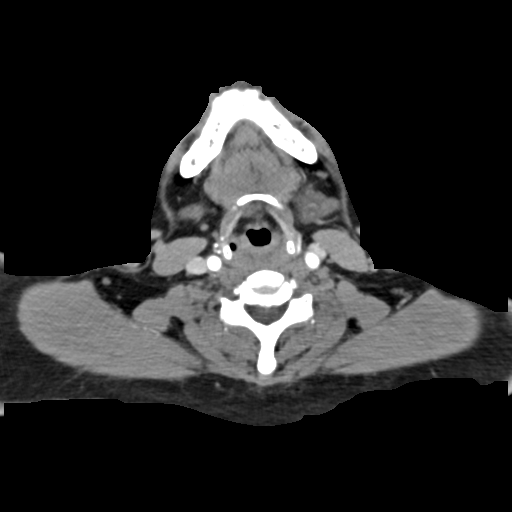
[im 111/167  soft-tissue]
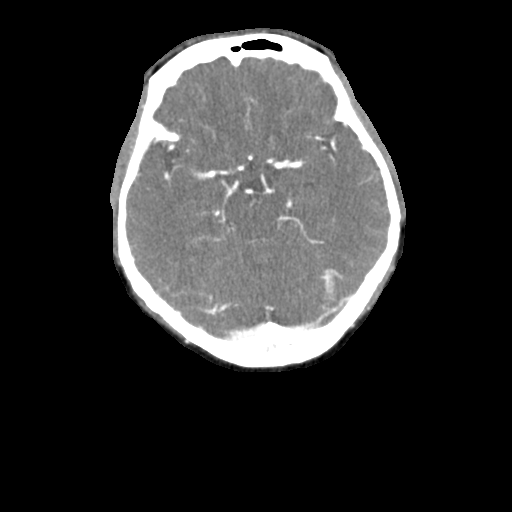

[Series 6: ax thin · axial · 0.49mm/px · z∈[-237,-7]mm · 6 of 323 slices shown]
[im 47/323  soft-tissue]
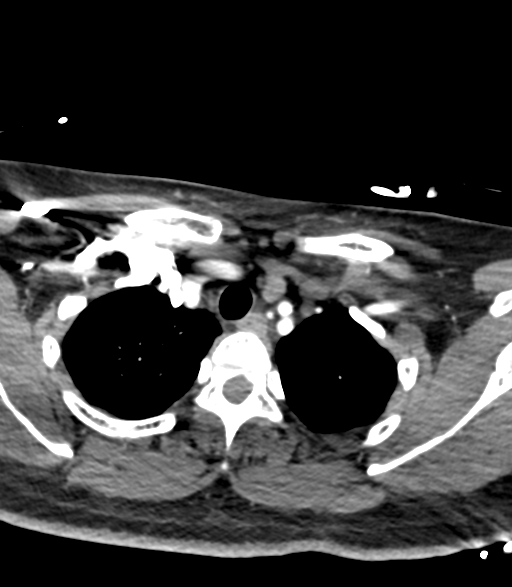
[im 93/323  bone]
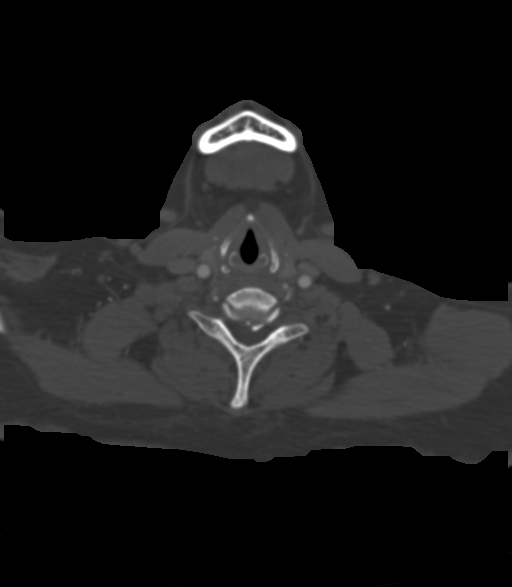
[im 139/323  soft-tissue]
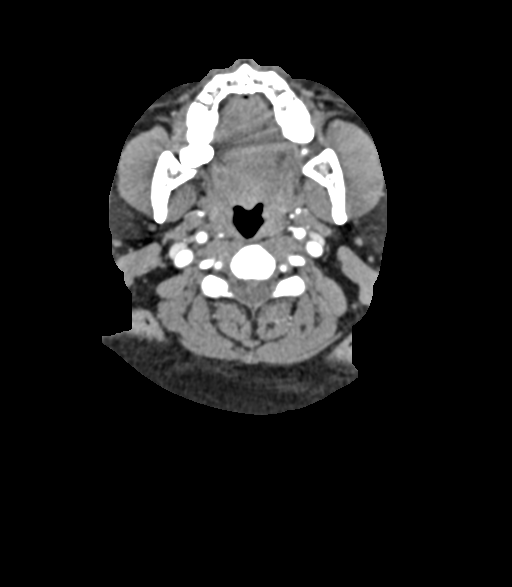
[im 185/323  bone]
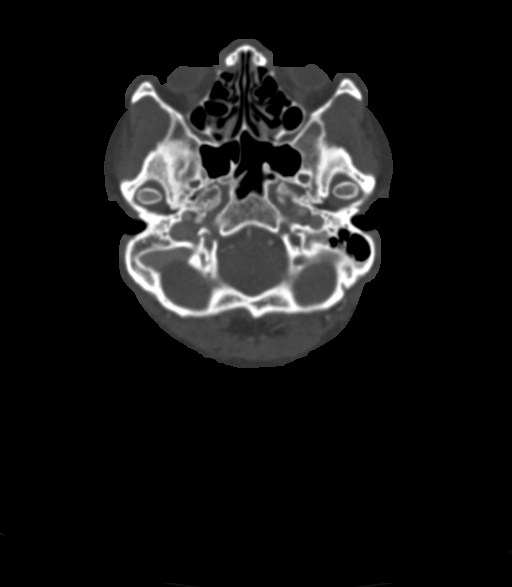
[im 231/323  soft-tissue]
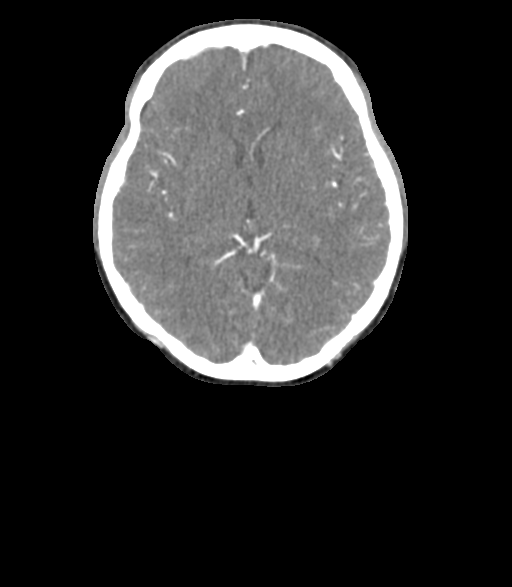
[im 277/323  bone]
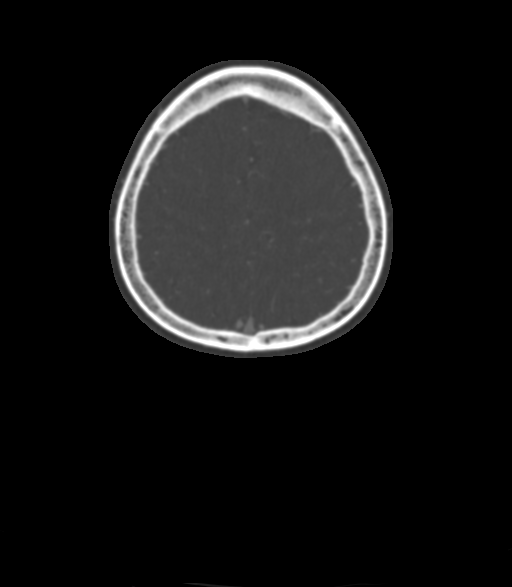

[8 of 33 positions shown; findings below may reference images not displayed]

RADIATION DOSE REDUCTION: This exam was performed according to the
departmental dose-optimization program which includes automated
exposure control, adjustment of the mA and/or kV according to
patient size and/or use of iterative reconstruction technique.

CONTRAST:  80mL OMNIPAQUE IOHEXOL 350 MG/ML SOLN
FINDINGS: CTA NECK FINDINGS

SKELETON: There is no bony spinal canal stenosis. No lytic or
blastic lesion.

OTHER NECK: Normal pharynx, larynx and major salivary glands. No
cervical lymphadenopathy. Unremarkable thyroid gland.

UPPER CHEST: No pneumothorax or pleural effusion. No nodules or
masses.

AORTIC ARCH:

There is no calcific atherosclerosis of the aortic arch. There is no
aneurysm, dissection or hemodynamically significant stenosis of the
visualized portion of the aorta. Conventional 3 vessel aortic
branching pattern. The visualized proximal subclavian arteries are
widely patent.

RIGHT CAROTID SYSTEM: Normal without aneurysm, dissection or
stenosis.

LEFT CAROTID SYSTEM: Normal without aneurysm, dissection or
stenosis.

VERTEBRAL ARTERIES: Left dominant configuration. Both origins are
clearly patent. There is no dissection, occlusion or flow-limiting
stenosis to the skull base (V1-V3 segments).

CTA HEAD FINDINGS

POSTERIOR CIRCULATION:

--Vertebral arteries: Normal V4 segments.

--Inferior cerebellar arteries: Normal.

--Basilar artery: Normal.

--Superior cerebellar arteries: Normal.

--Posterior cerebral arteries (PCA): Normal.

ANTERIOR CIRCULATION:

--Intracranial internal carotid arteries: Normal.

--Anterior cerebral arteries (ACA): Normal. Both A1 segments are
present. Patent anterior communicating artery (a-comm).

--Middle cerebral arteries (MCA): Normal.

VENOUS SINUSES: As permitted by contrast timing, patent.

ANATOMIC VARIANTS: None

Review of the MIP images confirms the above findings.
IMPRESSION: Normal CTA of the head and neck.

## 2021-04-28 MED ORDER — IOHEXOL 350 MG/ML SOLN
100.0000 mL | Freq: Once | INTRAVENOUS | Status: AC | PRN
  Administered 2021-04-28: 80 mL via INTRAVENOUS

## 2021-04-28 NOTE — ED Triage Notes (Signed)
Patient here POV from Home. ? ?Patient endorses yesterday she noted Blurry Vision and Headache that subsided somewhat. Today the Patient noted Blurry Vision as well and Difficulty Speaking (Expressive Aphagia). ? ?Patient was seen by PCP yesterday for Same and instructed to attempt OTC Medications.  ? ?LSN: 1030 on 04/27/21. No Fevers. Mild Nausea. 1 Episode of Emesis yesterday. No Diarrhea. No Numbness.  ? ?NAD Noted during Triage. A&Ox4. GCS 15. Ambulatory. ?

## 2021-04-28 NOTE — ED Notes (Signed)
Pt reports episode of aphasia and blurred vision while at home today cooking dinner. On assessment none present. Vision WDL. Pt states had headache yesterday and slight headache present today. No deficits present.  ?

## 2021-04-28 NOTE — ED Notes (Signed)
Report received and care resumed.

## 2021-04-28 NOTE — ED Notes (Signed)
Patient transported to CT 

## 2021-04-28 NOTE — ED Provider Notes (Signed)
?[facility] ?Provider Note ? ? ?CSN: [id number] ?Arrival date & time: 04/28/21  1859 ? ?  ? ?History ? ?Chief Complaint  ?Patient presents with  ? Aphasia  ? ? ?Joanne Jackson is a 46 y.o. female. ? ?HPI ?Patient presents with headache vision changes and difficulty speaking.  Began yesterday when she had episode of the headache.  Slight nausea with it.  However he was having blurred vision.  Just difficulty seeing.  States sort of lost vision on both temporal sides.  Also had some wavy lines on the side on the right eye.  Does not tend to get headaches.  Lasted a while.  No difficulty speaking at that time.  Saw PCP who gave her some Zofran.  Had some nausea with the episode.  Also had taken ibuprofen.  Had decreased headache.  The next day however developed some more of the blurred vision and difficulty speaking.  Had more stuttering and difficulty getting words out.  Was awake and appropriate with it otherwise.  States the words were right in her head.  The word part of it lasted around 20 minutes.  Now feels she is back to her baseline.  Headache did not get as bad as it had the day before.  Patient has a history of hypertension and is "prediabetic."  Not on treatment for diabetes. ?  ? ?Home Medications ?Prior to Admission medications   ?Medication Sig Start Date End Date Taking? Authorizing Provider  ?B Complex Vitamins (VITAMIN B COMPLEX PO) Take by mouth.    [provider]  ?Cholecalciferol (VITAMIN D3) 50 MCG (2000 UT) TABS Take 2,000 Units by mouth daily as needed.     [provider]  ?ferrous sulfate 324 (65 Fe) MG TBEC TAKE 1 TABLET BY MOUTH EVERY DAY 09/06/20   Leamon Arnt, MD  ?lisinopril (ZESTRIL) 40 MG tablet Take 1 tablet (40 mg total) by mouth daily. 02/27/21   Leamon Arnt, MD  ?ondansetron (ZOFRAN) 4 MG tablet Take 1 tablet (4 mg total) by mouth every 8 (eight) hours as needed for nausea or vomiting. 04/27/21   Jeanie Sewer, NP  ?RESTASIS 0.05  % ophthalmic emulsion  01/25/20   [provider]  ?   ? ?Allergies    ?Patient has no known allergies.   ? ?Review of Systems   ?Review of Systems  ?Constitutional:  Negative for appetite change.  ?Respiratory:  Negative for shortness of breath.   ?Musculoskeletal:  Negative for back pain.  ?Neurological:  Positive for speech difficulty and headaches.  ?Psychiatric/Behavioral:  Negative for confusion.   ? ?Physical Exam ?Updated Vital Signs ?BP (!) 134/93   Pulse 79   Temp 98.6 ?F (37 ?C) (Oral)   Resp 15   Ht '5\' 1"'$  (1.549 m)   Wt 106.9 kg   LMP 04/12/2021 (Approximate)   SpO2 99%   BMI 44.53 kg/m?  ?Physical Exam ?Vitals and nursing note reviewed.  ?HENT:  ?   Head: Atraumatic.  ?Cardiovascular:  ?   Rate and Rhythm: Regular rhythm.  ?Pulmonary:  ?   Breath sounds: Normal breath sounds.  ?Abdominal:  ?   Tenderness: There is no abdominal tenderness.  ?Musculoskeletal:     ?   General: No tenderness.  ?   Cervical back: Neck supple.  ?Skin: ?   Capillary Refill: Capillary refill takes less than 2 seconds.  ?Neurological:  ?   Mental Status: She is alert and oriented to person, place, and time.  Mental status is at baseline.  ?   Comments: Normal speech.  Eye movements intact.  Peripheral vision intact by confrontation.  Awake and appropriate.  Moving all extremities.  ? ? ?ED Results / Procedures / Treatments   ?Labs ?(all labs ordered are listed, but only abnormal results are displayed) ?Labs Reviewed  ?COMPREHENSIVE METABOLIC PANEL - Abnormal; Notable for the following components:  ?    Result Value  ? Glucose, Bld 135 (*)   ? All other components within normal limits  ?CBG MONITORING, ED - Abnormal; Notable for the following components:  ? Glucose-Capillary 133 (*)   ? All other components within normal limits  ?PROTIME-INR  ?APTT  ?CBC  ?DIFFERENTIAL  ?PREGNANCY, URINE  ? ? ?EKG ?EKG Interpretation ? ?Date/Time:  Friday April 28 2021 19:37:04 EDT ?Ventricular Rate:  76 ?PR Interval:  168 ?QRS  Duration: 78 ?QT Interval:  366 ?QTC Calculation: 411 ?R Axis:   61 ?Text Interpretation: Normal sinus rhythm Normal ECG No previous ECGs available Confirmed by Davonna Belling [phone]) on 04/28/2021 8:20:57 PM ? ?Radiology ?CT HEAD WO CONTRAST ? ?Result Date: 04/28/2021 ?CLINICAL DATA:  Headache. EXAM: CT HEAD WITHOUT CONTRAST TECHNIQUE: Contiguous axial images were obtained from the base of the skull through the vertex without intravenous contrast. RADIATION DOSE REDUCTION: This exam was performed according to the departmental dose-optimization program which includes automated exposure control, adjustment of the mA and/or kV according to patient size and/or use of iterative reconstruction technique. COMPARISON:  None. FINDINGS: Brain: No evidence of acute infarction, hemorrhage, hydrocephalus, extra-axial collection or mass lesion/mass effect. Vascular: No hyperdense vessel or unexpected calcification. Skull: Normal. Negative for fracture or focal lesion. Sinuses/Orbits: No acute finding. Other: None. IMPRESSION: No acute intracranial pathology. Electronically Signed   By: Virgina Norfolk M.D.   On: 04/28/2021 20:16   ? ?Procedures ?Procedures  ? ? ?Medications Ordered in ED ?Medications  ?iohexol (OMNIPAQUE) 350 MG/ML injection 100 mL (80 mLs Intravenous Contrast Given 04/28/21 2153)  ? ? ?ED Course/ Medical Decision Making/ A&P ?  ?                        ?Medical Decision Making ?Amount and/or Complexity of Data Reviewed ?Labs: ordered. ?Radiology: ordered. ? ?Risk ?Prescription drug management. ? ? ?Patient presents with headache vision changes and difficulty speaking.  Initial differential diagnosis somewhat long and does include life-threatening condition such as stroke.  Had episode yesterday and another episode today.  Yesterday had more of a prominent headache today less of a headache.  Does not have history of headaches.  Potentially with some of the vision changes could be complicated migraine, however  no diagnosis of migraines in the past.  Patient's ABCD 2 score would put her at moderate risk with a score of 4.  With this high risk I feel that she would benefit from admission to the hospital for TIA rule out and more work-up that is not available here such as likely MRI.  Right now has no neurodeficits so not a tPA candidate even if this is a stroke.  Will discuss with hospitalist for admission ? ? ? ? ? ? ? ?Final Clinical Impression(s) / ED Diagnoses ?Final diagnoses:  ?TIA (transient ischemic attack)  ? ? ?Rx / DC Orders ?ED Discharge Orders   ? ? None  ? ?  ? ? ?  ?Davonna Belling, MD ?04/28/21 2209 ? ?

## 2021-04-29 ENCOUNTER — Observation Stay (HOSPITAL_COMMUNITY): Payer: 59

## 2021-04-29 ENCOUNTER — Encounter (HOSPITAL_COMMUNITY): Payer: Self-pay | Admitting: Internal Medicine

## 2021-04-29 DIAGNOSIS — D496 Neoplasm of unspecified behavior of brain: Secondary | ICD-10-CM

## 2021-04-29 DIAGNOSIS — R4701 Aphasia: Secondary | ICD-10-CM | POA: Diagnosis present

## 2021-04-29 DIAGNOSIS — Z79899 Other long term (current) drug therapy: Secondary | ICD-10-CM | POA: Diagnosis not present

## 2021-04-29 DIAGNOSIS — I10 Essential (primary) hypertension: Secondary | ICD-10-CM | POA: Diagnosis not present

## 2021-04-29 DIAGNOSIS — E785 Hyperlipidemia, unspecified: Secondary | ICD-10-CM | POA: Diagnosis not present

## 2021-04-29 DIAGNOSIS — R2990 Unspecified symptoms and signs involving the nervous system: Secondary | ICD-10-CM | POA: Diagnosis not present

## 2021-04-29 DIAGNOSIS — G459 Transient cerebral ischemic attack, unspecified: Secondary | ICD-10-CM | POA: Diagnosis not present

## 2021-04-29 DIAGNOSIS — C719 Malignant neoplasm of brain, unspecified: Secondary | ICD-10-CM | POA: Diagnosis not present

## 2021-04-29 DIAGNOSIS — E1169 Type 2 diabetes mellitus with other specified complication: Secondary | ICD-10-CM | POA: Diagnosis not present

## 2021-04-29 LAB — GLUCOSE, CAPILLARY
Glucose-Capillary: 191 mg/dL — ABNORMAL HIGH (ref 70–99)
Glucose-Capillary: 193 mg/dL — ABNORMAL HIGH (ref 70–99)

## 2021-04-29 IMAGING — MR MR HEAD W/ CM
4 of 5 series · 13 of 48 positions shown · IV contrast (10ML GADAVIST)
Comparison: Brain MRI without contrast [DATE]

CLINICAL DATA: Intracranial mass

EXAM:
MRI HEAD WITH CONTRAST
TECHNIQUE: Multiplanar, multiecho pulse sequences of the brain and surrounding
structures were obtained with intravenous contrast.
CONTRAST:  10mL GADAVIST GADOBUTROL 1 MMOL/ML IV SOLN

[Series 3: T2 post-contrast · coronal · 5.0mm · 0.20mm/px · 3 of 28 slices shown]
[im 4/28]
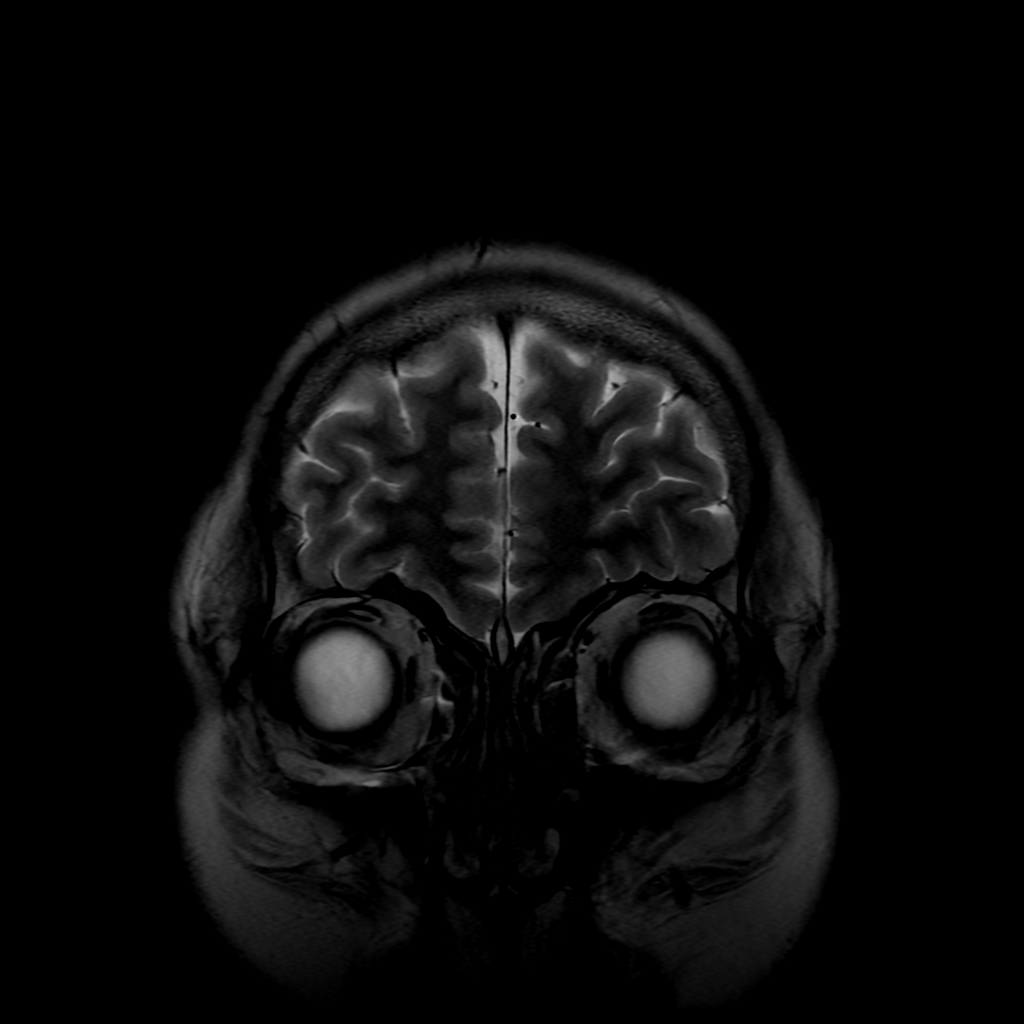
[im 16/28]
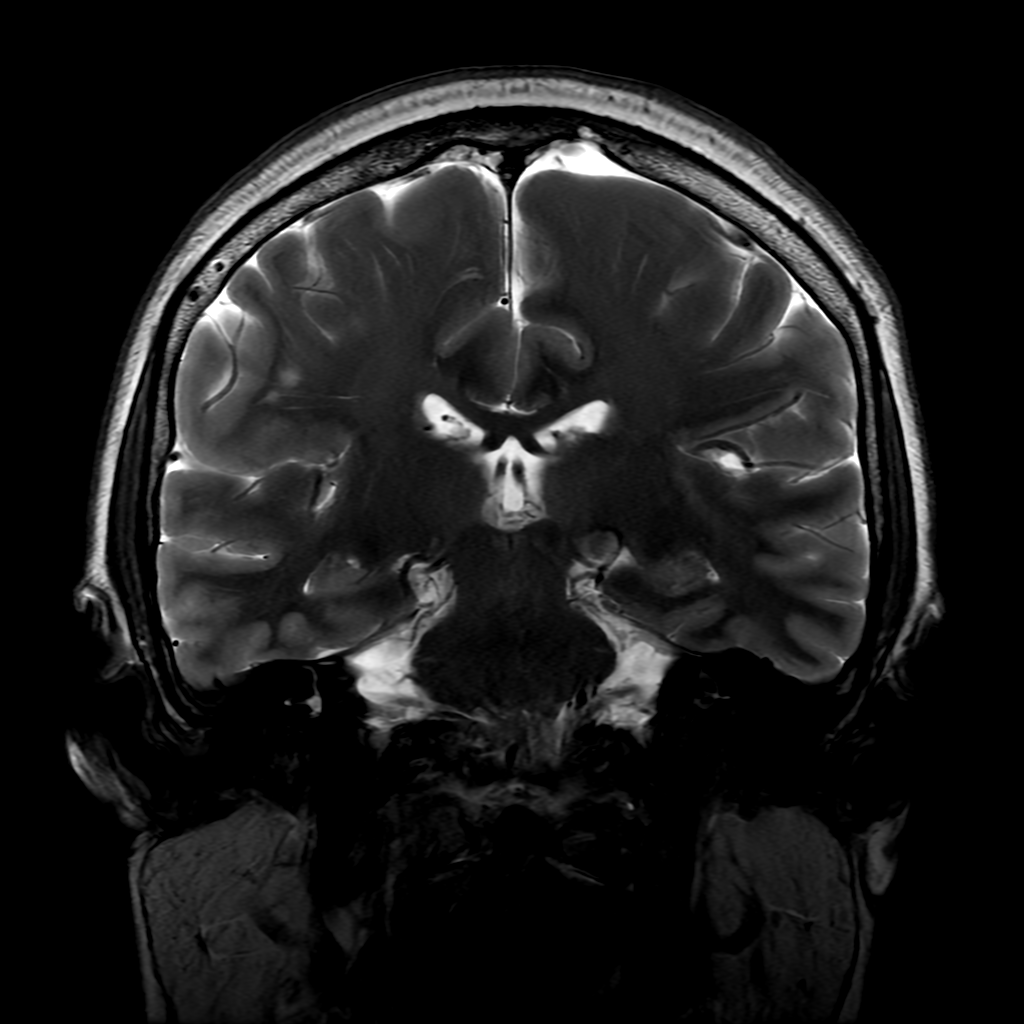
[im 24/28]
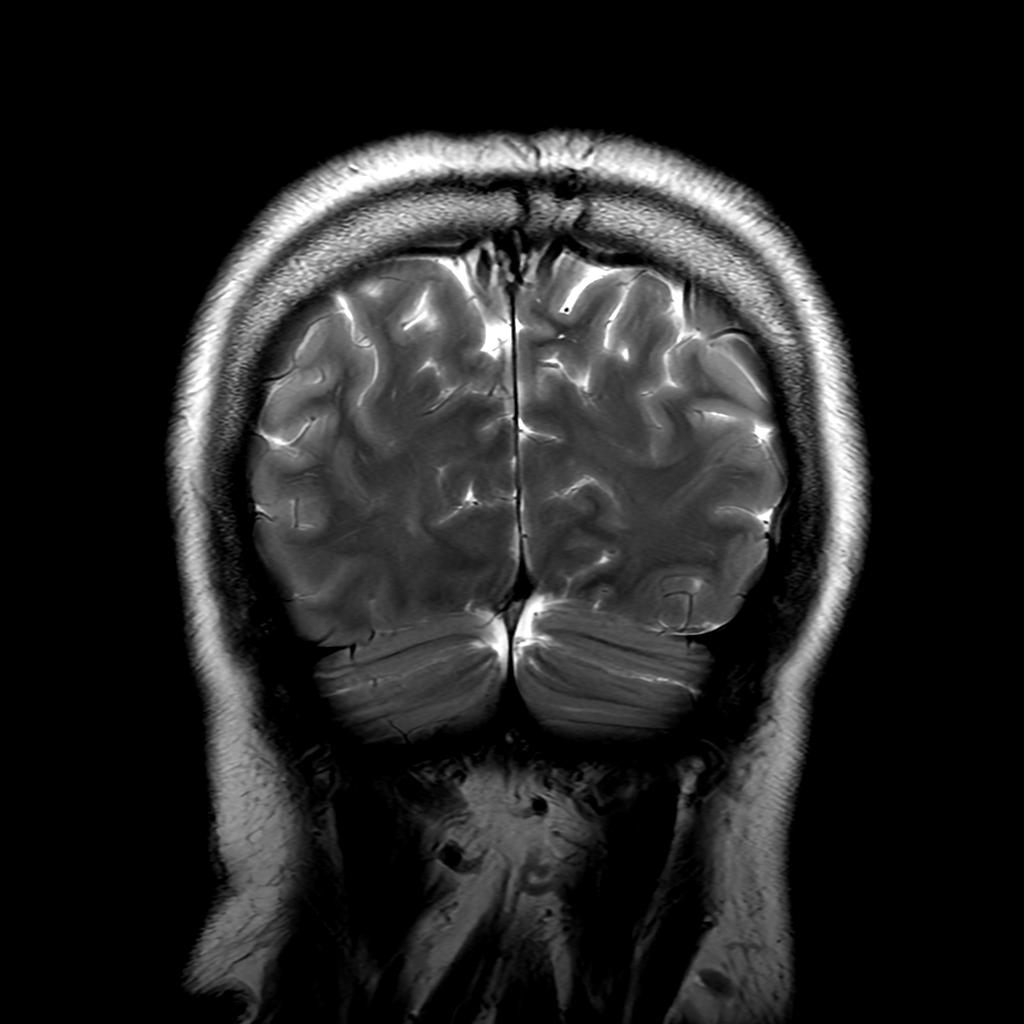

[Series 4: T1 · axial · 3.0mm · 0.94mm/px · z∈[-60,+39]mm · 3 of 52 slices shown (1 of 2)]
[im 9/52]
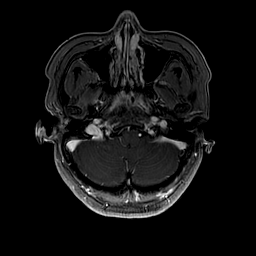
[im 26/52]
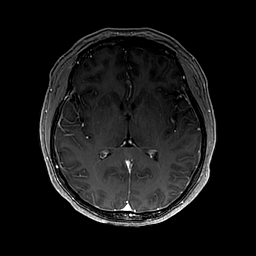
[im 43/52]
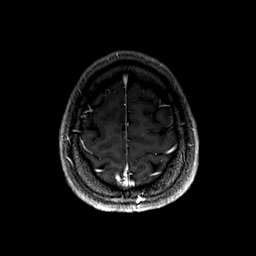

[Series 5: T1 · coronal · 5.0mm · 0.39mm/px · 3 of 28 slices shown (2 of 2)]
[im 5/28]
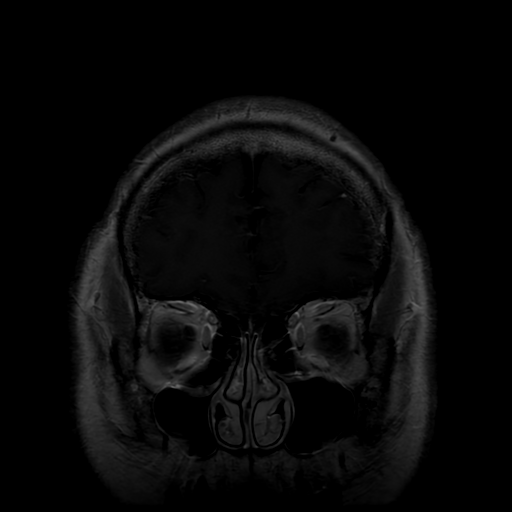
[im 14/28]
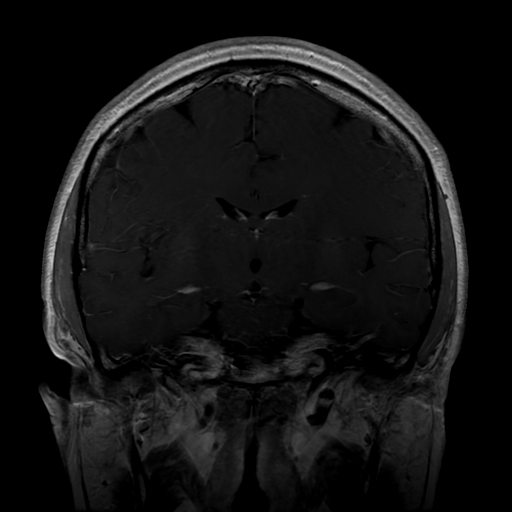
[im 23/28]
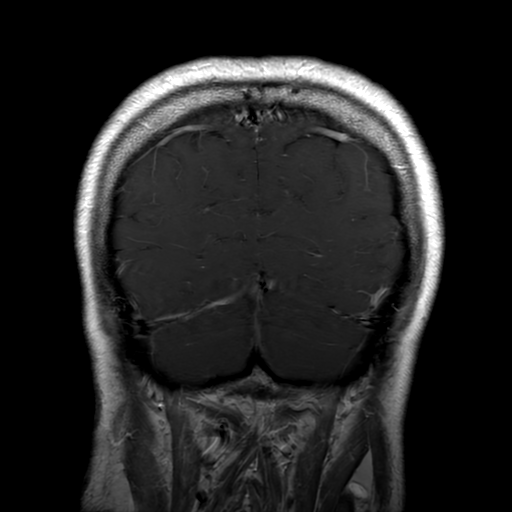

[Series 6: FLAIR post-contrast · sagittal · 5.0mm · 0.47mm/px · 4 of 25 slices shown]
[im 1/25]
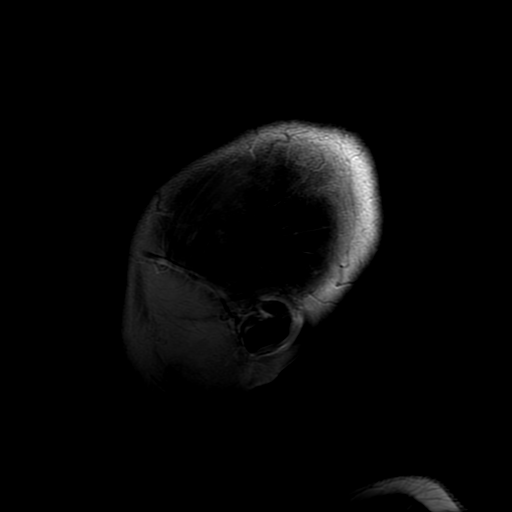
[im 5/25]
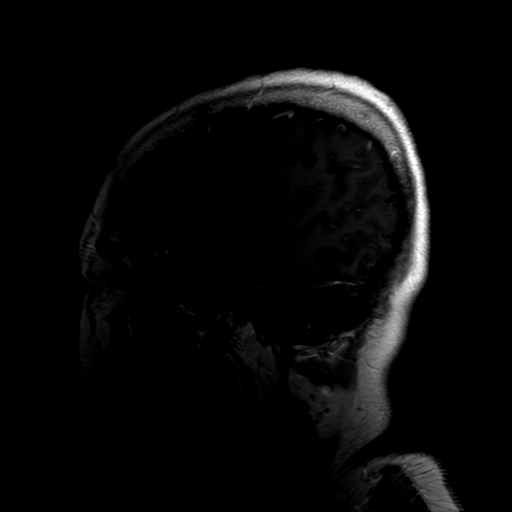
[im 15/25]
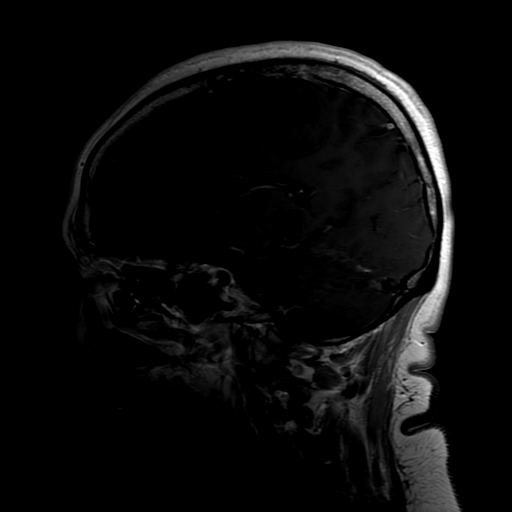
[im 25/25]
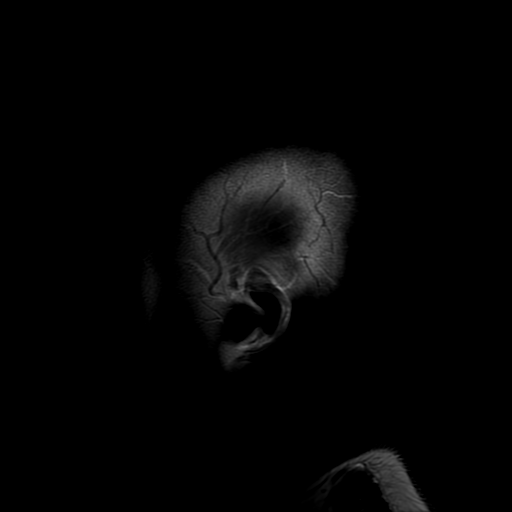

[13 of 48 positions shown; findings below may reference images not displayed]

FINDINGS: Postcontrast sequences of the brain are obtained to further evaluate
the area of signal abnormality in the right frontal lobe. There is
no contrast enhancement at this location but the appearance remains
concerning for neoplasm. Otherwise, the appearance of the brain on
postcontrast images is normal.
IMPRESSION: No contrast enhancement at the site of signal abnormality in the
right frontal lobe. This remains highly concerning for tumor, most
likely a low-grade glioma.

These results were discussed by telephone at the time of
interpretation on [DATE] at [DATE] with Dr. NEUZA, Who verbally
acknowledged these results.

## 2021-04-29 IMAGING — MR MR HEAD W/O CM
6 of 11 series · 24 of 48 positions shown · non-contrast
Comparison: CT/CTA head and neck dated 1 day prior

CLINICAL DATA: Headache, 2 episodes of vision changes in the last 2
days, word-finding difficulties, now resolved

EXAM:
MRI HEAD WITHOUT CONTRAST
TECHNIQUE: Multiplanar, multiecho pulse sequences of the brain and surrounding
structures were obtained without intravenous contrast.

[Series 2: DWI · axial · 3.0mm · 0.94mm/px · z∈[-111,+32]mm · 7 of 100 slices shown (1 of 2)]
[im 1/100]
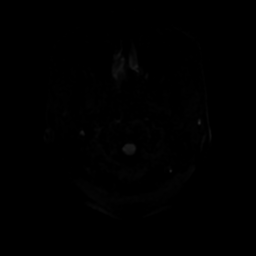
[im 17/100]
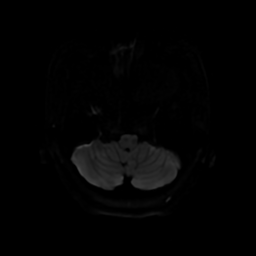
[im 34/100]
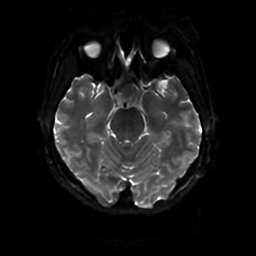
[im 50/100]
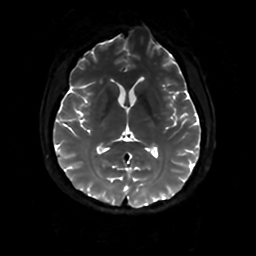
[im 67/100]
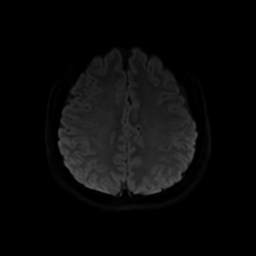
[im 83/100]
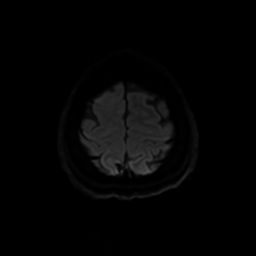
[im 100/100]
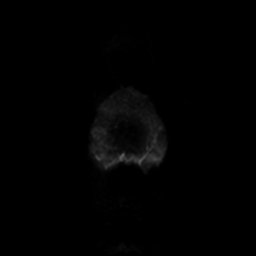

[Series 3: DWI · coronal · 4.0mm · 0.94mm/px · 5 of 74 slices shown (2 of 2)]
[im 1/74]
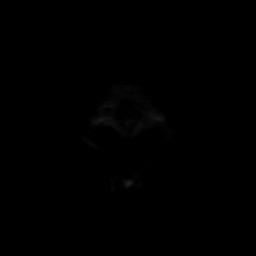
[im 19/74]
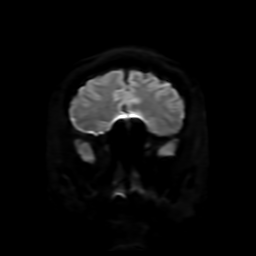
[im 37/74]
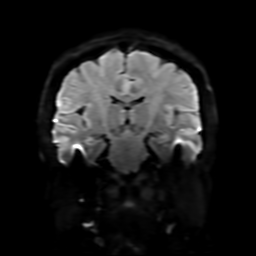
[im 55/74]
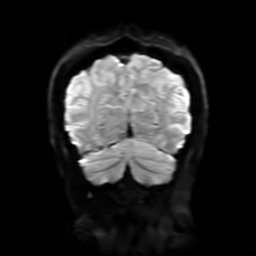
[im 74/74]
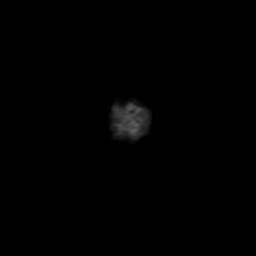

[Series 4: FLAIR · sagittal · 5.0mm · 0.23mm/px · 2 of 25 slices shown (1 of 2)]
[im 1/25]
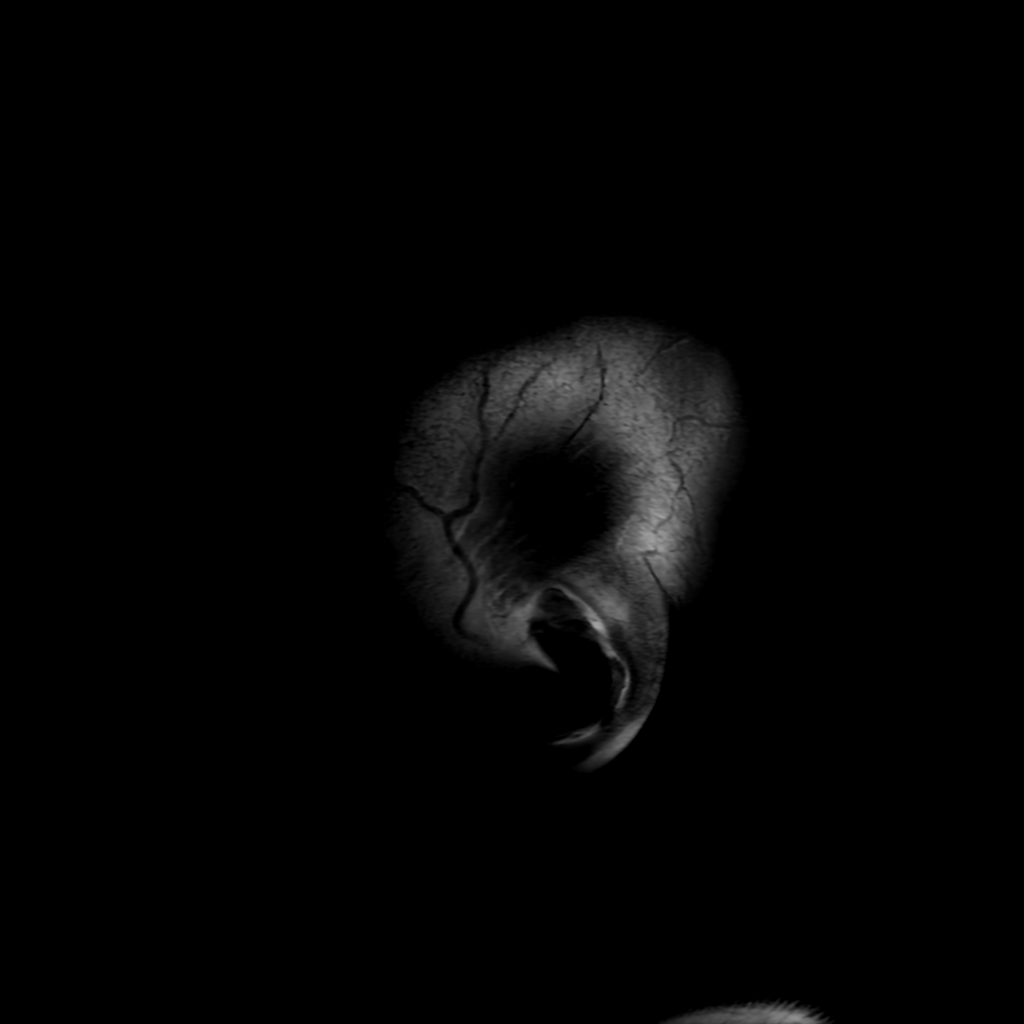
[im 25/25]
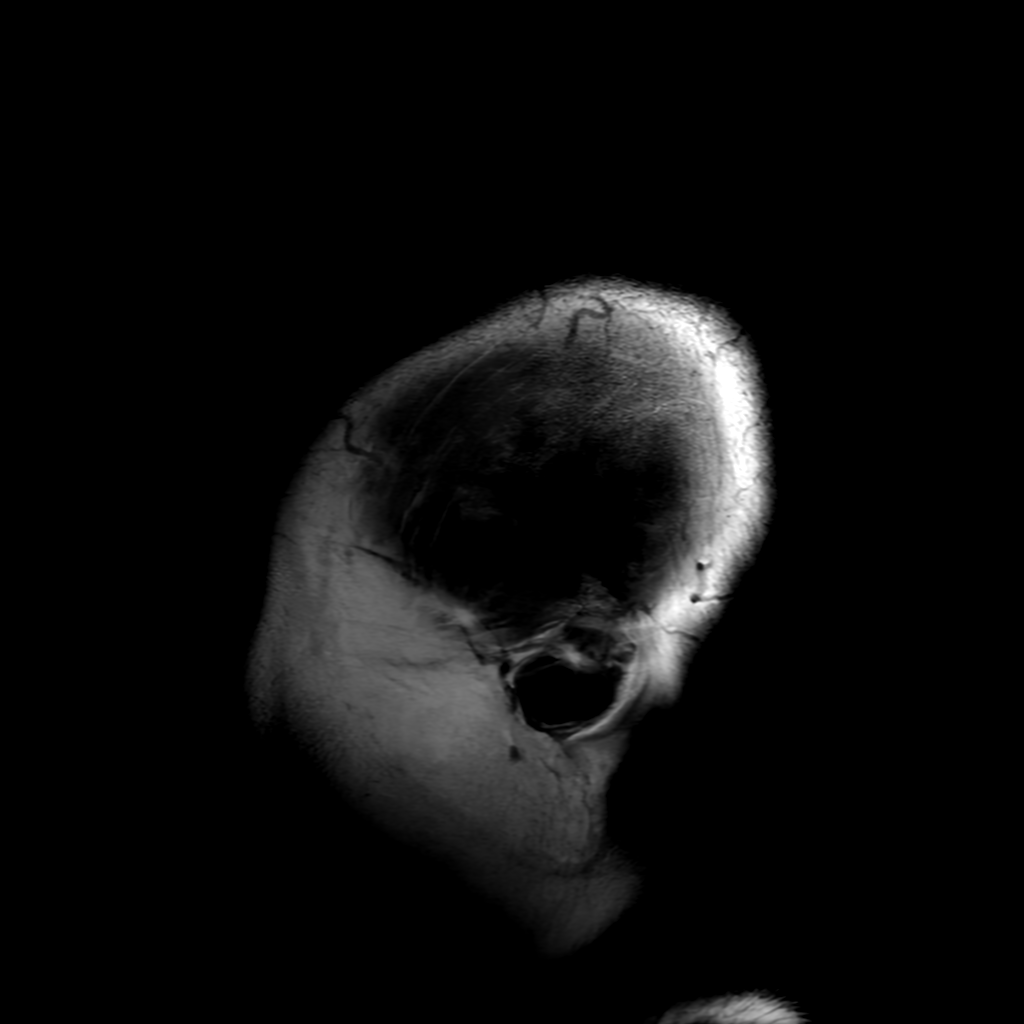

[Series 6: FLAIR · axial · 4.0mm · 0.45mm/px · z∈[-105,+42]mm · 3 of 35 slices shown (2 of 2)]
[im 1/35]
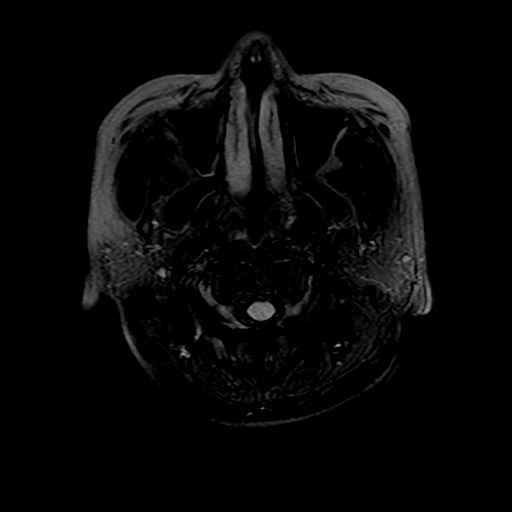
[im 18/35]
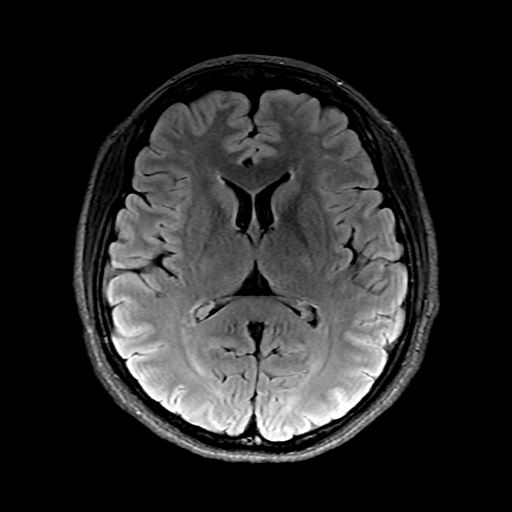
[im 35/35]
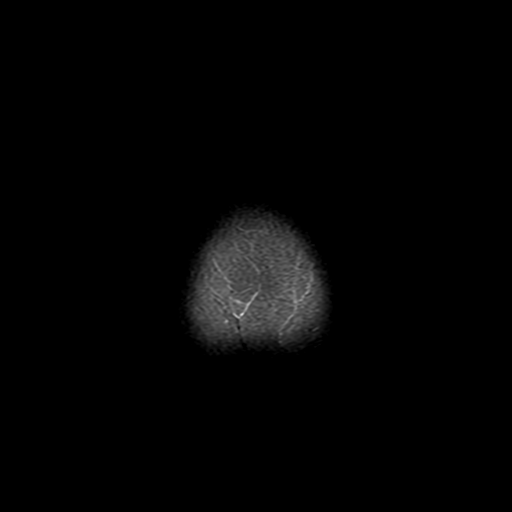

[Series 250: ADC · axial · 3.0mm · 0.94mm/px · z∈[-111,+32]mm · 4 of 49 slices shown (1 of 2)]
[im 1/49]
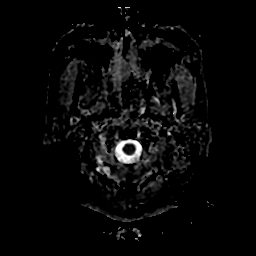
[im 17/49]
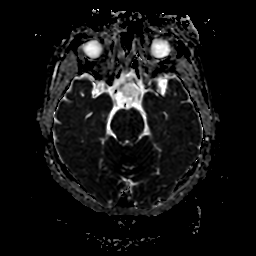
[im 33/49]
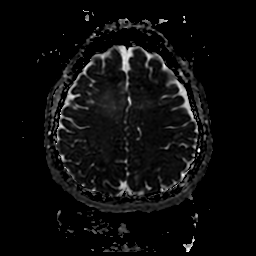
[im 49/49]
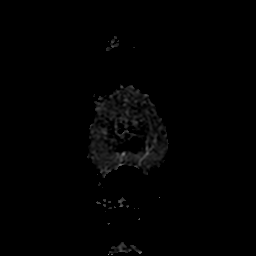

[Series 350: ADC · coronal · 4.0mm · 0.94mm/px · 3 of 36 slices shown (2 of 2)]
[im 1/36]
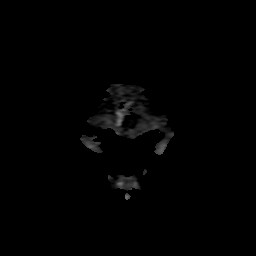
[im 18/36]
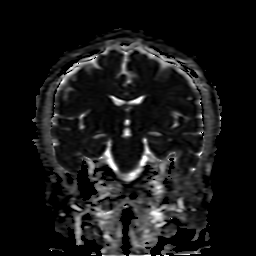
[im 36/36]
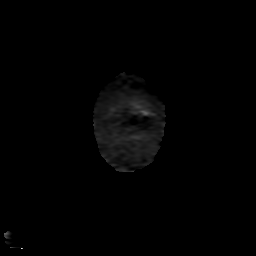

[24 of 48 positions shown; findings below may reference images not displayed]

FINDINGS: Brain: There is no evidence of acute intracranial hemorrhage,
extra-axial fluid collection, or acute infarct.

There is mildly expansile FLAIR signal abnormality centered in the
right superior frontal gyrus near the vertex measuring up to
approximally 2.4 cm x 1.9 cm in the axial plane (6-28). There is no
associated diffusion restriction or hemorrhage.

Scattered small foci of nonspecific FLAIR signal abnormality in the
remainder of the subcortical and periventricular white matter are
also seen, also without associated diffusion restriction or
hemorrhage. Background parenchymal volume is normal. The ventricles
are normal in size. There is no midline shift.

Vascular: Normal flow voids.

Skull and upper cervical spine: Normal marrow signal.

Sinuses/Orbits: There is mild mucosal thickening in the paranasal
sinuses. The globes and orbits are unremarkable

Other: None.
IMPRESSION: Expansile FLAIR signal abnormality in the right superior frontal
gyrus is suspicious for underlying mass lesion. Recommend
postcontrast MRI of the brain for further evaluation.

## 2021-04-29 MED ORDER — ACETAMINOPHEN 160 MG/5ML PO SOLN: 650.0000 mg | ORAL | Status: DC | PRN

## 2021-04-29 MED ORDER — INSULIN ASPART 100 UNIT/ML IJ SOLN
0.0000 [IU] | Freq: Three times a day (TID) | INTRAMUSCULAR | Status: DC
  Administered 2021-04-29: 3 [IU] via SUBCUTANEOUS

## 2021-04-29 MED ORDER — ACETAMINOPHEN 650 MG RE SUPP: 650.0000 mg | RECTAL | Status: DC | PRN

## 2021-04-29 MED ORDER — ASPIRIN EC 81 MG PO TBEC
81.0000 mg | DELAYED_RELEASE_TABLET | Freq: Every day | ORAL | Status: DC
  Administered 2021-04-29: 81 mg via ORAL
  Filled 2021-04-29: qty 1

## 2021-04-29 MED ORDER — SENNOSIDES-DOCUSATE SODIUM 8.6-50 MG PO TABS: 1.0000 | ORAL_TABLET | Freq: Every evening | ORAL | Status: DC | PRN

## 2021-04-29 MED ORDER — ACETAMINOPHEN 325 MG PO TABS: 650.0000 mg | ORAL_TABLET | ORAL | Status: DC | PRN

## 2021-04-29 MED ORDER — ENOXAPARIN SODIUM 40 MG/0.4ML IJ SOSY
40.0000 mg | PREFILLED_SYRINGE | INTRAMUSCULAR | Status: DC
  Administered 2021-04-29: 40 mg via SUBCUTANEOUS
  Filled 2021-04-29: qty 0.4

## 2021-04-29 MED ORDER — STROKE: EARLY STAGES OF RECOVERY BOOK
Freq: Once | Status: AC
  Filled 2021-04-29: qty 1

## 2021-04-29 MED ORDER — GADOBUTROL 1 MMOL/ML IV SOLN
10.0000 mL | Freq: Once | INTRAVENOUS | Status: AC | PRN
  Administered 2021-04-29: 10 mL via INTRAVENOUS

## 2021-04-29 MED ORDER — CYCLOSPORINE 0.05 % OP EMUL
1.0000 [drp] | Freq: Two times a day (BID) | OPHTHALMIC | Status: DC
  Administered 2021-04-29: 1 [drp] via OPHTHALMIC
  Filled 2021-04-29 (×3): qty 30

## 2021-04-29 NOTE — ED Notes (Signed)
Rounded on pt. Pt currently in bed with eyes close and family at bedside. VSS // no signs of distress. Will continue to monitor.  ?

## 2021-04-29 NOTE — Progress Notes (Signed)
OT Cancellation Note ? ?Patient Details ?Name: Joanne Jackson ?MRN: [id number] ?DOB: 1975-06-29 ? ? ?Cancelled Treatment:    Reason Eval/Treat Not Completed: OT screened, no needs identified, will sign off (Pt at her indep baseline, OT will sign off. Thank you.) ? ?Lizzie Cokley A Jesua Tamblyn ?04/29/2021, 4:05 PM ?

## 2021-04-29 NOTE — Progress Notes (Signed)
Mrs.Britten arrived via [facility] to room 3w24. Patient is alert; oriented to room and unit routiine. Ambulated to bathroom independently and returned to bed. ?

## 2021-04-29 NOTE — Evaluation (Signed)
Physical Therapy Evaluation & Discharge ?Patient Details ?Name: Joanne Jackson ?MRN: [id number] ?DOB: November 04, 1975 ?Today's Date: 04/29/2021 ? ?History of Present Illness ? 46 y/o female presented to [facility] ED on 04/28/21 for complaints of aphasia and vision changes. MRI pending. Transferred to [facility]. PMH: Sjogren's, HTN, pre-diabetes  ?Clinical Impression ? Patient admitted with above. Patient functioning at independent level for mobility with no AD. Able to negotiate flight of stairs to access interior of her home. Patient reports feeling back to baseline with physical abilities, vision, and speech. No further skilled PT needs identified acutely. No PT follow up recommended at this time. Will sign off.    ?   ? ?Recommendations for follow up therapy are one component of a multi-disciplinary discharge planning process, led by the attending physician.  Recommendations may be updated based on patient status, additional functional criteria and insurance authorization. ? ?Follow Up Recommendations No PT follow up ? ?  ?Assistance Recommended at Discharge None  ?Patient can return home with the following ?   ? ?  ?Equipment Recommendations None recommended by PT  ?Recommendations for Other Services ?    ?  ?Functional Status Assessment Patient has not had a recent decline in their functional status  ? ?  ?Precautions / Restrictions Precautions ?Precautions: None ?Restrictions ?Weight Bearing Restrictions: No  ? ?  ? ?Mobility ? Bed Mobility ?Overal bed mobility: Independent ?  ?  ?  ?  ?  ?  ?  ?  ? ?Transfers ?Overall transfer level: Independent ?  ?  ?  ?  ?  ?  ?  ?  ?  ?  ? ?Ambulation/Gait ?Ambulation/Gait assistance: Independent ?Gait Distance (Feet): 300 Feet ?Assistive device: None ?Gait Pattern/deviations: WFL(Within Functional Limits) ?  ?Gait velocity interpretation: >4.37 ft/sec, indicative of normal walking speed ?  ?  ? ?Stairs ?Stairs: Yes ?Stairs assistance: Independent ?Stair Management: One rail Right,  Alternating pattern, Forwards ?Number of Stairs: 10 ?  ? ?Wheelchair Mobility ?  ? ?Modified Rankin (Stroke Patients Only) ?  ? ?  ? ?Balance Overall balance assessment: No apparent balance deficits (not formally assessed) ?  ?  ?  ?  ?  ?  ?  ?  ?  ?  ?  ?  ?  ?  ?  ?  ?  ?  ?   ? ? ? ?Pertinent Vitals/Pain Pain Assessment ?Pain Assessment: No/denies pain  ? ? ?Home Living Family/patient expects to be discharged to:: Private residence ?Living Arrangements: Spouse/significant other ?Available Help at Discharge: Family ?Type of Home: House ?Home Access: Level entry ?  ?  ?Alternate Level Stairs-Number of Steps: flight ?Home Layout: Two level ?  ?   ?  ?Prior Function Prior Level of Function : Independent/Modified Independent ?  ?  ?  ?  ?  ?  ?  ?  ?  ? ? ?Hand Dominance  ?   ? ?  ?Extremity/Trunk Assessment  ? Upper Extremity Assessment ?Upper Extremity Assessment: Overall WFL for tasks assessed ?  ? ?Lower Extremity Assessment ?Lower Extremity Assessment: Overall WFL for tasks assessed ?  ? ?Cervical / Trunk Assessment ?Cervical / Trunk Assessment: Normal  ?Communication  ? Communication: No difficulties  ?Cognition Arousal/Alertness: Awake/alert ?Behavior During Therapy: [facility] for tasks assessed/performed ?Overall Cognitive Status: Within Functional Limits for tasks assessed ?  ?  ?  ?  ?  ?  ?  ?  ?  ?  ?  ?  ?  ?  ?  ?  ?  ?  ?  ? ?  ?  General Comments   ? ?  ?Exercises    ? ?Assessment/Plan  ?  ?PT Assessment Patient does not need any further PT services  ?PT Problem List   ? ?   ?  ?PT Treatment Interventions     ? ?PT Goals (Current goals can be found in the Care Plan section)  ?Acute Rehab PT Goals ?Patient Stated Goal: to go home ?PT Goal Formulation: All assessment and education complete, DC therapy ? ?  ?Frequency   ?  ? ? ?Co-evaluation   ?  ?  ?  ?  ? ? ?  ?AM-PAC PT "6 Clicks" Mobility  ?Outcome Measure Help needed turning from your back to your side while in a flat bed without using bedrails?: None ?Help  needed moving from lying on your back to sitting on the side of a flat bed without using bedrails?: None ?Help needed moving to and from a bed to a chair (including a wheelchair)?: None ?Help needed standing up from a chair using your arms (e.g., wheelchair or bedside chair)?: None ?Help needed to walk in hospital room?: None ?Help needed climbing 3-5 steps with a railing? : None ?6 Click Score: 24 ? ?  ?End of Session   ?Activity Tolerance: Patient tolerated treatment well ?Patient left: Other (comment) (in w/c for MRI transport) ?Nurse Communication: Mobility status ?PT Visit Diagnosis: Muscle weakness (generalized) (M62.81) ?  ? ?Time: 3299-2426 ?PT Time Calculation (min) (ACUTE ONLY): 10 min ? ? ?Charges:   PT Evaluation ?$PT Eval Low Complexity: 1 Low ?  ?  ?   ? ? ?Yanilen Adamik A. Gilford Rile, PT, DPT ?Acute Rehabilitation Services ?Pager [phone] ?Office [phone] ? ? ?Romelle Muldoon A Raistlin Gum ?04/29/2021, 4:13 PM ? ?

## 2021-04-29 NOTE — Assessment & Plan Note (Signed)
-  Continue Restasis ?

## 2021-04-29 NOTE — Assessment & Plan Note (Addendum)
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

## 2021-04-29 NOTE — H&P (Signed)
?History and Physical  ? ? ?Patient: Joanne Jackson WSF:[id number] DOB: [birth year] ?DOA: 04/28/2021 ?DOS: the patient was seen and examined on 04/29/2021 ?PCP: Leamon Arnt, MD  ?Patient coming from: Home - lives with husband and daughter; Donald Prose:  Husband, [phone] ? ? ?Chief Complaint: Stroke-like symptoms ? ?HPI: Dezaree Tracey is a 46 y.o. female with medical history significant of Sjogren's, HTN, and pre-diabetes presenting with stroke-like symptoms. Thursday AM, she awoke with a severe headache.  She went to work and took ibuprofen but she felt nauseated.  She went to her car and took a nap and eventually called Dr. Tamela Oddi office for an appointment; she saw the NP that afternoon.  Her BP was ok but she vomited at the office.  She went home, took a nap, and felt ok. She felt ok through the day yesterday but last night she noticed her vision changing - this also happened Thursday with peripheral vision decreased bilaterally.  Yesterday she saw wavy lines out of the right eye pierpherally.  She had difficulty expressing herself and so decided to go to the ER.  Symptoms resolved completely - vision changes lasted in total maybe 20 minutes, headache was present all morning Thursday AM but resolved and none present Friday.  She feels fine now but sleepy.  A week ago she noticed her heels hurting is unsure if this was related.   ? ? ? ?ER Course:  [facility] transfer, per Dr. Nevada Crane: ? ?2 days history of diffuse headaches, blurry vision, peripheral visual loss that lasted 1 to 2 hours.  She saw her PCP yesterday.  Today she had recurrence of her blurry vision, her headache was mild, additionally she had aphasia and difficulty with word finding, lasted about 20 minutes.  At the time of her presentation to the ED her symptoms had resolved.  CT angio head and neck was negative for any large vessel occlusion.  EDP, Dr. Alvino Chapel, requesting admission for TIA work-up.  ? ? ? ? ?Review of Systems: As mentioned in the  history of present illness. All other systems reviewed and are negative. ?Past Medical History:  ?Diagnosis Date  ? Hypertension   ? Pre-diabetes   ? 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[place])   ? ?Past Surgical History:  ?Procedure Laterality Date  ? CESAREAN SECTION    ? DILATION AND CURETTAGE, DIAGNOSTIC / THERAPEUTIC    ? ?Social History:  reports that she has never smoked. She has never used smokeless tobacco. She reports that she does not currently use alcohol. She reports that she does not use drugs. ? ?No Known Allergies ? ?Family History  ?Problem Relation Age of Onset  ? Thyroid disease Mother   ? Heart attack Father 36  ? Hypertension Father   ? Healthy Brother   ? Healthy Daughter   ? Healthy Paternal Grandfather   ? Colon cancer Neg Hx   ? Cancer Neg Hx   ? Stroke Neg Hx   ? ? ?Prior to Admission medications   ?Medication Sig Start Date End Date Taking? Authorizing Provider  ?B Complex Vitamins (VITAMIN B COMPLEX PO) Take by mouth.    [provider]  ?Cholecalciferol (VITAMIN D3) 50 MCG (2000 UT) TABS Take 2,000 Units by mouth daily as needed.     [provider]  ?ferrous sulfate 324 (65 Fe) MG TBEC TAKE 1 TABLET BY MOUTH EVERY DAY 09/06/20   Leamon Arnt, MD  ?lisinopril (ZESTRIL) 40 MG tablet Take 1 tablet (40 mg  total) by mouth daily. 02/27/21   Leamon Arnt, MD  ?ondansetron (ZOFRAN) 4 MG tablet Take 1 tablet (4 mg total) by mouth every 8 (eight) hours as needed for nausea or vomiting. 04/27/21   Jeanie Sewer, NP  ?RESTASIS 0.05 % ophthalmic emulsion  01/25/20   [provider]  ? ? ?Physical Exam: ?Vitals:  ? 04/29/21 0800 04/29/21 1100 04/29/21 1354 04/29/21 1532  ?BP: 105/69 95/66 110/75 119/76  ?Pulse: (!) 59 68 78 63  ?Resp: '18 12 18 18  '$ ?Temp:   99.1 ?F (37.3 ?C) 97.9 ?F (36.6 ?C)  ?TempSrc:   Oral Oral  ?SpO2: 96% 97% 98% 98%  ?Weight:      ?Height:      ? ?General:  Appears calm and comfortable and is in NAD ?Eyes:  EOMI, normal lids,  iris ?ENT:  grossly normal hearing, lips & tongue, mmm; appropriate dentition ?Neck:  no LAD, masses or thyromegaly; no carotid bruits ?Cardiovascular:  RRR, no m/r/g. No LE edema.  ?Respiratory:   CTA bilaterally with no wheezes/rales/rhonchi.  Normal respiratory effort. ?Abdomen:  soft, NT, ND ?Skin:  no rash or induration seen on limited exam ?Musculoskeletal:  grossly normal tone BUE/BLE, good ROM, no bony abnormality ?Psychiatric:  grossly normal mood and affect, speech fluent and appropriate, AOx3 ?Neurologic:  CN 2-12 grossly intact, moves all extremities in coordinated fashion, sensation intact ? ? ?Radiological Exams on Admission: ?Independently reviewed - see discussion in A/P where applicable ? ?CT ANGIO HEAD NECK W WO CM ? ?Result Date: 04/28/2021 ?CLINICAL DATA:  Acute neurologic deficit EXAM: CT ANGIOGRAPHY HEAD AND NECK TECHNIQUE: Multidetector CT imaging of the head and neck was performed using the standard protocol during bolus administration of intravenous contrast. Multiplanar CT image reconstructions and MIPs were obtained to evaluate the vascular anatomy. Carotid stenosis measurements (when applicable) are obtained utilizing NASCET criteria, using the distal internal carotid diameter as the denominator. RADIATION DOSE REDUCTION: This exam was performed according to the departmental dose-optimization program which includes automated exposure control, adjustment of the mA and/or kV according to patient size and/or use of iterative reconstruction technique. CONTRAST:  85m OMNIPAQUE IOHEXOL 350 MG/ML SOLN COMPARISON:  None. FINDINGS: CTA NECK FINDINGS SKELETON: There is no bony spinal canal stenosis. No lytic or blastic lesion. OTHER NECK: Normal pharynx, larynx and major salivary glands. No cervical lymphadenopathy. Unremarkable thyroid gland. UPPER CHEST: No pneumothorax or pleural effusion. No nodules or masses. AORTIC ARCH: There is no calcific atherosclerosis of the aortic arch. There is no  aneurysm, dissection or hemodynamically significant stenosis of the visualized portion of the aorta. Conventional 3 vessel aortic branching pattern. The visualized proximal subclavian arteries are widely patent. RIGHT CAROTID SYSTEM: Normal without aneurysm, dissection or stenosis. LEFT CAROTID SYSTEM: Normal without aneurysm, dissection or stenosis. VERTEBRAL ARTERIES: Left dominant configuration. Both origins are clearly patent. There is no dissection, occlusion or flow-limiting stenosis to the skull base (V1-V3 segments). CTA HEAD FINDINGS POSTERIOR CIRCULATION: --Vertebral arteries: Normal V4 segments. --Inferior cerebellar arteries: Normal. --Basilar artery: Normal. --Superior cerebellar arteries: Normal. --Posterior cerebral arteries (PCA): Normal. ANTERIOR CIRCULATION: --Intracranial internal carotid arteries: Normal. --Anterior cerebral arteries (ACA): Normal. Both A1 segments are present. Patent anterior communicating artery (a-comm). --Middle cerebral arteries (MCA): Normal. VENOUS SINUSES: As permitted by contrast timing, patent. ANATOMIC VARIANTS: None Review of the MIP images confirms the above findings. IMPRESSION: Normal CTA of the head and neck. Electronically Signed   By: KUlyses JarredM.D.   On: 04/28/2021 22:24  ? ?CT  HEAD WO CONTRAST ? ?Result Date: 04/28/2021 ?CLINICAL DATA:  Headache. EXAM: CT HEAD WITHOUT CONTRAST TECHNIQUE: Contiguous axial images were obtained from the base of the skull through the vertex without intravenous contrast. RADIATION DOSE REDUCTION: This exam was performed according to the departmental dose-optimization program which includes automated exposure control, adjustment of the mA and/or kV according to patient size and/or use of iterative reconstruction technique. COMPARISON:  None. FINDINGS: Brain: No evidence of acute infarction, hemorrhage, hydrocephalus, extra-axial collection or mass lesion/mass effect. Vascular: No hyperdense vessel or unexpected calcification.  Skull: Normal. Negative for fracture or focal lesion. Sinuses/Orbits: No acute finding. Other: None. IMPRESSION: No acute intracranial pathology. Electronically Signed   By: Virgina Norfolk M.D.   On: 04/28/2021 20

## 2021-04-29 NOTE — Consult Note (Signed)
?                    NEURO HOSPITALIST CONSULT NOTE  ? ?Requesting physician: Dr. Lorin Mercy ? ?Reason for Consult: Right frontal lobe mass on MRI ? ?History obtained from:  Patient, Husband and Chart    ? ?HPI:                                                                                                                                         ? Joanne Jackson is an 46 y.o. female with a PMHx of Sjogren's syndrome, HTN and prediabetes who presented for evaluation of severe headache and decreased peripheral vision bilaterally. An MRI brain was obtained, revealing a medium-sized mass lesion in the anterior right frontal lobe involving both the cortex and the subcortical white matter; add-on post-contrast study revealed no enhancement of the lesion.  ? ?Dr. Lorin Mercy' HPI has been reviewed: "Joanne Jackson is a 46 y.o. female with medical history significant of Sjogren's, HTN, and pre-diabetes presenting with stroke-like symptoms. Thursday AM, she awoke with a severe headache.  She went to work and took ibuprofen but she felt nauseated.  She went to her car and took a nap and eventually called Dr. Tamela Oddi office for an appointment; she saw the NP that afternoon.  Her BP was ok but she vomited at the office.  She went home, took a nap, and felt ok. She felt ok through the day yesterday but last night she noticed her vision changing - this also happened Thursday with peripheral vision decreased bilaterally.  Yesterday she saw wavy lines out of the right eye pierpherally.  She had difficulty expressing herself and so decided to go to the ER.  Symptoms resolved completely - vision changes lasted in total maybe 20 minutes, headache was present all morning Thursday AM but resolved and none present Friday.  She feels fine now but sleepy.  A week ago she noticed her heels hurting is unsure if this was related." "2 days history of diffuse headaches, blurry vision, peripheral visual loss that lasted 1 to 2 hours.  She saw her PCP yesterday.   Today she had recurrence of her blurry vision, her headache was mild, additionally she had aphasia and difficulty with word finding, lasted about 20 minutes.  At the time of her presentation to the ED her symptoms had resolved.  CT angio head and neck was negative for any large vessel occlusion.  EDP, Dr. Alvino Chapel, requesting admission for TIA work-up." ? ?Past Medical History:  ?Diagnosis Date  ? Hypertension   ? Pre-diabetes   ? 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 ? ?Past Surgical History:  ?Procedure Laterality Date  ? CESAREAN SECTION    ? DILATION AND CURETTAGE, DIAGNOSTIC / THERAPEUTIC    ? ? ?Family History  ?Problem Relation Age of Onset  ? Thyroid disease Mother   ?  Heart attack Father 43  ? Hypertension Father   ? Healthy Brother   ? Healthy Daughter   ? Healthy Paternal Grandfather   ? Colon cancer Neg Hx   ? Cancer Neg Hx   ? Stroke Neg Hx   ?           ? ?Social History:  reports that she has never smoked. She has never used smokeless tobacco. She reports that she does not currently use alcohol. She reports that she does not use drugs. ? ?No Known Allergies ? ?MEDICATIONS:                                                                                                                     ?Prior to Admission:  ?Medications Prior to Admission  ?Medication Sig Dispense Refill Last Dose  ? Cholecalciferol (VITAMIN D3) 50 MCG (2000 UT) TABS Take 2,000 Units by mouth daily as needed.    04/28/2021  ? ferrous sulfate 324 (65 Fe) MG TBEC TAKE 1 TABLET BY MOUTH EVERY DAY (Patient taking differently: Take 324 mg by mouth daily.) 90 tablet 1 04/28/2021  ? lisinopril (ZESTRIL) 40 MG tablet Take 1 tablet (40 mg total) by mouth daily. 90 tablet 3 04/28/2021  ? ondansetron (ZOFRAN) 4 MG tablet Take 1 tablet (4 mg total) by mouth every 8 (eight) hours as needed for nausea or vomiting. 20 tablet 0 04/27/2021  ? RESTASIS 0.05 % ophthalmic emulsion Place 1 drop into both eyes 2 (two) times daily.    04/28/2021  ? ?Scheduled: ? aspirin EC  81 mg Oral Daily  ? cycloSPORINE  1 drop Both Eyes BID  ? enoxaparin (LOVENOX) injection  40 mg Subcutaneous Q24H  ? insulin aspart  0-15 Units Subcutaneous TID WC  ? ? ? ?ROS:                                                                                                                                       ?As per HPI. Denies any additional symptoms.  ? ? ?Blood pressure 119/76, pulse 63, temperature 97.9 ?F (36.6 ?C), temperature source Oral, resp. rate 18, height '5\' 1"'$  (1.549 m), weight 106.9 kg, last menstrual period 04/12/2021, SpO2 98 %. ? ? ?General Examination:                                                                                                      ? ?  Physical Exam  ?HEENT-  [place]/AT    ?Lungs- Respirations unlabored ?Extremities- No edema ? ?Neurological Examination ?Mental Status: Awake and alert. Oriented x 5. Speech fluent with intact comprehension and naming. Pleasant and cooperative. Good insight.  ?Cranial Nerves: ?II: Temporal visual fields intact with no extinction to DSS. PERRL.   ?III,IV, VI: No ptosis. EOMI. No nystagmus.  ?V: Temp sensation equal bilaterally  ?VII: Smile symmetric ?VIII: Hearing intact to voice ?IX,X: No hoarseness ?XI: Symmetric shoulder shrug ?XII: Midline tongue extension ?Motor: ?BUE 5/5 proximally and distally ?BLE 5/5 proximally and distally  ?No pronator drift.  ?Orbiting fingers test is negative.  ?Sensory: Temp and light touch intact throughout, bilaterally. No extinction to DSS.  ?Deep Tendon Reflexes: 2+ and symmetric throughout ?Cerebellar: No ataxia with FNF or H-S bilaterally  ?Gait: Deferred ? ?  ?Lab Results: ?Basic Metabolic Panel: ?Recent Labs  ?Lab 04/28/21 ?1932  ?NA 135  ?K 4.1  ?CL 102  ?CO2 24  ?GLUCOSE 135*  ?BUN 19  ?CREATININE 0.86  ?CALCIUM 9.9  ? ? ?CBC: ?Recent Labs  ?Lab 04/28/21 ?1932  ?WBC 6.0  ?NEUTROABS 3.3  ?HGB 13.2  ?HCT 40.3  ?MCV 86.7  ?PLT 223  ? ? ?Cardiac Enzymes: ?No results for  input(s): CKTOTAL, CKMB, CKMBINDEX, TROPONINI in the last 168 hours. ? ?Lipid Panel: ?No results for input(s): CHOL, TRIG, HDL, CHOLHDL, VLDL, LDLCALC in the last 168 hours. ? ?Imaging: ?CT ANGIO HEAD NECK W WO CM ? ?Result Date: 04/28/2021 ?CLINICAL DATA:  Acute neurologic deficit EXAM: CT ANGIOGRAPHY HEAD AND NECK TECHNIQUE: Multidetector CT imaging of the head and neck was performed using the standard protocol during bolus administration of intravenous contrast. Multiplanar CT image reconstructions and MIPs were obtained to evaluate the vascular anatomy. Carotid stenosis measurements (when applicable) are obtained utilizing NASCET criteria, using the distal internal carotid diameter as the denominator. RADIATION DOSE REDUCTION: This exam was performed according to the departmental dose-optimization program which includes automated exposure control, adjustment of the mA and/or kV according to patient size and/or use of iterative reconstruction technique. CONTRAST:  11m OMNIPAQUE IOHEXOL 350 MG/ML SOLN COMPARISON:  None. FINDINGS: CTA NECK FINDINGS SKELETON: There is no bony spinal canal stenosis. No lytic or blastic lesion. OTHER NECK: Normal pharynx, larynx and major salivary glands. No cervical lymphadenopathy. Unremarkable thyroid gland. UPPER CHEST: No pneumothorax or pleural effusion. No nodules or masses. AORTIC ARCH: There is no calcific atherosclerosis of the aortic arch. There is no aneurysm, dissection or hemodynamically significant stenosis of the visualized portion of the aorta. Conventional 3 vessel aortic branching pattern. The visualized proximal subclavian arteries are widely patent. RIGHT CAROTID SYSTEM: Normal without aneurysm, dissection or stenosis. LEFT CAROTID SYSTEM: Normal without aneurysm, dissection or stenosis. VERTEBRAL ARTERIES: Left dominant configuration. Both origins are clearly patent. There is no dissection, occlusion or flow-limiting stenosis to the skull base (V1-V3 segments).  CTA HEAD FINDINGS POSTERIOR CIRCULATION: --Vertebral arteries: Normal V4 segments. --Inferior cerebellar arteries: Normal. --Basilar artery: Normal. --Superior cerebellar arteries: Normal. --Posterior cerebral

## 2021-04-29 NOTE — ED Notes (Addendum)
Rounded on pt. Pt provide update and questions/ concerns addressed  ? ?VSS; will continue to monitor  ?

## 2021-04-29 NOTE — ED Notes (Signed)
Rounded on pt. Pt lying in bed with eyes close. Family at bedside. VSS; will continue to monitor  ?

## 2021-04-29 NOTE — Plan of Care (Signed)

## 2021-04-30 ENCOUNTER — Encounter: Payer: Self-pay | Admitting: Family Medicine

## 2021-04-30 DIAGNOSIS — D496 Neoplasm of unspecified behavior of brain: Secondary | ICD-10-CM

## 2021-04-30 DIAGNOSIS — C719 Malignant neoplasm of brain, unspecified: Secondary | ICD-10-CM

## 2021-04-30 DIAGNOSIS — E785 Hyperlipidemia, unspecified: Secondary | ICD-10-CM

## 2021-04-30 DIAGNOSIS — R2990 Unspecified symptoms and signs involving the nervous system: Secondary | ICD-10-CM | POA: Diagnosis not present

## 2021-04-30 LAB — HIV ANTIBODY (ROUTINE TESTING W REFLEX): HIV Screen 4th Generation wRfx: NONREACTIVE

## 2021-04-30 LAB — GLUCOSE, CAPILLARY
Glucose-Capillary: 113 mg/dL — ABNORMAL HIGH (ref 70–99)
Glucose-Capillary: 120 mg/dL — ABNORMAL HIGH (ref 70–99)

## 2021-04-30 MED ORDER — LEVETIRACETAM 500 MG PO TABS: 500.0000 mg | ORAL_TABLET | Freq: Two times a day (BID) | ORAL | 0 refills | Status: DC

## 2021-04-30 MED ORDER — BUTALBITAL-APAP-CAFFEINE 50-325-40 MG PO TABS: 1.0000 | ORAL_TABLET | Freq: Every day | ORAL | 0 refills | Status: DC | PRN

## 2021-04-30 MED ORDER — LEVETIRACETAM 500 MG PO TABS
500.0000 mg | ORAL_TABLET | Freq: Once | ORAL | Status: AC
  Administered 2021-04-30: 500 mg via ORAL
  Filled 2021-04-30: qty 1

## 2021-04-30 NOTE — Assessment & Plan Note (Addendum)
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 Marcello Moores, discussion at multidisciplinary neuro-oncology conference.  Stable for discharge with outpatient follow up.  ?Discharge on keppra BID ? ?

## 2021-04-30 NOTE — Hospital Course (Signed)
46 yo with hx sjogrens, HTN, prediabetes who presented with symptoms of HA, nausea/vomiting, vision changes, and then speech issues.  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  Seen by neurology and neurosurgery.  Neurosurgery recommended outpatient follow up and discharge with keppra 500 mg BID. ? ?See below for additional details ?

## 2021-04-30 NOTE — Progress Notes (Signed)
Patient discharged home via wheelchair with husband.  

## 2021-04-30 NOTE — Assessment & Plan Note (Signed)
Continue lisinopril

## 2021-04-30 NOTE — Progress Notes (Signed)
Discharge teaching complete. Meds, diet, activity, follow up appointments reviewed and all questions answered. Copy of instructions given to patient and prescriptions sent to pharmacy.  

## 2021-04-30 NOTE — Assessment & Plan Note (Signed)
a1c 5.9 ?

## 2021-04-30 NOTE — Consult Note (Signed)
?Chief Complaint  ? ?Chief Complaint  ?Patient presents with  ? Aphasia  ? ? ?History of Present Illness  ?Joanne Jackson is a 46 y.o. female seen in inpatient consultation for recent MRI discovery of likely low-grade glioma.  Briefly, patient history begins this past Thursday when she had sudden onset of headache and some blurry vision.  The visual change lasted for a few minutes, however she went to her primary care physician's office where she apparently vomited.  Ultimately after going back home she took a nap and felt better.  The next day while at work she had an episode of blurry vision associated with about 1 to 2 minutes of speech difficulty.  No headache at that time.  She was therefore seen at urgent care where CT scan was done which was negative however out of concern more for TIA she was referred to [facility] for [facility].  This revealed an expansile lesion within the superior frontal gyrus on the right side.  Subsequent contrast-enhanced MRI revealed no significant enhancement of this lesion.  Neurosurgical consultation was requested. ? ?Of note, the patient reports a history of hypertension and prediabetes.  She also has a history of Sjogren's disease.  No other known medical history.  She is not on any blood thinners or antiplatelet agents.  She is a non-smoker. ? ?Past Medical History  ? ?Past Medical History:  ?Diagnosis Date  ? Hypertension   ? Pre-diabetes   ? 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 ? ?Past Surgical History  ? ?Past Surgical History:  ?Procedure Laterality Date  ? CESAREAN SECTION    ? DILATION AND CURETTAGE, DIAGNOSTIC / THERAPEUTIC    ? ? ?Social History  ? ?Social History  ? ?Tobacco Use  ? Smoking status: Never  ? Smokeless tobacco: Never  ?Vaping Use  ? Vaping Use: Never used  ?Substance Use Topics  ? Alcohol use: Not Currently  ? Drug use: Never  ? ? ?Medications  ? ?Prior to Admission medications   ?Medication Sig Start Date End Date Taking? Authorizing Provider   ?Cholecalciferol (VITAMIN D3) 50 MCG (2000 UT) TABS Take 2,000 Units by mouth daily as needed.    Yes [provider]  ?ferrous sulfate 324 (65 Fe) MG TBEC TAKE 1 TABLET BY MOUTH EVERY DAY ?Patient taking differently: Take 324 mg by mouth daily. 09/06/20  Yes Leamon Arnt, MD  ?lisinopril (ZESTRIL) 40 MG tablet Take 1 tablet (40 mg total) by mouth daily. 02/27/21  Yes Leamon Arnt, MD  ?ondansetron (ZOFRAN) 4 MG tablet Take 1 tablet (4 mg total) by mouth every 8 (eight) hours as needed for nausea or vomiting. 04/27/21  Yes Hudnell, Colletta [place], NP  ?RESTASIS 0.05 % ophthalmic emulsion Place 1 drop into both eyes 2 (two) times daily. 01/25/20  Yes [provider]  ? ? ?Allergies  ?No Known Allergies ? ?Review of Systems  ?ROS ? ?Neurologic Exam  ?Awake, alert, oriented ?Memory and concentration grossly intact ?Speech fluent, appropriate ?CN grossly intact ?Motor exam: ?Upper Extremities Deltoid Bicep Tricep Grip  ?Right 5/5 5/5 5/5 5/5  ?Left 5/5 5/5 5/5 5/5  ? ?Lower Extremities IP Quad PF DF EHL  ?Right 5/5 5/5 5/5 5/5 5/5  ?Left 5/5 5/5 5/5 5/5 5/5  ? ?Sensation grossly intact to LT ?No pronator drift ? ?Imaging  ?MRI of the brain with and without contrast was personally reviewed.  This demonstrates expansile flair lesion involving the posterior aspect of the superior frontal gyrus.  This does not appear to extend into the precentral gyrus.  No significant local mass effect.  No hydrocephalus.  There is no contrast-enhancement. ? ?Impression  ?- 46 y.o. female presenting with episodes of headache and transient blurry vision and speech difficulty.  Etiology remains unclear, possibly migraine-like headache induced by the presence of the frontal lesion, versus possible seizure. ? 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  Given her young age and the location I suspect that excisional biopsy would be reasonable.  I ? ?Plan  ?-I will have the patient started on Keppra 500 mg p.o.  twice daily ?-I have spoken to my partner, Dr. Marcello Moores who has agreed to take over this patient's care. ?-I will plan on having this patient's case discussed at tomorrow's multidisciplinary neuro-oncology conference ?-Patient is stable for discharge home from a neurosurgical standpoint.  I will arrange for her to follow-up with Dr. Marcello Moores in our clinic this coming week. ? ?I have reviewed the situation with the patient and her husband at bedside.  We have discussed the imaging findings and the likely diagnoses above.  We did also review the plan above with the possible need for surgical resection.  All their questions today were answered.  They are agreeable to the plan above. ? ? ?Consuella Lose, MD ?[facility]  ? ?

## 2021-04-30 NOTE — Discharge Summary (Addendum)
Physician Discharge Summary  ?Joanne Jackson ZOX:[id number] DOB: 12-14-1975 DOA: 04/28/2021 ? ?PCP: Joanne Arnt, MD ? ?Admit date: 04/28/2021 ?Discharge date: 04/30/2021 ? ?Time spent: 40 minutes ? ?Recommendations for Outpatient Follow-up:  ?Follow CBC/CMP  ?Follow with neurosurgery outpatient for planning for further diagnostic/treatment planning ? ?Was having lightheadedness related to keppra.  Discussed options including discontinuing.  They're going to try to take 1/2 dose for next day or so and uptitrate to goal.  Ok with discontinuing if continued sx and following with Joanne Jackson outpatient (discussed with Dr. Kathyrn Sheriff). ? ?Discharge Diagnoses:  ?Principal Problem: ?  Episode of transient neurologic symptoms ?Active Problems: ?  Brain tumor (Joanne Jackson) ?  Essential hypertension ?  Prediabetes ?  Dyslipidemia ?  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Joanne Jackson) ? ? ?Discharge Condition: stable ? ?Diet recommendation: heart healthy ? ?Filed Weights  ? 04/28/21 1919  ?Weight: 106.9 kg  ? ? ?History of present illness:  ?46 yo with hx sjogrens, HTN, prediabetes who presented with symptoms of HA, nausea/vomiting, vision changes, and then speech issues.  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  Seen by neurology and neurosurgery.  Neurosurgery recommended outpatient follow up and discharge with keppra 500 mg BID. ? ?See below for additional details ? ?Hospital Course:  ?Assessment and Plan: ?* Episode of transient neurologic symptoms ?HA with nausea, vision changes, vomiting, speech issues ?Appreciate neurology recommendations, complex migraine highest on ddx (masses can trigger migraines per neuro), nsgy also mentions possibility of seizure ?CT head without acute intracranial pathology ?CTA head/neck normal ?MRI brain with expansile flair  signal abnormality in the r superior frontal gyrus concerning for underlying mass lesion ?MRI with contrast concerning for tumor, most likely low grade glioma ?Will arrange outpatient f/u with nsgy for follow up ?NSGY to discuss at multidisciplinary neuro oncology conference ? ?Brain tumor (Joanne Jackson) ?MRI with findings concerning for tumor, most likely low grade glioma ?Neurosurgery recommending follow up with Joanne Jackson, discussion at multidisciplinary neuro-oncology conference.  Stable for discharge with outpatient follow up.  ?Discharge on keppra BID ? ? ?Prediabetes ?a1c 5.9 ? ?Essential hypertension ?Continue lisinopril ? ?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Joanne Jackson) ?-Continue Restasis ? ? ? ?Procedures: ?none  ? ?Consultations: ?Nsgy ?neurology ? ?Discharge Exam: ?Vitals:  ? 04/30/21 0737 04/30/21 1143  ?BP: 117/74 125/82  ?Pulse: 62 78  ?Resp: 18   ?Temp: 99 ?F (37.2 ?C) 98.7 ?F (37.1 ?C)  ?SpO2: 98% 97%  ? ?Feels back to herself ?Husband at bedside ? ?General: No acute distress. ?Cardiovascular: Heart sounds show Joanne Jackson regular rate, and rhythm ?Lungs: Clear to auscultation bilaterally  ?Abdomen: Soft, nontender, nondistended  ?Neurological: Alert and oriented ?3. Moves all extremities ?4 with equal strength. Cranial nerves II through XII intact. ?Skin: Warm and dry. No rashes or lesions. ?Extremities: No clubbing or cyanosis. No edema.  ? ?Discharge Instructions ? ? ?Discharge Instructions   ? ? Call MD for:  difficulty breathing, headache or visual disturbances   Complete by: As directed ?  ? Call MD for:  extreme fatigue   Complete by: As directed ?  ? Call MD for:  hives   Complete by: As directed ?  ? Call MD for:  persistant dizziness or light-headedness   Complete by: As directed ?  ? Call MD for:  persistant nausea and vomiting  Complete by: As directed ?  ? Call MD for:  redness, tenderness, or signs of infection (pain, swelling, redness, odor or green/yellow discharge around incision site)    Complete by: As directed ?  ? Call MD for:  severe uncontrolled pain   Complete by: As directed ?  ? Call MD for:  temperature >100.4   Complete by: As directed ?  ? Diet - low sodium heart healthy   Complete by: As directed ?  ? Discharge instructions   Complete by: As directed ?  ? You were seen for headache, nausea, vomiting, vision changes and speech issues. ? ?You were found to having imaging findings concerning for Joanne Jackson brain tumor (concerning for low grade glioma). ? ?You'll follow up with Joanne Jackson from neurosurgery as an outpatient to discuss further diagnosis and treatment plans. ? ?We'll send you home with keppra which is Joanne Jackson medicine to prevent seizures.   ? ?I'll send you with Joanne Jackson few doses of fioricet which you can use as needed for headaches (try to use this infrequently).  ? ?Return for new, recurrent, or worsening symptoms. ? ?Please ask your PCP to request records from this hospitalization so they know what was done and what the next steps will be.  ? Increase activity slowly   Complete by: As directed ?  ? ?  ? ?Allergies as of 04/30/2021   ?No Known Allergies ?  ? ?  ?Medication List  ?  ? ?TAKE these medications   ? ?butalbital-acetaminophen-caffeine 50-325-40 MG tablet ?Commonly known as: FIORICET ?Take 1-2 tablets by mouth daily as needed for headache. ?  ?ferrous sulfate 324 (65 Fe) MG Tbec ?TAKE 1 TABLET BY MOUTH EVERY DAY ?What changed: how much to take ?  ?levETIRAcetam 500 MG tablet ?Commonly known as: KEPPRA ?Take 1 tablet (500 mg total) by mouth 2 (two) times daily for 30 doses. ?  ?lisinopril 40 MG tablet ?Commonly known as: ZESTRIL ?Take 1 tablet (40 mg total) by mouth daily. ?  ?ondansetron 4 MG tablet ?Commonly known as: Zofran ?Take 1 tablet (4 mg total) by mouth every 8 (eight) hours as needed for nausea or vomiting. ?  ?Restasis 0.05 % ophthalmic emulsion ?Generic drug: cycloSPORINE ?Place 1 drop into both eyes 2 (two) times daily. ?  ?Vitamin D3 50 MCG (2000 UT) Tabs ?Take 2,000 Units  by mouth daily as needed. ?  ? ?  ? ?No Known Allergies ? Follow-up Information   ? ? Vallarie Mare, MD Follow up.   ?Specialty: Neurosurgery ?Why: you should get Ivania Teagarden phone call for Ely Ballen follow up appointment, call if you don't hear from neurosurgery ?Contact information: ?[address] ?[address] ?[place] [address] ?[phone] ? ? ?  ?  ? ?  ?  ? ?  ? ? ? ?The results of significant diagnostics from this hospitalization (including imaging, microbiology, ancillary and laboratory) are listed below for reference.   ? ?Significant Diagnostic Studies: ?CT ANGIO HEAD NECK W WO CM ? ?Result Date: 04/28/2021 ?CLINICAL DATA:  Acute neurologic deficit EXAM: CT ANGIOGRAPHY HEAD AND NECK TECHNIQUE: Multidetector CT imaging of the head and neck was performed using the standard protocol during bolus administration of intravenous contrast. Multiplanar CT image reconstructions and MIPs were obtained to evaluate the vascular anatomy. Carotid stenosis measurements (when applicable) are obtained utilizing NASCET criteria, using the distal internal carotid diameter as the denominator. RADIATION DOSE REDUCTION: This exam was performed according to the departmental dose-optimization program which includes automated exposure  control, adjustment of the mA and/or kV according to patient size and/or use of iterative reconstruction technique. CONTRAST:  7m OMNIPAQUE IOHEXOL 350 MG/ML SOLN COMPARISON:  None. FINDINGS: CTA NECK FINDINGS SKELETON: There is no bony spinal canal stenosis. No lytic or blastic lesion. OTHER NECK: Normal pharynx, larynx and major salivary glands. No cervical lymphadenopathy. Unremarkable thyroid gland. UPPER CHEST: No pneumothorax or pleural effusion. No nodules or masses. AORTIC ARCH: There is no calcific atherosclerosis of the aortic arch. There is no aneurysm, dissection or hemodynamically significant stenosis of the visualized portion of the aorta. Conventional 3 vessel aortic branching pattern. The  visualized proximal subclavian arteries are widely patent. RIGHT CAROTID SYSTEM: Normal without aneurysm, dissection or stenosis. LEFT CAROTID SYSTEM: Normal without aneurysm, dissection or stenosis. VERTEBRAL

## 2021-05-01 ENCOUNTER — Encounter: Payer: Self-pay | Admitting: Rheumatology

## 2021-05-01 ENCOUNTER — Other Ambulatory Visit: Payer: Self-pay | Admitting: Radiation Therapy

## 2021-05-06 HISTORY — PX: BRAIN SURGERY: SHX531

## 2021-05-08 ENCOUNTER — Other Ambulatory Visit: Payer: Self-pay | Admitting: Neurosurgery

## 2021-05-08 DIAGNOSIS — C719 Malignant neoplasm of brain, unspecified: Secondary | ICD-10-CM

## 2021-05-09 ENCOUNTER — Ambulatory Visit: Payer: 59 | Admitting: Rheumatology

## 2021-05-09 DIAGNOSIS — M3501 Sicca syndrome with keratoconjunctivitis: Secondary | ICD-10-CM

## 2021-05-09 DIAGNOSIS — R202 Paresthesia of skin: Secondary | ICD-10-CM

## 2021-05-09 DIAGNOSIS — R5383 Other fatigue: Secondary | ICD-10-CM

## 2021-05-09 DIAGNOSIS — G8929 Other chronic pain: Secondary | ICD-10-CM

## 2021-05-09 DIAGNOSIS — D126 Benign neoplasm of colon, unspecified: Secondary | ICD-10-CM

## 2021-05-09 DIAGNOSIS — R7303 Prediabetes: Secondary | ICD-10-CM

## 2021-05-09 DIAGNOSIS — I10 Essential (primary) hypertension: Secondary | ICD-10-CM

## 2021-05-09 DIAGNOSIS — E559 Vitamin D deficiency, unspecified: Secondary | ICD-10-CM

## 2021-05-10 ENCOUNTER — Encounter: Payer: Self-pay | Admitting: Radiation Therapy

## 2021-05-10 NOTE — Progress Notes (Signed)
Pt has decided to pursue treatment and surgery at [facility].  ? ?Mont Dutton R.T.(R)(T) ?Radiation Special Procedures Navigator  ?-------------------------------------------------------------------------------------------------- ?Message sent from [facility] surgical scheduler:  ? ? ?Hi Nikki, ? ?Thank you so much for reaching out. I'm so sorry for forgetting to returning your call. As you know, brain tumor is a big shock to me and my family. A lot has been going on lately. ? ?We just decided to get the treatment from [facility]. We really appreciate the care from Dr. Marcello Moores and your center. Everyone is very nice to [place]. ? ?Thank you! ? ?Best Regards, ? ?[name] ? ?On Wed, May 10, 2021 at 2:52 PM Fayette Pho [email or web address]> wrote: ?I have been trying to get in contact with you regarding scheduling your surgery.  Please contact our office for scheduling. ?  ?Thank you, ?  ?Fayette Pho ?Surgical Scheduler ?[phone] ?Fax [phone] ? ?

## 2021-05-18 DIAGNOSIS — C719 Malignant neoplasm of brain, unspecified: Secondary | ICD-10-CM

## 2021-05-18 HISTORY — DX: Malignant neoplasm of brain, unspecified: C71.9

## 2021-06-02 ENCOUNTER — Other Ambulatory Visit: Payer: Self-pay | Admitting: Radiation Therapy

## 2021-06-02 ENCOUNTER — Ambulatory Visit
Admission: RE | Admit: 2021-06-02 | Discharge: 2021-06-02 | Disposition: A | Discharge status: Home / Self Care | Payer: Self-pay | Source: Ambulatory Visit | Attending: Internal Medicine | Admitting: Internal Medicine

## 2021-06-02 DIAGNOSIS — C719 Malignant neoplasm of brain, unspecified: Secondary | ICD-10-CM

## 2021-06-05 ENCOUNTER — Inpatient Hospital Stay: Payer: 59

## 2021-06-05 ENCOUNTER — Encounter: Payer: 59 | Admitting: Occupational Therapy

## 2021-06-05 ENCOUNTER — Telehealth: Payer: Self-pay | Admitting: Internal Medicine

## 2021-06-05 ENCOUNTER — Ambulatory Visit: Payer: 59 | Admitting: Physical Therapy

## 2021-06-05 ENCOUNTER — Telehealth: Payer: Self-pay | Admitting: Radiation Therapy

## 2021-06-05 NOTE — Progress Notes (Incomplete)
?Radiation Oncology         [phone]) [phone] ?________________________________ ? ?Name: Joanne Jackson        MRN: [id number]  ?Date of Service: 06/08/2021 DOB: [birth year] ? ?KG:YJEH, Karie Fetch, MD  Ventura Sellers, MD    ? ?REFERRING PHYSICIAN: Ventura Sellers, MD ? ? ?DIAGNOSIS: The encounter diagnosis was Brain tumor [facility]). ? ? ?HISTORY OF PRESENT ILLNESS: Joanne Jackson is a 46 y.o. female seen at the request of Dr. Marland Kitchen ? ? ? ?PREVIOUS RADIATION THERAPY: No ? ? ?PAST MEDICAL HISTORY:  ?Past Medical History:  ?Diagnosis Date  ? Hypertension   ? Pre-diabetes   ? 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[place])   ?   ? ? ?PAST SURGICAL HISTORY: ?Past Surgical History:  ?Procedure Laterality Date  ? CESAREAN SECTION    ? DILATION AND CURETTAGE, DIAGNOSTIC / THERAPEUTIC    ? ? ? ?FAMILY HISTORY:  ?Family History  ?Problem Relation Age of Onset  ? Thyroid disease Mother   ? Heart attack Father 68  ? Hypertension Father   ? Healthy Brother   ? Healthy Daughter   ? Healthy Paternal Grandfather   ? Colon cancer Neg Hx   ? Cancer Neg Hx   ? Stroke Neg Hx   ? ? ? ?SOCIAL HISTORY:  reports that she has never smoked. She has never used smokeless tobacco. She reports that she does not currently use alcohol. She reports that she does not use drugs.  The patient is married and lives in [place].  She*** ? ? ?ALLERGIES: Patient has no known allergies. ? ? ?MEDICATIONS:  ?Current Outpatient Medications  ?Medication Sig Dispense Refill  ? butalbital-acetaminophen-caffeine (FIORICET) 50-325-40 MG tablet Take 1-2 tablets by mouth daily as needed for headache. 10 tablet 0  ? Cholecalciferol (VITAMIN D3) 50 MCG (2000 UT) TABS Take 2,000 Units by mouth daily as needed.     ? ferrous sulfate 324 (65 Fe) MG TBEC TAKE 1 TABLET BY MOUTH EVERY DAY (Patient taking differently: Take 324 mg by mouth daily.) 90 tablet 1  ? levETIRAcetam (KEPPRA) 500 MG tablet Take 1 tablet (500 mg total) by mouth 2 (two) times daily for 30 doses. 30 tablet  0  ? lisinopril (ZESTRIL) 40 MG tablet Take 1 tablet (40 mg total) by mouth daily. 90 tablet 3  ? ondansetron (ZOFRAN) 4 MG tablet Take 1 tablet (4 mg total) by mouth every 8 (eight) hours as needed for nausea or vomiting. 20 tablet 0  ? RESTASIS 0.05 % ophthalmic emulsion Place 1 drop into both eyes 2 (two) times daily.    ? ?No current facility-administered medications for this visit.  ? ? ? ?REVIEW OF SYSTEMS: On review of systems, the patient reports that ***   ? ?  ? ?PHYSICAL EXAM:  ?Wt Readings from Last 3 Encounters:  ?04/28/21 235 lb 10.8 oz (106.9 kg)  ?04/27/21 235 lb 9.6 oz (106.9 kg)  ?02/09/21 160 lb 12.8 oz (72.9 kg)  ? ?Temp Readings from Last 3 Encounters:  ?04/30/21 98.7 ?F (37.1 ?C) (Oral)  ?04/27/21 98.2 ?F (36.8 ?C) (Temporal)  ?03/02/21 98.1 ?F (36.7 ?C)  ? ?BP Readings from Last 3 Encounters:  ?04/30/21 125/82  ?04/27/21 118/80  ?03/02/21 120/84  ? ?Pulse Readings from Last 3 Encounters:  ?04/30/21 78  ?04/27/21 72  ?03/02/21 76  ? ? /10 ? ?In general this is a well appearing Asian female in no acute distress.  She's alert and oriented x4 and appropriate throughout the  examination. Cardiopulmonary assessment is negative for acute distress and she exhibits normal effort.  ? ? ? ?ECOG = *** ? ?0 - Asymptomatic (Fully active, able to carry on all predisease activities without restriction) ? ?1 - Symptomatic but completely ambulatory (Restricted in physically strenuous activity but ambulatory and able to carry out work of a light or sedentary nature. For example, light housework, office work) ? ?2 - Symptomatic, <50% in bed during the day (Ambulatory and capable of all self care but unable to carry out any work activities. Up and about more than 50% of waking hours) ? ?3 - Symptomatic, >50% in bed, but not bedbound (Capable of only limited self-care, confined to bed or chair 50% or more of waking hours) ? ?4 - Bedbound (Completely disabled. Cannot carry on any self-care. Totally confined to bed or  chair) ? ?5 - Death ? ? Oken MM, Creech RH, Tormey DC, et al. [phone]). "Toxicity and response criteria of the [facility]". [place] Oncol. 5 (6): 649-55 ? ? ? ?LABORATORY DATA:  ?Lab Results  ?Component Value Date  ? WBC 6.0 04/28/2021  ? HGB 13.2 04/28/2021  ? HCT 40.3 04/28/2021  ? MCV 86.7 04/28/2021  ? PLT 223 04/28/2021  ? ?Lab Results  ?Component Value Date  ? NA 135 04/28/2021  ? K 4.1 04/28/2021  ? CL 102 04/28/2021  ? CO2 24 04/28/2021  ? ?Lab Results  ?Component Value Date  ? ALT 20 04/28/2021  ? AST 18 04/28/2021  ? ALKPHOS 40 04/28/2021  ? BILITOT 0.9 04/28/2021  ? ?  ? ?RADIOGRAPHY: No results found. ?   ? ?IMPRESSION/PLAN: ?1. Primary malignant brain neoplasm.*** ?2. Sjogren's disease.*** ? ? ?The above documentation reflects my direct findings during this shared patient visit. Please see the separate note by Dr. Lisbeth Renshaw on this date for the remainder of the patient's plan of care. ? ? ? ?Carola Rhine, PAC ? ? ?**Disclaimer: This note was dictated with voice recognition software. Similar sounding words can inadvertently be transcribed and this note may contain transcription errors which may not have been corrected upon publication of note.** ?

## 2021-06-05 NOTE — Telephone Encounter (Signed)
Scheduled appt per 4/28 referral. Pt is aware of appt date and time. Pt is aware to arrive 15 mins prior to appt time and to bring and updated insurance card. Pt is aware of appt location.   ?

## 2021-06-05 NOTE — Telephone Encounter (Signed)
Left a voicemail on the patient's cell requesting a call back regarding the upcoming appointment with Dr. Lisbeth Renshaw on 5/9.  ? ?Mont Dutton R.T.(R)(T) ?Radiation Special Procedures Navigator  ?

## 2021-06-07 ENCOUNTER — Ambulatory Visit: Payer: 59

## 2021-06-07 ENCOUNTER — Ambulatory Visit: Payer: 59 | Admitting: Occupational Therapy

## 2021-06-08 ENCOUNTER — Ambulatory Visit: Payer: 59 | Admitting: Radiation Oncology

## 2021-06-08 ENCOUNTER — Ambulatory Visit: Payer: 59

## 2021-06-11 NOTE — Progress Notes (Signed)
?Radiation Oncology         (336) [phone] ?________________________________ ? ?Name: Joanne Jackson        MRN: [id number]  ?Date of Service: 06/13/2021 DOB: 06/28/1975 ? ?QB:HALP, Karie Fetch, MD  Ventura Sellers, MD    ? ?REFERRING PHYSICIAN: Ventura Sellers, MD ? ? ?DIAGNOSIS: The encounter diagnosis was Glioblastoma of frontal lobe ([place]). ? ? ?HISTORY OF PRESENT ILLNESS: Joanne Jackson is a 46 y.o. female seen at the request of Dr. 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 The patient presented with new onset of seizures.  Imaging of the brain in the [facility] emergency department showed no acute intracranial pathology by CT without contrast, this was on 04/28/2021, CT angio neck showed normal vasculature of the head and neck and an MRI without contrast the next day showed expansile signal abnormality in the right superior frontal gyrus.  Subsequent MRI with contrast that day showed no contrast-enhancement of the site of signal abnormality in the right frontal lobe though the concern remains high for tumor.  She  was seen at [facility], and imaging was repeated on 05/17/2021 with an MRI brain lab with contrast successful preoperative MRI localization of an ill-defined T2 flair hyperintense lesion in the right superior frontal gyrus was noted, she subsequently underwent craniotomy with tumor resection with Dr. Tommi Rumps final pathology from this procedure showed high-grade infiltrating glioma IDH mutant. Postoperative imaging on 05/19/2021 showed a small right medial cerebellar hemisphere acute infarct, postoperative changes of the right frontal lobe and marginal edema and restricted diffusion with small volume blood products within the dependent aspects of the resection cavity were noted.  Extensive soft tissue swelling and thickening involving the right frontotemporal and periorbital regions were also considered postoperative.  Given the patient's disease and desire to be treated closer to home she has  been discussed in brain oncology conference.  It was not until just recently that her pathology resulted, and she met yesterday with Dr. Mickeal Skinner.  He is recommend chemoradiation and she is seen to discuss treatment.   ? ? ? ?PREVIOUS RADIATION THERAPY: No ? ? ?PAST MEDICAL HISTORY:  ?Past Medical History:  ?Diagnosis Date  ? Glioma ([place]) 05/18/2021  ? Hypertension   ? Pre-diabetes   ? 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[place])   ?   ? ? ?PAST SURGICAL HISTORY: ?Past Surgical History:  ?Procedure Laterality Date  ? CESAREAN SECTION    ? DILATION AND CURETTAGE, DIAGNOSTIC / THERAPEUTIC    ? ? ? ?FAMILY HISTORY:  ?Family History  ?Problem Relation Age of Onset  ? Thyroid disease Mother   ? Heart attack Father 76  ? Hypertension Father   ? Healthy Brother   ? Healthy Daughter   ? Healthy Paternal Grandfather   ? Colon cancer Neg Hx   ? Cancer Neg Hx   ? Stroke Neg Hx   ? ? ? ?SOCIAL HISTORY:  reports that she has never smoked. She has never used smokeless tobacco. She reports that she does not currently use alcohol. She reports that she does not use drugs. The patient is married and lives in [place]. 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 They have a 87 year old daughter, Margreta Journey. She is currently staying with a close family friend from their church while Mrs. Sholtz is recovering. She is originally from [place] and speaks [place] as her first language.  ? ? ?  ALLERGIES: Patient has no known allergies. ? ? ?MEDICATIONS:  ?Current Outpatient Medications  ?Medication Sig Dispense Refill  ? Cholecalciferol (VITAMIN D3) 50 MCG (2000 UT) TABS Take 2,000 Units by mouth daily as needed.     ? ferrous sulfate 324 (65 Fe) MG TBEC TAKE 1 TABLET BY MOUTH EVERY DAY (Patient taking differently: Take 324 mg by mouth daily.) 90 tablet 1  ? levETIRAcetam (KEPPRA) 750 MG tablet Take 1,500 mg by mouth 2 (two) times daily.    ? lisinopril (ZESTRIL) 40 MG tablet  Take 1 tablet (40 mg total) by mouth daily. 90 tablet 3  ? ondansetron (ZOFRAN) 8 MG tablet Take 1 tablet (8 mg total) by mouth 2 (two) times daily as needed (nausea and vomiting). May take 30-60 minutes prior to Temodar administration if nausea/vomiting occurs. 30 tablet 1  ? RESTASIS 0.05 % ophthalmic emulsion Place 1 drop into both eyes 2 (two) times daily.    ? temozolomide (TEMODAR) 100 MG capsule Take 1 capsule (100 mg total) by mouth daily. May take on an empty stomach to decrease nausea & vomiting. 42 capsule 0  ? temozolomide (TEMODAR) 20 MG capsule Take 1 capsule (20 mg total) by mouth daily. May take on an empty stomach to decrease nausea & vomiting. 42 capsule 0  ? temozolomide (TEMODAR) 5 MG capsule Take 2 capsules (10 mg total) by mouth daily. May take on an empty stomach to decrease nausea & vomiting. 84 capsule 0  ? ?No current facility-administered medications for this encounter.  ? ? ? ?REVIEW OF SYSTEMS: On review of systems, the patient reports that she is doing okay. She seems to have a bit of a flat affect, and her husband has noticed this and finds that he has to remind her how to do things at times. He reports she has not had any seizures at home. She continues to have home health PT, but is curious if there is anything more specific that can be added. He reports she has had loss of strength in her left forearm and tingling and numbness at times. This has been since surgery. No other complaints are noted.  ? ?  ? ?PHYSICAL EXAM:  ?Wt Readings from Last 3 Encounters:  ?06/13/21 159 lb (72.1 kg)  ?06/12/21 157 lb 12.8 oz (71.6 kg)  ?04/28/21 235 lb 10.8 oz (106.9 kg)  ? ?Temp Readings from Last 3 Encounters:  ?06/13/21 98.5 ?F (36.9 ?C) (Oral)  ?06/12/21 98 ?F (36.7 ?C) (Temporal)  ?04/30/21 98.7 ?F (37.1 ?C) (Oral)  ? ?BP Readings from Last 3 Encounters:  ?06/13/21 108/73  ?06/12/21 104/75  ?04/30/21 125/82  ? ?Pulse Readings from Last 3 Encounters:  ?06/13/21 80  ?06/12/21 72  ?04/30/21 78   ? ?Pain Assessment ?Pain Score: 0-No pain/10 ? ?In general this is a well appearing caucasian female in no acute distress. She's alert and oriented x4 and appropriate throughout the examination. Cardiopulmonary assessment is negative for acute distress and she exhibits normal effort. She has a well healed surgical incision site at the most superior aspect of her scalp that extends toward the posterior aspect of the ears bilaterally, almost like a headband distrubution. The patient has passive motion of the left arm, but has some intact, though slower attempt at fine motor skills of the left hand. There is mild trace to 1+ dependant edema of the left hand. ? ? ? ?ECOG = 1 ? ?0 - Asymptomatic (Fully active, able to carry on all predisease activities without restriction) ? ?  1 - Symptomatic but completely ambulatory (Restricted in physically strenuous activity but ambulatory and able to carry out work of a light or sedentary nature. For example, light housework, office work) ? ?2 - Symptomatic, <50% in bed during the day (Ambulatory and capable of all self care but unable to carry out any work activities. Up and about more than 50% of waking hours) ? ?3 - Symptomatic, >50% in bed, but not bedbound (Capable of only limited self-care, confined to bed or chair 50% or more of waking hours) ? ?4 - Bedbound (Completely disabled. Cannot carry on any self-care. Totally confined to bed or chair) ? ?5 - Death ? ? Oken MM, Creech RH, Tormey DC, et al. [phone]). "Toxicity and response criteria of the [facility]". [place] Oncol. 5 (6): 649-55 ? ? ? ?LABORATORY DATA:  ?Lab Results  ?Component Value Date  ? WBC 6.0 04/28/2021  ? HGB 13.2 04/28/2021  ? HCT 40.3 04/28/2021  ? MCV 86.7 04/28/2021  ? PLT 223 04/28/2021  ? ?Lab Results  ?Component Value Date  ? NA 135 04/28/2021  ? K 4.1 04/28/2021  ? CL 102 04/28/2021  ? CO2 24 04/28/2021  ? ?Lab Results  ?Component Value Date  ? ALT 20 04/28/2021  ? AST 18  04/28/2021  ? ALKPHOS 40 04/28/2021  ? BILITOT 0.9 04/28/2021  ? ?  ? ?RADIOGRAPHY: No results found. ?   ? ?IMPRESSION/PLAN: ?1. 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 Dr. Lisbeth Renshaw discusses

## 2021-06-12 ENCOUNTER — Other Ambulatory Visit (HOSPITAL_COMMUNITY): Payer: Self-pay

## 2021-06-12 ENCOUNTER — Telehealth: Payer: Self-pay | Admitting: *Deleted

## 2021-06-12 ENCOUNTER — Telehealth: Payer: Self-pay | Admitting: Pharmacy Technician

## 2021-06-12 ENCOUNTER — Inpatient Hospital Stay (HOSPITAL_BASED_OUTPATIENT_CLINIC_OR_DEPARTMENT_OTHER): Payer: 59 | Admitting: Internal Medicine

## 2021-06-12 ENCOUNTER — Other Ambulatory Visit: Payer: Self-pay

## 2021-06-12 ENCOUNTER — Inpatient Hospital Stay: Payer: 59

## 2021-06-12 ENCOUNTER — Telehealth: Payer: Self-pay | Admitting: Pharmacist

## 2021-06-12 ENCOUNTER — Encounter: Payer: Self-pay | Admitting: Internal Medicine

## 2021-06-12 VITALS — BP 104/75 | HR 72 | Temp 98.0°F | Resp 17 | Ht 61.0 in | Wt 157.8 lb

## 2021-06-12 DIAGNOSIS — C719 Malignant neoplasm of brain, unspecified: Secondary | ICD-10-CM | POA: Diagnosis not present

## 2021-06-12 DIAGNOSIS — C711 Malignant neoplasm of frontal lobe: Secondary | ICD-10-CM | POA: Insufficient documentation

## 2021-06-12 DIAGNOSIS — Z8249 Family history of ischemic heart disease and other diseases of the circulatory system: Secondary | ICD-10-CM | POA: Insufficient documentation

## 2021-06-12 DIAGNOSIS — I10 Essential (primary) hypertension: Secondary | ICD-10-CM | POA: Insufficient documentation

## 2021-06-12 DIAGNOSIS — R569 Unspecified convulsions: Secondary | ICD-10-CM | POA: Insufficient documentation

## 2021-06-12 DIAGNOSIS — M3500 Sicca syndrome, unspecified: Secondary | ICD-10-CM | POA: Insufficient documentation

## 2021-06-12 DIAGNOSIS — Z8349 Family history of other endocrine, nutritional and metabolic diseases: Secondary | ICD-10-CM | POA: Insufficient documentation

## 2021-06-12 DIAGNOSIS — R110 Nausea: Secondary | ICD-10-CM | POA: Insufficient documentation

## 2021-06-12 DIAGNOSIS — Z79899 Other long term (current) drug therapy: Secondary | ICD-10-CM | POA: Insufficient documentation

## 2021-06-12 DIAGNOSIS — Z79630 Long term (current) use of alkylating agent: Secondary | ICD-10-CM | POA: Insufficient documentation

## 2021-06-12 DIAGNOSIS — G4700 Insomnia, unspecified: Secondary | ICD-10-CM | POA: Insufficient documentation

## 2021-06-12 DIAGNOSIS — R251 Tremor, unspecified: Secondary | ICD-10-CM | POA: Insufficient documentation

## 2021-06-12 MED ORDER — TEMOZOLOMIDE 20 MG PO CAPS
20.0000 mg | ORAL_CAPSULE | Freq: Every day | ORAL | 0 refills | Status: DC
  Filled 2021-06-12: qty 42, 42d supply, fill #0

## 2021-06-12 MED ORDER — ONDANSETRON HCL 8 MG PO TABS
8.0000 mg | ORAL_TABLET | Freq: Two times a day (BID) | ORAL | 1 refills | Status: DC | PRN
  Filled 2021-06-12: qty 30, 15d supply, fill #0

## 2021-06-12 MED ORDER — TEMOZOLOMIDE 5 MG PO CAPS
10.0000 mg | ORAL_CAPSULE | Freq: Every day | ORAL | 0 refills | Status: DC
  Filled 2021-06-12: qty 84, 42d supply, fill #0

## 2021-06-12 MED ORDER — TEMOZOLOMIDE 20 MG PO CAPS: 20.0000 mg | ORAL_CAPSULE | Freq: Every day | ORAL | 0 refills | Status: DC

## 2021-06-12 MED ORDER — TEMOZOLOMIDE 100 MG PO CAPS: 100.0000 mg | ORAL_CAPSULE | Freq: Every day | ORAL | 0 refills | Status: DC

## 2021-06-12 MED ORDER — ONDANSETRON HCL 8 MG PO TABS: 8.0000 mg | ORAL_TABLET | Freq: Two times a day (BID) | ORAL | 1 refills | Status: DC | PRN

## 2021-06-12 MED ORDER — TEMOZOLOMIDE 5 MG PO CAPS: 10.0000 mg | ORAL_CAPSULE | Freq: Every day | ORAL | 0 refills | Status: DC

## 2021-06-12 MED ORDER — TEMOZOLOMIDE 100 MG PO CAPS
100.0000 mg | ORAL_CAPSULE | Freq: Every day | ORAL | 0 refills | Status: DC
  Filled 2021-06-12: qty 45, 45d supply, fill #0

## 2021-06-12 NOTE — Progress Notes (Signed)
Has armband been applied?  Yes ? ?Does patient have an allergy to IV contrast dye?: No ?  ?Has patient ever received premedication for IV contrast dye?: n/a   ? ?Does patient take metformin?: No ? ?If patient does take metformin when was the last dose: n/a ? ?Date of lab work: 05/28/2021 ?BUN/Creat Ratio: 22  ?eGfr: 119  ? ?IV site: Right AC ? ?Has IV site been added to flowsheet?  Yes ? ? ?

## 2021-06-12 NOTE — Telephone Encounter (Addendum)
Oral Oncology Pharmacist Encounter ? 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 ? ?BMP and CBC from 05/28/21 assessed, no relevant lab abnormalities noted. Prescription dose and frequency assessed for appropriateness.  ? ?Current medication list in Epic reviewed, no relevant/significant DDIs with Temodar identified. ? ?Evaluated chart and no patient barriers to medication adherence noted.  ? ?Patient agreement for treatment documented in MD note on 06/12/21. ? ?Patient's insurance requires Temodar to be filled through [facility] - will redirect prescriptions to [facility] for dispensing.  ? ?Oral Oncology Clinic will continue to follow for insurance authorization, copayment issues, initial counseling and start date. ? ?Leron Croak, PharmD, BCPS ?Hematology/Oncology Clinical Pharmacist ?[facility] Oral Chemotherapy Navigation Clinics ?[phone] ?06/12/2021 3:15 PM ? ?

## 2021-06-12 NOTE — Telephone Encounter (Signed)
11:40 am ? ?Left a message for the patient asking for return call to answer a few questions before her appointment tomorrow.  Call back number [phone] left. ? ?Butler Vegh M. Leonie Green, BSN  ?

## 2021-06-12 NOTE — Progress Notes (Signed)
Location/Histology of Brain Tumor: Right Frontal Glioma ? ?Patient presented with symptoms of sudden onset of headache and blurry vision.  The blurry vision lasted a few minutes.  She presented to her PCP and had episode of vomiting. She noted another episode of blurry vision associated with about 1 to 2 minutes of speech difficulty.  She was evaluated at Urgent Care with CT scan. ? ? 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 2.  Status post right frontal craniotomy for resection of the patient's ill-defined anterior right frontal lobe mass. ? ? ?CT Head: Negative ? ?Past or anticipated interventions, if any, per neurosurgery:  ?Dr. Tommi Rumps ?-Right Frontal Craniotomy 05/18/2021 ? ? ?Dr. Kathyrn Sheriff 04/30/2021 ?-I will have the patient started on Keppra 500 mg p.o. twice daily ?-I have spoken to my partner, Dr. Marcello Moores who has agreed to take over this patient's care. ?-I will plan on having this patient's case discussed at tomorrow's multidisciplinary neuro-oncology conference ?-Patient is stable for discharge home from a neurosurgical standpoint.  I will arrange for her to follow-up with Dr. Marcello Moores in our clinic this coming week. ?-Patient decided to have surgery at [facility]. ? ? ?Past or anticipated interventions, if any, per medical oncology:  ?Dr. Mickeal Skinner 06/12/2021 ?-We ultimately recommended proceeding with course of intensity modulated radiation therapy and concurrent daily Temozolomide.  Radiation will be administered Mon-Fri over 6 weeks, Temodar will be dosed at '75mg'$ /m2 to be given daily over 42 days. ?- ? ? ?Dose of Decadron, if applicable: Finished while in the hospital. ? ?Recent neurologic symptoms, if any:  ?Seizures:  ?Headaches: Little bit this morning, bilateral temporal. ?Nausea: She reports some nausea.  Mostly morning, has not started any medications. ?Dizziness/ataxia: Gait steady.  Denies dizziness. ?Difficulty with hand coordination: No ?Focal numbness/weakness:  No ?Visual deficits/changes: Denies changes. ?Confusion/Memory deficits: None ?She reports lower back pain with lying on the bed.  ? ? ? ?SAFETY ISSUES: ?Prior radiation? No ?Pacemaker/ICD? No ?Possible current pregnancy? Having Cycles, Negative pregnancy test ?Is the patient on methotrexate? No ? ?Additional Complaints / other details:  ?

## 2021-06-12 NOTE — Progress Notes (Signed)
START ON PATHWAY REGIMEN - Neuro ? ? ?  One cycle, concurrent with RT: ?    Temozolomide  ? ?**Always confirm dose/schedule in your pharmacy ordering system** ? ?Patient Characteristics: ?Glioma, Glioblastoma, IDH-wildtype, Newly Diagnosed / Treatment Naive, Good Performance Status and/or Younger Patient, MGMT Promoter Unmethylated/Unknown ?Disease Classification: Glioma ?Disease Classification: Glioblastoma, IDH-wildtype ?Disease Status: Newly Diagnosed / Treatment Naive ?Performance Status: Good Performance Status and/or Younger Patient ?MGMT Promoter Methylation Status: Awaiting Test Results ?Intent of Therapy: ?Non-Curative / Palliative Intent, Discussed with Patient ?

## 2021-06-12 NOTE — Progress Notes (Signed)
? ?[facility] [address]  ?[place], [place] [address] ?[phone] ? ? ?New Patient Evaluation ? ?Date of Service: 06/12/21 ?Patient Name: Joanne Jackson ?Patient MRN: [id number] ?Patient DOB: [birth year] ?Provider: Ventura Sellers, MD ? ?Identifying Statement:  ?Joanne Jackson is a 46 y.o. female with right frontal  high grade glioma, IDH mt  who presents for initial consultation and evaluation.   ? ?Referring Provider: ?Kingsley Plan, MD ?[address] ?[place],  [place] [address] ? ?Oncologic History: ?Oncology History  ?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[place])  ?05/18/2021 Surgery  ? Right frontal craniotomy, resection with Dr. Tommi Rumps at [facility]; path demonstrates High Grade Glioma IDH-69mt. ?  ? ? ?Biomarkers: ? ?MGMT Unknown.  ?IDH 1/2 Mutated.  ?EGFR Unknown  ?TERT Unmutated  ? ?History of Present Illness: ?The patient's records from the referring physician were obtained and reviewed and the patient interviewed to confirm this HPI.  Joanne Jackson presented to medical attention in March 2023 with new onset seizures, characterized by shaking of the left hand without impaired awareness.  CNS imaging demonstrated non enhancing mass within right frontal lobe, c/w primary neoplasm.  She underwent craniotomy, resection on 05/18/21 with Dr. Tommi Rumps at [facility]; pathology was High Grade Glioma IDHmt.  Since surgery, husband has noticed more blunted (depressed?) mood, less responsive and engaging.  Otherwise she is fully independent and functional.  Referred to [facility] from [facility] for local implementation of radiation and chemotherapy. ? ?Medications: ?Current Outpatient Medications on File Prior to Visit  ?Medication Sig Dispense Refill  ? Cholecalciferol (VITAMIN D3) 50 MCG (2000 UT) TABS Take 2,000 Units by mouth daily as needed.     ? ferrous sulfate 324 (65 Fe) MG TBEC TAKE 1 TABLET BY MOUTH EVERY DAY (Patient taking differently: Take 324 mg by mouth daily.) 90 tablet 1  ?  levETIRAcetam (KEPPRA) 500 MG tablet Take 1 tablet (500 mg total) by mouth 2 (two) times daily for 30 doses. 30 tablet 0  ? lisinopril (ZESTRIL) 40 MG tablet Take 1 tablet (40 mg total) by mouth daily. 90 tablet 3  ? RESTASIS 0.05 % ophthalmic emulsion Place 1 drop into both eyes 2 (two) times daily.    ? ?No current facility-administered medications on file prior to visit.  ? ? ?Allergies: No Known Allergies ?Past Medical History:  ?Past Medical History:  ?Diagnosis Date  ? Hypertension   ? Pre-diabetes   ? 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[place])   ? ?Past Surgical History:  ?Past Surgical History:  ?Procedure Laterality Date  ? CESAREAN SECTION    ? DILATION AND CURETTAGE, DIAGNOSTIC / THERAPEUTIC    ? ?Social History:  ?Social History  ? ?Socioeconomic History  ? Marital status: Married  ?  Spouse name: Not on file  ? Number of children: 1  ? Years of education: Not on file  ? Highest education level: Not on file  ?Occupational History  ? Occupation: Accounting  ?Tobacco Use  ? Smoking status: Never  ? Smokeless tobacco: Never  ?Vaping Use  ? Vaping Use: Never used  ?Substance and Sexual Activity  ? Alcohol use: Not Currently  ? Drug use: Never  ? Sexual activity: Yes  ?  Partners: Male  ?  Birth control/protection: Other-see comments, Condom  ?  Comment: husband is infertile  ?Other Topics Concern  ? Not on file  ?Social History Narrative  ? Not on file  ? ?Social Determinants of Health  ? ?  Financial Resource Strain: Not on file  ?Food Insecurity: Not on file  ?Transportation Needs: Not on file  ?Physical Activity: Not on file  ?Stress: Not on file  ?Social Connections: Not on file  ?Intimate Partner Violence: Not on file  ? ?Family History:  ?Family History  ?Problem Relation Age of Onset  ? Thyroid disease Mother   ? Heart attack Father 75  ? Hypertension Father   ? Healthy Brother   ? Healthy Daughter   ? Healthy Paternal Grandfather   ? Colon cancer Neg Hx   ? Cancer Neg Hx   ? Stroke Neg Hx    ? ? ?Review of Systems: ?Constitutional: Doesn't report fevers, chills or abnormal weight loss ?Eyes: Doesn't report blurriness of vision ?Ears, nose, mouth, throat, and face: Doesn't report sore throat ?Respiratory: Doesn't report cough, dyspnea or wheezes ?Cardiovascular: Doesn't report palpitation, chest discomfort  ?Gastrointestinal:  Doesn't report nausea, constipation, diarrhea ?GU: Doesn't report incontinence ?Skin: Doesn't report skin rashes ?Neurological: Per HPI ?Musculoskeletal: Doesn't report joint pain ?Behavioral/Psych: Doesn't report anxiety ? ?Physical Exam: ?Vitals:  ? 06/12/21 1357  ?BP: 104/75  ?Pulse: 72  ?Resp: 17  ?Temp: 98 ?F (36.7 ?C)  ?SpO2: 99%  ? ?KPS: 90. ?General: Alert, cooperative, pleasant, in no acute distress ?Head: Normal ?EENT: No conjunctival injection or scleral icterus.  ?Lungs: Resp effort normal ?Cardiac: Regular rate ?Abdomen: Non-distended abdomen ?Skin: No rashes cyanosis or petechiae. ?Extremities: No clubbing or edema ? ?Neurologic Exam: ?Mental Status: Awake, alert, attentive to examiner. Oriented to self and environment. Language is fluent with intact comprehension. Abulic features noted. ?Cranial Nerves: Visual acuity is grossly normal. Visual fields are full. Extra-ocular movements intact. No ptosis. Face is symmetric ?Motor: Tone and bulk are normal. Power is full in both arms and legs. Reflexes are symmetric, no pathologic reflexes present.  ?Sensory: Intact to light touch ?Gait: Normal. ? ? ?Labs: ?I have reviewed the data as listed ?   ?Component Value Date/Time  ? NA 135 04/28/2021 1932  ? K 4.1 04/28/2021 1932  ? CL 102 04/28/2021 1932  ? CO2 24 04/28/2021 1932  ? GLUCOSE 135 (H) 04/28/2021 1932  ? BUN 19 04/28/2021 1932  ? CREATININE 0.86 04/28/2021 1932  ? CREATININE 0.50 11/24/2019 1449  ? CALCIUM 9.9 04/28/2021 1932  ? PROT 8.1 04/28/2021 1932  ? ALBUMIN 4.5 04/28/2021 1932  ? AST 18 04/28/2021 1932  ? ALT 20 04/28/2021 1932  ? ALKPHOS 40 04/28/2021 1932   ? BILITOT 0.9 04/28/2021 1932  ? GFRNONAA >60 04/28/2021 1932  ? GFRNONAA 119 11/24/2019 1449  ? GFRAA 138 11/24/2019 1449  ? ?Lab Results  ?Component Value Date  ? WBC 6.0 04/28/2021  ? NEUTROABS 3.3 04/28/2021  ? HGB 13.2 04/28/2021  ? HCT 40.3 04/28/2021  ? MCV 86.7 04/28/2021  ? PLT 223 04/28/2021  ? ? ?Imaging: ? ? ? ?Pathology: ?Case Report  Surgical Pathology                                Case: [id number]                                ?Authorizing Provider:  Aletta Edouard, MD Collected:           05/18/2021 1312              ?Ordering Location:  [facility]    Received:            05/18/2021 1320              ?                       Periop                                                                      ?Pathologist:           Bobby Rumpf, MD                                                        ?Intraop:               Herma Ard, MD                                                          ?Specimens:   A) - Brain, brain tissue                                                              ?             B) - Brain, brain tissue                                                               ?DIAGNOSIS    ?  ?A, B. Brain, right frontal lesion, resection: ?  ?           - High-grade infiltrating glioma, pending molecular testing. See comment. ?  ?Comment: The specimen contains a high-grade infiltrating glioma exhibiting marked nuclear pleomorphism, astrocytic nuclear morphology, and scattered mitotic activity. IDH1 R132H is not expressed by IHC, ATRX appears retained, and p53 shows expression in a large subset of neoplastic cells, suggestive of a TP53 mutation. The differential includes astrocytoma with a non-canonical IDH1 or IDH2 mutation or a high grade IDH-wildtype glioma such as glioblastoma. Representative tissue has been submitted for IDH1/2 sequencing, TERT promoter sequencing, and chromosomal microarray to help better characterize this lesion. This report will be  updated with a final integrated diagnosis upon completion of ancillary testing.  ? ? ?Assessment/Plan ?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[place]) ? ?Joanne Jackson ? ?Joanne Jackson presents with

## 2021-06-12 NOTE — Telephone Encounter (Signed)
Patient Advocate Encounter ?  ?Received notification from [facility] that prior authorization for Temozolomide is required. ?  ?PA submitted on 06/12/2021 ?Key [id number] ?Status is pending ?   ? ? ?Lady Deutscher, CPhT-Adv ?Pharmacy Patient Advocate Specialist ?[facility] Patient Advocate Team ?Direct Number: [phone]  Fax: [phone] ? ?

## 2021-06-13 ENCOUNTER — Encounter: Payer: Self-pay | Admitting: Radiation Oncology

## 2021-06-13 ENCOUNTER — Encounter: Payer: Self-pay | Admitting: Internal Medicine

## 2021-06-13 ENCOUNTER — Ambulatory Visit
Admission: RE | Admit: 2021-06-13 | Discharge: 2021-06-13 | Disposition: A | Discharge status: Home / Self Care | Payer: 59 | Source: Ambulatory Visit | Attending: Radiation Oncology | Admitting: Radiation Oncology

## 2021-06-13 ENCOUNTER — Ambulatory Visit: Payer: 59

## 2021-06-13 ENCOUNTER — Institutional Professional Consult (permissible substitution): Payer: 59 | Admitting: Radiation Oncology

## 2021-06-13 ENCOUNTER — Telehealth: Payer: Self-pay | Admitting: Internal Medicine

## 2021-06-13 ENCOUNTER — Ambulatory Visit: Payer: 59 | Admitting: Radiation Oncology

## 2021-06-13 ENCOUNTER — Other Ambulatory Visit (HOSPITAL_COMMUNITY): Payer: Self-pay

## 2021-06-13 ENCOUNTER — Other Ambulatory Visit: Payer: Self-pay | Admitting: *Deleted

## 2021-06-13 VITALS — BP 108/73 | HR 80 | Temp 98.5°F | Resp 16 | Ht 61.0 in | Wt 159.0 lb

## 2021-06-13 DIAGNOSIS — C719 Malignant neoplasm of brain, unspecified: Secondary | ICD-10-CM

## 2021-06-13 DIAGNOSIS — R110 Nausea: Secondary | ICD-10-CM | POA: Insufficient documentation

## 2021-06-13 DIAGNOSIS — C711 Malignant neoplasm of frontal lobe: Secondary | ICD-10-CM | POA: Insufficient documentation

## 2021-06-13 DIAGNOSIS — Z8349 Family history of other endocrine, nutritional and metabolic diseases: Secondary | ICD-10-CM | POA: Insufficient documentation

## 2021-06-13 DIAGNOSIS — G4700 Insomnia, unspecified: Secondary | ICD-10-CM | POA: Insufficient documentation

## 2021-06-13 DIAGNOSIS — I10 Essential (primary) hypertension: Secondary | ICD-10-CM | POA: Diagnosis not present

## 2021-06-13 DIAGNOSIS — R609 Edema, unspecified: Secondary | ICD-10-CM | POA: Diagnosis not present

## 2021-06-13 DIAGNOSIS — D496 Neoplasm of unspecified behavior of brain: Secondary | ICD-10-CM

## 2021-06-13 DIAGNOSIS — Z79630 Long term (current) use of alkylating agent: Secondary | ICD-10-CM | POA: Insufficient documentation

## 2021-06-13 DIAGNOSIS — Z79899 Other long term (current) drug therapy: Secondary | ICD-10-CM | POA: Insufficient documentation

## 2021-06-13 DIAGNOSIS — M3500 Sicca syndrome, unspecified: Secondary | ICD-10-CM | POA: Insufficient documentation

## 2021-06-13 DIAGNOSIS — R569 Unspecified convulsions: Secondary | ICD-10-CM | POA: Insufficient documentation

## 2021-06-13 DIAGNOSIS — Z8249 Family history of ischemic heart disease and other diseases of the circulatory system: Secondary | ICD-10-CM | POA: Insufficient documentation

## 2021-06-13 DIAGNOSIS — R251 Tremor, unspecified: Secondary | ICD-10-CM | POA: Insufficient documentation

## 2021-06-13 MED ORDER — SODIUM CHLORIDE 0.9% FLUSH
10.0000 mL | Freq: Once | INTRAVENOUS | Status: AC
  Administered 2021-06-13: 10 mL via INTRAVENOUS

## 2021-06-13 NOTE — Telephone Encounter (Signed)
Per 5/8 los called and left pt a message about f/u and lab appointments  called back number left if changes are needed  ?

## 2021-06-13 NOTE — Telephone Encounter (Signed)
Patient Advocate Encounter ? ?Prior Authorization for Temozolomide has been approved.   ? ?PA# [id number] ?Effective dates: 06/12/2021 through 06/13/2022 ? ?Patient must fill at [facility]. ? ? ?Lady Deutscher, CPhT-Adv ?Pharmacy Patient Advocate Specialist ?[facility] Patient Advocate Team ?Direct Number: [phone]  Fax: [phone] ? ?

## 2021-06-14 ENCOUNTER — Other Ambulatory Visit (HOSPITAL_COMMUNITY): Payer: Self-pay

## 2021-06-14 ENCOUNTER — Encounter: Payer: Self-pay | Admitting: Internal Medicine

## 2021-06-14 NOTE — Telephone Encounter (Signed)
Oral Chemotherapy Pharmacist Encounter ? 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 ? ?Counseled on administration, dosing, side effects, monitoring, drug-food interactions, safe handling, storage, and disposal. ? ?Patient will take two (2) Temodar $RemoveBef'5mg'WCgmxwbHSg$  capsules, one (1) Temodar $RemoveBef'20mg'crnxtMVrYP$  capsule, and one (1) Temodar $RemoveBef'100mg'WCEVWDTagT$  capsule (130 mg total daily dose), by mouth once daily 1 hour before radiation.  ? ?Patient will take Temodar on an empty stomach 1 hour before or 2 hours after meals.  ? ?Patient will take Temodar concurrent with radiation for 42 days straight. ? ?Temodar and radiation start date: 06/19/21 ? ?Adverse effects include but are not limited to: nausea, vomiting, anorexia, GI upset, rash, drug fever, and fatigue. Rare but serious adverse effects of pneumocystis pneumonia and secondary malignancy also discussed. PCP prophylaxis will not be initiated at this time, but may be added based on lymphocyte count in the future. ?If nausea persist despite use of zofran 8 mg tablet, 1 tablet by mouth 30-60 min prior to Temodar, patient may take at bedtime and on an empty stomach to decrease nausea and vomiting. ? ?Reviewed importance of keeping a medication schedule and plan for any missed doses. No barriers to medication adherence identified. ? ?Medication reconciliation performed and medication/allergy list updated. ? ?Insurance authorization for Temodar has been obtained. Medication has been set up for shipment to patients home through [facility]. Patient's husband knows to call [facility] 7-10 days before she is out of the first 30 days of Temodar so the pharmacy can ship the remaining 12 days of treatment to their home.  ? ?All questions answered. ? ?Patient and patient's husband voiced understanding and appreciation.  ? ?Medication education  handout placed in mail for patient. Patient and patient's husband know to call the office with questions or concerns. Oral Chemotherapy Clinic phone number provided to patient.  ? ?Leron Croak, PharmD, BCPS ?Hematology/Oncology Clinical Pharmacist ?[facility] Oral Chemotherapy Navigation Clinics ?[phone] ?06/14/2021 12:20 PM ? ?

## 2021-06-14 NOTE — Telephone Encounter (Signed)
Oral Chemotherapy Pharmacist Encounter  ? ?Attempted to reach patient to provide update and offer for initial counseling on oral medication: Temodar (temozolomide).  ? ?No answer. Left voicemail for patient to call back to discuss details of medication acquisition and initial counseling session. ? ?Medication is ready to be set [facility] per conversation with representative Gay Filler. They attempted to call patient but had to LVM.  ? ?Leron Croak, PharmD, BCPS ?Hematology/Oncology Clinical Pharmacist ?[facility] Oral Chemotherapy Navigation Clinics ?[phone] ?06/14/2021 10:52 AM ? ? ? ? ? ?

## 2021-06-15 ENCOUNTER — Other Ambulatory Visit: Payer: Self-pay | Admitting: Internal Medicine

## 2021-06-15 ENCOUNTER — Encounter: Payer: Self-pay | Admitting: Internal Medicine

## 2021-06-15 MED ORDER — OMEPRAZOLE 20 MG PO CPDR: 20.0000 mg | DELAYED_RELEASE_CAPSULE | Freq: Every day | ORAL | 2 refills | Status: DC

## 2021-06-16 ENCOUNTER — Encounter: Payer: Self-pay | Admitting: Family Medicine

## 2021-06-16 ENCOUNTER — Telehealth: Payer: Self-pay | Admitting: Family Medicine

## 2021-06-16 ENCOUNTER — Encounter: Payer: Self-pay | Admitting: Internal Medicine

## 2021-06-16 NOTE — Telephone Encounter (Signed)
Pt's spouse called to check in on message. ? ?[employer] referred to Triage Nurse ?

## 2021-06-16 NOTE — Telephone Encounter (Signed)
Image of patient message that came through [facility] after the original phone call. ? ?

## 2021-06-16 NOTE — Telephone Encounter (Signed)
Pt's spouse states pt's BP keeps staying low. He is wondering if she should stop her Lisnopril. He will be sending a copy of her latest BP readings. Please advise ?

## 2021-06-16 NOTE — Telephone Encounter (Signed)
FYI, see note below ?

## 2021-06-16 NOTE — Telephone Encounter (Signed)
Please advise 

## 2021-06-16 NOTE — Telephone Encounter (Signed)
Pt advised to "GO TO ED NOW" ? ? ? ? ? ?

## 2021-06-18 DIAGNOSIS — C711 Malignant neoplasm of frontal lobe: Secondary | ICD-10-CM | POA: Diagnosis not present

## 2021-06-19 ENCOUNTER — Ambulatory Visit
Admission: RE | Admit: 2021-06-19 | Discharge: 2021-06-19 | Disposition: A | Discharge status: Home / Self Care | Payer: 59 | Source: Ambulatory Visit | Attending: Radiation Oncology | Admitting: Radiation Oncology

## 2021-06-19 ENCOUNTER — Other Ambulatory Visit: Payer: Self-pay

## 2021-06-19 DIAGNOSIS — C711 Malignant neoplasm of frontal lobe: Secondary | ICD-10-CM | POA: Diagnosis not present

## 2021-06-19 LAB — RAD ONC ARIA SESSION SUMMARY
Course Elapsed Days: 0
Plan Fractions Treated to Date: 1
Plan Prescribed Dose Per Fraction: 2 Gy
Plan Total Fractions Prescribed: 23
Plan Total Prescribed Dose: 46 Gy
Reference Point Dosage Given to Date: 2 Gy
Reference Point Session Dosage Given: 2 Gy
Session Number: 1

## 2021-06-20 ENCOUNTER — Ambulatory Visit
Admission: RE | Admit: 2021-06-20 | Discharge: 2021-06-20 | Disposition: A | Discharge status: Home / Self Care | Payer: 59 | Source: Ambulatory Visit | Attending: Radiation Oncology | Admitting: Radiation Oncology

## 2021-06-20 ENCOUNTER — Other Ambulatory Visit: Payer: Self-pay

## 2021-06-20 DIAGNOSIS — C711 Malignant neoplasm of frontal lobe: Secondary | ICD-10-CM | POA: Diagnosis not present

## 2021-06-20 LAB — RAD ONC ARIA SESSION SUMMARY
Course Elapsed Days: 1
Plan Fractions Treated to Date: 2
Plan Prescribed Dose Per Fraction: 2 Gy
Plan Total Fractions Prescribed: 23
Plan Total Prescribed Dose: 46 Gy
Reference Point Dosage Given to Date: 4 Gy
Reference Point Session Dosage Given: 2 Gy
Session Number: 2

## 2021-06-21 ENCOUNTER — Ambulatory Visit
Admission: RE | Admit: 2021-06-21 | Discharge: 2021-06-21 | Disposition: A | Discharge status: Home / Self Care | Payer: 59 | Source: Ambulatory Visit | Attending: Radiation Oncology | Admitting: Radiation Oncology

## 2021-06-21 ENCOUNTER — Other Ambulatory Visit: Payer: Self-pay

## 2021-06-21 DIAGNOSIS — C711 Malignant neoplasm of frontal lobe: Secondary | ICD-10-CM | POA: Diagnosis not present

## 2021-06-21 LAB — RAD ONC ARIA SESSION SUMMARY
Course Elapsed Days: 2
Plan Fractions Treated to Date: 3
Plan Prescribed Dose Per Fraction: 2 Gy
Plan Total Fractions Prescribed: 23
Plan Total Prescribed Dose: 46 Gy
Reference Point Dosage Given to Date: 6 Gy
Reference Point Session Dosage Given: 2 Gy
Session Number: 3

## 2021-06-22 ENCOUNTER — Other Ambulatory Visit: Payer: Self-pay

## 2021-06-22 ENCOUNTER — Ambulatory Visit
Admission: RE | Admit: 2021-06-22 | Discharge: 2021-06-22 | Disposition: A | Discharge status: Home / Self Care | Payer: 59 | Source: Ambulatory Visit | Attending: Radiation Oncology | Admitting: Radiation Oncology

## 2021-06-22 DIAGNOSIS — C711 Malignant neoplasm of frontal lobe: Secondary | ICD-10-CM | POA: Diagnosis not present

## 2021-06-22 LAB — RAD ONC ARIA SESSION SUMMARY
Course Elapsed Days: 3
Plan Fractions Treated to Date: 4
Plan Prescribed Dose Per Fraction: 2 Gy
Plan Total Fractions Prescribed: 23
Plan Total Prescribed Dose: 46 Gy
Reference Point Dosage Given to Date: 8 Gy
Reference Point Session Dosage Given: 2 Gy
Session Number: 4

## 2021-06-22 NOTE — Progress Notes (Signed)
Pt here for patient teaching.  Pt given Radiation and You booklet, skin care instructions, and Sonafine.  Reviewed areas of pertinence such as fatigue, hair loss, nausea and vomiting, skin changes, headache, and blurry vision . Pt able to give teach back of to pat skin and use unscented/gentle soap,apply Sonafine bid and avoid applying anything to skin within 4 hours of treatment. Pt verbalizes understanding of information given and will contact nursing with any questions or concerns.    Saroya Riccobono M. Manasvi Dickard RN, BSN  

## 2021-06-23 ENCOUNTER — Other Ambulatory Visit: Payer: Self-pay

## 2021-06-23 ENCOUNTER — Ambulatory Visit
Admission: RE | Admit: 2021-06-23 | Discharge: 2021-06-23 | Disposition: A | Discharge status: Home / Self Care | Payer: 59 | Source: Ambulatory Visit | Attending: Radiation Oncology | Admitting: Radiation Oncology

## 2021-06-23 DIAGNOSIS — C711 Malignant neoplasm of frontal lobe: Secondary | ICD-10-CM | POA: Diagnosis not present

## 2021-06-23 DIAGNOSIS — C719 Malignant neoplasm of brain, unspecified: Secondary | ICD-10-CM

## 2021-06-23 LAB — RAD ONC ARIA SESSION SUMMARY
Course Elapsed Days: 4
Plan Fractions Treated to Date: 5
Plan Prescribed Dose Per Fraction: 2 Gy
Plan Total Fractions Prescribed: 23
Plan Total Prescribed Dose: 46 Gy
Reference Point Dosage Given to Date: 10 Gy
Reference Point Session Dosage Given: 2 Gy
Session Number: 5

## 2021-06-23 MED ORDER — SONAFINE EX EMUL
1.0000 "application " | Freq: Once | CUTANEOUS | Status: AC
  Administered 2021-06-23: 1 via TOPICAL

## 2021-06-26 ENCOUNTER — Ambulatory Visit
Admission: RE | Admit: 2021-06-26 | Discharge: 2021-06-26 | Disposition: A | Discharge status: Home / Self Care | Payer: 59 | Source: Ambulatory Visit | Attending: Radiation Oncology | Admitting: Radiation Oncology

## 2021-06-26 ENCOUNTER — Other Ambulatory Visit: Payer: Self-pay

## 2021-06-26 DIAGNOSIS — C711 Malignant neoplasm of frontal lobe: Secondary | ICD-10-CM | POA: Diagnosis not present

## 2021-06-26 LAB — RAD ONC ARIA SESSION SUMMARY
Course Elapsed Days: 7
Plan Fractions Treated to Date: 6
Plan Prescribed Dose Per Fraction: 2 Gy
Plan Total Fractions Prescribed: 23
Plan Total Prescribed Dose: 46 Gy
Reference Point Dosage Given to Date: 12 Gy
Reference Point Session Dosage Given: 2 Gy
Session Number: 6

## 2021-06-27 ENCOUNTER — Other Ambulatory Visit: Payer: Self-pay

## 2021-06-27 ENCOUNTER — Ambulatory Visit
Admission: RE | Admit: 2021-06-27 | Discharge: 2021-06-27 | Disposition: A | Discharge status: Home / Self Care | Payer: 59 | Source: Ambulatory Visit | Attending: Radiation Oncology | Admitting: Radiation Oncology

## 2021-06-27 DIAGNOSIS — C711 Malignant neoplasm of frontal lobe: Secondary | ICD-10-CM | POA: Diagnosis not present

## 2021-06-27 LAB — RAD ONC ARIA SESSION SUMMARY
Course Elapsed Days: 8
Plan Fractions Treated to Date: 7
Plan Prescribed Dose Per Fraction: 2 Gy
Plan Total Fractions Prescribed: 23
Plan Total Prescribed Dose: 46 Gy
Reference Point Dosage Given to Date: 14 Gy
Reference Point Session Dosage Given: 2 Gy
Session Number: 7

## 2021-06-28 ENCOUNTER — Ambulatory Visit
Admission: RE | Admit: 2021-06-28 | Discharge: 2021-06-28 | Disposition: A | Discharge status: Home / Self Care | Payer: 59 | Source: Ambulatory Visit | Attending: Radiation Oncology | Admitting: Radiation Oncology

## 2021-06-28 ENCOUNTER — Other Ambulatory Visit: Payer: Self-pay

## 2021-06-28 DIAGNOSIS — C711 Malignant neoplasm of frontal lobe: Secondary | ICD-10-CM | POA: Diagnosis not present

## 2021-06-28 LAB — RAD ONC ARIA SESSION SUMMARY
Course Elapsed Days: 9
Plan Fractions Treated to Date: 8
Plan Prescribed Dose Per Fraction: 2 Gy
Plan Total Fractions Prescribed: 23
Plan Total Prescribed Dose: 46 Gy
Reference Point Dosage Given to Date: 16 Gy
Reference Point Session Dosage Given: 2 Gy
Session Number: 8

## 2021-06-29 ENCOUNTER — Other Ambulatory Visit: Payer: Self-pay

## 2021-06-29 ENCOUNTER — Ambulatory Visit
Admission: RE | Admit: 2021-06-29 | Discharge: 2021-06-29 | Disposition: A | Discharge status: Home / Self Care | Payer: 59 | Source: Ambulatory Visit | Attending: Radiation Oncology | Admitting: Radiation Oncology

## 2021-06-29 DIAGNOSIS — C711 Malignant neoplasm of frontal lobe: Secondary | ICD-10-CM | POA: Diagnosis not present

## 2021-06-29 LAB — RAD ONC ARIA SESSION SUMMARY
Course Elapsed Days: 10
Plan Fractions Treated to Date: 9
Plan Prescribed Dose Per Fraction: 2 Gy
Plan Total Fractions Prescribed: 23
Plan Total Prescribed Dose: 46 Gy
Reference Point Dosage Given to Date: 18 Gy
Reference Point Session Dosage Given: 2 Gy
Session Number: 9

## 2021-06-30 ENCOUNTER — Other Ambulatory Visit: Payer: Self-pay

## 2021-06-30 ENCOUNTER — Ambulatory Visit
Admission: RE | Admit: 2021-06-30 | Discharge: 2021-06-30 | Disposition: A | Discharge status: Home / Self Care | Payer: 59 | Source: Ambulatory Visit | Attending: Radiation Oncology | Admitting: Radiation Oncology

## 2021-06-30 DIAGNOSIS — C711 Malignant neoplasm of frontal lobe: Secondary | ICD-10-CM | POA: Diagnosis not present

## 2021-06-30 LAB — RAD ONC ARIA SESSION SUMMARY
Course Elapsed Days: 11
Plan Fractions Treated to Date: 10
Plan Prescribed Dose Per Fraction: 2 Gy
Plan Total Fractions Prescribed: 23
Plan Total Prescribed Dose: 46 Gy
Reference Point Dosage Given to Date: 20 Gy
Reference Point Session Dosage Given: 2 Gy
Session Number: 10

## 2021-07-04 ENCOUNTER — Other Ambulatory Visit: Payer: Self-pay

## 2021-07-04 ENCOUNTER — Inpatient Hospital Stay (HOSPITAL_BASED_OUTPATIENT_CLINIC_OR_DEPARTMENT_OTHER): Payer: 59 | Admitting: Internal Medicine

## 2021-07-04 ENCOUNTER — Other Ambulatory Visit: Payer: 59

## 2021-07-04 ENCOUNTER — Inpatient Hospital Stay: Payer: 59

## 2021-07-04 ENCOUNTER — Ambulatory Visit: Payer: 59 | Admitting: Internal Medicine

## 2021-07-04 ENCOUNTER — Ambulatory Visit
Admission: RE | Admit: 2021-07-04 | Discharge: 2021-07-04 | Disposition: A | Discharge status: Home / Self Care | Payer: 59 | Source: Ambulatory Visit | Attending: Radiation Oncology | Admitting: Radiation Oncology

## 2021-07-04 VITALS — BP 108/80 | HR 74 | Temp 97.9°F | Resp 20 | Wt 159.7 lb

## 2021-07-04 DIAGNOSIS — C711 Malignant neoplasm of frontal lobe: Secondary | ICD-10-CM | POA: Diagnosis not present

## 2021-07-04 DIAGNOSIS — C719 Malignant neoplasm of brain, unspecified: Secondary | ICD-10-CM

## 2021-07-04 LAB — RAD ONC ARIA SESSION SUMMARY
Course Elapsed Days: 15
Plan Fractions Treated to Date: 11
Plan Prescribed Dose Per Fraction: 2 Gy
Plan Total Fractions Prescribed: 23
Plan Total Prescribed Dose: 46 Gy
Reference Point Dosage Given to Date: 22 Gy
Reference Point Session Dosage Given: 2 Gy
Session Number: 11

## 2021-07-04 LAB — CBC WITH DIFFERENTIAL (CANCER CENTER ONLY)
Abs Immature Granulocytes: 0.01 10*3/uL (ref 0.00–0.07)
Basophils Absolute: 0 10*3/uL (ref 0.0–0.1)
Basophils Relative: 0 %
Eosinophils Absolute: 0 10*3/uL (ref 0.0–0.5)
Eosinophils Relative: 1 %
HCT: 36.5 % (ref 36.0–46.0)
Hemoglobin: 12.8 g/dL (ref 12.0–15.0)
Immature Granulocytes: 0 %
Lymphocytes Relative: 21 %
Lymphs Abs: 1 10*3/uL (ref 0.7–4.0)
MCH: 30.3 pg (ref 26.0–34.0)
MCHC: 35.1 g/dL (ref 30.0–36.0)
MCV: 86.3 fL (ref 80.0–100.0)
Monocytes Absolute: 0.3 10*3/uL (ref 0.1–1.0)
Monocytes Relative: 7 %
Neutro Abs: 3.3 10*3/uL (ref 1.7–7.7)
Neutrophils Relative %: 71 %
Platelet Count: 210 10*3/uL (ref 150–400)
RBC: 4.23 MIL/uL (ref 3.87–5.11)
RDW: 12.8 % (ref 11.5–15.5)
WBC Count: 4.7 10*3/uL (ref 4.0–10.5)
nRBC: 0 % (ref 0.0–0.2)

## 2021-07-04 LAB — CMP (CANCER CENTER ONLY)
ALT: 16 U/L (ref 0–44)
AST: 14 U/L — ABNORMAL LOW (ref 15–41)
Albumin: 4 g/dL (ref 3.5–5.0)
Alkaline Phosphatase: 51 U/L (ref 38–126)
Anion gap: 4 — ABNORMAL LOW (ref 5–15)
BUN: 9 mg/dL (ref 6–20)
CO2: 27 mmol/L (ref 22–32)
Calcium: 9.4 mg/dL (ref 8.9–10.3)
Chloride: 105 mmol/L (ref 98–111)
Creatinine: 0.66 mg/dL (ref 0.44–1.00)
GFR, Estimated: >60 mL/min (ref >60)
Glucose, Bld: 199 mg/dL — ABNORMAL HIGH (ref 70–99)
Potassium: 3.6 mmol/L (ref 3.5–5.1)
Sodium: 136 mmol/L (ref 135–145)
Total Bilirubin: 0.6 mg/dL (ref 0.3–1.2)
Total Protein: 6.8 g/dL (ref 6.5–8.1)

## 2021-07-04 MED ORDER — TRAZODONE HCL 50 MG PO TABS: 50.0000 mg | ORAL_TABLET | Freq: Every day | ORAL | 1 refills | Status: DC

## 2021-07-04 MED ORDER — ONDANSETRON HCL 8 MG PO TABS: 8.0000 mg | ORAL_TABLET | Freq: Two times a day (BID) | ORAL | 1 refills | Status: DC | PRN

## 2021-07-04 NOTE — Progress Notes (Signed)
[facility] [place], [place] [address] [phone]   Interval Evaluation  Date of Service: 07/04/21 Patient Name: Joanne Jackson Patient MRN: [id number] Patient DOB: 11/21/1975 Provider: Ventura Sellers, MD  Identifying Statement:  Joanne Jackson is a 46 y.o. female with right frontal  high grade glioma, IDH mt    Oncologic History: Oncology History  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[place])  05/18/2021 Surgery   Right frontal craniotomy, resection with Dr. Tommi Rumps at [facility]; path demonstrates High Grade Glioma IDH-5m.   06/19/2021 -  Chemotherapy   Patient is on Treatment Plan : BRAIN GLIOBLASTOMA Radiation Therapy With Concurrent Temozolomide 75 mg/m2 Daily Followed By Sequential Maintenance Temozolomide x 6-12 cycles        Biomarkers:  MGMT Unknown.  IDH 1/2 Mutated.  EGFR Unknown  TERT Unmutated   Interval History: [name] today for follow up, now having completed 2 weeks of IMRT and Temodar.  No new or progressive complaints.  She continues to experience sleep difficulties, only sleeping 4-5 hours per night.  Nausea has been intermittent in the morning.    H+P (06/12/21) Patient presented to medical attention in March 2023 with new onset seizures, characterized by shaking of the left hand without impaired awareness.  CNS imaging demonstrated non enhancing mass within right frontal lobe, c/w primary neoplasm.  She underwent craniotomy, resection on 05/18/21 [facility] was High Grade Glioma IDHmt.  Since surgery, husband has noticed more blunted (depressed?) mood, less responsive and engaging.  Otherwise she is fully independent and functional.  [facility]  Medications: Current Outpatient Medications on File Prior to Visit  Medication Sig Dispense Refill   Cholecalciferol (VITAMIN D3) 50 MCG (2000 UT)  TABS Take 2,000 Units by mouth daily as needed.      ferrous sulfate 324 (65 Fe) MG TBEC TAKE 1 TABLET BY MOUTH EVERY DAY (Patient taking differently: Take 324 mg by mouth daily.) 90 tablet 1   levETIRAcetam (KEPPRA) 750 MG tablet Take 1,500 mg by mouth 2 (two) times daily.     lisinopril (ZESTRIL) 40 MG tablet Take 1 tablet (40 mg total) by mouth daily. 90 tablet 3   omeprazole (PRILOSEC) 20 MG capsule Take 1 capsule (20 mg total) by mouth daily. 60 capsule 2   ondansetron (ZOFRAN) 8 MG tablet Take 1 tablet (8 mg total) by mouth 2 (two) times daily as needed (nausea and vomiting). May take 30-60 minutes prior to Temodar administration if nausea/vomiting occurs. 30 tablet 1   RESTASIS 0.05 % ophthalmic emulsion Place 1 drop into both eyes 2 (two) times daily.     temozolomide (TEMODAR) 100 MG capsule Take 1 capsule (100 mg total) by mouth daily. May take on an empty stomach to decrease nausea & vomiting. 42 capsule 0   temozolomide (TEMODAR) 20 MG capsule Take 1 capsule (20 mg total) by mouth daily. May take on an empty stomach to decrease nausea & vomiting. 42 capsule 0   temozolomide (TEMODAR) 5 MG capsule Take 2 capsules (10 mg total) by mouth daily. May take on an empty stomach to decrease nausea & vomiting. 84 capsule 0   No current facility-administered medications on file prior to visit.    Allergies: No Known Allergies Past Medical History:  Past Medical History:  Diagnosis Date   [place]   Hypertension  Pre-diabetes    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HCC)    Past Surgical History:  Past Surgical History:  Procedure Laterality Date   CESAREAN SECTION     DILATION AND CURETTAGE, DIAGNOSTIC / THERAPEUTIC     Social History:  Social History   Socioeconomic History   Marital status: Married    Spouse name: Not on file   Number of children: 1   Years of education: Not on file   Highest education level: Not on file  Occupational History    Occupation: Accounting  Tobacco Use   Smoking status: Never   Smokeless tobacco: Never  Vaping Use   Vaping Use: Never used  Substance and Sexual Activity   Alcohol use: Not Currently   Drug use: Never   Sexual activity: Yes    Partners: Male    Birth control/protection: Other-see comments, Condom    Comment: husband is infertile  Other Topics Concern   Not on file  Social History Narrative   Not on file   Social Determinants of Health   Financial Resource Strain: Not on file  Food Insecurity: Not on file  Transportation Needs: Not on file  Physical Activity: Not on file  Stress: Not on file  Social Connections: Not on file  Intimate Partner Violence: Not on file   Family History:  Family History  Problem Relation Age of Onset   Thyroid disease Mother    Heart attack Father 76   Hypertension Father    Healthy Brother    Healthy Daughter    Healthy Paternal Grandfather    Colon cancer Neg Hx    Cancer Neg Hx    Stroke Neg Hx     Review of Systems: Constitutional: Doesn't report fevers, chills or abnormal weight loss Eyes: Doesn't report blurriness of vision Ears, nose, mouth, throat, and face: Doesn't report sore throat Respiratory: Doesn't report cough, dyspnea or wheezes Cardiovascular: Doesn't report palpitation, chest discomfort  Gastrointestinal:  Doesn't report nausea, constipation, diarrhea GU: Doesn't report incontinence Skin: Doesn't report skin rashes Neurological: Per HPI Musculoskeletal: Doesn't report joint pain Behavioral/Psych: Doesn't report anxiety  Physical Exam: Vitals:   07/04/21 1435  BP: 108/80  Pulse: 74  Resp: 20  Temp: 97.9 F (36.6 C)  SpO2: 99%   KPS: 90. General: Alert, cooperative, pleasant, in no acute distress Head: Normal EENT: No conjunctival injection or scleral icterus.  Lungs: Resp effort normal Cardiac: Regular rate Abdomen: Non-distended abdomen Skin: No rashes cyanosis or petechiae. Extremities: No clubbing  or edema  Neurologic Exam: Mental Status: Awake, alert, attentive to examiner. Oriented to self and environment. Language is fluent with intact comprehension. Abulic features noted. Cranial Nerves: Visual acuity is grossly normal. Visual fields are full. Extra-ocular movements intact. No ptosis. Face is symmetric Motor: Tone and bulk are normal. Power is full in both arms and legs. Reflexes are symmetric, no pathologic reflexes present.  Sensory: Intact to light touch Gait: Normal.   Labs: I have reviewed the data as listed    Component Value Date/Time   NA 136 07/04/2021 1429   K 3.6 07/04/2021 1429   CL 105 07/04/2021 1429   CO2 27 07/04/2021 1429   GLUCOSE 199 (H) 07/04/2021 1429   BUN 9 07/04/2021 1429   CREATININE 0.66 07/04/2021 1429   CREATININE 0.50 11/24/2019 1449   CALCIUM 9.4 07/04/2021 1429   PROT 6.8 07/04/2021 1429   ALBUMIN 4.0 07/04/2021 1429   AST 14 (L) 07/04/2021 1429   ALT 16 07/04/2021  1429   ALKPHOS 51 07/04/2021 1429   BILITOT 0.6 07/04/2021 1429   GFRNONAA >60 07/04/2021 1429   GFRNONAA 119 11/24/2019 1449   GFRAA 138 11/24/2019 1449   Lab Results  Component Value Date   WBC 4.7 07/04/2021   NEUTROABS 3.3 07/04/2021   HGB 12.8 07/04/2021   HCT 36.5 07/04/2021   MCV 86.3 07/04/2021   PLT 210 07/04/2021    Imaging:    Assessment/Plan 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HCC)  Amari Tengan is clinically stable today, now having completed 2 weeks of IMRT and Temodar.  We ultimately recommended proceeding with course of intensity modulated radiation therapy and concurrent daily Temozolomide.  Radiation will be administered Mon-Fri over 6 weeks, Temodar will be dosed at 40m/m2 to be given daily over 42 days.  We reviewed side effects of temodar, including fatigue, nausea/vomiting, constipation, and cytopenias.  Chemotherapy should be held for the following:  ANC less than 1,000  Platelets less than 100,000  LFT or creatinine  greater than 2x ULN  If clinical concerns/contraindications develop  Every 2 weeks during radiation, labs will be checked accompanied by a clinical evaluation in the brain tumor clinic.  Trazodone 515mHS will be prescribed for insomnia.    Keppra may decrease to 75038mID due to mood effects.  We appreciate the opportunity to participate in the care of Daryl Behring.   All questions were answered. The patient knows to call the clinic with any problems, questions or concerns. No barriers to learning were detected.  The total time spent in the encounter was 30 minutes and more than 50% was on counseling and review of test results   ZacVentura SellersD Medical Director of Neuro-Oncology [facility] [place]/30/23 2:59 PM

## 2021-07-05 ENCOUNTER — Other Ambulatory Visit: Payer: Self-pay

## 2021-07-05 ENCOUNTER — Ambulatory Visit
Admission: RE | Admit: 2021-07-05 | Discharge: 2021-07-05 | Disposition: A | Discharge status: Home / Self Care | Payer: 59 | Source: Ambulatory Visit | Attending: Radiation Oncology | Admitting: Radiation Oncology

## 2021-07-05 DIAGNOSIS — C711 Malignant neoplasm of frontal lobe: Secondary | ICD-10-CM | POA: Diagnosis not present

## 2021-07-05 LAB — RAD ONC ARIA SESSION SUMMARY
Course Elapsed Days: 16
Plan Fractions Treated to Date: 12
Plan Prescribed Dose Per Fraction: 2 Gy
Plan Total Fractions Prescribed: 23
Plan Total Prescribed Dose: 46 Gy
Reference Point Dosage Given to Date: 24 Gy
Reference Point Session Dosage Given: 2 Gy
Session Number: 12

## 2021-07-05 LAB — HM DIABETES EYE EXAM

## 2021-07-06 ENCOUNTER — Other Ambulatory Visit: Payer: Self-pay

## 2021-07-06 ENCOUNTER — Ambulatory Visit
Admission: RE | Admit: 2021-07-06 | Discharge: 2021-07-06 | Disposition: A | Discharge status: Home / Self Care | Payer: 59 | Source: Ambulatory Visit | Attending: Radiation Oncology | Admitting: Radiation Oncology

## 2021-07-06 DIAGNOSIS — R110 Nausea: Secondary | ICD-10-CM | POA: Insufficient documentation

## 2021-07-06 DIAGNOSIS — C711 Malignant neoplasm of frontal lobe: Secondary | ICD-10-CM | POA: Insufficient documentation

## 2021-07-06 DIAGNOSIS — Z79899 Other long term (current) drug therapy: Secondary | ICD-10-CM | POA: Insufficient documentation

## 2021-07-06 DIAGNOSIS — R569 Unspecified convulsions: Secondary | ICD-10-CM | POA: Insufficient documentation

## 2021-07-06 DIAGNOSIS — R251 Tremor, unspecified: Secondary | ICD-10-CM | POA: Insufficient documentation

## 2021-07-06 DIAGNOSIS — I10 Essential (primary) hypertension: Secondary | ICD-10-CM | POA: Insufficient documentation

## 2021-07-06 DIAGNOSIS — Z8249 Family history of ischemic heart disease and other diseases of the circulatory system: Secondary | ICD-10-CM | POA: Insufficient documentation

## 2021-07-06 DIAGNOSIS — Z79630 Long term (current) use of alkylating agent: Secondary | ICD-10-CM | POA: Insufficient documentation

## 2021-07-06 DIAGNOSIS — Z8349 Family history of other endocrine, nutritional and metabolic diseases: Secondary | ICD-10-CM | POA: Insufficient documentation

## 2021-07-06 DIAGNOSIS — Z510 Encounter for antineoplastic radiation therapy: Secondary | ICD-10-CM | POA: Diagnosis not present

## 2021-07-06 DIAGNOSIS — G4700 Insomnia, unspecified: Secondary | ICD-10-CM | POA: Insufficient documentation

## 2021-07-06 DIAGNOSIS — M3500 Sicca syndrome, unspecified: Secondary | ICD-10-CM | POA: Insufficient documentation

## 2021-07-06 LAB — RAD ONC ARIA SESSION SUMMARY
Course Elapsed Days: 17
Plan Fractions Treated to Date: 13
Plan Prescribed Dose Per Fraction: 2 Gy
Plan Total Fractions Prescribed: 23
Plan Total Prescribed Dose: 46 Gy
Reference Point Dosage Given to Date: 26 Gy
Reference Point Session Dosage Given: 2 Gy
Session Number: 13

## 2021-07-07 ENCOUNTER — Ambulatory Visit
Admission: RE | Admit: 2021-07-07 | Discharge: 2021-07-07 | Disposition: A | Discharge status: Home / Self Care | Payer: 59 | Source: Ambulatory Visit | Attending: Radiation Oncology | Admitting: Radiation Oncology

## 2021-07-07 ENCOUNTER — Other Ambulatory Visit: Payer: Self-pay

## 2021-07-07 DIAGNOSIS — Z510 Encounter for antineoplastic radiation therapy: Secondary | ICD-10-CM | POA: Diagnosis not present

## 2021-07-07 LAB — RAD ONC ARIA SESSION SUMMARY
Course Elapsed Days: 18
Plan Fractions Treated to Date: 14
Plan Prescribed Dose Per Fraction: 2 Gy
Plan Total Fractions Prescribed: 23
Plan Total Prescribed Dose: 46 Gy
Reference Point Dosage Given to Date: 28 Gy
Reference Point Session Dosage Given: 2 Gy
Session Number: 14

## 2021-07-10 ENCOUNTER — Other Ambulatory Visit: Payer: Self-pay

## 2021-07-10 ENCOUNTER — Ambulatory Visit
Admission: RE | Admit: 2021-07-10 | Discharge: 2021-07-10 | Disposition: A | Discharge status: Home / Self Care | Payer: 59 | Source: Ambulatory Visit | Attending: Radiation Oncology | Admitting: Radiation Oncology

## 2021-07-10 DIAGNOSIS — Z510 Encounter for antineoplastic radiation therapy: Secondary | ICD-10-CM | POA: Diagnosis not present

## 2021-07-10 LAB — RAD ONC ARIA SESSION SUMMARY
Course Elapsed Days: 21
Plan Fractions Treated to Date: 15
Plan Prescribed Dose Per Fraction: 2 Gy
Plan Total Fractions Prescribed: 23
Plan Total Prescribed Dose: 46 Gy
Reference Point Dosage Given to Date: 30 Gy
Reference Point Session Dosage Given: 2 Gy
Session Number: 15

## 2021-07-11 ENCOUNTER — Other Ambulatory Visit: Payer: Self-pay | Admitting: Internal Medicine

## 2021-07-11 ENCOUNTER — Other Ambulatory Visit: Payer: Self-pay

## 2021-07-11 ENCOUNTER — Ambulatory Visit
Admission: RE | Admit: 2021-07-11 | Discharge: 2021-07-11 | Disposition: A | Discharge status: Home / Self Care | Payer: 59 | Source: Ambulatory Visit | Attending: Radiation Oncology | Admitting: Radiation Oncology

## 2021-07-11 DIAGNOSIS — C719 Malignant neoplasm of brain, unspecified: Secondary | ICD-10-CM

## 2021-07-11 DIAGNOSIS — Z510 Encounter for antineoplastic radiation therapy: Secondary | ICD-10-CM | POA: Diagnosis not present

## 2021-07-11 LAB — RAD ONC ARIA SESSION SUMMARY
Course Elapsed Days: 22
Plan Fractions Treated to Date: 16
Plan Prescribed Dose Per Fraction: 2 Gy
Plan Total Fractions Prescribed: 23
Plan Total Prescribed Dose: 46 Gy
Reference Point Dosage Given to Date: 32 Gy
Reference Point Session Dosage Given: 2 Gy
Session Number: 16

## 2021-07-12 ENCOUNTER — Other Ambulatory Visit: Payer: Self-pay

## 2021-07-12 ENCOUNTER — Ambulatory Visit
Admission: RE | Admit: 2021-07-12 | Discharge: 2021-07-12 | Disposition: A | Discharge status: Home / Self Care | Payer: 59 | Source: Ambulatory Visit | Attending: Radiation Oncology | Admitting: Radiation Oncology

## 2021-07-12 ENCOUNTER — Encounter: Payer: Self-pay | Admitting: Internal Medicine

## 2021-07-12 DIAGNOSIS — Z510 Encounter for antineoplastic radiation therapy: Secondary | ICD-10-CM | POA: Diagnosis not present

## 2021-07-12 LAB — RAD ONC ARIA SESSION SUMMARY
Course Elapsed Days: 23
Plan Fractions Treated to Date: 17
Plan Prescribed Dose Per Fraction: 2 Gy
Plan Total Fractions Prescribed: 23
Plan Total Prescribed Dose: 46 Gy
Reference Point Dosage Given to Date: 34 Gy
Reference Point Session Dosage Given: 2 Gy
Session Number: 17

## 2021-07-13 ENCOUNTER — Ambulatory Visit
Admission: RE | Admit: 2021-07-13 | Discharge: 2021-07-13 | Disposition: A | Discharge status: Home / Self Care | Payer: 59 | Source: Ambulatory Visit | Attending: Radiation Oncology | Admitting: Radiation Oncology

## 2021-07-13 ENCOUNTER — Other Ambulatory Visit: Payer: Self-pay

## 2021-07-13 DIAGNOSIS — Z510 Encounter for antineoplastic radiation therapy: Secondary | ICD-10-CM | POA: Diagnosis not present

## 2021-07-13 LAB — RAD ONC ARIA SESSION SUMMARY
Course Elapsed Days: 24
Plan Fractions Treated to Date: 18
Plan Prescribed Dose Per Fraction: 2 Gy
Plan Total Fractions Prescribed: 23
Plan Total Prescribed Dose: 46 Gy
Reference Point Dosage Given to Date: 36 Gy
Reference Point Session Dosage Given: 2 Gy
Session Number: 18

## 2021-07-14 ENCOUNTER — Other Ambulatory Visit: Payer: Self-pay

## 2021-07-14 ENCOUNTER — Ambulatory Visit
Admission: RE | Admit: 2021-07-14 | Discharge: 2021-07-14 | Disposition: A | Discharge status: Home / Self Care | Payer: 59 | Source: Ambulatory Visit | Attending: Radiation Oncology | Admitting: Radiation Oncology

## 2021-07-14 DIAGNOSIS — G4700 Insomnia, unspecified: Secondary | ICD-10-CM | POA: Insufficient documentation

## 2021-07-14 DIAGNOSIS — Z8349 Family history of other endocrine, nutritional and metabolic diseases: Secondary | ICD-10-CM | POA: Insufficient documentation

## 2021-07-14 DIAGNOSIS — R110 Nausea: Secondary | ICD-10-CM | POA: Insufficient documentation

## 2021-07-14 DIAGNOSIS — C711 Malignant neoplasm of frontal lobe: Secondary | ICD-10-CM | POA: Insufficient documentation

## 2021-07-14 DIAGNOSIS — Z510 Encounter for antineoplastic radiation therapy: Secondary | ICD-10-CM | POA: Insufficient documentation

## 2021-07-14 DIAGNOSIS — I10 Essential (primary) hypertension: Secondary | ICD-10-CM | POA: Insufficient documentation

## 2021-07-14 DIAGNOSIS — R251 Tremor, unspecified: Secondary | ICD-10-CM | POA: Insufficient documentation

## 2021-07-14 DIAGNOSIS — Z79899 Other long term (current) drug therapy: Secondary | ICD-10-CM | POA: Insufficient documentation

## 2021-07-14 DIAGNOSIS — M3500 Sicca syndrome, unspecified: Secondary | ICD-10-CM | POA: Insufficient documentation

## 2021-07-14 DIAGNOSIS — Z8249 Family history of ischemic heart disease and other diseases of the circulatory system: Secondary | ICD-10-CM | POA: Insufficient documentation

## 2021-07-14 DIAGNOSIS — Z79630 Long term (current) use of alkylating agent: Secondary | ICD-10-CM | POA: Insufficient documentation

## 2021-07-14 DIAGNOSIS — R569 Unspecified convulsions: Secondary | ICD-10-CM | POA: Insufficient documentation

## 2021-07-14 LAB — RAD ONC ARIA SESSION SUMMARY
Course Elapsed Days: 25
Plan Fractions Treated to Date: 19
Plan Prescribed Dose Per Fraction: 2 Gy
Plan Total Fractions Prescribed: 23
Plan Total Prescribed Dose: 46 Gy
Reference Point Dosage Given to Date: 38 Gy
Reference Point Session Dosage Given: 2 Gy
Session Number: 19

## 2021-07-17 ENCOUNTER — Other Ambulatory Visit: Payer: Self-pay

## 2021-07-17 ENCOUNTER — Ambulatory Visit
Admission: RE | Admit: 2021-07-17 | Discharge: 2021-07-17 | Disposition: A | Discharge status: Home / Self Care | Payer: 59 | Source: Ambulatory Visit | Attending: Radiation Oncology | Admitting: Radiation Oncology

## 2021-07-17 DIAGNOSIS — Z510 Encounter for antineoplastic radiation therapy: Secondary | ICD-10-CM | POA: Diagnosis not present

## 2021-07-17 LAB — RAD ONC ARIA SESSION SUMMARY
Course Elapsed Days: 28
Plan Fractions Treated to Date: 20
Plan Prescribed Dose Per Fraction: 2 Gy
Plan Total Fractions Prescribed: 23
Plan Total Prescribed Dose: 46 Gy
Reference Point Dosage Given to Date: 40 Gy
Reference Point Session Dosage Given: 2 Gy
Session Number: 20

## 2021-07-18 ENCOUNTER — Other Ambulatory Visit: Payer: 59

## 2021-07-18 ENCOUNTER — Ambulatory Visit
Admission: RE | Admit: 2021-07-18 | Discharge: 2021-07-18 | Disposition: A | Discharge status: Home / Self Care | Payer: 59 | Source: Ambulatory Visit | Attending: Radiation Oncology | Admitting: Radiation Oncology

## 2021-07-18 ENCOUNTER — Inpatient Hospital Stay (HOSPITAL_BASED_OUTPATIENT_CLINIC_OR_DEPARTMENT_OTHER): Payer: 59 | Admitting: Internal Medicine

## 2021-07-18 ENCOUNTER — Other Ambulatory Visit: Payer: Self-pay

## 2021-07-18 ENCOUNTER — Ambulatory Visit: Payer: 59 | Admitting: Internal Medicine

## 2021-07-18 ENCOUNTER — Inpatient Hospital Stay: Payer: 59 | Attending: Internal Medicine

## 2021-07-18 VITALS — BP 110/78 | HR 62 | Temp 97.6°F | Resp 18 | Wt 155.9 lb

## 2021-07-18 DIAGNOSIS — Z510 Encounter for antineoplastic radiation therapy: Secondary | ICD-10-CM | POA: Insufficient documentation

## 2021-07-18 DIAGNOSIS — C719 Malignant neoplasm of brain, unspecified: Secondary | ICD-10-CM | POA: Diagnosis not present

## 2021-07-18 DIAGNOSIS — M3500 Sicca syndrome, unspecified: Secondary | ICD-10-CM | POA: Insufficient documentation

## 2021-07-18 DIAGNOSIS — G4700 Insomnia, unspecified: Secondary | ICD-10-CM | POA: Insufficient documentation

## 2021-07-18 DIAGNOSIS — R110 Nausea: Secondary | ICD-10-CM | POA: Insufficient documentation

## 2021-07-18 DIAGNOSIS — C711 Malignant neoplasm of frontal lobe: Secondary | ICD-10-CM | POA: Insufficient documentation

## 2021-07-18 DIAGNOSIS — Z8249 Family history of ischemic heart disease and other diseases of the circulatory system: Secondary | ICD-10-CM | POA: Insufficient documentation

## 2021-07-18 DIAGNOSIS — Z79630 Long term (current) use of alkylating agent: Secondary | ICD-10-CM | POA: Insufficient documentation

## 2021-07-18 DIAGNOSIS — R569 Unspecified convulsions: Secondary | ICD-10-CM | POA: Insufficient documentation

## 2021-07-18 DIAGNOSIS — Z8349 Family history of other endocrine, nutritional and metabolic diseases: Secondary | ICD-10-CM | POA: Insufficient documentation

## 2021-07-18 DIAGNOSIS — Z79899 Other long term (current) drug therapy: Secondary | ICD-10-CM | POA: Insufficient documentation

## 2021-07-18 DIAGNOSIS — I10 Essential (primary) hypertension: Secondary | ICD-10-CM | POA: Insufficient documentation

## 2021-07-18 DIAGNOSIS — R251 Tremor, unspecified: Secondary | ICD-10-CM | POA: Insufficient documentation

## 2021-07-18 LAB — RAD ONC ARIA SESSION SUMMARY
Course Elapsed Days: 29
Plan Fractions Treated to Date: 21
Plan Prescribed Dose Per Fraction: 2 Gy
Plan Total Fractions Prescribed: 23
Plan Total Prescribed Dose: 46 Gy
Reference Point Dosage Given to Date: 42 Gy
Reference Point Session Dosage Given: 2 Gy
Session Number: 21

## 2021-07-18 LAB — CBC WITH DIFFERENTIAL (CANCER CENTER ONLY)
Abs Immature Granulocytes: 0.01 10*3/uL (ref 0.00–0.07)
Basophils Absolute: 0 10*3/uL (ref 0.0–0.1)
Basophils Relative: 0 %
Eosinophils Absolute: 0.1 10*3/uL (ref 0.0–0.5)
Eosinophils Relative: 2 %
HCT: 38.4 % (ref 36.0–46.0)
Hemoglobin: 13.4 g/dL (ref 12.0–15.0)
Immature Granulocytes: 0 %
Lymphocytes Relative: 19 %
Lymphs Abs: 0.6 10*3/uL — ABNORMAL LOW (ref 0.7–4.0)
MCH: 30 pg (ref 26.0–34.0)
MCHC: 34.9 g/dL (ref 30.0–36.0)
MCV: 86.1 fL (ref 80.0–100.0)
Monocytes Absolute: 0.3 10*3/uL (ref 0.1–1.0)
Monocytes Relative: 10 %
Neutro Abs: 2.3 10*3/uL (ref 1.7–7.7)
Neutrophils Relative %: 69 %
Platelet Count: 219 10*3/uL (ref 150–400)
RBC: 4.46 MIL/uL (ref 3.87–5.11)
RDW: 12.9 % (ref 11.5–15.5)
WBC Count: 3.3 10*3/uL — ABNORMAL LOW (ref 4.0–10.5)
nRBC: 0 % (ref 0.0–0.2)

## 2021-07-18 LAB — CMP (CANCER CENTER ONLY)
ALT: 15 U/L (ref 0–44)
AST: 17 U/L (ref 15–41)
Albumin: 4.3 g/dL (ref 3.5–5.0)
Alkaline Phosphatase: 44 U/L (ref 38–126)
Anion gap: 6 (ref 5–15)
BUN: 10 mg/dL (ref 6–20)
CO2: 26 mmol/L (ref 22–32)
Calcium: 9.6 mg/dL (ref 8.9–10.3)
Chloride: 106 mmol/L (ref 98–111)
Creatinine: 0.61 mg/dL (ref 0.44–1.00)
GFR, Estimated: >60 mL/min (ref >60)
Glucose, Bld: 111 mg/dL — ABNORMAL HIGH (ref 70–99)
Potassium: 3.8 mmol/L (ref 3.5–5.1)
Sodium: 138 mmol/L (ref 135–145)
Total Bilirubin: 0.7 mg/dL (ref 0.3–1.2)
Total Protein: 7.3 g/dL (ref 6.5–8.1)

## 2021-07-18 NOTE — Progress Notes (Signed)
[facility] at [facility] [address]  [place], [place] [address] [phone]   Interval Evaluation  Date of Service: 07/18/21 Patient Name: Joanne Jackson Patient MRN: [id number] Patient DOB: 1975/10/15 Provider: Henreitta Leber, MD  Identifying Statement:  Joanne Jackson is a 46 y.o. 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 Zachery Conch at [facility]; path demonstrates High Grade Glioma IDH-55mt.   06/19/2021 -  Chemotherapy   Patient is on Treatment Plan : BRAIN GLIOBLASTOMA Radiation Therapy With Concurrent Temozolomide 75 mg/m2 Daily Followed By Sequential Maintenance Temozolomide x 6-12 cycles       Biomarkers:  MGMT Unknown.  IDH 1/2 Mutated.  EGFR Unknown  TERT Unmutated   Interval History: Joanne Jackson presents today for follow up, now having completed 4 weeks of IMRT and Temodar.  No new or progressive complaints.  She continues to experience sleep difficulties, only sleeping 4-5 hours per night.  Nausea has been intermittent in the morning.    H+P (06/12/21) Patient presented to medical attention in March 2023 with new onset seizures, characterized by shaking of the left hand without impaired awareness.  CNS imaging demonstrated non enhancing mass within right frontal lobe, c/w primary neoplasm.  She underwent craniotomy, resection on 05/18/21 with Dr. Zachery Conch at [facility]; pathology was High Grade Glioma IDHmt.  Since surgery, husband has noticed more blunted (depressed?) mood, less responsive and engaging.  Otherwise she is fully independent and functional.  Referred to [facility] from [facility] for local implementation of radiation and chemotherapy.  Medications: Current Outpatient Medications on File Prior to Visit  Medication Sig Dispense Refill   Cholecalciferol (VITAMIN D3) 50 MCG (2000 UT)  TABS Take 2,000 Units by mouth daily as needed.      ferrous sulfate 324 (65 Fe) MG TBEC TAKE 1 TABLET BY MOUTH EVERY DAY (Patient taking differently: Take 324 mg by mouth daily.) 90 tablet 1   levETIRAcetam (KEPPRA) 750 MG tablet Take 1,500 mg by mouth 2 (two) times daily.     lisinopril (ZESTRIL) 40 MG tablet Take 1 tablet (40 mg total) by mouth daily. (Patient taking differently: Take 20 mg by mouth daily.) 90 tablet 3   omeprazole (PRILOSEC) 20 MG capsule Take 1 capsule (20 mg total) by mouth daily. 60 capsule 2   ondansetron (ZOFRAN) 8 MG tablet Take 1 tablet (8 mg total) by mouth 2 (two) times daily as needed (nausea and vomiting). May take 30-60 minutes prior to Temodar administration if nausea/vomiting occurs. 30 tablet 1   RESTASIS 0.05 % ophthalmic emulsion Place 1 drop into both eyes 2 (two) times daily.     temozolomide (TEMODAR) 100 MG capsule TAKE 1 CAPSULE (100 MG TOTAL) BY MOUTH DAILY. MAY TAKE ON AN EMPTY STOMACH TO DECREASE NAUSEA & VOMITING. 12 capsule 0   temozolomide (TEMODAR) 20 MG capsule TAKE 1 CAPSULE (20 MG TOTAL) BY MOUTH DAILY. MAY TAKE ON AN EMPTY STOMACH TO DECREASE NAUSEA & VOMITING. 12 capsule 0   temozolomide (TEMODAR) 5 MG capsule TAKE 2 CAPSULES (10 MG TOTAL) BY MOUTH DAILY. MAY TAKE ON AN EMPTY STOMACH TO DECREASE NAUSEA & VOMITING. 24 capsule 0   traZODone (DESYREL) 50 MG tablet Take 1 tablet (50 mg total) by mouth at bedtime. 30 tablet 1   No current facility-administered medications on file prior to visit.  Allergies: No Known Allergies Past Medical History:  Past Medical History:  Diagnosis Date   Glioma ([place]) 05/18/2021   Hypertension    Pre-diabetes    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HCC)    Past Surgical History:  Past Surgical History:  Procedure Laterality Date   CESAREAN SECTION     DILATION AND CURETTAGE, DIAGNOSTIC / THERAPEUTIC     Social History:  Social History   Socioeconomic History   Marital status: Married     Spouse name: Not on file   Number of children: 1   Years of education: Not on file   Highest education level: Not on file  Occupational History   Occupation: Accounting  Tobacco Use   Smoking status: Never   Smokeless tobacco: Never  Vaping Use   Vaping Use: Never used  Substance and Sexual Activity   Alcohol use: Not Currently   Drug use: Never   Sexual activity: Yes    Partners: Male    Birth control/protection: Other-see comments, Condom    Comment: husband is infertile  Other Topics Concern   Not on file  Social History Narrative   Not on file   Social Determinants of Health   Financial Resource Strain: Not on file  Food Insecurity: Not on file  Transportation Needs: Not on file  Physical Activity: Not on file  Stress: Not on file  Social Connections: Not on file  Intimate Partner Violence: Not on file   Family History:  Family History  Problem Relation Age of Onset   Thyroid disease Mother    Heart attack Father 69   Hypertension Father    Healthy Brother    Healthy Daughter    Healthy Paternal Grandfather    Colon cancer Neg Hx    Cancer Neg Hx    Stroke Neg Hx     Review of Systems: Constitutional: Doesn't report fevers, chills or abnormal weight loss Eyes: Doesn't report blurriness of vision Ears, nose, mouth, throat, and face: Doesn't report sore throat Respiratory: Doesn't report cough, dyspnea or wheezes Cardiovascular: Doesn't report palpitation, chest discomfort  Gastrointestinal:  Doesn't report nausea, constipation, diarrhea GU: Doesn't report incontinence Skin: Doesn't report skin rashes Neurological: Per HPI Musculoskeletal: Doesn't report joint pain Behavioral/Psych: Doesn't report anxiety  Physical Exam: There were no vitals filed for this visit.  KPS: 90. General: Alert, cooperative, pleasant, in no acute distress Head: Normal EENT: No conjunctival injection or scleral icterus.  Lungs: Resp effort normal Cardiac: Regular  rate Abdomen: Non-distended abdomen Skin: No rashes cyanosis or petechiae. Extremities: No clubbing or edema  Neurologic Exam: Mental Status: Awake, alert, attentive to examiner. Oriented to self and environment. Language is fluent with intact comprehension. Abulic features noted. Cranial Nerves: Visual acuity is grossly normal. Visual fields are full. Extra-ocular movements intact. No ptosis. Face is symmetric Motor: Tone and bulk are normal. Power is full in both arms and legs. Reflexes are symmetric, no pathologic reflexes present.  Sensory: Intact to light touch Gait: Normal.   Labs: I have reviewed the data as listed    Component Value Date/Time   NA 136 07/04/2021 1429   K 3.6 07/04/2021 1429   CL 105 07/04/2021 1429   CO2 27 07/04/2021 1429   GLUCOSE 199 (H) 07/04/2021 1429   BUN 9 07/04/2021 1429   CREATININE 0.66 07/04/2021 1429   CREATININE 0.50 11/24/2019 1449   CALCIUM 9.4 07/04/2021 1429   PROT 6.8 07/04/2021 1429   ALBUMIN 4.0 07/04/2021 1429   AST  14 (L) 07/04/2021 1429   ALT 16 07/04/2021 1429   ALKPHOS 51 07/04/2021 1429   BILITOT 0.6 07/04/2021 1429   GFRNONAA >60 07/04/2021 1429   GFRNONAA 119 11/24/2019 1449   GFRAA 138 11/24/2019 1449   Lab Results  Component Value Date   WBC 4.7 07/04/2021   NEUTROABS 3.3 07/04/2021   HGB 12.8 07/04/2021   HCT 36.5 07/04/2021   MCV 86.3 07/04/2021   PLT 210 07/04/2021    Imaging:    Assessment/Plan 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HCC)  Joanne Jackson is clinically stable today, now having completed 4 weeks of IMRT and Temodar.  No significant changes today.  We ultimately recommended proceeding with course of intensity modulated radiation therapy and concurrent daily Temozolomide.  Radiation will be administered Mon-Fri over 6 weeks, Temodar will be dosed at $Remove'75mg'DBKCxIH$ /m2 to be given daily over 42 days.  We reviewed side effects of temodar, including fatigue, nausea/vomiting, constipation, and  cytopenias.  Chemotherapy should be held for the following:  ANC less than 1,000  Platelets less than 100,000  LFT or creatinine greater than 2x ULN  If clinical concerns/contraindications develop  Every 2 weeks during radiation, labs will be checked accompanied by a clinical evaluation in the brain tumor clinic.  May con't Trazodone $RemoveBeforeD'50mg'[id number]$  HS.  Keppra will con't Keppra $RemoveBefo'750mg'ovUTSPfkQIL$  BID.  We appreciate the opportunity to participate in the care of Lee-Anne Trostle.   All questions were answered. The patient knows to call the clinic with any problems, questions or concerns. No barriers to learning were detected.  The total time spent in the encounter was 30 minutes and more than 50% was on counseling and review of test results   Ventura Sellers, MD Medical Director of Neuro-Oncology [facility] [place] 07/18/21 2:53 PM

## 2021-07-19 ENCOUNTER — Other Ambulatory Visit: Payer: Self-pay

## 2021-07-19 ENCOUNTER — Ambulatory Visit
Admission: RE | Admit: 2021-07-19 | Discharge: 2021-07-19 | Disposition: A | Discharge status: Home / Self Care | Payer: 59 | Source: Ambulatory Visit | Attending: Radiation Oncology | Admitting: Radiation Oncology

## 2021-07-19 DIAGNOSIS — Z510 Encounter for antineoplastic radiation therapy: Secondary | ICD-10-CM | POA: Diagnosis not present

## 2021-07-19 LAB — RAD ONC ARIA SESSION SUMMARY
Course Elapsed Days: 30
Plan Fractions Treated to Date: 22
Plan Prescribed Dose Per Fraction: 2 Gy
Plan Total Fractions Prescribed: 23
Plan Total Prescribed Dose: 46 Gy
Reference Point Dosage Given to Date: 44 Gy
Reference Point Session Dosage Given: 2 Gy
Session Number: 22

## 2021-07-20 ENCOUNTER — Ambulatory Visit
Admission: RE | Admit: 2021-07-20 | Discharge: 2021-07-20 | Disposition: A | Discharge status: Home / Self Care | Payer: 59 | Source: Ambulatory Visit | Attending: Radiation Oncology | Admitting: Radiation Oncology

## 2021-07-20 ENCOUNTER — Other Ambulatory Visit: Payer: Self-pay

## 2021-07-20 DIAGNOSIS — Z510 Encounter for antineoplastic radiation therapy: Secondary | ICD-10-CM | POA: Diagnosis not present

## 2021-07-20 LAB — RAD ONC ARIA SESSION SUMMARY
Course Elapsed Days: 31
Plan Fractions Treated to Date: 23
Plan Prescribed Dose Per Fraction: 2 Gy
Plan Total Fractions Prescribed: 23
Plan Total Prescribed Dose: 46 Gy
Reference Point Dosage Given to Date: 46 Gy
Reference Point Session Dosage Given: 2 Gy
Session Number: 23

## 2021-07-21 ENCOUNTER — Ambulatory Visit
Admission: RE | Admit: 2021-07-21 | Discharge: 2021-07-21 | Disposition: A | Discharge status: Home / Self Care | Payer: 59 | Source: Ambulatory Visit | Attending: Radiation Oncology | Admitting: Radiation Oncology

## 2021-07-21 ENCOUNTER — Other Ambulatory Visit: Payer: Self-pay

## 2021-07-21 DIAGNOSIS — Z510 Encounter for antineoplastic radiation therapy: Secondary | ICD-10-CM | POA: Diagnosis not present

## 2021-07-21 LAB — RAD ONC ARIA SESSION SUMMARY
Course Elapsed Days: 32
Plan Fractions Treated to Date: 1
Plan Prescribed Dose Per Fraction: 2 Gy
Plan Total Fractions Prescribed: 7
Plan Total Prescribed Dose: 14 Gy
Reference Point Dosage Given to Date: 48 Gy
Reference Point Session Dosage Given: 2 Gy
Session Number: 24

## 2021-07-24 ENCOUNTER — Other Ambulatory Visit: Payer: Self-pay

## 2021-07-24 ENCOUNTER — Ambulatory Visit
Admission: RE | Admit: 2021-07-24 | Discharge: 2021-07-24 | Disposition: A | Discharge status: Home / Self Care | Payer: 59 | Source: Ambulatory Visit | Attending: Radiation Oncology | Admitting: Radiation Oncology

## 2021-07-24 DIAGNOSIS — Z510 Encounter for antineoplastic radiation therapy: Secondary | ICD-10-CM | POA: Diagnosis not present

## 2021-07-24 LAB — RAD ONC ARIA SESSION SUMMARY
Course Elapsed Days: 35
Plan Fractions Treated to Date: 2
Plan Prescribed Dose Per Fraction: 2 Gy
Plan Total Fractions Prescribed: 7
Plan Total Prescribed Dose: 14 Gy
Reference Point Dosage Given to Date: 50 Gy
Reference Point Session Dosage Given: 2 Gy
Session Number: 25

## 2021-07-25 ENCOUNTER — Other Ambulatory Visit: Payer: Self-pay

## 2021-07-25 ENCOUNTER — Ambulatory Visit
Admission: RE | Admit: 2021-07-25 | Discharge: 2021-07-25 | Disposition: A | Discharge status: Home / Self Care | Payer: 59 | Source: Ambulatory Visit | Attending: Radiation Oncology | Admitting: Radiation Oncology

## 2021-07-25 DIAGNOSIS — Z510 Encounter for antineoplastic radiation therapy: Secondary | ICD-10-CM | POA: Diagnosis not present

## 2021-07-25 LAB — RAD ONC ARIA SESSION SUMMARY
Course Elapsed Days: 36
Plan Fractions Treated to Date: 3
Plan Prescribed Dose Per Fraction: 2 Gy
Plan Total Fractions Prescribed: 7
Plan Total Prescribed Dose: 14 Gy
Reference Point Dosage Given to Date: 52 Gy
Reference Point Session Dosage Given: 2 Gy
Session Number: 26

## 2021-07-26 ENCOUNTER — Other Ambulatory Visit: Payer: Self-pay | Admitting: Internal Medicine

## 2021-07-26 ENCOUNTER — Ambulatory Visit
Admission: RE | Admit: 2021-07-26 | Discharge: 2021-07-26 | Disposition: A | Discharge status: Home / Self Care | Payer: 59 | Source: Ambulatory Visit | Attending: Radiation Oncology | Admitting: Radiation Oncology

## 2021-07-26 ENCOUNTER — Other Ambulatory Visit: Payer: Self-pay

## 2021-07-26 DIAGNOSIS — Z510 Encounter for antineoplastic radiation therapy: Secondary | ICD-10-CM | POA: Diagnosis not present

## 2021-07-26 LAB — RAD ONC ARIA SESSION SUMMARY
Course Elapsed Days: 37
Plan Fractions Treated to Date: 4
Plan Prescribed Dose Per Fraction: 2 Gy
Plan Total Fractions Prescribed: 7
Plan Total Prescribed Dose: 14 Gy
Reference Point Dosage Given to Date: 54 Gy
Reference Point Session Dosage Given: 2 Gy
Session Number: 27

## 2021-07-27 ENCOUNTER — Other Ambulatory Visit: Payer: Self-pay

## 2021-07-27 ENCOUNTER — Ambulatory Visit
Admission: RE | Admit: 2021-07-27 | Discharge: 2021-07-27 | Disposition: A | Discharge status: Home / Self Care | Payer: 59 | Source: Ambulatory Visit | Attending: Radiation Oncology | Admitting: Radiation Oncology

## 2021-07-27 DIAGNOSIS — Z510 Encounter for antineoplastic radiation therapy: Secondary | ICD-10-CM | POA: Diagnosis not present

## 2021-07-27 LAB — RAD ONC ARIA SESSION SUMMARY
Course Elapsed Days: 38
Plan Fractions Treated to Date: 5
Plan Prescribed Dose Per Fraction: 2 Gy
Plan Total Fractions Prescribed: 7
Plan Total Prescribed Dose: 14 Gy
Reference Point Dosage Given to Date: 56 Gy
Reference Point Session Dosage Given: 2 Gy
Session Number: 28

## 2021-07-28 ENCOUNTER — Other Ambulatory Visit: Payer: Self-pay

## 2021-07-28 ENCOUNTER — Ambulatory Visit
Admission: RE | Admit: 2021-07-28 | Discharge: 2021-07-28 | Disposition: A | Discharge status: Home / Self Care | Payer: 59 | Source: Ambulatory Visit | Attending: Radiation Oncology | Admitting: Radiation Oncology

## 2021-07-28 DIAGNOSIS — Z510 Encounter for antineoplastic radiation therapy: Secondary | ICD-10-CM | POA: Diagnosis not present

## 2021-07-28 LAB — RAD ONC ARIA SESSION SUMMARY
Course Elapsed Days: 39
Plan Fractions Treated to Date: 6
Plan Prescribed Dose Per Fraction: 2 Gy
Plan Total Fractions Prescribed: 7
Plan Total Prescribed Dose: 14 Gy
Reference Point Dosage Given to Date: 58 Gy
Reference Point Session Dosage Given: 2 Gy
Session Number: 29

## 2021-07-31 ENCOUNTER — Inpatient Hospital Stay (HOSPITAL_BASED_OUTPATIENT_CLINIC_OR_DEPARTMENT_OTHER): Payer: 59 | Admitting: Internal Medicine

## 2021-07-31 ENCOUNTER — Ambulatory Visit: Payer: 59 | Admitting: Internal Medicine

## 2021-07-31 ENCOUNTER — Ambulatory Visit
Admission: RE | Admit: 2021-07-31 | Discharge: 2021-07-31 | Disposition: A | Discharge status: Home / Self Care | Payer: 59 | Source: Ambulatory Visit | Attending: Radiation Oncology | Admitting: Radiation Oncology

## 2021-07-31 ENCOUNTER — Inpatient Hospital Stay: Payer: 59

## 2021-07-31 ENCOUNTER — Other Ambulatory Visit: Payer: Self-pay

## 2021-07-31 ENCOUNTER — Other Ambulatory Visit: Payer: 59

## 2021-07-31 ENCOUNTER — Encounter: Payer: Self-pay | Admitting: Radiation Oncology

## 2021-07-31 VITALS — BP 110/73 | HR 58 | Temp 98.2°F | Resp 20 | Wt 154.4 lb

## 2021-07-31 DIAGNOSIS — Z510 Encounter for antineoplastic radiation therapy: Secondary | ICD-10-CM | POA: Diagnosis not present

## 2021-07-31 DIAGNOSIS — C719 Malignant neoplasm of brain, unspecified: Secondary | ICD-10-CM | POA: Diagnosis not present

## 2021-07-31 LAB — RAD ONC ARIA SESSION SUMMARY
Course Elapsed Days: 42
Plan Fractions Treated to Date: 7
Plan Prescribed Dose Per Fraction: 2 Gy
Plan Total Fractions Prescribed: 7
Plan Total Prescribed Dose: 14 Gy
Reference Point Dosage Given to Date: 60 Gy
Reference Point Session Dosage Given: 2 Gy
Session Number: 30

## 2021-07-31 LAB — CBC WITH DIFFERENTIAL (CANCER CENTER ONLY)
Abs Immature Granulocytes: 0.01 10*3/uL (ref 0.00–0.07)
Basophils Absolute: 0 10*3/uL (ref 0.0–0.1)
Basophils Relative: 0 %
Eosinophils Absolute: 0 10*3/uL (ref 0.0–0.5)
Eosinophils Relative: 1 %
HCT: 38.2 % (ref 36.0–46.0)
Hemoglobin: 13.3 g/dL (ref 12.0–15.0)
Immature Granulocytes: 0 %
Lymphocytes Relative: 15 %
Lymphs Abs: 0.5 10*3/uL — ABNORMAL LOW (ref 0.7–4.0)
MCH: 29.6 pg (ref 26.0–34.0)
MCHC: 34.8 g/dL (ref 30.0–36.0)
MCV: 84.9 fL (ref 80.0–100.0)
Monocytes Absolute: 0.3 10*3/uL (ref 0.1–1.0)
Monocytes Relative: 8 %
Neutro Abs: 2.4 10*3/uL (ref 1.7–7.7)
Neutrophils Relative %: 76 %
Platelet Count: 183 10*3/uL (ref 150–400)
RBC: 4.5 MIL/uL (ref 3.87–5.11)
RDW: 12.7 % (ref 11.5–15.5)
WBC Count: 3.2 10*3/uL — ABNORMAL LOW (ref 4.0–10.5)
nRBC: 0 % (ref 0.0–0.2)

## 2021-07-31 LAB — CMP (CANCER CENTER ONLY)
ALT: 16 U/L (ref 0–44)
AST: 16 U/L (ref 15–41)
Albumin: 4.1 g/dL (ref 3.5–5.0)
Alkaline Phosphatase: 45 U/L (ref 38–126)
Anion gap: 8 (ref 5–15)
BUN: 7 mg/dL (ref 6–20)
CO2: 23 mmol/L (ref 22–32)
Calcium: 9.3 mg/dL (ref 8.9–10.3)
Chloride: 106 mmol/L (ref 98–111)
Creatinine: 0.69 mg/dL (ref 0.44–1.00)
GFR, Estimated: >60 mL/min (ref >60)
Glucose, Bld: 133 mg/dL — ABNORMAL HIGH (ref 70–99)
Potassium: 3.7 mmol/L (ref 3.5–5.1)
Sodium: 137 mmol/L (ref 135–145)
Total Bilirubin: 0.6 mg/dL (ref 0.3–1.2)
Total Protein: 6.8 g/dL (ref 6.5–8.1)

## 2021-07-31 MED ORDER — TEMAZEPAM 15 MG PO CAPS: 15.0000 mg | ORAL_CAPSULE | Freq: Every evening | ORAL | 0 refills | Status: DC | PRN

## 2021-07-31 NOTE — Therapy (Signed)
OUTPATIENT PHYSICAL THERAPY NEURO EVALUATION   Patient Name: Joanne Jackson MRN: [id number] DOB:[birth year], 46 y.o., female Today's Date: 08/02/2021   PCP: Leamon Arnt, MD REFERRING PROVIDER: Ventura Sellers, MD     PT End of Session - 08/02/21 1609     Visit Number 1    Number of Visits 5    Date for PT Re-Evaluation 09/27/21    Authorization Type U[employer]    Equipment Utilized During Treatment Gait belt    Activity Tolerance Patient tolerated treatment well    Behavior During Therapy Flat affect;WFL for tasks assessed/performed             Past Medical History:  Diagnosis Date   Glioma ([place]) 05/18/2021   Hypertension    Pre-diabetes    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[place])    Past Surgical History:  Procedure Laterality Date   CESAREAN SECTION     DILATION AND CURETTAGE, DIAGNOSTIC / THERAPEUTIC     Patient Active Problem List   Diagnosis Date Noted   Dyslipidemia 04/30/2021   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[place]) 04/30/2021   Episode of transient neurologic symptoms 04/28/2021   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[place]) 10/25/2020   History of gestational diabetes 10/25/2020   Iron deficiency anemia 12/03/2019   Tubular adenoma of colon 10/24/2018   Essential hypertension 12/26/2017   Prediabetes 12/26/2017   Intramural leiomyoma of uterus 07/29/2015    ONSET DATE: 04/28/21  REFERRING DIAG: C71.9 (ICD-10-CM) - 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 Reports feeling tired and weak and having  trouble sleeping. Denies weakness, N/T, balance, dizziness. 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  Pt accompanied by: self  PERTINENT HISTORY: R frontal glioma, HTN, sjogren's  PAIN:  Are you having pain? No  PRECAUTIONS: None  WEIGHT BEARING RESTRICTIONS No  FALLS: Has patient fallen in last 6 months? No  LIVING ENVIRONMENT: Lives with: lives with their family; 30 y/o daughter Lives in: House/apartment Stairs: Yes: Internal: 15 steps; on right going up and on left going up Has following equipment at home: None  PLOF: Independent  PATIENT  GOALS return to work  OBJECTIVE:   DIAGNOSTIC FINDINGS: 04/29/21 brain MRI: No contrast enhancement at the site of signal abnormality in the right frontal lobe. This remains highly concerning for tumor, most likely a low-grade glioma  COGNITION: Overall cognitive status: No family/caregiver present to determine baseline cognitive functioning   SENSATION: WFL  COORDINATION: Finger to nose intact B, sightly slower on L UE with alt pronation/supination, heel to shin intact B   MUSCLE TONE: LLE: normal B   POSTURE: No Significant postural limitations  LOWER EXTREMITY ROM:     Active  Right Eval Left Eval  Hip flexion    Hip extension    Hip abduction    Hip adduction    Hip internal rotation    Hip external rotation    Knee flexion    Knee extension    Ankle dorsiflexion 20 7  Ankle plantarflexion    Ankle inversion    Ankle eversion     (Blank rows = not tested)  LOWER EXTREMITY MMT:    MMT (in sitting) Right Eval Left Eval  Hip flexion 4+ 3+  Hip extension    Hip abduction 4- 3+  Hip adduction 4- 3+  Hip internal rotation    Hip external rotation    Knee flexion 4 4-  Knee extension 4 4-  Ankle dorsiflexion 4 4-  Ankle plantarflexion 4 4-  Ankle inversion    Ankle eversion    (Blank rows = not tested)   GAIT: Gait  pattern:  slightly increased L toe out d/t more limited foot clearance Assistive device utilized: None Level of assistance: Complete Independence   FUNCTIONAL TESTs:   [facility] PT Assessment - 08/02/21 0001       Standardized Balance Assessment   Standardized Balance Assessment 10 meter walk test;Five Times Sit to Stand    Five times sit to stand comments  13.6   limited eccentric control and not fully upright upon standing ; without UEs   10 Meter Walk 9.59 sec, 3.42 ft/sec      Functional Gait  Assessment   Gait assessed  Yes    Gait Level Surface Walks 20 ft in less than 5.5 sec, no assistive devices, good speed, no evidence for imbalance, normal gait pattern, deviates no more than 6 in outside of the 12 in walkway width.    Change in Gait Speed Able to change speed, demonstrates mild gait deviations, deviates 6-10 in outside of the 12 in walkway width, or no gait deviations, unable to achieve a major change in velocity, or uses a change in velocity, or uses an assistive device.   limited by attention   Gait with Horizontal Head Turns Performs head turns smoothly with no change in gait. Deviates no more than 6 in outside 12 in walkway width    Gait with Vertical Head Turns Performs head turns with no change in gait. Deviates no more than 6 in outside 12 in walkway width.    Gait and Pivot Turn Pivot turns safely within 3 sec and stops quickly with no loss of balance.    Step Over Obstacle Is able to step over 2 stacked shoe boxes taped together (9 in total height) without changing gait speed. No evidence of imbalance.    Gait with Narrow Base of Support Is able to ambulate for 10 steps heel to toe with no staggering.    Gait with Eyes Closed Walks 20 ft, no assistive devices, good speed, no evidence of imbalance,  normal gait pattern, deviates no more than 6 in outside 12 in walkway width. Ambulates 20 ft in less than 7 sec.    Ambulating Backwards Walks 20 ft, no assistive devices, good speed,  no evidence for imbalance, normal gait    Steps Alternating feet, no rail.    Total Score 29              PATIENT EDUCATION: Education details: prognosis, POC, HEP Person educated: Patient Education method: Explanation, Demonstration, Tactile cues, Verbal cues, and Handouts Education comprehension: verbalized understanding   HOME EXERCISE PROGRAM: Access Code: [email or web address] URL: [email or web address] Date: 08/02/2021 Prepared by: [facility]  Exercises - Gastroc Stretch on Wall  - 1 x daily - 5 x weekly - 2 sets - 30 sec hold - Forward Step Up with Counter Support  - 1 x daily - 5 x weekly - 2 sets - 10 reps - Squat with Chair Touch  - 1 x daily - 5 x weekly - 2 sets - 10 reps - Seated Hamstring Curl with Anchored Resistance  - 1 x daily - 5 x weekly - 2 sets - 10 reps - Marching with Resistance  - 1 x daily - 5 x weekly - 2 sets - 10 reps   GOALS: Goals reviewed with patient? Yes  SHORT TERM GOALS: Target date: 08/30/2021  Patient to be independent with initial HEP. Baseline: HEP initiated Goal status: INITIAL    LONG TERM GOALS: Target date: 09/27/2021  Patient to be independent with advanced HEP. Baseline: Not yet initiated  Goal status: INITIAL  Patient to demonstrate L LE strength >/=4/5.  Baseline: See above Goal status: INITIAL  Patient to demonstrate L ankle dorsiflexion AROM to 13 degrees. Baseline: 7 deg Goal status: INITIAL   ASSESSMENT:  CLINICAL IMPRESSION:  Patient is a 46 y/o F presenting to OPPT with c/o increased fatigue s/p R frontal glioma treated with R frontal craniotomy 05/17/21, chemo and radiation completed 07/31/21. Patient today presenting with slightly decreased coordination on L UE, decreased L ankle dorsiflexion AROM, and marked L LE weakness. Balance testing appeared normal. 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 Patient was educated on gentle  stretching and strengthening HEP and reported understanding. Prior to current episode, patient was independent. Would benefit from skilled PT services once every 2 weeks for 8 weeks to address aforementioned impairments in order to optimize level of function.     OBJECTIVE IMPAIRMENTS decreased activity tolerance, decreased endurance, decreased ROM, decreased strength, and impaired flexibility.   ACTIVITY LIMITATIONS lifting, bending, squatting, stairs, and transfers  PARTICIPATION LIMITATIONS: meal prep, cleaning, laundry, shopping, community activity, occupation, yard work, and church  PERSONAL FACTORS Past/current experiences, Time since onset of injury/illness/exacerbation, Transportation, and 3+ comorbidities: R frontal glioma, HTN, sjogren's  are also affecting patient's functional outcome.   REHAB POTENTIAL: Good  CLINICAL DECISION MAKING: Evolving/moderate complexity  EVALUATION COMPLEXITY: Moderate  PLAN: PT FREQUENCY:  once every 2 weeks  PT DURATION: 8 weeks  PLANNED INTERVENTIONS: Therapeutic exercises, Therapeutic activity, Neuromuscular re-education, Balance training, Gait training, Patient/Family education, Joint mobilization, Stair training, Vestibular training, Canalith repositioning, Aquatic Therapy, Dry Needling, Electrical stimulation, Cryotherapy, Moist heat, Taping, Manual therapy, and Re-evaluation  PLAN FOR NEXT SESSION: reassess HEP and progress L LE strengthening activities    Janene Harvey, [place], DPT 08/02/21 4:21 PM  [facility] at [facility] [address], [place], [place] [address] Phone # [phone] Fax # [phone]

## 2021-07-31 NOTE — Progress Notes (Signed)
[facility] [address]  [place], [place] [address] [phone]   Interval Evaluation  Date of Service: 07/31/21 Patient Name: Joanne Jackson Patient MRN: [id number] Patient DOB: 03-23-1975 Provider: Henreitta Leber, MD  Identifying Statement:  Joanne Jackson is a 46 y.o. 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 Zachery Conch at [facility]; path demonstrates High Grade Glioma IDH-9mt.   06/19/2021 -  Chemotherapy   Patient is on Treatment Plan : BRAIN GLIOBLASTOMA Radiation Therapy With Concurrent Temozolomide 75 mg/m2 Daily Followed By Sequential Maintenance Temozolomide x 6-12 cycles       Biomarkers:  MGMT Unknown.  IDH 1/2 Mutated.  EGFR Unknown  TERT Unmutated   Interval History: Joanne Jackson, now in final week of IMRT and Temodar.  No new or progressive complaints.  Sleep issues persist despite dosing trazodone, only sleeping 4-5 hours per night.  No seizures or headaches.    H+P (06/12/21) Patient presented to medical attention in March 2023 with new onset seizures, characterized by shaking of the left hand without impaired awareness.  CNS imaging demonstrated non enhancing mass within right Jackson lobe, c/w primary neoplasm.  She underwent craniotomy, resection on 05/18/21 with Dr. Zachery Conch at [facility]; pathology was High Grade Glioma IDHmt.  Since surgery, husband has noticed more blunted (depressed?) mood, less responsive and engaging.  Otherwise she is fully independent and functional.  Referred to [facility] from [facility] for local implementation of radiation and chemotherapy.  Medications: Current Outpatient Medications on File Prior to Visit  Medication Sig Dispense Refill   Cholecalciferol (VITAMIN D3) 50 MCG (2000 UT) TABS Take 2,000 Units by mouth  daily as needed.      ferrous sulfate 324 (65 Fe) MG TBEC TAKE 1 TABLET BY MOUTH EVERY DAY (Patient taking differently: Take 324 mg by mouth daily.) 90 tablet 1   levETIRAcetam (KEPPRA) 750 MG tablet Take 1,500 mg by mouth 2 (two) times daily.     lisinopril (ZESTRIL) 40 MG tablet Take 1 tablet (40 mg total) by mouth daily. (Patient taking differently: Take 20 mg by mouth daily.) 90 tablet 3   omeprazole (PRILOSEC) 20 MG capsule Take 1 capsule (20 mg total) by mouth daily. 60 capsule 2   ondansetron (ZOFRAN) 8 MG tablet Take 1 tablet (8 mg total) by mouth 2 (two) times daily as needed (nausea and vomiting). May take 30-60 minutes prior to Temodar administration if nausea/vomiting occurs. 30 tablet 1   RESTASIS 0.05 % ophthalmic emulsion Place 1 drop into both eyes 2 (two) times daily.     temozolomide (TEMODAR) 100 MG capsule TAKE 1 CAPSULE (100 MG TOTAL) BY MOUTH DAILY. MAY TAKE ON AN EMPTY STOMACH TO DECREASE NAUSEA & VOMITING. 12 capsule 0   temozolomide (TEMODAR) 20 MG capsule TAKE 1 CAPSULE (20 MG TOTAL) BY MOUTH DAILY. MAY TAKE ON AN EMPTY STOMACH TO DECREASE NAUSEA & VOMITING. 12 capsule 0   temozolomide (TEMODAR) 5 MG capsule TAKE 2 CAPSULES (10 MG TOTAL) BY MOUTH DAILY. MAY TAKE ON AN EMPTY STOMACH TO DECREASE NAUSEA & VOMITING. 24 capsule 0   traZODone (DESYREL) 50 MG tablet TAKE 1 TABLET BY MOUTH EVERYDAY AT BEDTIME 90 tablet 1   No current facility-administered medications on file prior to visit.    Allergies: No Known  Allergies Past Medical History:  Past Medical History:  Diagnosis Date   Glioma (HCC) 05/18/2021   Hypertension    Pre-diabetes    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HCC)    Past Surgical History:  Past Surgical History:  Procedure Laterality Date   CESAREAN SECTION     DILATION AND CURETTAGE, DIAGNOSTIC / THERAPEUTIC     Social History:  Social History   Socioeconomic History   Marital status: Married    Spouse name: Not on file    Number of children: 1   Years of education: Not on file   Highest education level: Not on file  Occupational History   Occupation: Accounting  Tobacco Use   Smoking status: Never   Smokeless tobacco: Never  Vaping Use   Vaping Use: Never used  Substance and Sexual Activity   Alcohol use: Not Currently   Drug use: Never   Sexual activity: Yes    Partners: Male    Birth control/protection: Other-see comments, Condom    Comment: husband is infertile  Other Topics Concern   Not on file  Social History Narrative   Not on file   Social Determinants of Health   Financial Resource Strain: Not on file  Food Insecurity: Not on file  Transportation Needs: Not on file  Physical Activity: Not on file  Stress: Not on file  Social Connections: Not on file  Intimate Partner Violence: Not on file   Family History:  Family History  Problem Relation Age of Onset   Thyroid disease Mother    Heart attack Father 70   Hypertension Father    Healthy Brother    Healthy Daughter    Healthy Paternal Grandfather    Colon cancer Neg Hx    Cancer Neg Hx    Stroke Neg Hx     Review of Systems: Constitutional: Doesn't report fevers, chills or abnormal weight loss Eyes: Doesn't report blurriness of vision Ears, nose, mouth, throat, and face: Doesn't report sore throat Respiratory: Doesn't report cough, dyspnea or wheezes Cardiovascular: Doesn't report palpitation, chest discomfort  Gastrointestinal:  Doesn't report nausea, constipation, diarrhea GU: Doesn't report incontinence Skin: Doesn't report skin rashes Neurological: Per HPI Musculoskeletal: Doesn't report joint pain Behavioral/Psych: Doesn't report anxiety  Physical Exam: Vitals:   07/31/21 1452  BP: 110/73  Pulse: (!) 58  Resp: 20  Temp: 98.2 F (36.8 C)  SpO2: 99%    KPS: 90. General: Alert, cooperative, pleasant, in no acute distress Head: Normal EENT: No conjunctival injection or scleral icterus.  Lungs: Resp  effort normal Cardiac: Regular rate Abdomen: Non-distended abdomen Skin: No rashes cyanosis or petechiae. Extremities: No clubbing or edema  Neurologic Exam: Mental Status: Awake, alert, attentive to examiner. Oriented to self and environment. Language is fluent with intact comprehension. Abulic features noted. Cranial Nerves: Visual acuity is grossly normal. Visual fields are full. Extra-ocular movements intact. No ptosis. Face is symmetric Motor: Tone and bulk are normal. Power is full in both arms and legs. Reflexes are symmetric, no pathologic reflexes present.  Sensory: Intact to light touch Gait: Normal.   Labs: I have reviewed the data as listed    Component Value Date/Time   NA 137 07/31/2021 1336   K 3.7 07/31/2021 1336   CL 106 07/31/2021 1336   CO2 23 07/31/2021 1336   GLUCOSE 133 (H) 07/31/2021 1336   BUN 7 07/31/2021 1336   CREATININE 0.69 07/31/2021 1336   CREATININE 0.50 11/24/2019 1449   CALCIUM 9.3 07/31/2021 1336  PROT 6.8 07/31/2021 1336   ALBUMIN 4.1 07/31/2021 1336   AST 16 07/31/2021 1336   ALT 16 07/31/2021 1336   ALKPHOS 45 07/31/2021 1336   BILITOT 0.6 07/31/2021 1336   GFRNONAA >60 07/31/2021 1336   GFRNONAA 119 11/24/2019 1449   GFRAA 138 11/24/2019 1449   Lab Results  Component Value Date   WBC 3.2 (L) 07/31/2021   NEUTROABS 2.4 07/31/2021   HGB 13.3 07/31/2021   HCT 38.2 07/31/2021   MCV 84.9 07/31/2021   PLT 183 07/31/2021    Imaging:    Assessment/Plan 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    We ultimately recommended completing course of intensity modulated radiation therapy and concurrent daily Temozolomide.  Radiation will be administered Mon-Fri over 6 weeks, Temodar will be dosed at 75mg /m2 to be given daily over 42 days.  We reviewed side effects of temodar, including fatigue, nausea/vomiting, constipation, and  cytopenias.  Chemotherapy should be held for the following:  ANC less than 1,000  Platelets less than 100,000  LFT or creatinine greater than 2x ULN  If clinical concerns/contraindications develop  Every 2 weeks during radiation, labs will be checked accompanied by a clinical evaluation in the brain tumor clinic.  May con't Trazodone 50mg  HS.  We will add on restoril 15mg  HS given refractory insomnia.  Keppra will con't Keppra 750mg  BID.  We appreciate the opportunity to participate in the care of Meredyth Bechtold.  She will follow Jackson with [place] after [facility] visit 08/16/21.  All questions were answered. The patient knows to call the clinic with any problems, questions or concerns. No barriers to learning were detected.  The total time spent in the encounter was 30 minutes and more than 50% was on counseling and review of test results   Henreitta Leber, MD Medical Director of Neuro-Oncology [facility] [place] 07/31/21 3:58 PM

## 2021-08-01 ENCOUNTER — Encounter: Payer: Self-pay | Admitting: Internal Medicine

## 2021-08-01 ENCOUNTER — Ambulatory Visit: Payer: 59 | Attending: Neurological Surgery

## 2021-08-01 ENCOUNTER — Encounter: Payer: Self-pay | Admitting: Radiation Oncology

## 2021-08-01 ENCOUNTER — Telehealth: Payer: Self-pay | Admitting: Internal Medicine

## 2021-08-01 ENCOUNTER — Other Ambulatory Visit: Payer: Self-pay

## 2021-08-01 DIAGNOSIS — I69354 Hemiplegia and hemiparesis following cerebral infarction affecting left non-dominant side: Secondary | ICD-10-CM | POA: Insufficient documentation

## 2021-08-01 DIAGNOSIS — R41841 Cognitive communication deficit: Secondary | ICD-10-CM | POA: Diagnosis present

## 2021-08-01 DIAGNOSIS — R278 Other lack of coordination: Secondary | ICD-10-CM | POA: Insufficient documentation

## 2021-08-01 DIAGNOSIS — M6281 Muscle weakness (generalized): Secondary | ICD-10-CM | POA: Insufficient documentation

## 2021-08-01 NOTE — Progress Notes (Signed)
                                                                                                                                                             Patient Name: Joanne Jackson MRN: [id number] DOB: 07-28-1975 Referring Physician: Elissa Hefty Date of Service: 07/31/2021 [facility], [facility]                                                        End Of Treatment Note  Diagnoses: C71.1-Malignant neoplasm of frontal lobe  Cancer Staging:  IDH mutant, High Grade Glioma of the right frontal lobe  Intent: Curative  Radiation Treatment Dates: 06/19/2021 through 07/31/2021 Site Technique Total Dose (Gy) Dose per Fx (Gy) Completed Fx Beam Energies  Brain: Brain IMRT 46/46 2 23/23 6X  Brain: Brain_Bst IMRT 14/14 2 7/7 6X   Narrative: The patient tolerated radiation therapy relatively well. She developed fatigue and one episode of emesis during therapy.   Plan: The patient will receive a call in about one month from the radiation oncology department. She will continue follow up with Dr. Barbaraann Cao as well.   ________________________________________________    Osker Mason, [facility]

## 2021-08-02 ENCOUNTER — Ambulatory Visit: Payer: 59 | Admitting: Occupational Therapy

## 2021-08-02 ENCOUNTER — Encounter: Payer: Self-pay | Admitting: Physical Therapy

## 2021-08-02 ENCOUNTER — Ambulatory Visit: Payer: 59 | Admitting: Physical Therapy

## 2021-08-02 ENCOUNTER — Encounter: Payer: Self-pay | Admitting: Internal Medicine

## 2021-08-02 ENCOUNTER — Telehealth: Payer: Self-pay | Admitting: Internal Medicine

## 2021-08-02 DIAGNOSIS — R41841 Cognitive communication deficit: Secondary | ICD-10-CM | POA: Diagnosis not present

## 2021-08-02 DIAGNOSIS — R278 Other lack of coordination: Secondary | ICD-10-CM

## 2021-08-02 DIAGNOSIS — M6281 Muscle weakness (generalized): Secondary | ICD-10-CM

## 2021-08-02 DIAGNOSIS — I69354 Hemiplegia and hemiparesis following cerebral infarction affecting left non-dominant side: Secondary | ICD-10-CM

## 2021-08-02 NOTE — Therapy (Addendum)
OUTPATIENT OCCUPATIONAL THERAPY NEURO EVALUATION  Patient Name: Joanne Jackson MRN: [id number] DOB:October 18, 1975, 46 y.o., female Today's Date: 08/02/2021  PCP: Leamon Arnt, MD REFERRING PROVIDER: Ventura Sellers, MD    Past Medical History:  Diagnosis Date   Glioma ([place]) 05/18/2021   Hypertension    Pre-diabetes    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[place])    Past Surgical History:  Procedure Laterality Date   CESAREAN SECTION     DILATION AND CURETTAGE, DIAGNOSTIC / THERAPEUTIC     Patient Active Problem List   Diagnosis Date Noted   Dyslipidemia 04/30/2021   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  Rationale for Evaluation and Treatment Rehabilitation  SUBJECTIVE:   SUBJECTIVE STATEMENT: Pt reports having weakness in LUE and LLE immediately out of surgery.  Pt then notes that prior to discharge she was moving fingers and toes a little bit.  Pt then received home health, and feels that she is practically back to baseline. Pt accompanied by: friend  PERTINENT HISTORY: R frontal glioma, HTN, sjogren's 04/29/21 brain MRI: No contrast enhancement at the site of signal abnormality in the right frontal lobe. This remains highly concerning for tumor, most likely a low-grade glioma  PRECAUTIONS: Fall  WEIGHT BEARING RESTRICTIONS Yes no lifting heavy items  PAIN:  Are you having pain? No  FALLS: Has patient fallen in last 6 months?  No  LIVING ENVIRONMENT: Lives with: lives with their family and husband and 75.11 year old daughter Lives in: House/apartment Stairs: Yes: Internal: 15 steps; first portion rail on R and then remainder rail on L and External: entry level steps; none Has following equipment at home: shower chair  PLOF: Independent  PATIENT GOALS to get evaluated and see how I am recovering: to be back to normal asap  OBJECTIVE:   HAND DOMINANCE: Right  ADLs: Transfers/ambulation related to ADLs: Independent Eating: Independent Grooming: Independent  UB Dressing: Independent LB Dressing: Independent Toileting: Independent Bathing: Independent - no longer using shower chair Tub Shower transfers: Independent Equipment: none   IADLs: Shopping: not alone, as she has not been cleared to drive Light housekeeping: Independent, does report gets tired very easily Meal Prep: small meals for self and/or daughter.  Is using stove top and oven Community mobility: relying on family/friends for transportation until cleared to drive Medication management: Mod I, uses alarm on her phone  Financial management: Independent  MOBILITY STATUS: Independent  POSTURE COMMENTS:  No Significant postural limitations   ACTIVITY TOLERANCE: Activity tolerance: WFL for tasks assessed during evaluation   UPPER EXTREMITY ROM     Active ROM Right eval Left eval  Shoulder flexion 150 140  Shoulder abduction WNL WNL  Shoulder adduction    Shoulder extension    Shoulder internal rotation WNL WNL  Shoulder external rotation WNL WNL  Elbow flexion WNL WNL  Elbow extension WNL WNL  Wrist flexion    Wrist extension    Wrist ulnar deviation    Wrist radial deviation    Wrist pronation  Wrist supination    (Blank rows = not tested)   UPPER EXTREMITY MMT:     MMT Right eval Left eval  Shoulder flexion 4/5 3+/5  Shoulder abduction    Shoulder adduction    Shoulder extension    Shoulder internal rotation     Shoulder external rotation    Middle trapezius    Lower trapezius    Elbow flexion 4/5 4/5  Elbow extension 4/5 4/5  Wrist flexion    Wrist extension    Wrist ulnar deviation    Wrist radial deviation    Wrist pronation    Wrist supination    (Blank rows = not tested)  HAND FUNCTION: Grip strength: Right: 34 lbs; Left: 21 lbs, Lateral pinch: Right: 14 lbs, Left: 12 lbs, and 3 point pinch: Right: 11 lbs, Left: 10 lbs  COORDINATION: Finger Nose Finger test: symmetrical bilaterally, slightly slower on L 9 Hole Peg test: Right: 21.91 sec; Left: 26.81 sec Box and Blocks:  Right 48 blocks, Left 38 blocks  SENSATION: WFL   COGNITION: Overall cognitive status: Within functional limits for tasks assessed  VISION: Subjective report: recently got updated prescription Baseline vision: Wears glasses all the time Visual history:  N/A  VISION ASSESSMENT: WFL  PERCEPTION: WFL  PRAXIS: WFL   TODAY'S TREATMENT:  N/A   PATIENT EDUCATION: Education details: Educated on role and purpose of OT as well as potential interventions and goals for therapy based on initial evaluation findings. Person educated: Patient Education method: Explanation Education comprehension: verbalized understanding   HOME EXERCISE PROGRAM: TBD    GOALS: Goals reviewed with patient? No   LONG TERM GOALS: Target date: 09/08/2021  Pt will demonstrate improved fine motor coordination for ADLs as evidenced by decreasing 9 hole peg test score for LUE by 3 secs Baseline: R: 21.91 sec and L: 26.81 sec Goal status: INITIAL  2.  Pt will demonstrate improved UE functional use for ADLs as evidenced by increasing box/ blocks score by 4 blocks with LUE Baseline: R: 48 and L: 38 blocks Goal status: INITIAL  3.  Pt will demonstrate improved LUE strength as needed to engage in ADLs and IADLs. Baseline: R: 34 and L:21 Goal status: INITIAL  4.  Pt will demonstrate improved functional reach in LUE to obtain  items at moderate range with LUE and/or BUE to engage in ADLs and IADLs. Baseline:  Goal status: INITIAL  5.  Pt will verbalize understanding of energy conservation strategies to increase independence and safety with ADLs and IADLs.  Baseline:  Goal status: INITIAL  ASSESSMENT:  CLINICAL IMPRESSION: Patient is a 46 y.o. female who was seen today for occupational therapy evaluation for LUE weakness s/p tumor resection.  Pt currently lives with spouse and 40 yo daughter in a 2 story home with entry level and bedroom on 2nd floor. PMHx includes R frontal glioma, HTN, sjogren's . Pt will benefit from skilled occupational therapy services to address strength and coordination, ROM, balance, GM/FM control, safety awareness, introduction of compensatory strategies/AE prn, and implementation of an HEP to improve participation and safety during ADLs and IADLs.  Marland Kitchen   PERFORMANCE DEFICITS in functional skills including ADLs, IADLs, coordination, ROM, strength, FMC, GMC, balance, endurance, decreased knowledge of precautions, decreased knowledge of use of DME, and UE functional use.  IMPAIRMENTS are limiting patient from ADLs and IADLs.   COMORBIDITIES may have co-morbidities  that affects occupational performance. Patient will benefit from skilled OT to address above impairments and improve overall function.  MODIFICATION OR ASSISTANCE TO COMPLETE EVALUATION: No modification of tasks or assist necessary to complete an evaluation.  OT OCCUPATIONAL PROFILE AND HISTORY: Detailed assessment: Review of records and additional review of physical, cognitive, psychosocial history related to current functional performance.  CLINICAL DECISION MAKING: LOW - limited treatment options, no task modification necessary  REHAB POTENTIAL: Good  EVALUATION COMPLEXITY: Low    PLAN: OT FREQUENCY: 1x/week  OT DURATION: 4 weeks  PLANNED INTERVENTIONS: self care/ADL training, therapeutic exercise, therapeutic  activity, neuromuscular re-education, balance training, functional mobility training, aquatic therapy, patient/family education, psychosocial skills training, energy conservation, coping strategies training, and DME and/or AE instructions  RECOMMENDED OTHER SERVICES: NA  CONSULTED AND AGREED WITH PLAN OF CARE: Patient  PLAN FOR NEXT SESSION: Engage in functional reaching task, initiate Coordination and hand strengthening HEP   Kaidan Harpster, [place], OTR/L 08/02/2021, 1:32 PM   OCCUPATIONAL THERAPY DISCHARGE SUMMARY  Visits from Start of Care: 1  Current functional level related to goals / functional outcomes: See above for eval documentation.  Pt did not return after initial eval.   Remaining deficits: See above.   Education / Equipment: Educated on role and purpose of OT with pt stating that she would decide if she felt it necessary or not   Patient agrees to discharge. Patient goals were not met. Patient is being discharged due to not returning since the last visit.Marland Kitchen

## 2021-08-02 NOTE — Telephone Encounter (Signed)
.  Called patient to schedule appointment per 6/27inbasket, patient is aware of date and time.   

## 2021-08-03 ENCOUNTER — Other Ambulatory Visit: Payer: Self-pay | Admitting: Internal Medicine

## 2021-08-03 MED ORDER — TEMAZEPAM 30 MG PO CAPS: 30.0000 mg | ORAL_CAPSULE | Freq: Every evening | ORAL | 3 refills | Status: DC | PRN

## 2021-08-04 ENCOUNTER — Encounter: Payer: Self-pay | Admitting: Family Medicine

## 2021-08-05 ENCOUNTER — Encounter: Payer: Self-pay | Admitting: Internal Medicine

## 2021-08-14 ENCOUNTER — Ambulatory Visit: Payer: 59 | Admitting: Family Medicine

## 2021-08-17 ENCOUNTER — Ambulatory Visit: Payer: 59 | Admitting: Internal Medicine

## 2021-08-17 ENCOUNTER — Other Ambulatory Visit: Payer: Self-pay | Admitting: *Deleted

## 2021-08-17 ENCOUNTER — Ambulatory Visit
Admission: RE | Admit: 2021-08-17 | Discharge: 2021-08-17 | Disposition: A | Discharge status: Home / Self Care | Payer: Self-pay | Source: Ambulatory Visit | Attending: Internal Medicine | Admitting: Internal Medicine

## 2021-08-17 ENCOUNTER — Inpatient Hospital Stay: Payer: 59 | Attending: Radiation Oncology | Admitting: Internal Medicine

## 2021-08-17 ENCOUNTER — Telehealth: Payer: Self-pay | Admitting: Pharmacist

## 2021-08-17 ENCOUNTER — Other Ambulatory Visit: Payer: Self-pay

## 2021-08-17 VITALS — BP 139/91 | HR 68 | Temp 98.1°F | Resp 17 | Ht 61.0 in | Wt 154.3 lb

## 2021-08-17 DIAGNOSIS — Z8249 Family history of ischemic heart disease and other diseases of the circulatory system: Secondary | ICD-10-CM | POA: Diagnosis not present

## 2021-08-17 DIAGNOSIS — G4730 Sleep apnea, unspecified: Secondary | ICD-10-CM | POA: Diagnosis not present

## 2021-08-17 DIAGNOSIS — Z79899 Other long term (current) drug therapy: Secondary | ICD-10-CM | POA: Diagnosis not present

## 2021-08-17 DIAGNOSIS — I10 Essential (primary) hypertension: Secondary | ICD-10-CM | POA: Diagnosis not present

## 2021-08-17 DIAGNOSIS — R251 Tremor, unspecified: Secondary | ICD-10-CM | POA: Diagnosis not present

## 2021-08-17 DIAGNOSIS — M3500 Sicca syndrome, unspecified: Secondary | ICD-10-CM | POA: Diagnosis not present

## 2021-08-17 DIAGNOSIS — C719 Malignant neoplasm of brain, unspecified: Secondary | ICD-10-CM

## 2021-08-17 DIAGNOSIS — R112 Nausea with vomiting, unspecified: Secondary | ICD-10-CM | POA: Diagnosis not present

## 2021-08-17 DIAGNOSIS — Z79630 Long term (current) use of alkylating agent: Secondary | ICD-10-CM | POA: Insufficient documentation

## 2021-08-17 DIAGNOSIS — C711 Malignant neoplasm of frontal lobe: Secondary | ICD-10-CM | POA: Diagnosis not present

## 2021-08-17 DIAGNOSIS — G4700 Insomnia, unspecified: Secondary | ICD-10-CM | POA: Diagnosis not present

## 2021-08-17 DIAGNOSIS — Z8349 Family history of other endocrine, nutritional and metabolic diseases: Secondary | ICD-10-CM | POA: Diagnosis not present

## 2021-08-17 MED ORDER — TEMOZOLOMIDE 20 MG PO CAPS: 20.0000 mg | ORAL_CAPSULE | Freq: Every day | ORAL | 0 refills | Status: DC

## 2021-08-17 MED ORDER — LEVETIRACETAM 500 MG PO TABS: 500.0000 mg | ORAL_TABLET | Freq: Two times a day (BID) | ORAL | 2 refills | Status: DC

## 2021-08-17 MED ORDER — ONDANSETRON HCL 8 MG PO TABS: 8.0000 mg | ORAL_TABLET | Freq: Two times a day (BID) | ORAL | 1 refills | Status: DC | PRN

## 2021-08-17 MED ORDER — TEMOZOLOMIDE 100 MG PO CAPS: 100.0000 mg | ORAL_CAPSULE | Freq: Every day | ORAL | 0 refills | Status: DC

## 2021-08-17 MED ORDER — TEMOZOLOMIDE 140 MG PO CAPS: 140.0000 mg | ORAL_CAPSULE | Freq: Every day | ORAL | 0 refills | Status: DC

## 2021-08-17 NOTE — Progress Notes (Signed)
[facility] [place] [place], [place] [address] [phone]   Interval Evaluation  Date of Service: 08/17/21 Patient Name: Joanne Jackson Patient MRN: [id number] Patient DOB: May 06, 1975 Provider: Ventura Sellers, MD  Identifying Statement:  Macon Lesesne is a 46 y.o. 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 Tommi Rumps at [facility]; path demonstrates High Grade Glioma [id number].   06/19/2021 -  Chemotherapy   Patient is on Treatment Plan : BRAIN GLIOBLASTOMA Radiation Therapy With Concurrent Temozolomide 75 mg/m2 Daily Followed By Sequential Maintenance Temozolomide x 6-12 cycles       Biomarkers:  MGMT Unknown.  IDH 1/2 Mutated.  EGFR Unknown  TERT Unmutated   Interval History: Joanne Jackson presents today for follow up, now having completed IMRT and Temodar.  No new or progressive complaints.  She now has CPAP mask on the way after home sleep study demonstrated impairments in breathing, sleep apnea.  She met with [facility] team yesterday to review the MRI scan.  No seizures or headaches.    H+P (06/12/21) Patient presented to medical attention in March 2023 with new onset seizures, characterized by shaking of the left hand without impaired awareness.  CNS imaging demonstrated non enhancing mass within right frontal lobe, c/w primary neoplasm.  She underwent craniotomy, resection on 05/18/21 with Dr. Tommi Rumps at [facility]; pathology was High Grade Glioma IDHmt.  Since surgery, husband has noticed more blunted (depressed?) mood, less responsive and engaging.  Otherwise she is fully independent and functional.  Referred to [facility] from [facility] for local implementation of radiation and chemotherapy.  Medications: Current Outpatient Medications on File Prior to Visit  Medication Sig Dispense  Refill   Cholecalciferol (VITAMIN D3) 50 MCG (2000 UT) TABS Take 2,000 Units by mouth daily as needed.      ferrous sulfate 324 (65 Fe) MG TBEC TAKE 1 TABLET BY MOUTH EVERY DAY (Patient taking differently: Take 324 mg by mouth daily.) 90 tablet 1   levETIRAcetam (KEPPRA) 750 MG tablet Take 1,500 mg by mouth 2 (two) times daily.     lisinopril (ZESTRIL) 40 MG tablet Take 1 tablet (40 mg total) by mouth daily. (Patient taking differently: Take 20 mg by mouth daily.) 90 tablet 3   omeprazole (PRILOSEC) 20 MG capsule Take 1 capsule (20 mg total) by mouth daily. (Patient not taking: Reported on 08/01/2021) 60 capsule 2   ondansetron (ZOFRAN) 8 MG tablet Take 1 tablet (8 mg total) by mouth 2 (two) times daily as needed (nausea and vomiting). May take 30-60 minutes prior to Temodar administration if nausea/vomiting occurs. 30 tablet 1   RESTASIS 0.05 % ophthalmic emulsion Place 1 drop into both eyes 2 (two) times daily.     temazepam (RESTORIL) 30 MG capsule Take 1 capsule (30 mg total) by mouth at bedtime as needed for sleep. 30 capsule 3   temozolomide (TEMODAR) 100 MG capsule TAKE 1 CAPSULE (100 MG TOTAL) BY MOUTH DAILY. MAY TAKE ON AN EMPTY STOMACH TO DECREASE NAUSEA & VOMITING. 12 capsule 0   temozolomide (TEMODAR) 20 MG capsule TAKE 1 CAPSULE (20 MG TOTAL) BY MOUTH DAILY. MAY TAKE ON AN EMPTY STOMACH TO DECREASE NAUSEA & VOMITING. 12 capsule 0   temozolomide (TEMODAR) 5 MG capsule TAKE 2 CAPSULES (10 MG TOTAL) BY MOUTH DAILY. MAY TAKE ON  AN EMPTY STOMACH TO DECREASE NAUSEA & VOMITING. 24 capsule 0   traZODone (DESYREL) 50 MG tablet TAKE 1 TABLET BY MOUTH EVERYDAY AT BEDTIME (Patient taking differently: as needed.) 90 tablet 1   No current facility-administered medications on file prior to visit.    Allergies: No Known Allergies Past Medical History:  Past Medical History:  Diagnosis Date   Glioma ([place]) 05/18/2021   Hypertension    Pre-diabetes    Sjogren's syndrome with keratoconjunctivitis  sicca ([facility])    Past Surgical History:  Past Surgical History:  Procedure Laterality Date   CESAREAN SECTION     DILATION AND CURETTAGE, DIAGNOSTIC / THERAPEUTIC     Social History:  Social History   Socioeconomic History   Marital status: Married    Spouse name: Not on file   Number of children: 1   Years of education: Not on file   Highest education level: Not on file  Occupational History   Occupation: Accounting  Tobacco Use   Smoking status: Never   Smokeless tobacco: Never  Vaping Use   Vaping Use: Never used  Substance and Sexual Activity   Alcohol use: Not Currently   Drug use: Never   Sexual activity: Yes    Partners: Male    Birth control/protection: Other-see comments, Condom    Comment: husband is infertile  Other Topics Concern   Not on file  Social History Narrative   Not on file   Social Determinants of Health   Financial Resource Strain: Not on file  Food Insecurity: Not on file  Transportation Needs: Not on file  Physical Activity: Not on file  Stress: Not on file  Social Connections: Not on file  Intimate Partner Violence: Not on file   Family History:  Family History  Problem Relation Age of Onset   Thyroid disease Mother    Heart attack Father 26   Hypertension Father    Healthy Brother    Healthy Daughter    Healthy Paternal Grandfather    Colon cancer Neg Hx    Cancer Neg Hx    Stroke Neg Hx     Review of Systems: Constitutional: Doesn't report fevers, chills or abnormal weight loss Eyes: Doesn't report blurriness of vision Ears, nose, mouth, throat, and face: Doesn't report sore throat Respiratory: Doesn't report cough, dyspnea or wheezes Cardiovascular: Doesn't report palpitation, chest discomfort  Gastrointestinal:  Doesn't report nausea, constipation, diarrhea GU: Doesn't report incontinence Skin: Doesn't report skin rashes Neurological: Per HPI Musculoskeletal: Doesn't report joint pain Behavioral/Psych: Doesn't report  anxiety  Physical Exam: Vitals:   08/17/21 1402  BP: (!) 139/91  Pulse: 68  Resp: 17  Temp: 98.1 F (36.7 C)  SpO2: 98%    KPS: 90. General: Alert, cooperative, pleasant, in no acute distress Head: Normal EENT: No conjunctival injection or scleral icterus.  Lungs: Resp effort normal Cardiac: Regular rate Abdomen: Non-distended abdomen Skin: No rashes cyanosis or petechiae. Extremities: No clubbing or edema  Neurologic Exam: Mental Status: Awake, alert, attentive to examiner. Oriented to self and environment. Language is fluent with intact comprehension. Abulic features noted. Cranial Nerves: Visual acuity is grossly normal. Visual fields are full. Extra-ocular movements intact. No ptosis. Face is symmetric Motor: Tone and bulk are normal. Power is full in both arms and legs. Reflexes are symmetric, no pathologic reflexes present.  Sensory: Intact to light touch Gait: Normal.   Labs: I have reviewed the data as listed    Component Value Date/Time   NA 137  07/31/2021 1336   K 3.7 07/31/2021 1336   CL 106 07/31/2021 1336   CO2 23 07/31/2021 1336   GLUCOSE 133 (H) 07/31/2021 1336   BUN 7 07/31/2021 1336   CREATININE 0.69 07/31/2021 1336   CREATININE 0.50 11/24/2019 1449   CALCIUM 9.3 07/31/2021 1336   PROT 6.8 07/31/2021 1336   ALBUMIN 4.1 07/31/2021 1336   AST 16 07/31/2021 1336   ALT 16 07/31/2021 1336   ALKPHOS 45 07/31/2021 1336   BILITOT 0.6 07/31/2021 1336   GFRNONAA >60 07/31/2021 1336   GFRNONAA 119 11/24/2019 1449   GFRAA 138 11/24/2019 1449   Lab Results  Component Value Date   WBC 3.2 (L) 07/31/2021   NEUTROABS 2.4 07/31/2021   HGB 13.3 07/31/2021   HCT 38.2 07/31/2021   MCV 84.9 07/31/2021   PLT 183 07/31/2021    Imaging:    Assessment/Plan 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  MRI brain demonstrated essentially stable findings or mild  post-radiation effects.  We recommended initiating treatment with cycle #1 Temozolomide 150 mg/m2, on for five days and off for twenty three days in twenty eight day cycles. The patient will have a complete blood count performed on days 21 and 28 of each cycle, and a comprehensive metabolic panel performed on day 28 of each cycle. Labs may need to be performed more often. Zofran will prescribed for home use for nausea/vomiting.   Informed consent was obtained verbally at bedside to proceed with oral chemotherapy.  Chemotherapy should be held for the following:  ANC less than 1,000  Platelets less than 100,000  LFT or creatinine greater than 2x ULN  If clinical concerns/contraindications develop  May con't Trazodone $RemoveBeforeD'50mg'[id number]$  HS.  We will add on restoril $RemoveBef'15mg'oCtwrUgDBa$  HS given refractory insomnia.  Keppra will decrease to $RemoveBef'500mg'xSospzKxuF$  BID due to mood effects.  Can consider transition to Lamictal in 1 month if issues persist.  We appreciate the opportunity to participate in the care of Laurena Diem.   We ask that Bitania Shankland return to clinic in 1 months prior to cycle #2 with labs for evaluation, or sooner as needed.  All questions were answered. The patient knows to call the clinic with any problems, questions or concerns. No barriers to learning were detected.  The total time spent in the encounter was 30 minutes and more than 50% was on counseling and review of test results   Ventura Sellers, MD Medical Director of Neuro-Oncology [facility] [place] 08/17/21 2:02 PM

## 2021-08-17 NOTE — Telephone Encounter (Signed)
Oral Oncology Pharmacist Encounter  Received new prescription for Temodar (temozolomide) for the maintenance treatment of high grade glioma, IDH mutated, planned duration 6-12 cycles. First cycle of maintenance planned to start 08/28/21.  CBC w/ Diff and CMP from 07/31/21 assessed, no baseline dose adjustments required. Prescription dose and frequency assessed for appropriateness.  Current medication list in Epic reviewed, no relevant/significant DDIs with Temodar identified.  Evaluated chart and no patient barriers to medication adherence noted.   Patient agreement for treatment documented in MD note on 08/17/21.  Prescription has been e-scribed to [facility] due to patient's insurance requirements.   Oral Oncology Clinic will continue to follow for insurance authorization, copayment issues, initial counseling and start date.  Leron Croak, PharmD, BCPS, [facility] Hematology/Oncology Clinical Pharmacist [facility] and [place] [phone] 08/17/2021 2:54 PM

## 2021-08-17 NOTE — Progress Notes (Signed)
DISCONTINUE ON PATHWAY REGIMEN - Neuro ? ? ?  One cycle, concurrent with RT: ?    Temozolomide  ? ?**Always confirm dose/schedule in your pharmacy ordering system** ? ?REASON: Continuation Of Treatment ?PRIOR TREATMENT: [id number]: Radiation Therapy with Concurrent Temozolomide 75 mg/m2 Daily x 6 Weeks, Followed by Adjuvant Temozolomide ?TREATMENT RESPONSE: Stable Disease (SD) ? ?START ON PATHWAY REGIMEN - Neuro ? ? ?  A cycle is every 28 days: ?    Temozolomide  ?    Temozolomide  ? ?**Always confirm dose/schedule in your pharmacy ordering system** ? ?Patient Characteristics: ?Glioma, Glioblastoma, IDH-wildtype, Newly Diagnosed / Treatment Naive, Good Performance Status and/or Younger Patient, MGMT Promoter Unmethylated/Unknown ?Disease Classification: Glioma ?Disease Classification: Glioblastoma, IDH-wildtype ?Disease Status: Newly Diagnosed / Treatment Naive ?Performance Status: Good Performance Status and/or Younger Patient ?MGMT Promoter Methylation Status: Awaiting Test Results ?Intent of Therapy: ?Non-Curative / Palliative Intent, Discussed with Patient ?

## 2021-08-17 NOTE — Progress Notes (Signed)
Office Visit Note  Patient: Joanne Jackson             Date of Birth: [birth year]           MRN: [id number]             PCP: Leamon Arnt, MD Referring: Leamon Arnt, MD Visit Date: 08/31/2021 Occupation: '@GUAROCC'$ @  Subjective:  Dry mouth and dry eyes  History of Present Illness: Joanne Jackson is a 46 y.o. female with history of Sjogren's syndrome.  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  She went to [facility] for resection of the brain tumor.  She states after the surgery she underwent radiation therapy and chemotherapy.  She is still getting chemotherapy.  She will transfer future care to [place].  She continues to have dry mouth and dry eyes.  She has not experienced much paresthesias in her hands.  She states her knee joint pain is better.  She has been using some over-the-counter products for Sjogren's.  Activities of Daily Living:  Patient reports morning stiffness for 0 minutes.   Patient Denies nocturnal pain.  Difficulty dressing/grooming: Denies Difficulty climbing stairs: Denies Difficulty getting out of chair: Denies Difficulty using hands for taps, buttons, cutlery, and/or writing: Denies  Review of Systems  Constitutional:  Positive for fatigue.  HENT:  Positive for mouth dryness. Negative for mouth sores.   Eyes:  Negative for dryness.  Respiratory:  Negative for shortness of breath.   Cardiovascular:  Positive for palpitations. Negative for chest pain.  Gastrointestinal:  Positive for constipation. Negative for blood in stool and diarrhea.  Endocrine: Negative for increased urination.  Genitourinary:  Negative for involuntary urination.  Musculoskeletal:  Negative for joint pain, joint pain, joint swelling, myalgias, muscle weakness, morning stiffness, muscle tenderness and myalgias.   Skin:  Positive for sensitivity to sunlight. Negative for color change and rash.  Allergic/Immunologic: Negative for susceptible to infections.  Neurological:  Negative for dizziness and headaches.  Hematological:  Negative for swollen glands.  Psychiatric/Behavioral:  Positive for sleep disturbance. Negative for depressed mood. The patient is not nervous/anxious.     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[place]) 04/30/2021   Episode of transient neurologic symptoms 04/28/2021   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[place]) 10/25/2020   History of gestational diabetes 10/25/2020   Iron deficiency anemia 12/03/2019   Tubular adenoma of colon 10/24/2018   Essential hypertension 12/26/2017   Prediabetes 12/26/2017   Intramural leiomyoma of uterus 07/29/2015    Past Medical History:  Diagnosis Date   Glioma ([place]) 05/18/2021   Hypertension    Pre-diabetes    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[place])     Family History  Problem Relation Age of Onset   Thyroid disease Mother    Heart attack Father 15   Hypertension Father    Healthy Brother    Healthy Daughter    Healthy Paternal Grandfather    Colon cancer Neg Hx    Cancer Neg Hx    Stroke Neg Hx    Past Surgical History:  Procedure Laterality Date   BRAIN SURGERY  05/2021   tumor removal  CESAREAN SECTION     DILATION AND CURETTAGE, DIAGNOSTIC / THERAPEUTIC     Social History   Social History Narrative   Not on file   Immunization History  Administered Date(s) Administered   Influenza,inj,Quad PF,6+ Mos 11/11/2015, 10/24/2018, 12/02/2019, 10/25/2020   Moderna Sars-Covid-2 Vaccination 05/29/2019, 06/19/2019   PFIZER(Purple Top)SARS-COV-2 Vaccination 01/19/2020   Tdap 10/14/2015     Objective: Vital Signs: BP 122/84 (BP Location: Left Arm, Patient  Position: Sitting, Cuff Size: Normal)   Pulse 70   Ht '4\' 11"'$  (1.499 m)   Wt 151 lb 6.4 oz (68.7 kg)   BMI 30.58 kg/m    Physical Exam Vitals and nursing note reviewed.  Constitutional:      Appearance: She is well-developed.  HENT:     Head: Normocephalic and atraumatic.  Eyes:     Conjunctiva/sclera: Conjunctivae normal.  Cardiovascular:     Rate and Rhythm: Normal rate and regular rhythm.     Heart sounds: Normal heart sounds.  Pulmonary:     Effort: Pulmonary effort is normal.     Breath sounds: Normal breath sounds.  Abdominal:     General: Bowel sounds are normal.     Palpations: Abdomen is soft.  Musculoskeletal:     Cervical back: Normal range of motion.  Lymphadenopathy:     Cervical: No cervical adenopathy.  Skin:    General: Skin is warm and dry.     Capillary Refill: Capillary refill takes less than 2 seconds.  Neurological:     Mental Status: She is alert and oriented to person, place, and time.  Psychiatric:        Behavior: Behavior normal.      Musculoskeletal Exam: C-spine was in good range of motion.  Shoulder joints, elbow joints, wrist joints, MCPs PIPs and DIPs with good range of motion with no synovitis.  Hip joints, knee joints, ankles, MTPs and PIPs with good range of motion with no synovitis.  CDAI Exam: CDAI Score: -- Patient Global: --; Provider Global: -- Swollen: --; Tender: -- Joint Exam 08/31/2021   No joint exam has been documented for this visit   There is currently no information documented on the homunculus. Go to the Rheumatology activity and complete the homunculus joint exam.  Investigation: No additional findings.  Imaging: No results found.  Recent Labs: Lab Results  Component Value Date   WBC 3.2 (L) 07/31/2021   HGB 13.3 07/31/2021   PLT 183 07/31/2021   NA 137 07/31/2021   K 3.7 07/31/2021   CL 106 07/31/2021   CO2 23 07/31/2021   GLUCOSE 133 (H) 07/31/2021   BUN 7 07/31/2021   CREATININE 0.69 07/31/2021    BILITOT 0.6 07/31/2021   ALKPHOS 45 07/31/2021   AST 16 07/31/2021   ALT 16 07/31/2021   PROT 6.8 07/31/2021   ALBUMIN 4.1 07/31/2021   CALCIUM 9.3 07/31/2021   GFRAA 138 11/24/2019    Speciality Comments: No specialty comments available.  Procedures:  No procedures performed Allergies: Patient has no known allergies.   Assessment / Plan:     Visit Diagnoses: 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H[facility]) - ANA 1:160 NS, RF<14, sed rate 26, Ro+:  -She continues to have sicca symptoms.  We had a detailed discussion regarding over-the-counter products for dry eyes and dry mouth.  Labs from July 31, 2021 were reviewed which showed white cell count 3.2, hemoglobin 13.3 and platelets were normal.  CMP was normal.  Will obtain UA today.  Plan: Urinalysis,  Routine w reflex microscopic  Paresthesia of both hands-she was having paresthesias in her hands at night.  She states that the paresthesias have improved.  Chronic pain of both knees - X-rays of both knee joints were unremarkable on 11/25/18.  The knee joint pain has improved.  Other fatigue-she continues to have a lot of fatigue.  Vitamin D deficiency-her vitamin D was low in September 2022 at 31.26.  She has been on vitamin D supplements.  Prediabetes  Tubular adenoma of colon  Essential hypertension-blood pressure was normal today.  Glioma of brain [facility]) - Diagnosed in March 2023.  She had surgery at [facility] followed by chemotherapy and radiation therapy.  She will be receiving future chemotherapy at [place].  Orders: Orders Placed This Encounter  Procedures   Urinalysis, Routine w reflex microscopic   No orders of the defined types were placed in this encounter.   Face-to-face time spent patient was 30 minutes.  More than 50% time was spent in counseling and coordination of care.  Follow-Up Instructions: Return in about 6 months (around 03/03/2022) for Sjogren's.   Bo Merino, MD  Note -  This record has been created using Editor, commissioning.  Chart creation errors have been sought, but may not always  have been located. Such creation errors do not reflect on  the standard of medical care.

## 2021-08-18 ENCOUNTER — Encounter: Payer: Self-pay | Admitting: Internal Medicine

## 2021-08-21 ENCOUNTER — Encounter: Payer: Self-pay | Admitting: Family Medicine

## 2021-08-21 ENCOUNTER — Ambulatory Visit (INDEPENDENT_AMBULATORY_CARE_PROVIDER_SITE_OTHER): Payer: 59 | Admitting: Family Medicine

## 2021-08-21 VITALS — BP 122/86 | HR 71 | Temp 99.0°F | Ht 61.0 in | Wt 152.6 lb

## 2021-08-21 DIAGNOSIS — E785 Hyperlipidemia, unspecified: Secondary | ICD-10-CM

## 2021-08-21 DIAGNOSIS — R7303 Prediabetes: Secondary | ICD-10-CM

## 2021-08-21 DIAGNOSIS — I10 Essential (primary) hypertension: Secondary | ICD-10-CM | POA: Diagnosis not present

## 2021-08-21 DIAGNOSIS — Z0000 Encounter for general adult medical examination without abnormal findings: Secondary | ICD-10-CM | POA: Diagnosis not present

## 2021-08-21 DIAGNOSIS — C719 Malignant neoplasm of brain, unspecified: Secondary | ICD-10-CM

## 2021-08-21 DIAGNOSIS — G4709 Other insomnia: Secondary | ICD-10-CM | POA: Insufficient documentation

## 2021-08-21 LAB — LIPID PANEL
Cholesterol: 173 mg/dL (ref 0–200)
HDL: 45.5 mg/dL (ref >39.00)
LDL Cholesterol: 98 mg/dL (ref 0–99)
NonHDL: 127.94
Total CHOL/HDL Ratio: 4
Triglycerides: 149 mg/dL (ref 0.0–149.0)
VLDL: 29.8 mg/dL (ref 0.0–40.0)

## 2021-08-21 LAB — TSH: TSH: 0.78 u[IU]/mL (ref 0.35–5.50)

## 2021-08-21 LAB — HEMOGLOBIN A1C: Hgb A1c MFr Bld: 6.2 % (ref 4.6–6.5)

## 2021-08-21 MED ORDER — LISINOPRIL 40 MG PO TABS: 20.0000 mg | ORAL_TABLET | Freq: Every day | ORAL | Status: DC

## 2021-08-21 MED ORDER — TRAZODONE HCL 50 MG PO TABS: 50.0000 mg | ORAL_TABLET | Freq: Every evening | ORAL | Status: DC | PRN

## 2021-08-21 NOTE — Patient Instructions (Signed)
Please return in 6 months for recheck  I will release your lab results to you on your MyChart account with further instructions. You may see the results before I do, but when I review them I will send you a message with my report or have my assistant call you if things need to be discussed. Please reply to my message with any questions. Thank you!   I hope all goes well for you with your treatments. Please call me if you need anything.   If you have any questions or concerns, please don't hesitate to send me a message via MyChart or call the office at [phone]. Thank you for visiting with [place] today! It's our pleasure caring for you. 2

## 2021-08-21 NOTE — Progress Notes (Signed)
Subjective  Chief Complaint  Patient presents with   Hypertension    Pt her to F/U with BP    HPI: Joanne Jackson is a 46 y.o. female who presents to [facility] [place] today for a Female Wellness Visit. She also has the concerns and/or needs as listed above in the chief complaint. These will be addressed in addition to the Health Maintenance Visit.   Wellness Visit: annual visit with health maintenance review and exam without Pap  46 year old with high-grade glioma being treated by oncology.  Reviewed oncology notes.  Here for complete physical.  Screens are current.  Fortunately, she is doing okay.  To start chemotherapy again next week.  Reports she is coping with the stress well overall. Chronic disease f/u and/or acute problem visit: (deemed necessary to be done in addition to the wellness visit): Hypertension: Since treatment, blood pressure has been running low.  She is been off lisinopril for 2 weeks now and blood pressure remains stable.  No chest pain or shortness of breath. History of dyslipidemia and prediabetes.  No symptoms of hyperglycemia.  Due for recheck.  Monitoring CBC and CMP with chemo and blood sugars are running normal there.  LFTs have been elevated in the past but currently are normal.  Overall she feels tired. Secondary insomnia: Trial of trazodone per oncology.  Has not been helpful.  Admits to stressors keeping her awake.  Has only tried 50 mg nightly.  Assessment  1. Annual physical exam   2. Essential hypertension   3. 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 Dyslipidemia   5. Prediabetes      Plan  Female Wellness Visit: Age appropriate Health Maintenance and Prevention measures were discussed with patient. Included topics are cancer screening recommendations, ways to keep healthy (see AVS) including dietary and exercise recommendations, regular eye and dental care, use of seat belts, and avoidance of moderate alcohol  use and tobacco use.  BMI: discussed patient's BMI and encouraged positive lifestyle modifications to help get to or maintain a target BMI. HM needs and immunizations were addressed and ordered. See below for orders. See HM and immunization section for updates. Routine labs and screening tests ordered including cmp, cbc and lipids where appropriate. Discussed recommendations regarding Vit D and calcium supplementation (see AVS)  Chronic disease management visit and/or acute problem visit: Hypertension: Stop lisinopril and monitor closely.  She is undergoing daily chemotherapy so we will have her blood pressure checked daily.  She will notify me if blood pressures are elevating.  Will restart at lisinopril 10 mg daily.  She had been on 40 in the past.  Renal function stable.  Potassium was normal. Recheck lipids and sugars. Increase trazodone to 100 mg nightly to see if that helps her sleep.  Follow up: 6 months to recheck blood pressure sooner if needed Orders Placed This Encounter  Procedures   Lipid panel   TSH   Hemoglobin A1c   Meds ordered this encounter  Medications   DISCONTD: lisinopril (ZESTRIL) 40 MG tablet    Sig: Take 0.5 tablets (20 mg total) by mouth daily.   traZODone (DESYREL) 50 MG tablet    Sig: Take 1-2 tablets (50-100 mg total) by mouth at bedtime as needed for sleep.      Body mass index is 28.83 kg/m. Wt Readings from Last 3 Encounters:  08/21/21 152 lb 9.6 oz (69.2 kg)  08/17/21 154 lb 4.8 oz (70 kg)  07/31/21 154 lb 6.4 oz (70 kg)     Patient Active Problem List   Diagnosis Date Noted   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[place]) 10/25/2020    Priority: High   Tubular adenoma of colon 10/24/2018    Priority: High    Repeat cscope in 2027.      Essential hypertension 12/26/2017    Priority: High    Diagnosed in 2012.      Iron deficiency anemia 12/03/2019    Priority: Medium     dx'd 11/2019.      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[place]) 04/30/2021   Episode of transient neurologic symptoms 04/28/2021   Health Maintenance  Topic Date Due   INFLUENZA VACCINE  09/05/2021   MAMMOGRAM  01/16/2022   PAP SMEAR-Modifier  01/27/2024   COLONOSCOPY (Pts 45-37yr Insurance coverage will need to be confirmed)  09/22/2025   TETANUS/TDAP  10/13/2025   Hepatitis C Screening  Completed   HIV Screening  Completed   HPV VACCINES  Aged Out   COVID-19 Vaccine  Discontinued   Immunization History  Administered Date(s) Administered   Influenza,inj,Quad PF,6+ Mos 11/11/2015, 10/24/2018, 12/02/2019, 10/25/2020   Moderna Sars-Covid-2 Vaccination 05/29/2019, 06/19/2019   PFIZER(Purple Top)SARS-COV-2 Vaccination 01/19/2020   Tdap 10/14/2015   We updated and reviewed the patient's past history in detail and it is documented below. Allergies: Patient has No Known Allergies. Past Medical History Patient  has a past medical history of [place]), Hypertension, Pre-diabetes, and 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[place]. Past Surgical History Patient  has a past surgical history that includes Cesarean section and Dilation and curettage, diagnostic / therapeutic. Family History: Patient family history includes Healthy in her brother, daughter, and paternal grandfather; Heart attack (age of onset: 555 in her father; Hypertension in her father; Thyroid disease in her mother. Social History:  Patient  reports that she has never smoked. She has never used smokeless tobacco. She reports that she does not currently use alcohol. She reports that she does not use drugs.  Review of Systems: Constitutional: negative for fever or malaise Ophthalmic: negative for photophobia, double vision or loss of  vision Cardiovascular: negative for chest pain, dyspnea on exertion, or new LE swelling Respiratory: negative for SOB or persistent cough Gastrointestinal: negative for abdominal pain, change in bowel habits or melena Genitourinary: negative for dysuria or gross hematuria, no abnormal uterine bleeding or disharge Musculoskeletal: negative for new gait disturbance or muscular weakness Integumentary: negative for new or persistent rashes, no breast lumps Neurological: negative for TIA or stroke symptoms Psychiatric: negative for SI or delusions Allergic/Immunologic: negative for hives  Patient Care Team    Relationship Specialty Notifications Start End  ALeamon Arnt MD PCP - General Family Medicine  10/25/20   LPrincess Bruins MD Consulting Physician Obstetrics and Gynecology  10/24/18   DBo Merino MD Consulting Physician Rheumatology  10/25/20     Objective  Vitals: BP 122/86   Pulse 71   Temp 99 F (37.2 C)   Ht '5\' 1"'$  (1.549 m)   Wt 152 lb 9.6 oz (69.2 kg)   SpO2 96%   BMI 28.83 kg/m  General:  Well developed, well nourished, no acute distress  Psych:  Alert and orientedx3, flat mood  and affect HEENT:  Normocephalic, atraumatic, non-icteric sclera,  supple neck without adenopathy, mass or thyromegaly Cardiovascular:  Normal S1, S2, RRR without gallop, rub or murmur Respiratory:  Good breath sounds bilaterally, CTAB with normal respiratory effort Gastrointestinal: normal bowel sounds, soft, non-tender, no noted masses. No HSM MSK: no deformities, contusions. Joints are without erythema or swelling.  Skin:  Warm, no rashes or suspicious lesions noted Neurologic:    Mental status is normal.Gross motor and sensory exams are normal. Normal gait. No tremor  Commons side effects, risks, benefits, and alternatives for medications and treatment plan prescribed today were discussed, and the patient expressed understanding of the given instructions. Patient is instructed to  call or message via MyChart if he/she has any questions or concerns regarding our treatment plan. No barriers to understanding were identified. We discussed Red Flag symptoms and signs in detail. Patient expressed understanding regarding what to do in case of urgent or emergency type symptoms.  Medication list was reconciled, printed and provided to the patient in AVS. Patient instructions and summary information was reviewed with the patient as documented in the AVS. This note was prepared with assistance of Dragon voice recognition software. Occasional wrong-word or sound-a-like substitutions may have occurred due to the inherent limitations of voice recognition software  This visit occurred during the SARS-CoV-2 public health emergency.  Safety protocols were in place, including screening questions prior to the visit, additional usage of staff PPE, and extensive cleaning of exam room while observing appropriate contact time as indicated for disinfecting solutions.

## 2021-08-21 NOTE — Telephone Encounter (Signed)
Oral Chemotherapy Pharmacist Encounter  I spoke with patient's husband for overview of: Temodar (temozolomide) for the maintenance treatment of high grade glioma, IDH mutated, planned duration 6-12 cycles  Counseled husband on administration, dosing, side effects, monitoring, drug-food interactions, safe handling, storage, and disposal.  Patient will take Temodar 116m capsules, Temodar 1057mcapsules, and Temodar 20 mg capsules, 26038motal daily dose, by mouth once daily, may take at bedtime and on an empty stomach to decrease nausea and vomiting.  If 1st cycle is well tolerated, patient's husband informed that Temodar dose may be increased to 200 mg/m2 daily for 5 days on, 23 days off, repeated every 28 days for subsequent cycles   Patient will take Temodar daily for 5 days on, 23 days off, and repeated.  Temodar start date: 08/28/21   Patient will take Zofran 8mg14mblet, 1 tablet by mouth 30-60 min prior to Temodar dose to help decrease N/V. We discussed strategies to manage constipation if they occur secondary to ondansetron dosing.   Adverse effects include but are not limited to: nausea, vomiting, anorexia, GI upset, rash, drug fever, and fatigue.   Rare but serious adverse effects of pneumocystis pneumonia and secondary malignancy also discussed. PCP prophylaxis will not be initiated at this time, but may be added based on lymphocyte count in the future.  Reviewed importance of keeping a medication schedule and plan for any missed doses. No barriers to medication adherence identified.  Medication reconciliation performed and medication/allergy list updated.  Insurance authorization for Temodar has been obtained. [facility].   All questions answered.  Patient's husband voiced understanding and appreciation.   Medication education handout placed in mail for patient and  patient's family. Family knows to call the office with questions or concerns. Oral Chemotherapy Clinic phone number provided.  RebeLeron CroakarmD, BCPS, BCOPHolyoke Medical Centeratology/Oncology Clinical Pharmacist [place]vina [place]-[phone]/2023 12:09 PM

## 2021-08-24 ENCOUNTER — Other Ambulatory Visit: Payer: Self-pay | Admitting: Internal Medicine

## 2021-08-24 ENCOUNTER — Encounter: Payer: Self-pay | Admitting: Internal Medicine

## 2021-08-28 ENCOUNTER — Other Ambulatory Visit: Payer: Self-pay

## 2021-08-29 ENCOUNTER — Ambulatory Visit: Payer: 59 | Attending: Neurological Surgery

## 2021-08-29 DIAGNOSIS — R41841 Cognitive communication deficit: Secondary | ICD-10-CM | POA: Diagnosis not present

## 2021-08-29 NOTE — Therapy (Signed)
OUTPATIENT SPEECH LANGUAGE PATHOLOGY TREATMENT SESSION   Patient Name: Joanne Jackson MRN: [id number] DOB:1975-09-02, 46 y.o., female Today's Date: 08/29/2021  PCP: Leamon Arnt., MD REFERRING PROVIDER: Kathyrn Drown, MD    End of Session - 08/29/21 1638     Visit Number 2    Number of Visits 25    Date for SLP Re-Evaluation 10/30/21    Authorization - Number of Visits 17    SLP Start Time 1450    SLP Stop Time  2130    SLP Time Calculation (min) 42 min    Activity Tolerance Patient tolerated treatment well              Past Medical History:  Diagnosis Date   Glioma ([place]) 05/18/2021   Hypertension    Pre-diabetes    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[place])    Past Surgical History:  Procedure Laterality Date   CESAREAN SECTION     DILATION AND CURETTAGE, DIAGNOSTIC / THERAPEUTIC     Patient Active Problem List   Diagnosis Date Noted   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   Dyslipidemia 04/30/2021   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[place]) 04/30/2021   Episode of transient neurologic symptoms 04/28/2021   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[place]) 10/25/2020   History of gestational diabetes 10/25/2020   Iron deficiency anemia 12/03/2019   Tubular adenoma of colon 10/24/2018   Essential hypertension 12/26/2017   Prediabetes 12/26/2017   Intramural leiomyoma of uterus 07/29/2015    ONSET DATE: 05/18/21   REFERRING DIAG: D49.6 (ICD-10-CM) - Neoplasm of unspecified behavior of brain   THERAPY DIAG:  Cognitive communication deficit  Rationale for Evaluation and Treatment Rehabilitation  SUBJECTIVE:   SUBJECTIVE STATEMENT: "We had some friends come since I saw you, so I didn't see you." Pt accompanied by: significant other, E (husband)  PERTINENT HISTORY: Patient presented to medical attention in March 2023 with new onset seizures, characterized by shaking of the left hand without impaired awareness.  CNS imaging demonstrated non enhancing mass within right frontal lobe, c/w primary neoplasm. She underwent craniotomy, resection on 05/18/21 with Dr. Tommi Rumps at [facility]; pathology was High Grade Glioma IDHmt. Since surgery, husband has noticed more blunted (depressed?) mood, less responsive and engaging. Otherwise she is fully independent and functional. Referred to [facility] from [facility] for local implementation of radiation and chemotherapy. Last day of IMRT and Temodar yesterday (07-31-21). Pt had HHST upon d/c from [facility].  PAIN:  Are you having pain? No  PATIENT GOALS  None stated today when SLP asked, due to decr'd intellectual awareness.   OBJECTIVE:  TODAY'S TREATMENT:  08/29/21: CLQTwas completed today  The Cognitive Linguistic Quick Test (CLQT) was administered to assess the relative status of five cognitive domains: attention, memory, language, executive functioning, and visuospatial skills. Scores from 10 tasks were used to estimate severity ratings (standardized for age groups 18-69 years and 70-89 years) for each domain, a clock drawing task, as well as an overall composite severity rating of cognition.      Task Score Criterion Cut Scores  Personal Facts 8/8 8  Symbol Cancellation 12/12 11  Confrontation Naming 10/10 10  Clock Drawing  10/13 12  Story Retelling 8/10 6  Symbol Trails 7/10 9  Generative Naming 4/10 5  Design Memory 6/6 5  Mazes  4/8 7  Design Generation 8/13 6    Cognitive Domain Composite Score Severity Rating  Attention 181/215 WNL (low)  Memory 168/185 WNL  Executive Function 23/40 Mild  Language 30/37 WNL (low)  Visuospatial Skills 82/105 WNL (low)  Clock Drawing  10/13 Mild  Composite Severity Rating 3.8 WNL   Overall composite rating is "WNL" however on CLQT pt demonstrated decr'd skills in awareness, attention, executive function, and a lack of impulse control. Impulsivity decr'd pt's score specifically with clock drawing, mazes, and symbol trails.   (Date  of eval): SLP discussed evaluation results thus far, and Constant Therapy was introduced and explained.     PATIENT REPORTED OUTCOME MEASURES (PROM): Cognitive Function: to be completed next session. Due to decr'd awareness one will be provided to E and one to [place] and compared.     PATIENT EDUCATION: Education details: See "today's treatment" Person educated: Patient Education method: Explanation Education comprehension: verbalized understanding and needs further education   GOALS: Goals reviewed with patient? Yes  SHORT TERM GOALS: Target date: 08/29/2021   Pt will demo [facility] selective attention with functional tasks in mod noisy environment for 20 minutes in 2 sessions  Baseline: Goal status: Ongoing  2.  Pt will demo WFL alternating attention between billpay task and additional random tasks in 2 sessions  Baseline:  Goal status: Ongoing  3.  Pt will acknowledge at least two areas of deficit during three therapy sessions  Baseline:  Goal status: Ongoing  4.  [name] will note 100% of errors in therapy tasks when given chance to self correct in 3 sessions Baseline:  Goal status: Ongoing  5.  [name] will reportedly demonstrate less frequent "absent-mindedness" in the home (doors open, lights on, etc) than when compared to prior to OP ST, as reported by pt or E  Baseline:  Goal status: Ongoing  LONG TERM GOALS: Target date: 10/24/2021    [name] will score better on E's and her own Cognitive PROM than during her first therapy session Baseline:  Goal status: Ongoing  2.  Pt will demo functional alternating attention with mod complex tasks in therapy session x3 and will reportedly demo functional alternating attention at home as reported by pt or by E, x3  Baseline:  Goal status: Ongoing  3.  [name] will indicate anticipatory awareness during the session or at home as reported by pt or E, x5 Baseline:  Goal status: Ongoing  4.  Pt will exhibit WFL organization and planning by paying  bills independently Baseline:  Goal status: Ongoing   ASSESSMENT:  CLINICAL IMPRESSION: Patient is a 46 y.o. female who was seen today for completion of CLQT. SEE THERAPY NOTE. 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 Additionally she and E have a 40 year old son who will begin kindergarten in the fall and she requires improved cognition for her home life as well.   OBJECTIVE IMPAIRMENTS include attention, memory, awareness, and executive functioning. These impairments are limiting patient from return to work, managing medications, managing appointments, managing finances, household responsibilities, and effectively communicating at home and in community. Factors affecting potential to achieve goals and functional outcome are ability to learn/carryover information and co-morbidities.. Patient will benefit from skilled SLP services to address above impairments and improve overall function.  REHAB POTENTIAL: Good  PLAN: SLP FREQUENCY: 2x/week  SLP DURATION: 12 weeks  PLANNED INTERVENTIONS: Language facilitation, Environmental controls, Cueing hierachy, Cognitive reorganization, Internal/external aids, Functional tasks, SLP instruction and feedback, and Compensatory strategies    Dedra Matsuo, CCC-SLP 08/29/2021, 4:39 PM

## 2021-08-31 ENCOUNTER — Encounter: Payer: Self-pay | Admitting: Rheumatology

## 2021-08-31 ENCOUNTER — Ambulatory Visit (INDEPENDENT_AMBULATORY_CARE_PROVIDER_SITE_OTHER): Payer: 59 | Admitting: Rheumatology

## 2021-08-31 VITALS — BP 122/84 | HR 70 | Ht 59.0 in | Wt 151.4 lb

## 2021-08-31 DIAGNOSIS — R5383 Other fatigue: Secondary | ICD-10-CM

## 2021-08-31 DIAGNOSIS — D126 Benign neoplasm of colon, unspecified: Secondary | ICD-10-CM

## 2021-08-31 DIAGNOSIS — R7303 Prediabetes: Secondary | ICD-10-CM

## 2021-08-31 DIAGNOSIS — M25561 Pain in right knee: Secondary | ICD-10-CM | POA: Diagnosis not present

## 2021-08-31 DIAGNOSIS — M3501 Sicca syndrome with keratoconjunctivitis: Secondary | ICD-10-CM

## 2021-08-31 DIAGNOSIS — C719 Malignant neoplasm of brain, unspecified: Secondary | ICD-10-CM

## 2021-08-31 DIAGNOSIS — E559 Vitamin D deficiency, unspecified: Secondary | ICD-10-CM

## 2021-08-31 DIAGNOSIS — R202 Paresthesia of skin: Secondary | ICD-10-CM | POA: Diagnosis not present

## 2021-08-31 DIAGNOSIS — G8929 Other chronic pain: Secondary | ICD-10-CM

## 2021-08-31 DIAGNOSIS — I10 Essential (primary) hypertension: Secondary | ICD-10-CM

## 2021-08-31 DIAGNOSIS — M25562 Pain in left knee: Secondary | ICD-10-CM

## 2021-09-01 LAB — URINALYSIS, ROUTINE W REFLEX MICROSCOPIC
Bacteria, UA: NONE SEEN /HPF
Bilirubin Urine: NEGATIVE
Glucose, UA: NEGATIVE
Hyaline Cast: NONE SEEN /LPF
Ketones, ur: NEGATIVE
Leukocytes,Ua: NEGATIVE
Nitrite: NEGATIVE
Protein, ur: NEGATIVE
RBC / HPF: NONE SEEN /HPF (ref 0–2)
Specific Gravity, Urine: 1.004 (ref 1.001–1.035)
WBC, UA: NONE SEEN /HPF (ref 0–5)
pH: 6.5 (ref 5.0–8.0)

## 2021-09-01 LAB — MICROSCOPIC MESSAGE

## 2021-09-01 NOTE — Progress Notes (Signed)
UA is unremarkable.

## 2021-09-05 ENCOUNTER — Other Ambulatory Visit: Payer: Self-pay | Admitting: Internal Medicine

## 2021-09-05 DIAGNOSIS — C719 Malignant neoplasm of brain, unspecified: Secondary | ICD-10-CM

## 2021-09-05 NOTE — Telephone Encounter (Signed)
Oral chemotheraphy will be reordered when the patient is seen for labs and md visit as already planned and scheduled.

## 2021-09-06 ENCOUNTER — Ambulatory Visit: Payer: 59 | Attending: Neurological Surgery

## 2021-09-06 DIAGNOSIS — R41841 Cognitive communication deficit: Secondary | ICD-10-CM | POA: Insufficient documentation

## 2021-09-06 NOTE — Therapy (Signed)
OUTPATIENT SPEECH LANGUAGE PATHOLOGY TREATMENT SESSION   Patient Name: Joanne Jackson MRN: [id number] DOB:Jan 11, 1976, 46 y.o., female Today's Date: 09/06/2021  PCP: Leamon Arnt., MD REFERRING PROVIDER: Kathyrn Drown, MD    End of Session - 09/06/21 1725     Visit Number 3    Number of Visits 25    Date for SLP Re-Evaluation 10/30/21    Authorization - Number of Visits 31    SLP Start Time 1620    SLP Stop Time  1700    SLP Time Calculation (min) 40 min    Activity Tolerance Patient tolerated treatment well               Past Medical History:  Diagnosis Date   Glioma ([place]) 05/18/2021   Hypertension    Pre-diabetes    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[place])    Past Surgical History:  Procedure Laterality Date   BRAIN SURGERY  05/2021   tumor removal   CESAREAN SECTION     DILATION AND CURETTAGE, DIAGNOSTIC / THERAPEUTIC     Patient Active Problem List   Diagnosis Date Noted   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   Dyslipidemia 04/30/2021   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[place]) 04/30/2021   Episode of transient neurologic symptoms 04/28/2021   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[place]) 10/25/2020   History of gestational diabetes 10/25/2020   Iron deficiency anemia 12/03/2019   Tubular adenoma of colon 10/24/2018   Essential hypertension 12/26/2017   Prediabetes 12/26/2017   Intramural leiomyoma of uterus 07/29/2015    ONSET DATE: 05/18/21   REFERRING DIAG: D49.6 (ICD-10-CM) - Neoplasm of unspecified behavior of brain   THERAPY DIAG:  Cognitive communication deficit  Rationale for Evaluation and Treatment Rehabilitation  SUBJECTIVE:   SUBJECTIVE STATEMENT: "I watch TV and feel (like I need a break)." Pt accompanied by: self  PERTINENT HISTORY: Patient presented to medical attention in March 2023 with new onset seizures, characterized by shaking of the left hand without impaired awareness.  CNS imaging demonstrated non enhancing mass within right frontal lobe, c/w primary neoplasm. She underwent craniotomy, resection on 05/18/21 with Dr. Tommi Rumps at [facility]; pathology was High Grade Glioma IDHmt. Since surgery, husband has noticed more blunted (depressed?) mood, less responsive and engaging. Otherwise she is fully independent and functional. Referred to [facility] from [facility] for local implementation of radiation and chemotherapy. Last day of IMRT and Temodar yesterday (07-31-21). Pt had HHST upon d/c from [facility].  PAIN:  Are you having pain? No  PATIENT GOALS  None stated today when SLP asked, due to decr'd intellectual awareness.   OBJECTIVE:  TODAY'S TREATMENT:  09/06/21: Pt showed SLP her written homework. On her corrected copy she still had errors. SLP assisted pt in organizing her responses and saw approx 9 minutes into task that pt stopped completely. Pt endorsed mental fatigue so SLP asked pt to take a brain break for 3 minutes. Pt returned to task with better sustained/selective attention for remaining 6 minutes of task. SLP then educated and instructed pt on the importance of and examples of brain breaks when completing focused tasks. SLP asked pt what situations she has found herself that her brain needs rest from focusing and she said TV shows. SLP suggested stopping/pausing TV and doing mini-meditation session for 3-5 minutes and returning to task after brain break. SLP shared details of this session with pt's husband (with pt's permission).  08/29/21: CLQTwas completed today The Cognitive Linguistic Quick Test (CLQT) was administered  to assess the relative status of five cognitive domains: attention, memory, language, executive functioning, and visuospatial skills. Scores from 10 tasks were used to estimate severity ratings (standardized for age groups 18-69 years and 70-89 years) for each domain, a clock drawing task, as well as an overall composite severity rating of cognition.      Task  Score Criterion Cut Scores  Personal Facts 8/8 8  Symbol Cancellation 12/12 11  Confrontation Naming 10/10 10  Clock Drawing  10/13 12  Story Retelling 8/10 6  Symbol Trails 7/10 9  Generative Naming 4/10 5  Design Memory 6/6 5  Mazes  4/8 7  Design Generation 8/13 6    Cognitive Domain Composite Score Severity Rating  Attention 181/215 WNL (low)  Memory 168/185 WNL  Executive Function 23/40 Mild  Language 30/37 WNL (low)  Visuospatial Skills 82/105 WNL (low)  Clock Drawing  10/13 Mild  Composite Severity Rating 3.8 WNL   Overall composite rating is "WNL" however on CLQT pt demonstrated decr'd skills in awareness, attention, executive function, and a lack of impulse control. Impulsivity decr'd pt's score specifically with clock drawing, mazes, and symbol trails.   (Date of eval): SLP discussed evaluation results thus far, and Constant Therapy was introduced and explained.     PATIENT REPORTED OUTCOME MEASURES (PROM): Cognitive Function: to be completed next session. Due to decr'd awareness one will be provided to E and one to [place] and compared.     PATIENT EDUCATION: Education details: See "today's treatment" Person educated: Patient Education method: Explanation Education comprehension: verbalized understanding and needs further education   GOALS: Goals reviewed with patient? Yes  SHORT TERM GOALS:  Target date: 09/27/2021 (extended due to only 2 therapy sessions since eval)  Pt will demo [facility] selective attention with functional tasks in mod noisy environment for 20 minutes in 2 sessions  Baseline: Goal status: Ongoing  2.  Pt will demo WFL alternating attention between billpay task and additional random tasks in 2 sessions  Baseline:  Goal status: Ongoing  3.  Pt will acknowledge at least two areas of deficit during three therapy sessions  Baseline:  Goal status: Ongoing  4.  [name] will note 100% of errors in therapy tasks when given chance to self correct in 3  sessions Baseline:  Goal status: Ongoing  5.  [name] will reportedly demonstrate less frequent "absent-mindedness" in the home (doors open, lights on, etc) than when compared to prior to OP ST, as reported by pt or E  Baseline:  Goal status: Ongoing  LONG TERM GOALS: Target date: 10/24/2021    [name] will score better on E's and her own Cognitive PROM than during her first therapy session Baseline:  Goal status: Ongoing  2.  Pt will demo functional alternating attention with mod complex tasks in therapy session x3 and will reportedly demo functional alternating attention at home as reported by pt or by E, x3  Baseline:  Goal status: Ongoing  3.  [name] will indicate anticipatory awareness during the session or at home as reported by pt or E, x5 Baseline:  Goal status: Ongoing  4.  Pt will exhibit WFL organization and planning by paying bills independently Baseline:  Goal status: Ongoing   ASSESSMENT:  CLINICAL IMPRESSION: Patient is a 46 y.o. female who was seen today for cognitive communication tx. SEE THERAPY NOTE. Today SLP and pt discussed use of/need for brain breaks. 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 Additionally she and E have a 47 year old son who will begin kindergarten in the fall and she requires improved cognition for her home life as well. STG due date was extended due to only 2 therapy sessions since evaluation.   OBJECTIVE IMPAIRMENTS include attention, memory, awareness, and executive functioning. These impairments are limiting patient from return to work, managing medications, managing appointments, managing finances, household responsibilities, and effectively communicating at home and in community. Factors affecting potential to achieve goals and functional outcome are ability to learn/carryover information and co-morbidities.. Patient will benefit from skilled SLP  services to address above impairments and improve overall function.  REHAB POTENTIAL: Good  PLAN: SLP FREQUENCY: 2x/week  SLP DURATION: 12 weeks  PLANNED INTERVENTIONS: Language facilitation, Environmental controls, Cueing hierachy, Cognitive reorganization, Internal/external aids, Functional tasks, SLP instruction and feedback, and Compensatory strategies    Rotunda Worden, CCC-SLP 09/06/2021, 5:26 PM

## 2021-09-11 ENCOUNTER — Ambulatory Visit
Admission: RE | Admit: 2021-09-11 | Discharge: 2021-09-11 | Disposition: A | Discharge status: Home / Self Care | Payer: 59 | Source: Ambulatory Visit | Attending: Radiation Oncology | Admitting: Radiation Oncology

## 2021-09-11 DIAGNOSIS — C719 Malignant neoplasm of brain, unspecified: Secondary | ICD-10-CM

## 2021-09-12 ENCOUNTER — Ambulatory Visit: Payer: 59

## 2021-09-12 DIAGNOSIS — R41841 Cognitive communication deficit: Secondary | ICD-10-CM | POA: Diagnosis not present

## 2021-09-12 NOTE — Therapy (Signed)
OUTPATIENT SPEECH LANGUAGE PATHOLOGY TREATMENT SESSION   Patient Name: Joanne Jackson MRN: [id number] DOB:01-29-1976, 46 y.o., female Today's Date: 09/12/2021  PCP: Leamon Arnt., MD REFERRING PROVIDER: Kathyrn Drown, MD    End of Session - 09/12/21 1449     Visit Number 4    Number of Visits 25    Date for SLP Re-Evaluation 10/30/21    Authorization - Number of Visits 61    SLP Start Time 1448    SLP Stop Time  1530    SLP Time Calculation (min) 42 min    Activity Tolerance Patient tolerated treatment well               Past Medical History:  Diagnosis Date   Glioma ([place]) 05/18/2021   Hypertension    Pre-diabetes    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[place])    Past Surgical History:  Procedure Laterality Date   BRAIN SURGERY  05/2021   tumor removal   CESAREAN SECTION     DILATION AND CURETTAGE, DIAGNOSTIC / THERAPEUTIC     Patient Active Problem List   Diagnosis Date Noted   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   Dyslipidemia 04/30/2021   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[place]) 04/30/2021   Episode of transient neurologic symptoms 04/28/2021   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[place]) 10/25/2020   History of gestational diabetes 10/25/2020   Iron deficiency anemia 12/03/2019   Tubular adenoma of colon 10/24/2018   Essential hypertension 12/26/2017   Prediabetes 12/26/2017   Intramural leiomyoma of uterus 07/29/2015    ONSET DATE: 05/18/21   REFERRING DIAG: D49.6 (ICD-10-CM) - Neoplasm of unspecified behavior of brain   THERAPY DIAG:  Cognitive communication deficit  Rationale for Evaluation and Treatment Rehabilitation  SUBJECTIVE:   SUBJECTIVE STATEMENT: "She still takes time before she respond to me." Pt accompanied by: self  PERTINENT HISTORY: Patient presented to medical attention in March 2023 with new onset seizures, characterized by shaking of the left hand without impaired  awareness. CNS imaging demonstrated non enhancing mass within right frontal lobe, c/w primary neoplasm. She underwent craniotomy, resection on 05/18/21 with Dr. 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 Since surgery, husband has noticed more blunted (depressed?) mood, less responsive and engaging. Otherwise she is fully independent and functional. Referred to [facility] from [facility] for local implementation of radiation and chemotherapy. Last day of IMRT and Temodar yesterday (07-31-21). Pt had HHST upon d/c from [facility].  PAIN:  Are you having pain? No  PATIENT GOALS  None stated today when SLP asked, due to decr'd intellectual awareness.   OBJECTIVE:  TODAY'S TREATMENT:  09/12/21: Joanne Jackson expressed pt having difficulty with processing spoken information. SLP believes something to do with pt's attention level as well as with possible decr in auditory/overall processing. Pt brought her homework - evidence pt completed without double-checking her answers, and did not slow down and look at the task prior to completing; Errors were due to incomplete procedures (pt did not look fully at the information provided and wrote down the first item she saw). Pt has not been using "brain breaks" when her daughter is present because her daughter follows her wherever she goes. SLP provided some suggestions in "pt instructions" for pt and husband to try at home. SLP provided homework for auditory attention/processing for husband to complete with pt.  09/06/21: Pt showed SLP her written homework. On her corrected copy she still had errors. SLP assisted pt in organizing her responses and  saw approx 9 minutes into task that pt stopped completely. Pt endorsed mental fatigue so SLP asked pt to take a brain break for 3 minutes. Pt returned to task with better sustained/selective attention for remaining 6 minutes of task. SLP then educated and instructed pt on the importance of and examples of brain breaks when completing focused  tasks. SLP asked pt what situations she has found herself that her brain needs rest from focusing and she said TV shows. SLP suggested stopping/pausing TV and doing mini-meditation session for 3-5 minutes and returning to task after brain break. SLP shared details of this session with pt's husband (with pt's permission).  08/29/21: CLQTwas completed today The Cognitive Linguistic Quick Test (CLQT) was administered to assess the relative status of five cognitive domains: attention, memory, language, executive functioning, and visuospatial skills. Scores from 10 tasks were used to estimate severity ratings (standardized for age groups 18-69 years and 70-89 years) for each domain, a clock drawing task, as well as an overall composite severity rating of cognition.      Task Score Criterion Cut Scores  Personal Facts 8/8 8  Symbol Cancellation 12/12 11  Confrontation Naming 10/10 10  Clock Drawing  10/13 12  Story Retelling 8/10 6  Symbol Trails 7/10 9  Generative Naming 4/10 5  Design Memory 6/6 5  Mazes  4/8 7  Design Generation 8/13 6    Cognitive Domain Composite Score Severity Rating  Attention 181/215 WNL (low)  Memory 168/185 WNL  Executive Function 23/40 Mild  Language 30/37 WNL (low)  Visuospatial Skills 82/105 WNL (low)  Clock Drawing  10/13 Mild  Composite Severity Rating 3.8 WNL   Overall composite rating is "WNL" however on CLQT pt demonstrated decr'd skills in awareness, attention, executive function, and a lack of impulse control. Impulsivity decr'd pt's score specifically with clock drawing, mazes, and symbol trails.   (Date of eval): SLP discussed evaluation results thus far, and Constant Therapy was introduced and explained.     PATIENT REPORTED OUTCOME MEASURES (PROM): Cognitive Function: to be completed next session. Due to decr'd awareness one will be provided to Joanne Jackson and one to Joanne Jackson and compared.     PATIENT EDUCATION: Education details: See "today's  treatment" Person educated: Patient Education method: Explanation Education comprehension: verbalized understanding and needs further education   GOALS: Goals reviewed with patient? Yes  SHORT TERM GOALS:  Target date: 09/27/2021 (extended due to only 2 therapy sessions since eval)  Pt will demo [facility] selective attention with functional tasks in mod noisy environment for 20 minutes in 2 sessions  Baseline: Goal status: Ongoing  2.  Pt will demo WFL alternating attention between billpay task and additional random tasks in 2 sessions  Baseline:  Goal status: Ongoing  3.  Pt will acknowledge at least two areas of deficit during three therapy sessions  Baseline:  Goal status: Ongoing  4.  Joanne Jackson will note 100% of errors in therapy tasks when given chance to self correct in 3 sessions Baseline:  Goal status: Ongoing  5.  Joanne Jackson will reportedly demonstrate less frequent "absent-mindedness" in the home (doors open, lights on, etc) than when compared to prior to OP ST, as reported by pt or Joanne Jackson  Baseline:  Goal status: Ongoing  LONG TERM GOALS: Target date: 10/24/2021    Joanne Jackson will score better on Joanne Jackson's and her own Cognitive PROM than during her first therapy session Baseline:  Goal status: Ongoing  2.  Pt will demo functional alternating attention with  mod complex tasks in therapy session x3 and will reportedly demo functional alternating attention at home as reported by pt or by Joanne Jackson, x3  Baseline:  Goal status: Ongoing  3.  Joanne Jackson will indicate anticipatory awareness during the session or at home as reported by pt or Joanne Jackson, x5 Baseline:  Goal status: Ongoing  4.  Pt will exhibit WFL organization and planning by paying bills independently Baseline:  Goal status: Ongoing   ASSESSMENT:  CLINICAL IMPRESSION: Patient is a 46 y.o. female who was seen today for cognitive communication tx. SEE THERAPY NOTE. She and Joanne Jackson have a 26 year old son who will begin kindergarten in the fall and she requires improved  cognition for her home life as well.   OBJECTIVE IMPAIRMENTS include attention, memory, awareness, and executive functioning. These impairments are limiting patient from return to work, managing medications, managing appointments, managing finances, household responsibilities, and effectively communicating at home and in community. Factors affecting potential to achieve goals and functional outcome are ability to learn/carryover information and co-morbidities.. Patient will benefit from skilled SLP services to address above impairments and improve overall function.  REHAB POTENTIAL: Good  PLAN: SLP FREQUENCY: 2x/week  SLP DURATION: 12 weeks  PLANNED INTERVENTIONS: Language facilitation, Environmental controls, Cueing hierachy, Cognitive reorganization, Internal/external aids, Functional tasks, SLP instruction and feedback, and Compensatory strategies    Izeyah Deike, CCC-SLP 09/12/2021, 2:51 PM

## 2021-09-12 NOTE — Patient Instructions (Signed)
   If your daughter follows you when you need a "brain break" - here are some suggestions:  Explain that you need to rest your brain. Then:  - If your husband is available, tell your daughter she needs to spend some time with dad for X minutes  - If she can stay absolutely silent she may come with you into the room   You and Ee can talk about this further, if these suggestions won't work for you. You DO really need some time for your brain to rest, without your daughter by your side.

## 2021-09-18 NOTE — Progress Notes (Signed)
  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 She developed fatigue and one episode of emesis during therapy.    Impression/Plan: 1.  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  I was unable to reach the patient by phone, but we would be happy to continue to follow her as needed, but she will also continue to follow up with Dr. Mickeal Skinner in medical oncology.        Carola Rhine, PAC

## 2021-09-19 ENCOUNTER — Ambulatory Visit: Payer: 59

## 2021-09-19 DIAGNOSIS — R41841 Cognitive communication deficit: Secondary | ICD-10-CM

## 2021-09-19 NOTE — Therapy (Signed)
OUTPATIENT SPEECH LANGUAGE PATHOLOGY TREATMENT SESSION   Patient Name: Joanne Jackson MRN: [id number] DOB:March 29, 1975, 46 y.o., female Today's Date: 09/19/2021  PCP: Leamon Arnt., MD REFERRING PROVIDER: Kathyrn Drown, MD    End of Session - 09/19/21 1736     Visit Number 5    Number of Visits 25    Date for SLP Re-Evaluation 10/30/21    Authorization - Number of Visits 60    SLP Start Time 1618    SLP Stop Time  1700    SLP Time Calculation (min) 42 min    Activity Tolerance Patient tolerated treatment well                Past Medical History:  Diagnosis Date   Glioma ([place]) 05/18/2021   Hypertension    Pre-diabetes    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[place])    Past Surgical History:  Procedure Laterality Date   BRAIN SURGERY  05/2021   tumor removal   CESAREAN SECTION     DILATION AND CURETTAGE, DIAGNOSTIC / THERAPEUTIC     Patient Active Problem List   Diagnosis Date Noted   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   Dyslipidemia 04/30/2021   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[place]) 04/30/2021   Episode of transient neurologic symptoms 04/28/2021   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[place]) 10/25/2020   History of gestational diabetes 10/25/2020   Iron deficiency anemia 12/03/2019   Tubular adenoma of colon 10/24/2018   Essential hypertension 12/26/2017   Prediabetes 12/26/2017   Intramural leiomyoma of uterus 07/29/2015    ONSET DATE: 05/18/21   REFERRING DIAG: D49.6 (ICD-10-CM) - Neoplasm of unspecified behavior of brain   THERAPY DIAG:  Cognitive communication deficit  Rationale for Evaluation and Treatment Rehabilitation  SUBJECTIVE:   SUBJECTIVE STATEMENT: "I did not sleep last night [place] Docker." "How's your daughter?" Pt accompanied by: self  PERTINENT HISTORY: Patient presented to medical attention in March 2023 with new onset seizures, characterized by shaking of the left hand without  impaired awareness. CNS imaging demonstrated non enhancing mass within right frontal lobe, c/w primary neoplasm. She underwent craniotomy, resection on 05/18/21 with Dr. Tommi Rumps at 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 Since surgery, husband has noticed more blunted (depressed?) mood, less responsive and engaging. Otherwise she is fully independent and functional. Referred to [facility] from [facility] for local implementation of radiation and chemotherapy. Last day of IMRT and Temodar yesterday (07-31-21). Pt had HHST upon d/c from [facility].  PAIN:  Are you having pain? No  PATIENT GOALS  None stated today when SLP asked, due to decr'd intellectual awareness.   OBJECTIVE:  TODAY'S TREATMENT:  09/19/21: Pt entered with homework completed. [name] told SLP she was able to make brain breaks work with her daughter accompanying her with a timer and muted iPad. In detailed tasks pt today did not incorporate a strategy for written language comprehension so she made errors (three). She fixed two, but one took SLP verbal cue for written organization. Homework for written detailed task.  09/12/21: E expressed pt having difficulty with processing spoken information. SLP believes something to do with pt's attention level as well as with possible decr in auditory/overall processing. Pt brought her homework - evidence pt completed without double-checking her answers, and did not slow down and look at the task prior to completing; Errors were due to incomplete procedures (pt did not look fully at the information provided and wrote down the first item she saw). Pt has not been  using "brain breaks" when her daughter is present because her daughter follows her wherever she goes. SLP provided some suggestions in "pt instructions" for pt and husband to try at home. SLP provided homework for auditory attention/processing for husband to complete with pt.  09/06/21: Pt showed SLP her written homework. On her corrected copy she still had  errors. SLP assisted pt in organizing her responses and saw approx 9 minutes into task that pt stopped completely. Pt endorsed mental fatigue so SLP asked pt to take a brain break for 3 minutes. Pt returned to task with better sustained/selective attention for remaining 6 minutes of task. SLP then educated and instructed pt on the importance of and examples of brain breaks when completing focused tasks. SLP asked pt what situations she has found herself that her brain needs rest from focusing and she said TV shows. SLP suggested stopping/pausing TV and doing mini-meditation session for 3-5 minutes and returning to task after brain break. SLP shared details of this session with pt's husband (with pt's permission).  08/29/21: CLQTwas completed today The Cognitive Linguistic Quick Test (CLQT) was administered to assess the relative status of five cognitive domains: attention, memory, language, executive functioning, and visuospatial skills. Scores from 10 tasks were used to estimate severity ratings (standardized for age groups 18-69 years and 70-89 years) for each domain, a clock drawing task, as well as an overall composite severity rating of cognition.      Task Score Criterion Cut Scores  Personal Facts 8/8 8  Symbol Cancellation 12/12 11  Confrontation Naming 10/10 10  Clock Drawing  10/13 12  Story Retelling 8/10 6  Symbol Trails 7/10 9  Generative Naming 4/10 5  Design Memory 6/6 5  Mazes  4/8 7  Design Generation 8/13 6    Cognitive Domain Composite Score Severity Rating  Attention 181/215 WNL (low)  Memory 168/185 WNL  Executive Function 23/40 Mild  Language 30/37 WNL (low)  Visuospatial Skills 82/105 WNL (low)  Clock Drawing  10/13 Mild  Composite Severity Rating 3.8 WNL   Overall composite rating is "WNL" however on CLQT pt demonstrated decr'd skills in awareness, attention, executive function, and a lack of impulse control. Impulsivity decr'd pt's score specifically with clock  drawing, mazes, and symbol trails.   (Date of eval): SLP discussed evaluation results thus far, and Constant Therapy was introduced and explained.     PATIENT REPORTED OUTCOME MEASURES (PROM): Cognitive Function: to be completed next session. Due to decr'd awareness one will be provided to E and one to Olga and compared.     PATIENT EDUCATION: Education details: See "today's treatment" Person educated: Patient Education method: Explanation Education comprehension: verbalized understanding and needs further education   GOALS: Goals reviewed with patient? Yes  SHORT TERM GOALS:  Target date: 09/27/2021 (extended due to only 2 therapy sessions since eval)  Pt will demo [facility] selective attention with functional tasks in mod noisy environment for 20 minutes in 2 sessions  Baseline: Goal status: Ongoing  2.  Pt will demo WFL alternating attention between billpay task and additional random tasks in 2 sessions  Baseline:  Goal status: Ongoing  3.  Pt will acknowledge at least two areas of deficit during three therapy sessions  Baseline: 09/19/21 Goal status: Ongoing  4.  [name] will note 100% of errors in therapy tasks when given chance to self correct in 3 sessions Baseline: 09/19/21 Goal status: Ongoing  5.  [name] will reportedly demonstrate less frequent "absent-mindedness" in the home (  doors open, lights on, etc) than when compared to prior to OP ST, as reported by pt or E  Baseline:  Goal status: Ongoing  LONG TERM GOALS: Target date: 10/24/2021    [name] will score better on E's and her own Cognitive PROM than during her first therapy session Baseline:  Goal status: Ongoing  2.  Pt will demo functional alternating attention with mod complex tasks in therapy session x3 and will reportedly demo functional alternating attention at home as reported by pt or by E, x3  Baseline:  Goal status: Ongoing  3.  [name] will indicate anticipatory awareness during the session or at home as reported  by pt or E, x5 Baseline:  Goal status: Ongoing  4.  Pt will exhibit WFL organization and planning by paying bills independently Baseline:  Goal status: Ongoing   ASSESSMENT:  CLINICAL IMPRESSION: Patient is a 46 y.o. female who was seen today for cognitive communication tx. SEE THERAPY NOTE. She and E have a 6 year old son who will begin kindergarten the end of this month and she requires improved cognition for her home life as well.   OBJECTIVE IMPAIRMENTS include attention, memory, awareness, and executive functioning. These impairments are limiting patient from return to work, managing medications, managing appointments, managing finances, household responsibilities, and effectively communicating at home and in community. Factors affecting potential to achieve goals and functional outcome are ability to learn/carryover information and co-morbidities.. Patient will benefit from skilled SLP services to address above impairments and improve overall function.  REHAB POTENTIAL: Good  PLAN: SLP FREQUENCY: 2x/week  SLP DURATION: 12 weeks  PLANNED INTERVENTIONS: Language facilitation, Environmental controls, Cueing hierachy, Cognitive reorganization, Internal/external aids, Functional tasks, SLP instruction and feedback, and Compensatory strategies    Laken Lobato, CCC-SLP 09/19/2021, 5:36 PM

## 2021-09-24 ENCOUNTER — Encounter: Payer: Self-pay | Admitting: Family Medicine

## 2021-09-25 ENCOUNTER — Inpatient Hospital Stay (HOSPITAL_BASED_OUTPATIENT_CLINIC_OR_DEPARTMENT_OTHER): Payer: 59 | Admitting: Internal Medicine

## 2021-09-25 ENCOUNTER — Telehealth: Payer: Self-pay | Admitting: Family Medicine

## 2021-09-25 ENCOUNTER — Inpatient Hospital Stay: Payer: 59 | Attending: Radiation Oncology

## 2021-09-25 VITALS — BP 121/78 | HR 66 | Temp 98.5°F | Resp 16 | Ht 59.0 in | Wt 150.7 lb

## 2021-09-25 DIAGNOSIS — Z8349 Family history of other endocrine, nutritional and metabolic diseases: Secondary | ICD-10-CM | POA: Insufficient documentation

## 2021-09-25 DIAGNOSIS — M3500 Sicca syndrome, unspecified: Secondary | ICD-10-CM | POA: Insufficient documentation

## 2021-09-25 DIAGNOSIS — C711 Malignant neoplasm of frontal lobe: Secondary | ICD-10-CM | POA: Insufficient documentation

## 2021-09-25 DIAGNOSIS — C719 Malignant neoplasm of brain, unspecified: Secondary | ICD-10-CM

## 2021-09-25 DIAGNOSIS — R112 Nausea with vomiting, unspecified: Secondary | ICD-10-CM | POA: Diagnosis not present

## 2021-09-25 DIAGNOSIS — Z79630 Long term (current) use of alkylating agent: Secondary | ICD-10-CM | POA: Diagnosis not present

## 2021-09-25 DIAGNOSIS — Z79899 Other long term (current) drug therapy: Secondary | ICD-10-CM | POA: Insufficient documentation

## 2021-09-25 DIAGNOSIS — Z8249 Family history of ischemic heart disease and other diseases of the circulatory system: Secondary | ICD-10-CM | POA: Diagnosis not present

## 2021-09-25 DIAGNOSIS — I10 Essential (primary) hypertension: Secondary | ICD-10-CM | POA: Diagnosis not present

## 2021-09-25 LAB — CBC WITH DIFFERENTIAL (CANCER CENTER ONLY)
Abs Immature Granulocytes: 0.01 10*3/uL (ref 0.00–0.07)
Basophils Absolute: 0 10*3/uL (ref 0.0–0.1)
Basophils Relative: 0 %
Eosinophils Absolute: 0 10*3/uL (ref 0.0–0.5)
Eosinophils Relative: 1 %
HCT: 39 % (ref 36.0–46.0)
Hemoglobin: 14 g/dL (ref 12.0–15.0)
Immature Granulocytes: 0 %
Lymphocytes Relative: 13 %
Lymphs Abs: 0.5 10*3/uL — ABNORMAL LOW (ref 0.7–4.0)
MCH: 30.9 pg (ref 26.0–34.0)
MCHC: 35.9 g/dL (ref 30.0–36.0)
MCV: 86.1 fL (ref 80.0–100.0)
Monocytes Absolute: 0.3 10*3/uL (ref 0.1–1.0)
Monocytes Relative: 9 %
Neutro Abs: 2.6 10*3/uL (ref 1.7–7.7)
Neutrophils Relative %: 77 %
Platelet Count: 178 10*3/uL (ref 150–400)
RBC: 4.53 MIL/uL (ref 3.87–5.11)
RDW: 12.5 % (ref 11.5–15.5)
WBC Count: 3.4 10*3/uL — ABNORMAL LOW (ref 4.0–10.5)
nRBC: 0 % (ref 0.0–0.2)

## 2021-09-25 LAB — CMP (CANCER CENTER ONLY)
ALT: 15 U/L (ref 0–44)
AST: 15 U/L (ref 15–41)
Albumin: 4.5 g/dL (ref 3.5–5.0)
Alkaline Phosphatase: 43 U/L (ref 38–126)
Anion gap: 4 — ABNORMAL LOW (ref 5–15)
BUN: 10 mg/dL (ref 6–20)
CO2: 28 mmol/L (ref 22–32)
Calcium: 9.8 mg/dL (ref 8.9–10.3)
Chloride: 105 mmol/L (ref 98–111)
Creatinine: 0.62 mg/dL (ref 0.44–1.00)
GFR, Estimated: >60 mL/min (ref >60)
Glucose, Bld: 98 mg/dL (ref 70–99)
Potassium: 3.5 mmol/L (ref 3.5–5.1)
Sodium: 137 mmol/L (ref 135–145)
Total Bilirubin: 0.9 mg/dL (ref 0.3–1.2)
Total Protein: 7 g/dL (ref 6.5–8.1)

## 2021-09-25 MED ORDER — TEMOZOLOMIDE 20 MG PO CAPS: 20.0000 mg | ORAL_CAPSULE | Freq: Every day | ORAL | 0 refills | Status: DC

## 2021-09-25 MED ORDER — TEMOZOLOMIDE 140 MG PO CAPS: 140.0000 mg | ORAL_CAPSULE | Freq: Every day | ORAL | 0 refills | Status: DC

## 2021-09-25 MED ORDER — TEMOZOLOMIDE 180 MG PO CAPS: 180.0000 mg | ORAL_CAPSULE | Freq: Every day | ORAL | 0 refills | Status: DC

## 2021-09-25 MED ORDER — LISINOPRIL 10 MG PO TABS: 10.0000 mg | ORAL_TABLET | Freq: Every day | ORAL | 3 refills | Status: DC

## 2021-09-25 NOTE — Progress Notes (Signed)
[facility] [address]  [place], [place] [address] [phone]   Interval Evaluation  Date of Service: 09/25/21 Patient Name: Elaina Cara Patient MRN: [id number] Patient DOB: Jan 24, 1976 Provider: Henreitta Leber, MD  Identifying Statement:  Maelys Kinnick is a 46 y.o. 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 Zachery Conch at [facility]; path demonstrates High Grade Glioma IDH-40mt.   06/19/2021 - 06/19/2021 Chemotherapy   Patient is on Treatment Plan : BRAIN GLIOBLASTOMA Radiation Therapy With Concurrent Temozolomide 75 mg/m2 Daily Followed By Sequential Maintenance Temozolomide x 6-12 cycles     08/28/2021 -  Chemotherapy   Patient is on Treatment Plan : BRAIN GLIOBLASTOMA Consolidation Temozolomide Days 1-5 q28 Days        Biomarkers:  MGMT Unknown.  IDH 1/2 Mutated.  EGFR Unknown  TERT Unmutated   Interval History: Chika Cichowski presents today for follow up, now having completed cycle #1 adjuvant Temodar.  No issues tolerating the chemo.  No other new or progressive complaints.  She has not been using her CPAP mask due to discomfort, sleep has been very poor again in recent weeks.  No seizures or headaches.    H+P (06/12/21) Patient presented to medical attention in March 2023 with new onset seizures, characterized by shaking of the left hand without impaired awareness.  CNS imaging demonstrated non enhancing mass within right frontal lobe, c/w primary neoplasm.  She underwent craniotomy, resection on 05/18/21 with Dr. Zachery Conch at [facility]; pathology was High Grade Glioma IDHmt.  Since surgery, husband has noticed more blunted (depressed?) mood, less responsive and engaging.  Otherwise she is fully independent and functional.  Referred to [facility] for local implementation of  radiation and chemotherapy.  Medications: Current Outpatient Medications on File Prior to Visit  Medication Sig Dispense Refill   Cholecalciferol (VITAMIN D3) 50 MCG (2000 UT) TABS Take 2,000 Units by mouth daily as needed.      ferrous sulfate 324 (65 Fe) MG TBEC TAKE 1 TABLET BY MOUTH EVERY DAY (Patient taking differently: Take 324 mg by mouth daily.) 90 tablet 1   levETIRAcetam (KEPPRA) 500 MG tablet Take 1 tablet (500 mg total) by mouth 2 (two) times daily. 60 tablet 2   RESTASIS 0.05 % ophthalmic emulsion Place 1 drop into both eyes 2 (two) times daily.     ondansetron (ZOFRAN) 8 MG tablet Take 1 tablet (8 mg total) by mouth 2 (two) times daily as needed (nausea and vomiting). May take 30-60 minutes prior to Temodar administration if nausea/vomiting occurs. (Patient not taking: Reported on 09/25/2021) 30 tablet 1   No current facility-administered medications on file prior to visit.    Allergies: No Known Allergies Past Medical History:  Past Medical History:  Diagnosis Date   Glioma (HCC) 05/18/2021   Hypertension    Pre-diabetes    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HCC)    Past Surgical History:  Past Surgical History:  Procedure Laterality Date   BRAIN SURGERY  05/2021   tumor removal   CESAREAN SECTION     DILATION AND CURETTAGE, DIAGNOSTIC / THERAPEUTIC     Social History:  Social History   Socioeconomic History   Marital status: Married    Spouse name: Not on file   Number of children: 1   Years of  education: Not on file   Highest education level: Not on file  Occupational History   Occupation: Accounting  Tobacco Use   Smoking status: Never    Passive exposure: Never   Smokeless tobacco: Never  Vaping Use   Vaping Use: Never used  Substance and Sexual Activity   Alcohol use: Not Currently   Drug use: Never   Sexual activity: Yes    Partners: Male    Birth control/protection: Other-see comments, Condom    Comment: husband is infertile   Other Topics Concern   Not on file  Social History Narrative   Not on file   Social Determinants of Health   Financial Resource Strain: Not on file  Food Insecurity: Not on file  Transportation Needs: Not on file  Physical Activity: Not on file  Stress: Not on file  Social Connections: Not on file  Intimate Partner Violence: Not on file   Family History:  Family History  Problem Relation Age of Onset   Thyroid disease Mother    Heart attack Father 70   Hypertension Father    Healthy Brother    Healthy Daughter    Healthy Paternal Grandfather    Colon cancer Neg Hx    Cancer Neg Hx    Stroke Neg Hx     Review of Systems: Constitutional: Doesn't report fevers, chills or abnormal weight loss Eyes: Doesn't report blurriness of vision Ears, nose, mouth, throat, and face: Doesn't report sore throat Respiratory: Doesn't report cough, dyspnea or wheezes Cardiovascular: Doesn't report palpitation, chest discomfort  Gastrointestinal:  Doesn't report nausea, constipation, diarrhea GU: Doesn't report incontinence Skin: Doesn't report skin rashes Neurological: Per HPI Musculoskeletal: Doesn't report joint pain Behavioral/Psych: Doesn't report anxiety  Physical Exam: Vitals:   09/25/21 1201  BP: 121/78  Pulse: 66  Resp: 16  Temp: 98.5 F (36.9 C)  SpO2: 97%    KPS: 90. General: Alert, cooperative, pleasant, in no acute distress Head: Normal EENT: No conjunctival injection or scleral icterus.  Lungs: Resp effort normal Cardiac: Regular rate Abdomen: Non-distended abdomen Skin: No rashes cyanosis or petechiae. Extremities: No clubbing or edema  Neurologic Exam: Mental Status: Awake, alert, attentive to examiner. Oriented to self and environment. Language is fluent with intact comprehension. Abulic features noted. Cranial Nerves: Visual acuity is grossly normal. Visual fields are full. Extra-ocular movements intact. No ptosis. Face is symmetric Motor: Tone and bulk  are normal. Power is full in both arms and legs. Reflexes are symmetric, no pathologic reflexes present.  Sensory: Intact to light touch Gait: Normal.   Labs: I have reviewed the data as listed    Component Value Date/Time   NA 137 09/25/2021 1139   K 3.5 09/25/2021 1139   CL 105 09/25/2021 1139   CO2 28 09/25/2021 1139   GLUCOSE 98 09/25/2021 1139   BUN 10 09/25/2021 1139   CREATININE 0.62 09/25/2021 1139   CREATININE 0.50 11/24/2019 1449   CALCIUM 9.8 09/25/2021 1139   PROT 7.0 09/25/2021 1139   ALBUMIN 4.5 09/25/2021 1139   AST 15 09/25/2021 1139   ALT 15 09/25/2021 1139   ALKPHOS 43 09/25/2021 1139   BILITOT 0.9 09/25/2021 1139   GFRNONAA >60 09/25/2021 1139   GFRNONAA 119 11/24/2019 1449   GFRAA 138 11/24/2019 1449   Lab Results  Component Value Date   WBC 3.4 (L) 09/25/2021   NEUTROABS 2.6 09/25/2021   HGB 14.0 09/25/2021   HCT 39.0 09/25/2021   MCV 86.1 09/25/2021   PLT 178  09/25/2021    Imaging:    Assessment/Plan 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[place]) - Plan: temozolomide (TEMODAR) 180 MG capsule, temozolomide (TEMODAR) 140 MG capsule, temozolomide (TEMODAR) 20 MG capsule  [place] is clinically stable today, now having cycle #1 adjuvant Temodar.  We recommended initiating treatment with cycle #2 Temozolomide dose increased to 200 mg/m2, on for five days and off for twenty three days in twenty eight day cycles. The patient will have a complete blood count performed on days 21 and 28 of each cycle, and a comprehensive metabolic panel performed on day 28 of each cycle. Labs may need to be performed more often. Zofran will prescribed for home use for nausea/vomiting.   Informed consent was obtained verbally at bedside to proceed with oral chemotherapy.  Chemotherapy should be held for the following:  ANC less than 1,000  Platelets less than 100,000  LFT or creatinine greater than 2x ULN  If clinical concerns/contraindications  develop  May con't Trazodone $RemoveBeforeD'50mg'BjOWcYnuhuBwhv$  HS, but patient has not been compliant with CPAP.  For OSA and insomnia, we recommended evaluation with sleep medicine upcoming on 10/10/21.  Keppra will con't $RemoveBe'500mg'rDdvWiqUV$  BID.  We appreciate the opportunity to participate in the care of Evangeline Frimpong.   We ask that Teryl Gubler return to clinic in 1 months prior to cycle #3 with MRI brain and labs for evaluation, or sooner as needed.  All questions were answered. The patient knows to call the clinic with any problems, questions or concerns. No barriers to learning were detected.  The total time spent in the encounter was 30 minutes and more than 50% was on counseling and review of test results   Ventura Sellers, MD Medical Director of Neuro-Oncology [facility] [place] 09/25/21 12:57 PM

## 2021-09-25 NOTE — Telephone Encounter (Signed)
See pt's mychart message and my reply. Start lisinoprol 10 and follow

## 2021-09-25 NOTE — Telephone Encounter (Signed)
Patient states: - Upon waking up this morning, her BP was 140/100 - Declines any symptoms - She was informed by PCP that if her BP rises to contact her directly   Patient requests: -PCP send in the lisinopril 20 mg daily as previously discussed.   I offered patient to speak with triage nurse but she declined.

## 2021-09-26 ENCOUNTER — Ambulatory Visit: Payer: 59

## 2021-09-26 DIAGNOSIS — R41841 Cognitive communication deficit: Secondary | ICD-10-CM | POA: Diagnosis not present

## 2021-09-26 NOTE — Therapy (Signed)
OUTPATIENT SPEECH LANGUAGE PATHOLOGY TREATMENT SESSION   Patient Name: Joanne Jackson MRN: [id number] DOB:Jul 22, 1975, 46 y.o., female Today's Date: 09/26/2021  PCP: Leamon Arnt., MD REFERRING PROVIDER: Kathyrn Drown, MD    End of Session - 09/26/21 1710     Visit Number 6    Number of Visits 25    Date for SLP Re-Evaluation 10/30/21    Authorization - Number of Visits 13    SLP Start Time 1617    SLP Stop Time  1658    SLP Time Calculation (min) 41 min    Activity Tolerance Patient tolerated treatment well                 Past Medical History:  Diagnosis Date   Glioma ([place]) 05/18/2021   Hypertension    Pre-diabetes    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[place])    Past Surgical History:  Procedure Laterality Date   BRAIN SURGERY  05/2021   tumor removal   CESAREAN SECTION     DILATION AND CURETTAGE, DIAGNOSTIC / THERAPEUTIC     Patient Active Problem List   Diagnosis Date Noted   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   Dyslipidemia 04/30/2021   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[place]) 04/30/2021   Episode of transient neurologic symptoms 04/28/2021   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[place]) 10/25/2020   History of gestational diabetes 10/25/2020   Iron deficiency anemia 12/03/2019   Tubular adenoma of colon 10/24/2018   Essential hypertension 12/26/2017   Prediabetes 12/26/2017   Intramural leiomyoma of uterus 07/29/2015    ONSET DATE: 05/18/21   REFERRING DIAG: D49.6 (ICD-10-CM) - Neoplasm of unspecified behavior of brain   THERAPY DIAG:  Cognitive communication deficit  Rationale for Evaluation and Treatment Rehabilitation  SUBJECTIVE:   SUBJECTIVE STATEMENT: "I did not sleep last night Glendell Docker." "How's your daughter?" Pt accompanied by: self  PERTINENT HISTORY: Patient presented to medical attention in March 2023 with new onset seizures, characterized by shaking of the left hand without  impaired awareness. CNS imaging demonstrated non enhancing mass within right frontal lobe, c/w primary neoplasm. She underwent craniotomy, resection on 05/18/21 with Dr. Tommi Rumps at [facility]; pathology was High Grade Glioma IDHmt. Since surgery, husband has noticed more blunted (depressed?) mood, less responsive and engaging. Otherwise she is fully independent and functional. Referred to [facility] for local implementation of radiation and chemotherapy. Last day of IMRT and Temodar yesterday (07-31-21). Pt had HHST upon d/c from [facility].  PAIN:  Are you having pain? No  PATIENT GOALS  None stated today when SLP asked, due to decr'd intellectual awareness.   OBJECTIVE:  TODAY'S TREATMENT:  09/26/21: Pt reports she will begin Temodar again tonight. Has possible sleep study tomorrow night. Homework completed in full, 100% success. Pt reported using sticky notes successfully for recall of turning lights off and bringing her glasses downstairs. Today SLP used practical written math problems to target attending to details. Pt req'd consistent min A to note errors; Did not check work immediately (problem after problem), and pt had 3/6 incorrect after her double checking. Pt noted to move very quickly with math and make errors due to decr'd alternating attention writing down all figures needed for accuracy. SLP encouraged pt to follow with her finger to incr accuracy, and to cross off figures when she has written them down to aid in alternating attention.  09/19/21: Pt entered with homework completed. [name] told SLP she was able to make brain breaks work with  her daughter accompanying her with a timer and muted iPad. In detailed tasks pt today did not incorporate a strategy for written language comprehension so she made errors (three). She fixed two, but one took SLP verbal cue for written organization. Homework for written detailed task.  09/12/21: E expressed pt having difficulty with processing spoken information. SLP  believes something to do with pt's attention level as well as with possible decr in auditory/overall processing. Pt brought her homework - evidence pt completed without double-checking her answers, and did not slow down and look at the task prior to completing; Errors were due to incomplete procedures (pt did not look fully at the information provided and wrote down the first item she saw). Pt has not been using "brain breaks" when her daughter is present because her daughter follows her wherever she goes. SLP provided some suggestions in "pt instructions" for pt and husband to try at home. SLP provided homework for auditory attention/processing for husband to complete with pt.  09/06/21: Pt showed SLP her written homework. On her corrected copy she still had errors. SLP assisted pt in organizing her responses and saw approx 9 minutes into task that pt stopped completely. Pt endorsed mental fatigue so SLP asked pt to take a brain break for 3 minutes. Pt returned to task with better sustained/selective attention for remaining 6 minutes of task. SLP then educated and instructed pt on the importance of and examples of brain breaks when completing focused tasks. SLP asked pt what situations she has found herself that her brain needs rest from focusing and she said TV shows. SLP suggested stopping/pausing TV and doing mini-meditation session for 3-5 minutes and returning to task after brain break. SLP shared details of this session with pt's husband (with pt's permission).  08/29/21: CLQTwas completed today The Cognitive Linguistic Quick Test (CLQT) was administered to assess the relative status of five cognitive domains: attention, memory, language, executive functioning, and visuospatial skills. Scores from 10 tasks were used to estimate severity ratings (standardized for age groups 18-69 years and 70-89 years) for each domain, a clock drawing task, as well as an overall composite severity rating of cognition.       Task Score Criterion Cut Scores  Personal Facts 8/8 8  Symbol Cancellation 12/12 11  Confrontation Naming 10/10 10  Clock Drawing  10/13 12  Story Retelling 8/10 6  Symbol Trails 7/10 9  Generative Naming 4/10 5  Design Memory 6/6 5  Mazes  4/8 7  Design Generation 8/13 6    Cognitive Domain Composite Score Severity Rating  Attention 181/215 WNL (low)  Memory 168/185 WNL  Executive Function 23/40 Mild  Language 30/37 WNL (low)  Visuospatial Skills 82/105 WNL (low)  Clock Drawing  10/13 Mild  Composite Severity Rating 3.8 WNL   Overall composite rating is "WNL" however on CLQT pt demonstrated decr'd skills in awareness, attention, executive function, and a lack of impulse control. Impulsivity decr'd pt's score specifically with clock drawing, mazes, and symbol trails.   (Date of eval): SLP discussed evaluation results thus far, and Constant Therapy was introduced and explained.     PATIENT REPORTED OUTCOME MEASURES (PROM): Cognitive Function: to be completed next session. Due to decr'd awareness one will be provided to E and one to [place] and compared.     PATIENT EDUCATION: Education details: See "today's treatment" Person educated: Patient Education method: Explanation Education comprehension: verbalized understanding and needs further education   GOALS: Goals reviewed with patient? Yes  SHORT TERM GOALS:  Target date: 10/11/2021 (extended due to only 6 therapy sessions since eval)  Pt will demo [facility] selective attention with functional tasks in mod noisy environment for 20 minutes in 2 sessions  Baseline: 09/26/21 Goal status: ongoing  2.  Pt will demo WFL alternating attention between billpay task and additional random tasks in 2 sessions  Baseline:  Goal status: ongoing  3.  Pt will acknowledge at least two areas of deficit during three therapy sessions  Baseline: 09/19/21, 09-26-21 Goal status: Ongoing  4.  [name] will note 80% of errors in therapy tasks when given  chance to self correct in 3 sessions Baseline: 09/19/21 Goal status: revised  5.  [name] will reportedly demonstrate less frequent "absent-mindedness" in the home (doors open, lights on, etc) than when compared to prior to OP ST, as reported by pt or E  Baseline:  Goal status: Met  LONG TERM GOALS: Target date: 10/24/2021    [name] will score better on E's and her own Cognitive PROM than during her first therapy session Baseline:  Goal status: Ongoing  2.  Pt will demo functional alternating attention with mod complex tasks in therapy session x3 and will reportedly demo functional alternating attention at home as reported by pt or by E, x3  Baseline:  Goal status: Ongoing  3.  [name] will indicate anticipatory awareness during the session or at home as reported by pt or E, x5 Baseline:  Goal status: Ongoing  4.  Pt will exhibit WFL organization and planning by paying bills independently Baseline:  Goal status: Ongoing   ASSESSMENT:  CLINICAL IMPRESSION: Patient is a 46 y.o. female who was seen today for cognitive communication tx. SEE THERAPY NOTE. Pt's "absent mindedness" is improved with use of sticky notes, however pt cont to exhibit reduced emergent awareness, attention skills, and attention to detail. She and E have a 45 year old child who will begin kindergarten very soon and she requires improved cognition for her home life as well.   OBJECTIVE IMPAIRMENTS include attention, memory, awareness, and executive functioning. These impairments are limiting patient from return to work, managing medications, managing appointments, managing finances, household responsibilities, and effectively communicating at home and in community. Factors affecting potential to achieve goals and functional outcome are ability to learn/carryover information and co-morbidities.. Patient will benefit from skilled SLP services to address above impairments and improve overall function.  REHAB POTENTIAL:  Good  PLAN: SLP FREQUENCY: 2x/week  SLP DURATION: 12 weeks  PLANNED INTERVENTIONS: Language facilitation, Environmental controls, Cueing hierachy, Cognitive reorganization, Internal/external aids, Functional tasks, SLP instruction and feedback, and Compensatory strategies    Kerie Badger, CCC-SLP 09/26/2021, 5:11 PM

## 2021-09-28 ENCOUNTER — Ambulatory Visit: Payer: 59

## 2021-09-28 ENCOUNTER — Other Ambulatory Visit: Payer: Self-pay | Admitting: Radiation Therapy

## 2021-10-03 ENCOUNTER — Encounter: Payer: Self-pay | Admitting: Internal Medicine

## 2021-10-04 ENCOUNTER — Other Ambulatory Visit: Payer: Self-pay

## 2021-10-04 ENCOUNTER — Encounter (HOSPITAL_COMMUNITY): Payer: Self-pay | Admitting: Emergency Medicine

## 2021-10-04 ENCOUNTER — Emergency Department (HOSPITAL_COMMUNITY)
Admission: EM | Admit: 2021-10-04 | Discharge: 2021-10-04 | Disposition: A | Discharge status: Home / Self Care | Payer: 59 | Attending: Emergency Medicine | Admitting: Emergency Medicine

## 2021-10-04 DIAGNOSIS — K5900 Constipation, unspecified: Secondary | ICD-10-CM | POA: Insufficient documentation

## 2021-10-04 LAB — CBC WITH DIFFERENTIAL/PLATELET
Abs Immature Granulocytes: 0 10*3/uL (ref 0.00–0.07)
Basophils Absolute: 0 10*3/uL (ref 0.0–0.1)
Basophils Relative: 0 %
Eosinophils Absolute: 0 10*3/uL (ref 0.0–0.5)
Eosinophils Relative: 0 %
HCT: 38.3 % (ref 36.0–46.0)
Hemoglobin: 12.9 g/dL (ref 12.0–15.0)
Immature Granulocytes: 0 %
Lymphocytes Relative: 12 %
Lymphs Abs: 0.3 10*3/uL — ABNORMAL LOW (ref 0.7–4.0)
MCH: 30 pg (ref 26.0–34.0)
MCHC: 33.7 g/dL (ref 30.0–36.0)
MCV: 89.1 fL (ref 80.0–100.0)
Monocytes Absolute: 0.2 10*3/uL (ref 0.1–1.0)
Monocytes Relative: 8 %
Neutro Abs: 2.2 10*3/uL (ref 1.7–7.7)
Neutrophils Relative %: 80 %
Platelets: 167 10*3/uL (ref 150–400)
RBC: 4.3 MIL/uL (ref 3.87–5.11)
RDW: 12.1 % (ref 11.5–15.5)
WBC: 2.7 10*3/uL — ABNORMAL LOW (ref 4.0–10.5)
nRBC: 0 % (ref 0.0–0.2)

## 2021-10-04 MED ORDER — FLEET ENEMA 7-19 GM/118ML RE ENEM: 1.0000 | ENEMA | Freq: Once | RECTAL | Status: DC

## 2021-10-04 MED ORDER — METOCLOPRAMIDE HCL 10 MG PO TABS
10.0000 mg | ORAL_TABLET | ORAL | Status: AC
  Administered 2021-10-04: 10 mg via ORAL
  Filled 2021-10-04: qty 1

## 2021-10-04 MED ORDER — GLYCERIN (LAXATIVE) 2 G RE SUPP
1.0000 | RECTAL | Status: AC
  Administered 2021-10-04: 1 via RECTAL
  Filled 2021-10-04: qty 1

## 2021-10-04 MED ORDER — METOCLOPRAMIDE HCL 10 MG PO TABS: 10.0000 mg | ORAL_TABLET | Freq: Three times a day (TID) | ORAL | 0 refills | Status: DC | PRN

## 2021-10-04 NOTE — Discharge Instructions (Addendum)
Today you were seen in the emergency department for your constipation.    In the emergency department you were given Reglan and a suppository after your labs did not show that you were neutropenic.    At home, please take the Reglan we have prescribed you for any nausea that you may have.  Do not take this medication while taking Zofran.    Follow-up with your primary doctor in 2-3 days regarding your visit.    Return immediately to the emergency department if you experience any of the following: Fevers, abdominal pain, vomiting, or any other concerning symptoms.    Thank you for visiting our Emergency Department. It was a pleasure taking care of you today.

## 2021-10-04 NOTE — ED Provider Notes (Signed)
[facility] DEPT Provider Note   CSN: [id number] Arrival date & time: 10/04/21  1112     History Chief Complaint  Patient presents with   Constipation    Joanne Jackson is a 46 y.o. female.  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  Patient states that she completed her last round of chemotherapy from Tuesday to Saturday.  Says that she did not have a bowel movement since then and has had mild abdominal discomfort.  Has tried home senna as well as MiraLAX without improvement of her symptoms so she came to the emergency department today for evaluation.  Does not believe that she was neutropenic in the past.  No history of abdominal surgeries.  Nausea and vomiting on the weekend but no nausea and vomiting since she has been constipated.   Constipation  Pt reports constipation x 3 days. Pt reports she has tried miralax and senna w/ no relief. Pt denies n/v.     Home Medications Prior to Admission medications   Medication Sig Start Date End Date Taking? Authorizing Provider  metoCLOPramide (REGLAN) 10 MG tablet Take 1 tablet (10 mg total) by mouth every 8 (eight) hours as needed for up to 12 doses for nausea. 10/04/21  Yes Fransico Meadow, MD  Cholecalciferol (VITAMIN D3) 50 MCG (2000 UT) TABS Take 2,000 Units by mouth daily as needed.     [provider]  ferrous sulfate 324 (65 Fe) MG TBEC TAKE 1 TABLET BY MOUTH EVERY DAY Patient taking differently: Take 324 mg by mouth daily. 09/06/20   Leamon Arnt, MD  levETIRAcetam (KEPPRA) 500 MG tablet Take 1 tablet (500 mg total) by mouth 2 (two) times daily. 08/17/21   Ventura Sellers, MD  lisinopril (ZESTRIL) 10 MG tablet Take 1 tablet (10 mg total) by mouth daily. 09/25/21   Leamon Arnt, MD  RESTASIS 0.05 % ophthalmic emulsion Place 1 drop into both eyes 2 (two) times daily. 01/25/20    [provider]  temozolomide (TEMODAR) 140 MG capsule Take 1 capsule (140 mg total) by mouth daily. May take on an empty stomach to decrease nausea & vomiting. 09/25/21   Ventura Sellers, MD  temozolomide (TEMODAR) 180 MG capsule Take 1 capsule (180 mg total) by mouth daily. May take on an empty stomach to decrease nausea & vomiting. 09/25/21   Ventura Sellers, MD  temozolomide (TEMODAR) 20 MG capsule Take 1 capsule (20 mg total) by mouth daily. May take on an empty stomach to decrease nausea & vomiting. 09/25/21   Ventura Sellers, MD      Allergies    Patient has no known allergies.    Review of Systems   Review of Systems  Gastrointestinal:  Positive for constipation.    Physical Exam Updated Vital Signs BP 103/77   Pulse 66   Temp 98.7 F (37.1 C) (Oral)   Resp 19   SpO2 96%  Physical Exam Vitals and nursing note reviewed.  Constitutional:      General: She is not in acute distress.    Appearance: She is well-developed.  HENT:     Head: Normocephalic and atraumatic.     Right Ear: External ear normal.     Left Ear: External ear normal.     Nose: Nose normal.  Eyes:     Extraocular Movements: Extraocular movements intact.     Conjunctiva/sclera: Conjunctivae normal.  Pupils: Pupils are equal, round, and reactive to light.  Pulmonary:     Effort: No respiratory distress.  Abdominal:     General: Abdomen is flat. There is no distension.     Palpations: Abdomen is soft. There is no mass.     Tenderness: There is no abdominal tenderness. There is no guarding.  Musculoskeletal:        General: No swelling.     Cervical back: Normal range of motion and neck supple.     Right lower leg: No edema.     Left lower leg: No edema.  Skin:    General: Skin is warm and dry.     Capillary Refill: Capillary refill takes less than 2 seconds.  Neurological:     Mental Status: She is alert and oriented to person, place, and time. Mental status is at baseline.   Psychiatric:        Mood and Affect: Mood normal.     ED Results / Procedures / Treatments   Labs (all labs ordered are listed, but only abnormal results are displayed) Labs Reviewed  CBC WITH DIFFERENTIAL/PLATELET - Abnormal; Notable for the following components:      Result Value   WBC 2.7 (*)    Lymphs Abs 0.3 (*)    All other components within normal limits    EKG None  Radiology No results found.  Procedures Procedures   Medications Ordered in ED Medications  metoCLOPramide (REGLAN) tablet 10 mg (10 mg Oral Given 10/04/21 1319)  Glycerin (Adult) 2 g suppository 1 suppository (1 suppository Rectal Given 10/04/21 1654)    ED Course/ Medical Decision Making/ A&P                           Medical Decision Making Amount and/or Complexity of Data Reviewed Labs: ordered.  Risk OTC drugs. Prescription drug management.   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    Initial Ddx:  Constipation, ileus, obstruction  MDM:  Feel the patient likely has constipation from unclear etiology.  No nausea and vomiting or significant tenderness on exam that would suggest an ileus or obstruction at this time so do not feel imaging is warranted though I did consider this.  We will check labs to ensure the patient is not neutropenic at this time before pursuing additional treatment.  Plan:  CBC with differential Reglan  ED Summary:  Patient underwent the above work-up which did not show that she was neutropenic.  As such felt that the patient was safe for a suppository.  Did not pursue an enema because she is still immunocompromised and did not want bacterial translocation.  Patient was instructed to start taking Reglan instead of Zofran which will help with GI motility and constipation.  Patient was then discharged with instructions to follow-up with her primary doctor in  several days.  Return precautions discussed prior to discharge.  Dispo: DC Home. Return precautions discussed including, but not limited to, those listed in the AVS. Allowed pt time to ask questions which were answered fully prior to dc.   Additional history obtained from significant other Records reviewed OP Notes  Final Clinical Impression(s) / ED Diagnoses Final diagnoses:  Constipation, unspecified constipation type    Rx / DC Orders ED Discharge Orders          Ordered    metoCLOPramide (REGLAN) 10 MG tablet  Every 8 hours PRN        10/04/21 1703              Fransico Meadow, MD 10/04/21 2038

## 2021-10-04 NOTE — ED Triage Notes (Signed)
Pt reports constipation x 3 days. Pt reports she has tried miralax and senna w/ no relief. Pt denies n/v.

## 2021-10-05 ENCOUNTER — Ambulatory Visit: Payer: 59

## 2021-10-05 DIAGNOSIS — R41841 Cognitive communication deficit: Secondary | ICD-10-CM | POA: Diagnosis not present

## 2021-10-05 NOTE — Therapy (Addendum)
OUTPATIENT SPEECH LANGUAGE PATHOLOGY TREATMENT SESSION   Patient Name: Joanne Jackson MRN: [id number] DOB:10-21-1975, 46 y.o., female Today's Date: 10/05/2021  PCP: Leamon Arnt., MD REFERRING PROVIDER: Kathyrn Drown, MD    End of Session - 10/05/21 1049     Visit Number 7    Number of Visits 25    Date for SLP Re-Evaluation 10/30/21    Authorization - Number of Visits 20    SLP Start Time 1020    SLP Stop Time  1100    SLP Time Calculation (min) 40 min    Activity Tolerance Patient tolerated treatment well                  Past Medical History:  Diagnosis Date   Glioma ([place]) 05/18/2021   Hypertension    Pre-diabetes    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[place])    Past Surgical History:  Procedure Laterality Date   BRAIN SURGERY  05/2021   tumor removal   CESAREAN SECTION     DILATION AND CURETTAGE, DIAGNOSTIC / THERAPEUTIC     Patient Active Problem List   Diagnosis Date Noted   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   Dyslipidemia 04/30/2021   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[place]) 04/30/2021   Episode of transient neurologic symptoms 04/28/2021   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[place]) 10/25/2020   History of gestational diabetes 10/25/2020   Iron deficiency anemia 12/03/2019   Tubular adenoma of colon 10/24/2018   Essential hypertension 12/26/2017   Prediabetes 12/26/2017   Intramural leiomyoma of uterus 07/29/2015    ONSET DATE: 05/18/21   REFERRING DIAG: D49.6 (ICD-10-CM) - Neoplasm of unspecified behavior of brain   THERAPY DIAG:  Cognitive communication deficit  Rationale for Evaluation and Treatment Rehabilitation  SUBJECTIVE:   SUBJECTIVE STATEMENT: "I had to go to the ED last night because of constipation." Pt accompanied by: self  PERTINENT HISTORY: Patient presented to medical attention in March 2023 with new onset seizures, characterized by shaking of the left hand without  impaired awareness. CNS imaging demonstrated non enhancing mass within right frontal lobe, c/w primary neoplasm. She underwent craniotomy, resection on 05/18/21 with Dr. Tommi Rumps at [facility]; pathology was High Grade Glioma IDHmt. Since surgery, husband has noticed more blunted (depressed?) mood, less responsive and engaging. Otherwise she is fully independent and functional. Referred to [facility] from [facility] for local implementation of radiation and chemotherapy. Last day of IMRT and Temodar yesterday (07-31-21). Pt had HHST upon d/c from [facility].  PAIN:  Are you having pain? No  PATIENT GOALS  None stated today when SLP asked, due to decr'd intellectual awareness.   OBJECTIVE:  TODAY'S TREATMENT:  10/05/21: Pt brought in written homework with many erase marks. "These were hard," pt stated. SLP gave pt "code" homework task which req'd her to alternate attention between response and stimuli. Pt stated she, eventually, used a straight edge as a guide and this was less difficult than no guide. Today she alternated attention WFL/WNL between detailed written tasks with cues for attention to detail. When she double-checked her answers she found all but one of her mistakes. SLP had door open for mod noise and pt req'd cue for "brain break" but she did not take one until SLP cued a second time; "I was still working," pt stated. She took 3 minute break and went back to tasks. SLP provided pt and husband PROM for cognition and asked pt return these next session. Joanne Jackson, at end of session today  indicated she needs to use compensations for deficits in memory, for attention, and for mental fatigue.  09/26/21: Pt reports she will begin Temodar again tonight. Has possible sleep study tomorrow night. Homework completed in full, 100% success. Pt reported using sticky notes successfully for recall of turning lights off and bringing her glasses downstairs. Today SLP used practical written math problems to target attending to details. Pt req'd  consistent min A to note errors; Did not check work immediately (problem after problem), and pt had 3/6 incorrect after her double checking. Pt noted to move very quickly with math and make errors due to decr'd alternating attention writing down all figures needed for accuracy. SLP encouraged pt to follow with her finger to incr accuracy, and to cross off figures when she has written them down to aid in alternating attention.  09/19/21: Pt entered with homework completed. Joanne Jackson told SLP she was able to make brain breaks work with her daughter accompanying her with a timer and muted iPad. In detailed tasks pt today did not incorporate a strategy for written language comprehension so she made errors (three). She fixed two, but one took SLP verbal cue for written organization. Homework for written detailed task.  09/12/21: E expressed pt having difficulty with processing spoken information. SLP believes something to do with pt's attention level as well as with possible decr in auditory/overall processing. Pt brought her homework - evidence pt completed without double-checking her answers, and did not slow down and look at the task prior to completing; Errors were due to incomplete procedures (pt did not look fully at the information provided and wrote down the first item she saw). Pt has not been using "brain breaks" when her daughter is present because her daughter follows her wherever she goes. SLP provided some suggestions in "pt instructions" for pt and husband to try at home. SLP provided homework for auditory attention/processing for husband to complete with pt.  09/06/21: Pt showed SLP her written homework. On her corrected copy she still had errors. SLP assisted pt in organizing her responses and saw approx 9 minutes into task that pt stopped completely. Pt endorsed mental fatigue so SLP asked pt to take a brain break for 3 minutes. Pt returned to task with better sustained/selective attention for remaining 6  minutes of task. SLP then educated and instructed pt on the importance of and examples of brain breaks when completing focused tasks. SLP asked pt what situations she has found herself that her brain needs rest from focusing and she said TV shows. SLP suggested stopping/pausing TV and doing mini-meditation session for 3-5 minutes and returning to task after brain break. SLP shared details of this session with pt's husband (with pt's permission).  08/29/21: CLQTwas completed today The Cognitive Linguistic Quick Test (CLQT) was administered to assess the relative status of five cognitive domains: attention, memory, language, executive functioning, and visuospatial skills. Scores from 10 tasks were used to estimate severity ratings (standardized for age groups 18-69 years and 70-89 years) for each domain, a clock drawing task, as well as an overall composite severity rating of cognition.      Task Score Criterion Cut Scores  Personal Facts 8/8 8  Symbol Cancellation 12/12 11  Confrontation Naming 10/10 10  Clock Drawing  10/13 12  Story Retelling 8/10 6  Symbol Trails 7/10 9  Generative Naming 4/10 5  Design Memory 6/6 5  Mazes  4/8 7  Design Generation 8/13 6    Cognitive Domain Composite  Score Severity Rating  Attention 181/215 WNL (low)  Memory 168/185 WNL  Executive Function 23/40 Mild  Language 30/37 WNL (low)  Visuospatial Skills 82/105 WNL (low)  Clock Drawing  10/13 Mild  Composite Severity Rating 3.8 WNL   Overall composite rating is "WNL" however on CLQT pt demonstrated decr'd skills in awareness, attention, executive function, and a lack of impulse control. Impulsivity decr'd pt's score specifically with clock drawing, mazes, and symbol trails.   (Date of eval): SLP discussed evaluation results thus far, and [facility] was introduced and explained.     PATIENT REPORTED OUTCOME MEASURES (PROM): Cognitive Function: to be completed next session. Due to decr'd awareness one  will be provided to E and one to Joanne Jackson and compared.     PATIENT EDUCATION: Education details: See "today's treatment" Person educated: Patient Education method: Explanation Education comprehension: verbalized understanding and needs further education   GOALS: Goals reviewed with patient? Yes  SHORT TERM GOALS:  Target date: 10/11/2021 (extended due to only 6 therapy sessions since eval)  Pt will demo WFL selective attention with functional tasks in mod noisy environment for 20 minutes in 2 sessions  Baseline: 09/26/21 Goal status: met  2.  Pt will demo WFL alternating attention between billpay task and additional random tasks in 2 sessions  Baseline: 10/05/21 Goal status: ongoing  3.  Pt will acknowledge at least two areas of deficit during three therapy sessions  Baseline: 09/19/21, 09-26-21 Goal status: Met  4.  Joanne Jackson will note 80% of errors in therapy tasks when given chance to self correct in 3 sessions Baseline: 09/19/21, 10-05-21 Goal status: revised  5.  Joanne Jackson will reportedly demonstrate less frequent "absent-mindedness" in the home (doors open, lights on, etc) than when compared to prior to OP ST, as reported by pt or E  Baseline:  Goal status: Met  LONG TERM GOALS: Target date: 10/24/2021    Joanne Jackson will score better on E's and her own Cognitive PROM than during her first therapy session Baseline:  Goal status: Ongoing  2.  Pt will demo functional alternating attention with mod complex tasks in therapy session x3 and will reportedly demo functional alternating attention at home as reported by pt or by E, x3  Baseline:  Goal status: Ongoing  3.  Joanne Jackson will indicate anticipatory awareness during the session or at home as reported by pt or E, x5 Baseline:  Goal status: Ongoing  4.  Pt will exhibit WFL organization and planning by paying bills independently Baseline:  Goal status: Ongoing   ASSESSMENT:  CLINICAL IMPRESSION: Patient is a 45 y.o. female who was seen today for  cognitive communication tx. SEE THERAPY NOTE. Pt's "absent mindedness" is improved with use of sticky notes, however pt cont to exhibit reduced emergent awareness, attention skills, and attention to detail. She and E have a 5 year old child who has begun kindergarten, and Joanne Jackson requires improved cognition for responsibilities at home.   OBJECTIVE IMPAIRMENTS include attention, memory, awareness, and executive functioning. These impairments are limiting patient from return to work, managing medications, managing appointments, managing finances, household responsibilities, and effectively communicating at home and in community. Factors affecting potential to achieve goals and functional outcome are ability to learn/carryover information and co-morbidities.. Patient will benefit from skilled SLP services to address above impairments and improve overall function.  REHAB POTENTIAL: Good  PLAN: SLP FREQUENCY: 2x/week  SLP DURATION: 12 weeks  PLANNED INTERVENTIONS: Language facilitation, Environmental controls, Cueing hierachy, Cognitive reorganization, Internal/external aids, Functional tasks,   SLP instruction and feedback, and Compensatory strategies    SCHINKE,CARL, CCC-SLP 10/05/2021, 11:58 AM      

## 2021-10-10 ENCOUNTER — Other Ambulatory Visit: Payer: Self-pay | Admitting: Internal Medicine

## 2021-10-10 DIAGNOSIS — C719 Malignant neoplasm of brain, unspecified: Secondary | ICD-10-CM

## 2021-10-10 NOTE — Telephone Encounter (Signed)
Patient will have labs and see provider before he renews this prescription.

## 2021-10-11 ENCOUNTER — Ambulatory Visit: Payer: 59

## 2021-10-13 ENCOUNTER — Ambulatory Visit: Payer: 59 | Attending: Neurological Surgery

## 2021-10-13 DIAGNOSIS — R41841 Cognitive communication deficit: Secondary | ICD-10-CM | POA: Diagnosis present

## 2021-10-13 NOTE — Patient Instructions (Signed)
   Constant therapy app  [id number] for 15% off a subscription

## 2021-10-13 NOTE — Therapy (Signed)
OUTPATIENT SPEECH LANGUAGE PATHOLOGY TREATMENT SESSION   Patient Name: Joanne Jackson MRN: [id number] DOB:03/07/1975, 46 y.o., female Today's Date: 10/13/2021  PCP: Leamon Arnt., MD REFERRING PROVIDER: Kathyrn Drown, MD           Past Medical History:  Diagnosis Date   Glioma ([place]) 05/18/2021   Hypertension    Pre-diabetes    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[place])    Past Surgical History:  Procedure Laterality Date   BRAIN SURGERY  05/2021   tumor removal   CESAREAN SECTION     DILATION AND CURETTAGE, DIAGNOSTIC / THERAPEUTIC     Patient Active Problem List   Diagnosis Date Noted   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   Dyslipidemia 04/30/2021   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[place]) 04/30/2021   Episode of transient neurologic symptoms 04/28/2021   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[place]) 10/25/2020   History of gestational diabetes 10/25/2020   Iron deficiency anemia 12/03/2019   Tubular adenoma of colon 10/24/2018   Essential hypertension 12/26/2017   Prediabetes 12/26/2017   Intramural leiomyoma of uterus 07/29/2015    ONSET DATE: 05/18/21   REFERRING DIAG: D49.6 (ICD-10-CM) - Neoplasm of unspecified behavior of brain   THERAPY DIAG:  Cognitive communication deficit  Rationale for Evaluation and Treatment Rehabilitation  SUBJECTIVE:   SUBJECTIVE STATEMENT: Joanne Jackson reports she got 20 minutes sleep last night; Has gotten a little over one hour of sleep this week. Pt accompanied by: self  PERTINENT HISTORY: Patient presented to medical attention in March 2023 with new onset seizures, characterized by shaking of the left hand without impaired awareness. CNS imaging demonstrated non enhancing mass within right frontal lobe, c/w primary neoplasm. She underwent craniotomy, resection on 05/18/21 with Dr. Tommi Rumps at [facility]; pathology was High Grade Glioma IDHmt. Since surgery, husband has noticed more  blunted (depressed?) mood, less responsive and engaging. Otherwise she is fully independent and functional. [facility]erred [facility] for local implementation of radiation and chemotherapy. Last day of IMRT and Temodar yesterday (07-31-21). Pt had HHST upon d/c from [facility].  PAIN:  Are you having pain? No  PATIENT GOALS  None stated today when SLP asked, due to decr'd intellectual awareness.   OBJECTIVE:  TODAY'S TREATMENT:  10/13/21: SLP and pt discussed whether or not 10/05/21: Pt brought in written homework with many erase marks. "These were hard," pt stated. SLP gave pt "code" homework task which req'd her to alternate attention between response and stimuli. Pt stated she, eventually, used a straight edge as a guide and this was less difficult than no guide. Today she alternated attention WFL/WNL between detailed written tasks with cues for attention to detail. When she double-checked her answers she found all but one of her mistakes. SLP had door open for mod noise and pt req'd cue for "brain break" but she did not take one until SLP cued a second time; "I was still working," pt stated. She took 3 minute break and went back to tasks. SLP provided pt and husband PROM for cognition and asked pt return these next session. Joanne Jackson, at end of session today indicated she needs to use compensations for deficits in memory, for attention, and for mental fatigue.  09/26/21: Pt reports she will begin Temodar again tonight. Has possible sleep study tomorrow night. Homework completed in full, 100% success. Pt reported using sticky notes successfully for recall of turning lights off and bringing her glasses downstairs. Today SLP used practical written math problems to target  attending to details. Pt req'd consistent min A to note errors; Did not check work immediately (problem after problem), and pt had 3/6 incorrect after her double checking. Pt noted to move very quickly with math and make errors due to decr'd alternating  attention writing down all figures needed for accuracy. SLP encouraged pt to follow with her finger to incr accuracy, and to cross off figures when she has written them down to aid in alternating attention.  09/19/21: Pt entered with homework completed. Joanne Jackson told SLP she was able to make brain breaks work with her daughter accompanying her with a timer and muted iPad. In detailed tasks pt today did not incorporate a strategy for written language comprehension so she made errors (three). She fixed two, but one took SLP verbal cue for written organization. Homework for written detailed task.  09/12/21: E expressed pt having difficulty with processing spoken information. SLP believes something to do with pt's attention level as well as with possible decr in auditory/overall processing. Pt brought her homework - evidence pt completed without double-checking her answers, and did not slow down and look at the task prior to completing; Errors were due to incomplete procedures (pt did not look fully at the information provided and wrote down the first item she saw). Pt has not been using "brain breaks" when her daughter is present because her daughter follows her wherever she goes. SLP provided some suggestions in "pt instructions" for pt and husband to try at home. SLP provided homework for auditory attention/processing for husband to complete with pt.  09/06/21: Pt showed SLP her written homework. On her corrected copy she still had errors. SLP assisted pt in organizing her responses and saw approx 9 minutes into task that pt stopped completely. Pt endorsed mental fatigue so SLP asked pt to take a brain break for 3 minutes. Pt returned to task with better sustained/selective attention for remaining 6 minutes of task. SLP then educated and instructed pt on the importance of and examples of brain breaks when completing focused tasks. SLP asked pt what situations she has found herself that her brain needs rest from focusing  and she said TV shows. SLP suggested stopping/pausing TV and doing mini-meditation session for 3-5 minutes and returning to task after brain break. SLP shared details of this session with pt's husband (with pt's permission).  08/29/21: CLQTwas completed today The Cognitive Linguistic Quick Test (CLQT) was administered to assess the relative status of five cognitive domains: attention, memory, language, executive functioning, and visuospatial skills. Scores from 10 tasks were used to estimate severity ratings (standardized for age groups 18-69 years and 70-89 years) for each domain, a clock drawing task, as well as an overall composite severity rating of cognition.      Task Score Criterion Cut Scores  Personal Facts 8/8 8  Symbol Cancellation 12/12 11  Confrontation Naming 10/10 10  Clock Drawing  10/13 12  Story Retelling 8/10 6  Symbol Trails 7/10 9  Generative Naming 4/10 5  Design Memory 6/6 5  Mazes  4/8 7  Design Generation 8/13 6    Cognitive Domain Composite Score Severity Rating  Attention 181/215 WNL (low)  Memory 168/185 WNL  Executive Function 23/40 Mild  Language 30/37 WNL (low)  Visuospatial Skills 82/105 WNL (low)  Clock Drawing  10/13 Mild  Composite Severity Rating 3.8 WNL   Overall composite rating is "WNL" however on CLQT pt demonstrated decr'd skills in awareness, attention, executive function, and a lack of impulse  control. Impulsivity decr'd pt's score specifically with clock drawing, mazes, and symbol trails.   (Date of eval): SLP discussed evaluation results thus far, and Constant Therapy was introduced and explained.     PATIENT REPORTED OUTCOME MEASURES (PROM): Cognitive Function:      PATIENT EDUCATION: Education details: See "today's treatment" Person educated: Patient Education method: Explanation Education comprehension: verbalized understanding and needs further education   GOALS: Goals reviewed with patient? Yes  SHORT TERM GOALS:  Target  date: 10/11/2021 (extended due to only 6 therapy sessions since eval)  Pt will demo [facility] selective attention with functional tasks in mod noisy environment for 20 minutes in 2 sessions  Baseline: 09/26/21 Goal status: met  2.  Pt will demo WFL alternating attention between billpay task and additional random tasks in 2 sessions  Baseline: 10/05/21 Goal status: ongoing  3.  Pt will acknowledge at least two areas of deficit during three therapy sessions  Baseline: 09/19/21, 09-26-21 Goal status: Met  4.  Joanne Jackson will note 80% of errors in therapy tasks when given chance to self correct in 3 sessions Baseline: 09/19/21, 10-05-21 Goal status: revised  5.  Joanne Jackson will reportedly demonstrate less frequent "absent-mindedness" in the home (doors open, lights on, etc) than when compared to prior to OP ST, as reported by pt or E  Baseline:  Goal status: Met  LONG TERM GOALS: Target date: 10/24/2021    Joanne Jackson will score better on E's and her own Cognitive PROM than during her first therapy session Baseline:  Goal status: Ongoing  2.  Pt will demo functional alternating attention with mod complex tasks in therapy session x3 and will reportedly demo functional alternating attention at home as reported by pt or by E, x3  Baseline:  Goal status: Ongoing  3.  Joanne Jackson will indicate anticipatory awareness during the session or at home as reported by pt or E, x5 Baseline:  Goal status: Ongoing  4.  Pt will exhibit WFL organization and planning by paying bills independently Baseline:  Goal status: Ongoing   ASSESSMENT:  CLINICAL IMPRESSION: Patient is a 46 y.o. female who was seen today for cognitive communication tx. SEE THERAPY NOTE. Pt's "absent mindedness" is improved with use of sticky notes, however pt cont to exhibit reduced emergent awareness, attention skills, and attention to detail. She and E have a 74 year old child who has begun kindergarten, and Joanne Jackson requires improved cognition for responsibilities at  home.   OBJECTIVE IMPAIRMENTS include attention, memory, awareness, and executive functioning. These impairments are limiting patient from return to work, managing medications, managing appointments, managing finances, household responsibilities, and effectively communicating at home and in community. Factors affecting potential to achieve goals and functional outcome are ability to learn/carryover information and co-morbidities.. Patient will benefit from skilled SLP services to address above impairments and improve overall function.  REHAB POTENTIAL: Good  PLAN: SLP FREQUENCY: 2x/week  SLP DURATION: 12 weeks  PLANNED INTERVENTIONS: Language facilitation, Environmental controls, Cueing hierachy, Cognitive reorganization, Internal/external aids, Functional tasks, SLP instruction and feedback, and Compensatory strategies    Shandelle Borrelli, CCC-SLP 10/13/2021, 9:02 AM

## 2021-10-15 ENCOUNTER — Encounter: Payer: Self-pay | Admitting: Obstetrics & Gynecology

## 2021-10-16 ENCOUNTER — Other Ambulatory Visit: Payer: Self-pay

## 2021-10-16 DIAGNOSIS — N926 Irregular menstruation, unspecified: Secondary | ICD-10-CM

## 2021-10-17 NOTE — Telephone Encounter (Signed)
Do you still want to order CBC?

## 2021-10-18 NOTE — Telephone Encounter (Signed)
Appointments Dr.Lavoie would like patient to have ultrasound, Kathy placed the order. It appears she is scheduled for an office visit on Friday she would like to cancel that.

## 2021-10-18 NOTE — Telephone Encounter (Signed)
Dr.Lavoie replied "No need for a CBC.  Just schedule a Pelvic US."

## 2021-10-19 ENCOUNTER — Other Ambulatory Visit: Payer: Self-pay | Admitting: *Deleted

## 2021-10-19 ENCOUNTER — Ambulatory Visit (INDEPENDENT_AMBULATORY_CARE_PROVIDER_SITE_OTHER): Payer: 59

## 2021-10-19 ENCOUNTER — Ambulatory Visit (INDEPENDENT_AMBULATORY_CARE_PROVIDER_SITE_OTHER): Payer: 59 | Admitting: Obstetrics & Gynecology

## 2021-10-19 ENCOUNTER — Inpatient Hospital Stay: Payer: 59 | Attending: Radiation Oncology

## 2021-10-19 ENCOUNTER — Ambulatory Visit
Admission: RE | Admit: 2021-10-19 | Discharge: 2021-10-19 | Disposition: A | Discharge status: Home / Self Care | Payer: Self-pay | Source: Ambulatory Visit | Attending: Internal Medicine | Admitting: Internal Medicine

## 2021-10-19 ENCOUNTER — Encounter: Payer: Self-pay | Admitting: Obstetrics & Gynecology

## 2021-10-19 ENCOUNTER — Other Ambulatory Visit: Payer: Self-pay

## 2021-10-19 ENCOUNTER — Encounter: Payer: Self-pay | Admitting: Internal Medicine

## 2021-10-19 ENCOUNTER — Inpatient Hospital Stay (HOSPITAL_BASED_OUTPATIENT_CLINIC_OR_DEPARTMENT_OTHER): Payer: 59 | Admitting: Internal Medicine

## 2021-10-19 VITALS — BP 94/62 | HR 60

## 2021-10-19 VITALS — BP 125/90 | Temp 98.2°F | Resp 18 | Wt 148.0 lb

## 2021-10-19 DIAGNOSIS — Z79899 Other long term (current) drug therapy: Secondary | ICD-10-CM | POA: Diagnosis not present

## 2021-10-19 DIAGNOSIS — C711 Malignant neoplasm of frontal lobe: Secondary | ICD-10-CM | POA: Diagnosis present

## 2021-10-19 DIAGNOSIS — N926 Irregular menstruation, unspecified: Secondary | ICD-10-CM

## 2021-10-19 DIAGNOSIS — Z9221 Personal history of antineoplastic chemotherapy: Secondary | ICD-10-CM | POA: Insufficient documentation

## 2021-10-19 DIAGNOSIS — C719 Malignant neoplasm of brain, unspecified: Secondary | ICD-10-CM

## 2021-10-19 DIAGNOSIS — N469 Male infertility, unspecified: Secondary | ICD-10-CM

## 2021-10-19 LAB — CBC WITH DIFFERENTIAL (CANCER CENTER ONLY)
Abs Immature Granulocytes: 0.01 10*3/uL (ref 0.00–0.07)
Basophils Absolute: 0 10*3/uL (ref 0.0–0.1)
Basophils Relative: 0 %
Eosinophils Absolute: 0 10*3/uL (ref 0.0–0.5)
Eosinophils Relative: 1 %
HCT: 37.7 % (ref 36.0–46.0)
Hemoglobin: 13.2 g/dL (ref 12.0–15.0)
Immature Granulocytes: 0 %
Lymphocytes Relative: 11 %
Lymphs Abs: 0.4 10*3/uL — ABNORMAL LOW (ref 0.7–4.0)
MCH: 30.6 pg (ref 26.0–34.0)
MCHC: 35 g/dL (ref 30.0–36.0)
MCV: 87.5 fL (ref 80.0–100.0)
Monocytes Absolute: 0.3 10*3/uL (ref 0.1–1.0)
Monocytes Relative: 8 %
Neutro Abs: 3 10*3/uL (ref 1.7–7.7)
Neutrophils Relative %: 80 %
Platelet Count: 164 10*3/uL (ref 150–400)
RBC: 4.31 MIL/uL (ref 3.87–5.11)
RDW: 12.3 % (ref 11.5–15.5)
WBC Count: 3.8 10*3/uL — ABNORMAL LOW (ref 4.0–10.5)
nRBC: 0 % (ref 0.0–0.2)

## 2021-10-19 LAB — CMP (CANCER CENTER ONLY)
ALT: 16 U/L (ref 0–44)
AST: 13 U/L — ABNORMAL LOW (ref 15–41)
Albumin: 4.3 g/dL (ref 3.5–5.0)
Alkaline Phosphatase: 49 U/L (ref 38–126)
Anion gap: 5 (ref 5–15)
BUN: 15 mg/dL (ref 6–20)
CO2: 26 mmol/L (ref 22–32)
Calcium: 9.4 mg/dL (ref 8.9–10.3)
Chloride: 106 mmol/L (ref 98–111)
Creatinine: 0.52 mg/dL (ref 0.44–1.00)
GFR, Estimated: >60 mL/min (ref >60)
Glucose, Bld: 102 mg/dL — ABNORMAL HIGH (ref 70–99)
Potassium: 4 mmol/L (ref 3.5–5.1)
Sodium: 137 mmol/L (ref 135–145)
Total Bilirubin: 0.8 mg/dL (ref 0.3–1.2)
Total Protein: 6.8 g/dL (ref 6.5–8.1)

## 2021-10-19 MED ORDER — TEMOZOLOMIDE 140 MG PO CAPS: 150.0000 mg/m2/d | ORAL_CAPSULE | Freq: Every day | ORAL | 0 refills | Status: DC

## 2021-10-19 MED ORDER — DOCUSATE SODIUM 100 MG PO CAPS: 100.0000 mg | ORAL_CAPSULE | Freq: Two times a day (BID) | ORAL | 0 refills | Status: DC

## 2021-10-19 MED ORDER — ONDANSETRON HCL 8 MG PO TABS: 8.0000 mg | ORAL_TABLET | Freq: Two times a day (BID) | ORAL | 1 refills | Status: DC | PRN

## 2021-10-19 NOTE — Progress Notes (Signed)
Requested MRI from [facility].

## 2021-10-19 NOTE — Progress Notes (Unsigned)
    Joanne Jackson 02-20-1975 [id number]        46 y.o.  G1P1L1 Married.  IVF baby.  RP: Irregular menses for Pelvic US  HPI: Had Brain surgery for cancer, followed by Chemotherapy in 05/2021.  Menses every 18-24 days since then, lasting about 5 days with moderate flow.  No pelvic pain. No abnormal vaginal discharge.  Female factor infertility, successful IVF.   OB History  Gravida Para Term Preterm AB Living  '1 1 1     1  '$ SAB IAB Ectopic Multiple Live Births               # Outcome Date GA Lbr Len/2nd Weight Sex Delivery Anes PTL Lv  1 Term             Past medical history,surgical history, problem list, medications, allergies, family history and social history were all reviewed and documented in the EPIC chart.   Directed ROS with pertinent positives and negatives documented in the history of present illness/assessment and plan.  Exam:  Vitals:   10/19/21 1008  BP: 94/62  Pulse: 60  SpO2: 99%   General appearance:  Normal  Pelvic US today: T/V images.  Retroverted uterus normal in size and shape with inhomogeneous myometrium with streaky shadowing suggestive of adenomyosis.  2 small intramural fibroids measured at 0.8 cm and 0.92 cm.  The overall uterine size is measured at 8.07 x 5.62 x 4.65 cm.  Thin and symmetrical endometrial lining measured at 1.95 mm.  No mass or thickening or abnormal blood flow seen at the endometrium.  Both ovaries are small with sparse follicles.  No adnexal mass.  No free fluid in the pelvis.   Assessment/Plan:  46 y.o. G1P1001   1. Irregular menses Had Brain surgery for cancer, followed by Chemotherapy in 05/2021.  Menses every 18-24 days since then, lasting about 5 days with moderate flow.  No pelvic pain. No abnormal vaginal discharge.  Female factor infertility, successful IVF.  Pelvic ultrasound findings thoroughly reviewed with patient.  Patient reassured that her endometrial lining is normal and thin at 1.95 mm.  Irregular menses probably due to  the stress of brain surgery for cancer and the followed chemotherapy in April 2023.  We will continue to observe.  2. Female infertility Female factor infertility.  IVF baby.  Declines contraception.  Other orders - traZODone (DESYREL) 100 MG tablet; Take 100 mg by mouth at bedtime. - omeprazole (PRILOSEC) 20 MG capsule; Take 20 mg by mouth daily.   Princess Bruins MD, 10:37 AM 10/19/2021

## 2021-10-19 NOTE — Progress Notes (Signed)
[facility] [place] [address] [place], [place] [address] [phone]   Interval Evaluation  Date of Service: 10/19/21 Patient Name: Joanne Jackson Patient MRN: [id number] Patient DOB: [birth year] Provider: Ventura Sellers, MD  Identifying Statement:  Joanne Jackson is a 46 y.o. 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 Tommi Rumps at [facility]; path demonstrates High Grade Glioma IDH-56m.   06/19/2021 - 06/19/2021 Chemotherapy   Patient is on Treatment Plan : BRAIN GLIOBLASTOMA Radiation Therapy With Concurrent Temozolomide 75 mg/m2 Daily Followed By Sequential Maintenance Temozolomide x 6-12 cycles     08/28/2021 -  Chemotherapy   Patient is on Treatment Plan : BRAIN GLIOBLASTOMA Consolidation Temozolomide Days 1-5 q28 Days        Biomarkers:  MGMT Unknown.  IDH 1/2 Mutated.  EGFR Unknown  TERT Unmutated   Interval History: Joanne Jackson today for follow up, now having completed cycle #2 adjuvant Temodar.  She had significant nausea and vomiting with the higher dose of chemo.  Constipation was more severe as well.  She has not been using her CPAP mask due to discomfort, sleep has been very poor again in recent weeks.  Currently on trazodone 79mHS.    H+P (06/12/21) Patient presented to medical attention in March 2023 with new onset seizures, characterized by shaking of the left hand without impaired awareness.  CNS imaging demonstrated non enhancing mass within right frontal lobe, c/w primary neoplasm.  She underwent craniotomy, [facility]18[facility]  Since surgery, husband has noticed more blunted (depressed?) mood, less responsive and engaging.  Otherwise she is fully independent and functional.  [facility]  Medications: Current Outpatient Medications on File Prior to Visit  Medication Sig Dispense Refill   Cholecalciferol (VITAMIN D3) 50 MCG (2000 UT) TABS Take 2,000 Units by mouth daily as needed.      ferrous sulfate 324 (65 Fe) MG TBEC TAKE 1 TABLET BY MOUTH EVERY DAY (Patient taking differently: Take 324 mg by mouth daily.) 90 tablet 1   levETIRAcetam (KEPPRA) 500 MG tablet Take 1 tablet (500 mg total) by mouth 2 (two) times daily. 60 tablet 2   lisinopril (ZESTRIL) 10 MG tablet Take 1 tablet (10 mg total) by mouth daily. 90 tablet 3   metoCLOPramide (REGLAN) 10 MG tablet Take 1 tablet (10 mg total) by mouth every 8 (eight) hours as needed for up to 12 doses for nausea. 12 tablet 0   RESTASIS 0.05 % ophthalmic emulsion Place 1 drop into both eyes 2 (two) times daily.     temozolomide (TEMODAR) 140 MG capsule Take 1 capsule (140 mg total) by mouth daily. May take on an empty stomach to decrease nausea & vomiting. 5 capsule 0   temozolomide (TEMODAR) 180 MG capsule Take 1 capsule (180 mg total) by mouth daily. May take on an empty stomach to decrease nausea & vomiting. 5 capsule 0   temozolomide (TEMODAR) 20 MG capsule Take 1 capsule (20 mg total) by mouth daily. May take on an empty stomach to decrease nausea & vomiting. 5 capsule 0   No current facility-administered medications on file prior to visit.    Allergies: No Known Allergies Past  Medical History:  Past Medical History:  Diagnosis Date   Brain cancer ([place])    Glioma ([place]) 05/18/2021   Hypertension    Pre-diabetes    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[facility])    Past Surgical History:  Past Surgical History:  Procedure Laterality Date   BRAIN SURGERY  05/2021   tumor removal   CESAREAN SECTION     DILATION AND CURETTAGE, DIAGNOSTIC / THERAPEUTIC     Social History:  Social History   Socioeconomic History   Marital status: Married     Spouse name: Not on file   Number of children: 1   Years of education: Not on file   Highest education level: Not on file  Occupational History   Occupation: Accounting  Tobacco Use   Smoking status: Never    Passive exposure: Never   Smokeless tobacco: Never  Vaping Use   Vaping Use: Never used  Substance and Sexual Activity   Alcohol use: Not Currently   Drug use: Never   Sexual activity: Yes    Partners: Male    Birth control/protection: Other-see comments, Condom    Comment: husband is infertile  Other Topics Concern   Not on file  Social History Narrative   Not on file   Social Determinants of Health   Financial Resource Strain: Not on file  Food Insecurity: Not on file  Transportation Needs: Not on file  Physical Activity: Not on file  Stress: Not on file  Social Connections: Not on file  Intimate Partner Violence: Not on file   Family History:  Family History  Problem Relation Age of Onset   Thyroid disease Mother    Heart attack Father 40   Hypertension Father    Healthy Brother    Healthy Paternal Grandfather    Healthy Daughter     Review of Systems: Constitutional: Doesn't report fevers, chills or abnormal weight loss Eyes: Doesn't report blurriness of vision Ears, nose, mouth, throat, and face: Doesn't report sore throat Respiratory: Doesn't report cough, dyspnea or wheezes Cardiovascular: Doesn't report palpitation, chest discomfort  Gastrointestinal:  Doesn't report nausea, constipation, diarrhea GU: Doesn't report incontinence Skin: Doesn't report skin rashes Neurological: Per HPI Musculoskeletal: Doesn't report joint pain Behavioral/Psych: Doesn't report anxiety  Physical Exam: Vitals:   10/19/21 1200  BP: (!) 125/90  Resp: 18  Temp: 98.2 F (36.8 C)  SpO2: 98%    KPS: 90. General: Alert, cooperative, pleasant, in no acute distress Head: Normal EENT: No conjunctival injection or scleral icterus.  Lungs: Resp effort  normal Cardiac: Regular rate Abdomen: Non-distended abdomen Skin: No rashes cyanosis or petechiae. Extremities: No clubbing or edema  Neurologic Exam: Mental Status: Awake, alert, attentive to examiner. Oriented to self and environment. Language is fluent with intact comprehension. Abulic features noted. Cranial Nerves: Visual acuity is grossly normal. Visual fields are full. Extra-ocular movements intact. No ptosis. Face is symmetric Motor: Tone and bulk are normal. Power is full in both arms and legs. Reflexes are symmetric, no pathologic reflexes present.  Sensory: Intact to light touch Gait: Normal.   Labs: I have reviewed the data as listed    Component Value Date/Time   NA 137 09/25/2021 1139   K 3.5 09/25/2021 1139   CL 105 09/25/2021 1139   CO2 28 09/25/2021 1139   GLUCOSE 98 09/25/2021 1139   BUN 10 09/25/2021 1139   CREATININE 0.62 09/25/2021 1139   CREATININE 0.50 11/24/2019 1449   CALCIUM 9.8 09/25/2021 1139   PROT  7.0 09/25/2021 1139   ALBUMIN 4.5 09/25/2021 1139   AST 15 09/25/2021 1139   ALT 15 09/25/2021 1139   ALKPHOS 43 09/25/2021 1139   BILITOT 0.9 09/25/2021 1139   GFRNONAA >60 09/25/2021 1139   GFRNONAA 119 11/24/2019 1449   GFRAA 138 11/24/2019 1449   Lab Results  Component Value Date   WBC 3.8 (L) 10/19/2021   NEUTROABS 3.0 10/19/2021   HGB 13.2 10/19/2021   HCT 37.7 10/19/2021   MCV 87.5 10/19/2021   PLT 164 10/19/2021    Imaging:    Assessment/Plan 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  MRI brain demonstrates stable findings.  We recommended continuing treatment with cycle #3 Temozolomide, reduced to 150 mg/m2, on for five days and off for twenty three days in twenty eight day cycles. The patient will have a complete blood count performed on days 21 and 28 of each cycle, and a comprehensive metabolic panel performed on day 28 of each cycle.  Labs may need to be performed more often. Zofran will prescribed for home use for nausea/vomiting.   Chemotherapy should be held for the following:  ANC less than 1,000  Platelets less than 100,000  LFT or creatinine greater than 2x ULN  If clinical concerns/contraindications develop  May increase trazodone to 125m HS.  For OSA and insomnia, working to get insurance approval for CPAP titration study.  Can start colace 1022mBID for constipation.  Keppra will con't [id number].  We appreciate the opportunity to participate in the care of Trinadee Fanguy.   We ask that GanCleva Cameroturn to clinic in 1 months prior to cycle #4 with labs for evaluation, or sooner as needed.  All questions were answered. The patient knows to call the clinic with any problems, questions or concerns. No barriers to learning were detected.  The total time spent in the encounter was 30 minutes and more than 50% was on counseling and review of test results   ZacVentura SellersD Medical Director of Neuro-Oncology [facility]/14/23 12:45 PM

## 2021-10-20 ENCOUNTER — Ambulatory Visit: Payer: 59 | Admitting: Obstetrics & Gynecology

## 2021-10-23 ENCOUNTER — Inpatient Hospital Stay: Payer: 59

## 2021-10-23 ENCOUNTER — Encounter: Payer: Self-pay | Admitting: Obstetrics & Gynecology

## 2021-10-30 ENCOUNTER — Ambulatory Visit (INDEPENDENT_AMBULATORY_CARE_PROVIDER_SITE_OTHER): Payer: 59 | Admitting: Primary Care

## 2021-10-30 ENCOUNTER — Encounter: Payer: Self-pay | Admitting: Primary Care

## 2021-10-30 ENCOUNTER — Encounter: Payer: Self-pay | Admitting: Internal Medicine

## 2021-10-30 VITALS — BP 110/82 | HR 58 | Temp 98.8°F | Ht 59.0 in | Wt 149.6 lb

## 2021-10-30 DIAGNOSIS — G4733 Obstructive sleep apnea (adult) (pediatric): Secondary | ICD-10-CM

## 2021-10-30 DIAGNOSIS — G4709 Other insomnia: Secondary | ICD-10-CM

## 2021-10-30 MED ORDER — ESZOPICLONE 2 MG PO TABS: 2.0000 mg | ORAL_TABLET | Freq: Every evening | ORAL | 1 refills | Status: DC | PRN

## 2021-10-30 NOTE — Patient Instructions (Signed)
You have moderate sleep apnea, home sleep study showed you stop breathing on average 22 times an hour  Sleep apnea puts you at high risk for cardiac arrhythmias, pulmonary hypertension, stroke or diabetes  Treatment options include weight loss, side sleeping position or elevate head 30 degrees with wedge pillow, oral appliance, CPAP therapy or referral to ENT for possible surgical options  Sleep apnea: - Recommend you get a wedge pillow to elevate your head while sleeping at night - Consider getting an oral device for treatment of your sleep apnea.  We will refer you to an orthodontic dentist who can do this.  Insomnia: - Recommend that you [facility] to [facility] their number is [phone].   - Increase Lunesta to 2 mg to take at bedtime for insomnia  Follow-up - 4 to 6 weeks with any of our sleep doctors (Dr. Annamaria Boots, olalere, alva or sood) for insomnia and OSA/  if nothing open can see [employer]

## 2021-10-30 NOTE — Progress Notes (Unsigned)
$'@Patient'i$  ID: Joanne Jackson, female    DOB: 11-07-1975, 46 y.o.   MRN: [id number]  No chief complaint on file.   Referring provider: Leamon Arnt, MD  HPI: 46 year old female, never smoked.  Past medical history significant for hypertension, dyslipidemia, iron deficiency anemia, prediabetes, insomnia, Sjogren's syndrome.  10/30/2021 Patient presents today as self-referral for sleep consult due to insomnia.  No prior sleep studies. She is on trazodone '100mg'$  at bedtime.   Sleep apnea dx by home test end of July, AHI 22/hour. She wore this for 2-3 weeks and stopped using CPAP machine. States that it did not help her fall asleep. CPAP machine was a distraction. She can not fall asleep.  She has tried trazodone, temazepam '15mg'$  and Lunesta '1mg'$ . All medications helped for a couple of days and stopped workings. She never had study in  They agree that she has sleep apnea but main issues is difficulty fallings asleep. She is on chemo therapy now. Before she had surgery she did not insomnia.   CPAP intolerant, not able to use. Causing more problems. Not clear if mask or pressure issues.  Insurance '2mg'$  lunesta  Wedge pillow   Sleep questionnaire Symptoms-  Can not fall asleep Prior sleep study- Yes, HST in 2023 Bedtime- 10pm Time to fall asleep- All night  Nocturnal awakenings- 3-4 times Out of bed/start of day- 7am Weight changes- None Do you operate heavy machinery- No Do you currently wear CPAP-  Previous on auto CPAP, did not tolerate Do you current wear oxygen- No Ep[place]- 0   No Known Allergies  Immunization History  Administered Date(s) Administered   Influenza,inj,Quad PF,6+ Mos 11/11/2015, 10/24/2018, 12/02/2019, 10/25/2020   Moderna Sars-Covid-2 Vaccination 05/29/2019, 06/19/2019   PFIZER(Purple Top)SARS-COV-2 Vaccination 01/19/2020   Tdap 10/14/2015    Past Medical History:  Diagnosis Date   Brain cancer ([place])    Glioma ([place]) 05/18/2021   Hypertension     Pre-diabetes    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      docusate sodium (COLACE) 100 MG capsule Take 1 capsule (100 mg total) by mouth 2 (two) times daily. 10 capsule 0   ferrous sulfate 324 (65 Fe) MG TBEC TAKE 1 TABLET BY MOUTH EVERY DAY (Patient taking differently: Take 324 mg by mouth daily.) 90 tablet 1   levETIRAcetam (KEPPRA) 500 MG tablet Take 1 tablet (500 mg total) by mouth 2 (two) times daily. 60 tablet 2   lisinopril (ZESTRIL) 10 MG tablet Take 1 tablet (10 mg total) by mouth daily. 90 tablet 3   metoCLOPramide (REGLAN) 10 MG tablet Take 1 tablet (10 mg total) by mouth every 8 (eight) hours as needed for up to 12 doses for nausea. 12 tablet 0   omeprazole (PRILOSEC) 20 MG capsule Take 20 mg by mouth daily.     ondansetron (ZOFRAN) 8 MG tablet Take 1 tablet (8 mg total) by mouth 2 (two) times daily as needed (nausea and vomiting). May take 30-60 minutes prior to Temodar administration if nausea/vomiting occurs. 30 tablet 1   RESTASIS 0.05 % ophthalmic emulsion Place 1 drop into both eyes 2 (two) times daily.  temozolomide (TEMODAR) 140 MG capsule Take 1 capsule (140 mg total) by mouth daily. May take on an empty stomach to decrease nausea & vomiting. 5 capsule 0   temozolomide (TEMODAR) 140 MG capsule Take 2 capsules (280 mg total) by mouth daily. May take on an empty stomach to decrease nausea & vomiting. 10 capsule 0   temozolomide (TEMODAR) 180 MG capsule Take 1 capsule (180 mg total) by mouth daily. May take on an empty stomach to decrease nausea & vomiting. 5 capsule 0   temozolomide (TEMODAR) 20 MG capsule Take 1 capsule (20 mg  total) by mouth daily. May take on an empty stomach to decrease nausea & vomiting. 5 capsule 0   traZODone (DESYREL) 100 MG tablet Take 100 mg by mouth at bedtime.     No facility-administered medications prior to visit.   Review of Systems  Review of Systems  Constitutional:  Positive for fatigue.  HENT: Negative.    Respiratory:  Positive for apnea.   Psychiatric/Behavioral:  Positive for sleep disturbance.      Physical Exam  LMP 09/30/2021 (Exact Date)  Physical Exam Constitutional:      Appearance: Normal appearance.  HENT:     Head: Normocephalic and atraumatic.     Mouth/Throat:     Mouth: Mucous membranes are moist.     Pharynx: Oropharynx is clear.  Cardiovascular:     Rate and Rhythm: Normal rate and regular rhythm.  Pulmonary:     Effort: Pulmonary effort is normal.     Breath sounds: Normal breath sounds.  Neurological:     General: No focal deficit present.     Mental Status: She is alert and oriented to person, place, and time. Mental status is at baseline.  Psychiatric:        Mood and Affect: Mood normal.        Behavior: Behavior normal.        Thought Content: Thought content normal.        Judgment: Judgment normal.      Lab Results:  CBC    Component Value Date/Time   WBC 3.8 (L) 10/19/2021 1212   WBC 2.7 (L) 10/04/2021 1325   RBC 4.31 10/19/2021 1212   HGB 13.2 10/19/2021 1212   HCT 37.7 10/19/2021 1212   PLT 164 10/19/2021 1212   MCV 87.5 10/19/2021 1212   MCH 30.6 10/19/2021 1212   MCHC 35.0 10/19/2021 1212   RDW 12.3 10/19/2021 1212   LYMPHSABS 0.4 (L) 10/19/2021 1212   MONOABS 0.3 10/19/2021 1212   EOSABS 0.0 10/19/2021 1212   BASOSABS 0.0 10/19/2021 1212    BMET    Component Value Date/Time   NA 137 10/19/2021 1212   K 4.0 10/19/2021 1212   CL 106 10/19/2021 1212   CO2 26 10/19/2021 1212   GLUCOSE 102 (H) 10/19/2021 1212   BUN 15 10/19/2021 1212   CREATININE 0.52 10/19/2021 1212   CREATININE 0.50 11/24/2019 1449    CALCIUM 9.4 10/19/2021 1212   GFRNONAA >60 10/19/2021 1212   GFRNONAA 119 11/24/2019 1449   GFRAA 138 11/24/2019 1449    BNP No results found for: "BNP"  ProBNP No results found for: "PROBNP"  Imaging: US Transvaginal Non-OB  Result Date: 10/28/2021 T/V images.  Retroverted uterus normal in size and shape with inhomogeneous myometrium with streaky shadowing suggestive of adenomyosis.  2 small intramural fibroids measured at 0.8 cm and 0.92 cm.  The overall uterine size is measured at 8.07 x 5.62 x 4.65  cm.  Thin and symmetrical endometrial lining measured at 1.95 mm.  No mass or thickening or abnormal blood flow seen at the endometrium.  Both ovaries are small with sparse follicles.  No adnexal mass.  No free fluid in the pelvis.      Assessment & Plan:   No problem-specific Assessment & Plan notes found for this encounter.     Martyn Ehrich, NP 10/30/2021

## 2021-10-31 ENCOUNTER — Encounter: Payer: Self-pay | Admitting: Internal Medicine

## 2021-10-31 ENCOUNTER — Encounter: Payer: Self-pay | Admitting: Primary Care

## 2021-10-31 ENCOUNTER — Telehealth: Payer: Self-pay

## 2021-10-31 ENCOUNTER — Other Ambulatory Visit (HOSPITAL_COMMUNITY): Payer: Self-pay

## 2021-10-31 ENCOUNTER — Institutional Professional Consult (permissible substitution): Payer: 59 | Admitting: Primary Care

## 2021-10-31 DIAGNOSIS — G4733 Obstructive sleep apnea (adult) (pediatric): Secondary | ICD-10-CM | POA: Insufficient documentation

## 2021-10-31 NOTE — Telephone Encounter (Signed)
[name], please advise on pt's message regarding the phone number that was provided at the last visit. Thanks.

## 2021-10-31 NOTE — Progress Notes (Signed)
Reviewed and agree with assessment/plan.   Chesley Mires, MD [facility] 10/31/2021, 9:13 AM Pager:  [phone]

## 2021-10-31 NOTE — Assessment & Plan Note (Signed)
-   Patient has an exteremly hard time falling asleep. She has tried and failed several sleep medications including Trazodone '100mg'$ , Temazepam '15mg'$  and Lunesta '1mg'$ . Plan increase Lunesta '2mg'$  qhs, she will likely need prior authorization. She would benefit from CBT for sleep training, contact information given and advised she reach out to schedule visit (phone- [phone])

## 2021-10-31 NOTE — Telephone Encounter (Signed)
PA request received for Eszopiclone '2mg'$  tablets in CMM through [facility].   PA has been submitted and we are awaiting determination.   Per test claim insurance will pay for 15 tablets per 30 days.  Previous trials of Trazodone, Temazepam, Eszopiclone '1mg'$  are documented in patients file.   KeyEugene Garnet PA Case ID: [id number]

## 2021-10-31 NOTE — Assessment & Plan Note (Addendum)
-   HST July 2023 with Dr. Michela Pitcher showed moderate OSA, AHI 22/hour. Intolerant to CPAP, unsure if mask or pressure issue. I doubt she will continue to wear, we may want to have her go into sleep lab for formal titration study/mask fitting but we will hold off on this for now. Advised she try wedge pillow and we will refer to orthodontics for oral appliance consideration. Other options discussed were Inspire device with ENT.

## 2021-10-31 NOTE — Telephone Encounter (Signed)
Joanne Jackson can you get CBT contact number from Dr. Annamaria Boots. He should have it. Please send this back to me so I can update my information

## 2021-10-31 NOTE — Telephone Encounter (Signed)
  I apologize I wrote the number down wrong, it's [phone]

## 2021-11-01 ENCOUNTER — Other Ambulatory Visit (HOSPITAL_COMMUNITY): Payer: Self-pay

## 2021-11-01 ENCOUNTER — Encounter: Payer: Self-pay | Admitting: Internal Medicine

## 2021-11-01 NOTE — Telephone Encounter (Signed)
Received notification from [facility] regarding a prior authorization for Eszopiclone '2MG'$  tablets.   Authorization has been APPROVED from 10-31-2021 to 10-31-2024.   KeyEugene Garnet PA Case ID: [id number]

## 2021-11-02 ENCOUNTER — Other Ambulatory Visit: Payer: Self-pay | Admitting: Internal Medicine

## 2021-11-02 DIAGNOSIS — C719 Malignant neoplasm of brain, unspecified: Secondary | ICD-10-CM

## 2021-11-02 NOTE — Telephone Encounter (Signed)
Patient will have lab and visit prior to Rx being renewed.

## 2021-11-02 NOTE — Telephone Encounter (Signed)
Mychart message sent by pt: Joanne Jackson "Joanne Jackson [facility] (supporting Martyn Ehrich, NP) 8 hours ago (8:34 AM)   GX Hi Dr. Volanda Napoleon, It was nice meeting you on Monday and thank you for your advice! The '2mg'$  lunesta seems not working. I took two nights already. First night 3 hours sleep. Last night, no sleep at all. Please give me some advice. Thank you so much and have a great day! [name]"[name]"   [name], please advise.

## 2021-11-03 NOTE — Telephone Encounter (Signed)
She has tried several sleep aids with no effect, likely another medication is not gonna be helpful. Really needs to contact CBT for sleep training and she needs CPAP titration study in Lab since she has sleep apnea and it is playing a role in her sleep quality. Please get her a visit with sleep doc- first available.

## 2021-11-06 ENCOUNTER — Ambulatory Visit (INDEPENDENT_AMBULATORY_CARE_PROVIDER_SITE_OTHER): Payer: 59 | Admitting: Internal Medicine

## 2021-11-06 ENCOUNTER — Other Ambulatory Visit: Payer: Self-pay | Admitting: Obstetrics & Gynecology

## 2021-11-06 ENCOUNTER — Encounter: Payer: Self-pay | Admitting: Internal Medicine

## 2021-11-06 VITALS — BP 118/70 | HR 68 | Ht 59.0 in | Wt 149.8 lb

## 2021-11-06 DIAGNOSIS — M3501 Sicca syndrome with keratoconjunctivitis: Secondary | ICD-10-CM | POA: Diagnosis not present

## 2021-11-06 DIAGNOSIS — G4709 Other insomnia: Secondary | ICD-10-CM

## 2021-11-06 DIAGNOSIS — G4733 Obstructive sleep apnea (adult) (pediatric): Secondary | ICD-10-CM

## 2021-11-06 DIAGNOSIS — Z1231 Encounter for screening mammogram for malignant neoplasm of breast: Secondary | ICD-10-CM

## 2021-11-06 NOTE — Therapy (Signed)
OUTPATIENT PHYSICAL THERAPY NEURO EVALUATION   Patient Name: Joanne Jackson MRN: [id number] DOB:05-24-1975, 46 y.o., female Today's Date: 11/07/2021   PCP: Leamon Arnt, MD REFERRING PROVIDER: Ancil Boozer, FNP    PT End of Session - 11/07/21 1613     Visit Number 1    Number of Visits 7    Date for PT Re-Evaluation 12/19/21    Authorization Type [employer]    Authorization - Number of Visits 60   combined   PT Start Time 1027    PT Stop Time 1612    PT Time Calculation (min) 35 min    Activity Tolerance Patient tolerated treatment well    Behavior During Therapy [facility] for tasks assessed/performed;Flat affect             Past Medical History:  Diagnosis Date   Brain cancer ([place])    Glioma ([place]) 05/18/2021   Hypertension    Pre-diabetes    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[place])    Past Surgical History:  Procedure Laterality Date   BRAIN SURGERY  05/2021   tumor removal   CESAREAN SECTION     DILATION AND CURETTAGE, DIAGNOSTIC / THERAPEUTIC     Patient Active Problem List   Diagnosis Date Noted   Moderate obstructive sleep apnea 10/31/2021   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   Dyslipidemia 04/30/2021   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[place]) 04/30/2021   Episode of transient neurologic symptoms 04/28/2021   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[place]) 10/25/2020   History of gestational diabetes 10/25/2020   Iron deficiency anemia 12/03/2019   Tubular adenoma of colon 10/24/2018   Essential hypertension 12/26/2017   Prediabetes 12/26/2017   Intramural leiomyoma of uterus 07/29/2015    ONSET DATE: 05/2021  REFERRING DIAG: C71.9 (ICD-10-CM) - Malignant neoplasm of brain, unspecified  THERAPY DIAG:  Muscle weakness (generalized)  Rationale for Evaluation and Treatment Rehabilitation  SUBJECTIVE:                                                                                                                                                                                               SUBJECTIVE STATEMENT: Patient reports that she is done with radiation but is now on oral chemo meds until next July. Reports constipation from these meds. Reports that her oncologist at [facility] recommended PT since her L side is not strong enough. Runs outside for 15-20 minutes every day. Denies problems with walking or balance, does note fatigue d/t difficulty sleeping which is being addressed by meds.  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 No  PRECAUTIONS: None  WEIGHT BEARING RESTRICTIONS No  FALLS: Has patient fallen in last 6 months? 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 This remains highly concerning for tumor, most likely a low-grade glioma  COGNITION: Overall cognitive status: Within functional limits for tasks assessed   SENSATION: WFL  COORDINATION: Alternating pronation/supination: WNL Alternating toe tap: WNL Finger to nose: WNL    POSTURE: No Significant postural limitations  LOWER EXTREMITY ROM:     Active  Right Eval Left Eval  Hip flexion    Hip extension    Hip abduction    Hip adduction    Hip internal rotation    Hip external rotation    Knee flexion    Knee extension    Ankle dorsiflexion 18 14  Ankle plantarflexion    Ankle inversion    Ankle eversion     (Blank rows = not tested)  LOWER EXTREMITY MMT:    MMT (in sitting) Right Eval Left Eval   Hip flexion 4+ 4  Hip extension    Hip abduction 4+ 3+  Hip adduction 4- 4-  Hip internal rotation    Hip external rotation    Knee flexion 4+ 4-  Knee extension 4+ 4-  Ankle dorsiflexion 4+ 4+  Ankle plantarflexion 4; 20 reps with compensations 4; 20 reps with compensations  Ankle inversion    Ankle eversion    (Blank rows = not tested)  GAIT: Gait pattern: WFL Assistive device utilized: None Level of assistance: Complete Independence   FUNCTIONAL TESTs:  5 times sit to stand: 9.73 sec Functional gait assessment: 29   [facility] PT Assessment - 11/07/21 0001       Standardized Balance Assessment   Standardized Balance Assessment Five Times Sit to Stand    Five times sit to stand comments  9.73 sec   without UEs     Functional Gait  Assessment   Gait Level Surface Walks 20 ft in less than 5.5 sec, no assistive devices, good speed, no evidence for imbalance, normal gait pattern, deviates no more than 6 in outside of the 12 in walkway width.    Change in Gait Speed Able to change speed, demonstrates mild gait deviations, deviates 6-10 in outside of the 12 in walkway width, or no gait deviations, unable to achieve a major change in velocity, or uses a change in velocity, or uses an assistive device.    Gait with Horizontal Head Turns Performs head turns smoothly with no change in gait. Deviates no more than 6 in outside 12 in walkway width    Gait with Vertical Head Turns Performs head turns with no change in gait. Deviates no more than 6 in outside 12 in walkway width.    Gait and Pivot Turn Pivot turns safely within 3 sec and stops quickly with no loss of balance.    Step Over Obstacle Is able to step over 2 stacked shoe boxes taped together (9 in total height) without changing gait speed. No evidence of imbalance.    Gait  with Narrow Base of Support Is able to ambulate for 10 steps heel to toe with no staggering.    Gait with Eyes Closed Walks 20 ft, no assistive devices, good  speed, no evidence of imbalance, normal gait pattern, deviates no more than 6 in outside 12 in walkway width. Ambulates 20 ft in less than 7 sec.    Ambulating Backwards Walks 20 ft, no assistive devices, good speed, no evidence for imbalance, normal gait    Steps Alternating feet, no rail.    Total Score 29              PATIENT EDUCATION: Education details: prognosis, POC, HEP Person educated: Patient Education method: Explanation, Demonstration, Tactile cues, Verbal cues, and Handouts Education comprehension: verbalized understanding and returned demonstration   HOME EXERCISE PROGRAM: Access Code: [email or web address] URL: [email or web address] Date: 11/07/2021 Prepared by: [facility]  Exercises - Marching with Resistance  - 1 x daily - 5 x weekly - 2 sets - 10 reps - Standing Hamstring Curl with Resistance  - 1 x daily - 5 x weekly - 2 sets - 10 reps - Mini Squat with Counter Support  - 1 x daily - 5 x weekly - 2 sets - 10 reps - Sidelying Hip Abduction  - 1 x daily - 5 x weekly - 2 sets - 10 reps - Supine Bridge with Mini Swiss Ball Between Knees  - 1 x daily - 5 x weekly - 2 sets - 10 reps    GOALS: Goals reviewed with patient? Yes  SHORT TERM GOALS: Target date: 11/28/2021  Patient to be independent with initial HEP. Baseline: HEP initiated Goal status: INITIAL    LONG TERM GOALS: Target date: 12/19/2021  Patient to be independent with advanced HEP. Baseline: Not yet initiated  Goal status: INITIAL  Patient to demonstrate B LE strength >/=4/5.  Baseline: See above Goal status: INITIAL  Patient to demonstrate good running form and stability on ground or treadmill.  Baseline: NT Goal status: INITIAL    ASSESSMENT:  CLINICAL IMPRESSION:  Patient is a 46 y/o F presenting to OPPT with c/o L sided weakness s/p  R frontal glioma s/p resection in 05/2021. Denies difficulty with gait or balance, but reports fatigue  from trouble sleeping. Patient runs 15-20 minutes a day for exercise. Patient today presenting with weakness in L hip flexor, abductor, B adductors, L HS, L quad, and B PFs. Balance assessment today indicates a decreased risk of falls. Patient was educated on gentle strengthening HEP and reported understanding. Prior to current episode, patient was independent. Would benefit from skilled PT services 1 x/week for 6 weeks to address aforementioned impairments in order to optimize level of function.    OBJECTIVE IMPAIRMENTS decreased strength.   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   REHAB POTENTIAL: Good  CLINICAL DECISION MAKING: Evolving/moderate complexity  EVALUATION COMPLEXITY: Moderate  PLAN: PT FREQUENCY: 1x/week  PT DURATION: 6 weeks  PLANNED INTERVENTIONS: Therapeutic exercises, Therapeutic activity, Neuromuscular re-education, Balance training, Gait training, Patient/Family education, Self Care, Joint mobilization, Joint manipulation, Stair training, Vestibular training, Canalith repositioning, Visual/preceptual remediation/compensation, Aquatic Therapy, Dry Needling, Electrical stimulation, Cryotherapy, Moist heat, Taping, Manual therapy, and Re-evaluation  PLAN FOR NEXT SESSION: reassess HEP  and progress L LE strengthening; observe running form   Janene Harvey, PT, DPT 11/07/21 4:30 PM  [facility] at [facility] [place], [place],  [address] Phone # [phone] Fax # [phone]

## 2021-11-06 NOTE — Patient Instructions (Signed)
Suggest you try taking your melatonin an hour or two before bedtime. Then try using your existing supply of Lunesta tablets to take 3 mg at bedtime. If you have enough, try this every night for a few nights.  Meanwhile, we need to keep you breathing at night:  Try to sleep off the flat of your back- sleep on your sides or stomach. It may help to elevate your head some- on a wedge or with a brick under each of the head legs of your bed, as we discussed.  Order- referral to Oneal Grout, DDS, orthodontist, to consider a fitted oral appliance as an alternative to CPAP, to treat the sleep apnea.

## 2021-11-06 NOTE — Assessment & Plan Note (Signed)
We are going to try full dose Lunesta since she has some left. Future options might include zaleplon or an orexin receptor antagonist like Dayvigo or Belsomra. Plan- Melatonin 1-2 hours before bed. Using available pills, try Lunesta 3 mg at bedtime.

## 2021-11-06 NOTE — Progress Notes (Signed)
10/30/2021  Joanne Napoleon, NP Patient presents today as self-referral for sleep consult due to insomnia. She was diagnosed with sleep apnea by home test in July 2023 by Dr. Michela Pitcher, AHI 22/hour. She tried wearing CPAP for 2-3 weeks but was intolerant.  Her husband states that it did not help her fall asleep and machine was a distraction and causes more problems for her. Her biggest issues is trouble fallings asleep. She has tried trazodone, temazepam '15mg'$  and Lunesta '1mg'$ . Joanne Jackson does was increased to '2mg'$  but they are awaiting for insurance approval. All medications helped for a couple of days and then stopped workings. She never had CPAP titration study in lab. Not clear if mask or pressure issues. She is currently on chemotherapy. Before she had brain surgery she did not have insomnia.  Sleep apnea: - Recommend you get a wedge pillow to elevate your head while sleeping at night - Consider getting an oral device for treatment of your sleep apnea.  We will refer you to an orthodontic dentist who can do this. Insomnia: - [facility] that [facility] their number is [phone].  Don't accept her insurance. - Increase Lunesta to 2 mg to take at bedtime for insomnia Follow-up - 4 to 6 weeks with any of our sleep doctors (Dr. Annamaria Boots, olalere, alva or sood) for insomnia and OSA/  if nothing open can see [name]  ------------------------------------------------------------------------------------------- 11/05/21- 70 yoF for help with OSA, Insomnia. 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 She has used CPAP machine for 2 weeks previously but it didn't help Body weight today-149 lbs Covid vax- 3 Phizer Flu vax- declines Now on Chemo/ XRT. She reports difficulty initiating sleep as detailed above. Transient help from meds tried. No  sleep at all in last 24 hours. Discussed effect of emotional strain associated with diagnosis and treatment, also possible physical impact of her treatment on brain function, although treatment would not have directly impacted brainstem. Our goal needs to be to help restore restful sleep without increasing difficulty of protective waking during apnea events. She has not been called yet about dental referral to consider oral appliance as an alternative option for sleep apnea.  Prior to Admission medications   Medication Sig Start Date End Date Taking? Authorizing Provider  Cholecalciferol (VITAMIN D3) 50 MCG (2000 UT) TABS Take 2,000 Units by mouth daily as needed.    Yes [provider]  docusate sodium (COLACE) 100 MG capsule Take 1 capsule (100 mg total) by mouth 2 (two) times daily. 10/19/21  Yes Vaslow, Acey Lav, MD  eszopiclone (LUNESTA) 2 MG TABS tablet Take 1 tablet (2 mg total) by mouth at bedtime as needed for sleep. Take immediately before bedtime 10/30/21  Yes Martyn Ehrich, NP  ferrous sulfate 324 (65 Fe) MG TBEC TAKE 1 TABLET BY MOUTH EVERY DAY Patient taking differently: Take 324 mg by mouth daily. 09/06/20  Yes Leamon Arnt, MD  levETIRAcetam (KEPPRA) 500 MG tablet Take 1 tablet (500 mg total) by mouth 2 (two) times daily. 08/17/21  Yes Vaslow, Acey Lav, MD  lisinopril (ZESTRIL) 10 MG tablet Take 1 tablet (10 mg total) by mouth daily. 09/25/21  Yes Leamon Arnt, MD  metoCLOPramide (REGLAN) 10 MG tablet Take 1 tablet (10 mg total) by mouth every 8 (eight) hours as needed for up to 12 doses  for nausea. 10/04/21  Yes Fransico Meadow, MD  omeprazole (PRILOSEC) 20 MG capsule Take 20 mg by mouth daily. 09/11/21  Yes [provider]  ondansetron (ZOFRAN) 8 MG tablet Take 1 tablet (8 mg total) by mouth 2 (two) times daily as needed (nausea and vomiting). May take 30-60 minutes prior to Temodar administration if nausea/vomiting occurs. 10/19/21  Yes Vaslow, Acey Lav, MD   RESTASIS 0.05 % ophthalmic emulsion Place 1 drop into both eyes 2 (two) times daily. 01/25/20  Yes [provider]  temozolomide (TEMODAR) 140 MG capsule Take 1 capsule (140 mg total) by mouth daily. May take on an empty stomach to decrease nausea & vomiting. 09/25/21  Yes Vaslow, Acey Lav, MD  temozolomide (TEMODAR) 140 MG capsule Take 2 capsules (280 mg total) by mouth daily. May take on an empty stomach to decrease nausea & vomiting. 10/19/21  Yes Vaslow, Acey Lav, MD  temozolomide (TEMODAR) 180 MG capsule Take 1 capsule (180 mg total) by mouth daily. May take on an empty stomach to decrease nausea & vomiting. 09/25/21  Yes Vaslow, Acey Lav, MD  temozolomide (TEMODAR) 20 MG capsule Take 1 capsule (20 mg total) by mouth daily. May take on an empty stomach to decrease nausea & vomiting. 09/25/21  Yes Vaslow, Acey Lav, MD  traZODone (DESYREL) 100 MG tablet Take 100 mg by mouth at bedtime. 10/18/21  Yes [provider]   Past Medical History:  Diagnosis Date   Brain cancer ([place])    Glioma ([place]) 05/18/2021   Hypertension    Pre-diabetes    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[facility])    Past Surgical History:  Procedure Laterality Date   BRAIN SURGERY  05/2021   tumor removal   CESAREAN SECTION     DILATION AND CURETTAGE, DIAGNOSTIC / THERAPEUTIC     .fmh Social History   Socioeconomic History   Marital status: Married    Spouse name: Not on file   Number of children: 1   Years of education: Not on file   Highest education level: Not on file  Occupational History   Occupation: Accounting  Tobacco Use   Smoking status: Never    Passive exposure: Never   Smokeless tobacco: Never  Vaping Use   Vaping Use: Never used  Substance and Sexual Activity   Alcohol use: Not Currently   Drug use: Never   Sexual activity: Yes    Partners: Male    Birth control/protection: Other-see comments, Condom    Comment: husband is infertile  Other Topics Concern    Not on file  Social History Narrative   Not on file   Social Determinants of Health   Financial Resource Strain: Not on file  Food Insecurity: Not on file  Transportation Needs: Not on file  Physical Activity: Not on file  Stress: Not on file  Social Connections: Not on file  Intimate Partner Violence: Not on file   ROS-see HPI   + = positive Constitutional:    weight loss, night sweats, fevers, chills,+ fatigue, lassitude. HEENT:    headaches, difficulty swallowing, tooth/dental problems, sore throat,       sneezing, itching, ear ache, nasal congestion, post nasal drip, snoring CV:    chest pain, orthopnea, PND, swelling in lower extremities, anasarca,                                   dizziness, palpitations Resp:  shortness of breath with exertion or at rest.                productive cough,   non-productive cough, coughing up of blood.              change in color of mucus.  wheezing.   Skin:    rash or lesions. GI:  No-   heartburn, indigestion, abdominal pain, nausea, vomiting, diarrhea,                 change in bowel habits, loss of appetite GU: dysuria, change in color of urine, no urgency or frequency.   flank pain. MS:   joint pain, stiffness, decreased range of motion, back pain. Neuro-     nothing unusual Psych:  change in mood or affect.  depression or +anxiety.   memory loss.  OBJ- Physical Exam General- Alert, Oriented, Affect-appropriate, Distress- none acute Skin- rash-none, lesions- none, excoriation- none Lymphadenopathy- none Head- atraumatic            Eyes- Gross vision intact, PERRLA, conjunctivae and secretions clear            Ears- Hearing, canals-normal            Nose- Clear, no-Septal dev, mucus, polyps, erosion, perforation             Throat- Mallampati III-IV , mucosa clear , drainage- none, tonsils- atrophic Neck- flexible , trachea midline, no stridor , thyroid nl, carotid no bruit Chest - symmetrical excursion , unlabored            Heart/CV- RRR , no murmur , no gallop  , no rub, nl s1 s2                           - JVD- none , edema- none, stasis changes- none, varices- none           Lung- clear to P&A, wheeze- none, cough- none , dullness-none, rub- none           Chest wall-  Abd-  Br/ Gen/ Rectal- Not done, not indicated Extrem- cyanosis- none, clubbing, none, atrophy- none, strength- nl Neuro- grossly intact to observation

## 2021-11-06 NOTE — Assessment & Plan Note (Addendum)
We may want to revisit CPAP in the future, after insomnia component is addressed. Plan- for now, refertot orthodontics for consideration of oral appliance to treat OSA. Elevate head while sleeping. Avoid sleeping flat on back.

## 2021-11-06 NOTE — Assessment & Plan Note (Signed)
Using Biotene now. Suggested trying paper tape across lips to close mouth, or chin strap.

## 2021-11-07 ENCOUNTER — Ambulatory Visit: Payer: 59 | Admitting: Pulmonary Disease

## 2021-11-07 ENCOUNTER — Other Ambulatory Visit: Payer: Self-pay

## 2021-11-07 ENCOUNTER — Ambulatory Visit: Payer: 59 | Attending: Neurological Surgery | Admitting: Physical Therapy

## 2021-11-07 ENCOUNTER — Encounter: Payer: Self-pay | Admitting: Internal Medicine

## 2021-11-07 ENCOUNTER — Encounter: Payer: Self-pay | Admitting: Physical Therapy

## 2021-11-07 DIAGNOSIS — M6281 Muscle weakness (generalized): Secondary | ICD-10-CM | POA: Diagnosis present

## 2021-11-07 DIAGNOSIS — I69354 Hemiplegia and hemiparesis following cerebral infarction affecting left non-dominant side: Secondary | ICD-10-CM | POA: Diagnosis present

## 2021-11-07 DIAGNOSIS — R278 Other lack of coordination: Secondary | ICD-10-CM | POA: Diagnosis present

## 2021-11-07 DIAGNOSIS — R41841 Cognitive communication deficit: Secondary | ICD-10-CM | POA: Diagnosis present

## 2021-11-09 NOTE — Telephone Encounter (Signed)
Dr. Annamaria Boots, please advise on pt's message regarding Melatonin and Lunesta. Thanks.

## 2021-11-10 ENCOUNTER — Ambulatory Visit (INDEPENDENT_AMBULATORY_CARE_PROVIDER_SITE_OTHER): Payer: 59 | Admitting: Behavioral Health

## 2021-11-10 DIAGNOSIS — F4323 Adjustment disorder with mixed anxiety and depressed mood: Secondary | ICD-10-CM | POA: Diagnosis not present

## 2021-11-10 NOTE — Progress Notes (Addendum)
[facility] Initial Adult Exam  Name: Joanne Jackson Date: 11/10/2021 MRN: [id number] DOB: Mar 21, 1975 PCP: Joanne Arnt, MD  Time spent: 60 min Caregility video; Pt is home in private & Provider is remote in Home Office  Guardian/Payee:  Self    Paperwork requested: No   Reason for Visit /Presenting Problem: Pt is unable to achieve good Sleep Hygiene & had first semi-good night of sleep last evening when she used Trazodone '100mg'$  + Melatonin '3mg'$   Mental Status Exam: Appearance:   NA     Behavior:  Appropriate and Sharing  Motor:  NA  Speech/Language:   Clear and Coherent and Normal Rate  Affect:  Appropriate  Mood:  normal  Thought process:  normal  Thought content:    WNL  Sensory/Perceptual disturbances:    WNL  Orientation:  oriented to person, place, and time/date  Attention:  Good  Concentration:  Good  Memory:  WNL  Fund of knowledge:   Good  Insight:    Good  Judgment:   Good  Impulse Control:  Good    Risk Assessment: Danger to Self:  No Self-injurious Behavior: No Danger to Others: No Duty to Warn:no Physical Aggression / Violence:No  Access to Firearms a concern: No  Gang Involvement:No  Patient / guardian was educated about steps to take if suicide or homicide risk level increases between visits: yes While future psychiatric events cannot be accurately predicted, the patient does not currently require acute inpatient psychiatric care and does not currently meet [facility] involuntary commitment criteria.  Substance Abuse History: Current substance abuse: No     Past Psychiatric History:   No previous psychological problems have been observed Outpatient Providers: Dr. Billey Chang, MD History of Psych Hospitalization: No  Psychological Testing:  NA    Abuse History:  Victim of: No.,  NA    Report needed: No. Victim of Neglect:No. Perpetrator of  NA   Witness / Exposure to Domestic Violence: No   Protective Services  Involvement: No  Witness to [employer] Violence:  No   Family History:  Family History  Problem Relation Age of Onset   Thyroid disease Mother    Heart attack Father 84   Hypertension Father    Healthy Brother    Healthy Paternal Grandfather    Healthy Daughter     Living situation: the patient lives with their family  Sexual Orientation: Straight  Relationship Status: married  Name of spouse / other: Joanne Jackson; pronounced "E" If a parent, number of children / ages: 81yo Dtr Joanne Jackson: spouse friends parents  Museum/gallery curator Stress:  No   Income/Employment/Disability: Supported by [employer] and Friends and [employer]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 History of legal issue / charges:  NA  Medical History/Surgical History: reviewed Past Medical History:  Diagnosis Date   Brain cancer ([place])    Glioma ([place]) 05/18/2021   Hypertension    Pre-diabetes    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[place])     Past Surgical  History:  Procedure Laterality Date   BRAIN SURGERY  05/2021   tumor removal   CESAREAN SECTION     DILATION AND CURETTAGE, DIAGNOSTIC / THERAPEUTIC      Medications: Current Outpatient Medications  Medication Sig Dispense Refill   Cholecalciferol (VITAMIN D3) 50 MCG (2000 UT) TABS Take 2,000 Units by mouth daily as needed.      docusate sodium (COLACE) 100 MG capsule Take 1 capsule (100 mg total) by mouth 2 (two)  times daily. 10 capsule 0   ferrous sulfate 324 (65 Fe) MG TBEC TAKE 1 TABLET BY MOUTH EVERY DAY (Patient taking differently: Take 324 mg by mouth daily.) 90 tablet 1   levETIRAcetam (KEPPRA) 500 MG tablet TAKE 1 TABLET BY MOUTH TWICE A DAY 180 tablet 3   lisinopril (ZESTRIL) 10 MG tablet Take 1 tablet (10 mg total) by mouth daily. 90 tablet 3   metoCLOPramide (REGLAN) 10 MG tablet Take 1 tablet (10 mg total) by mouth every 8 (eight) hours as needed for up to 12 doses for nausea. 12 tablet 0   omeprazole (PRILOSEC) 20 MG capsule Take 20 mg by mouth daily.     ondansetron (ZOFRAN) 8 MG tablet Take 1 tablet (8 mg total) by mouth 2 (two) times daily as needed (nausea and vomiting). May take 30-60 minutes prior to Temodar administration if nausea/vomiting occurs. 30 tablet 1   RESTASIS 0.05 % ophthalmic emulsion Place 1 drop into both eyes 2 (two) times daily.     Suvorexant (BELSOMRA) 20 MG TABS 1 at bedtime for sleep 30 tablet 1   temozolomide (TEMODAR) 140 MG capsule Take 1 capsule (140 mg total) by mouth daily. May take on an empty stomach to decrease nausea & vomiting. 5 capsule 0   temozolomide (TEMODAR) 140 MG capsule Take 2 capsules (280 mg total) by mouth daily. May take on an empty stomach to decrease nausea & vomiting. 10 capsule 0   temozolomide (TEMODAR) 180 MG capsule Take 1 capsule (180 mg total) by mouth daily. May take on an empty stomach to decrease nausea & vomiting. 5 capsule 0   temozolomide (TEMODAR) 20 MG capsule Take 1 capsule (20 mg total) by mouth daily. May take on an empty stomach to decrease nausea & vomiting. 5 capsule 0   traZODone (DESYREL) 100 MG tablet Take 100 mg by mouth at bedtime.     No current facility-administered medications for this visit.    No Known Allergies  Diagnoses:  Adjustment disorder with mixed anxiety and depressed mood  Plan of Care: Joanne Jackson she is having difficulty sleeping & needs to rest. She is battling an astrocytoma brain tumor & deals  w/Sjogren's Synd. She has been forced to quit her [employer] job @ [employer] & has not worked in 2 mos. Pt needs a method for attaining sleep. Pt will use the [facility] Manual for psychoedu & will maintain the Sleep Log until our next visit for feedback.  Target Date: 12/06/2021  Progress: 0  Frequency: Twice monthly  Modality: Boykin Reaper, LMFT

## 2021-11-10 NOTE — Progress Notes (Signed)
                Joanne Jackson Joanne Isha Seefeld, LMFT 

## 2021-11-11 ENCOUNTER — Other Ambulatory Visit: Payer: Self-pay | Admitting: Internal Medicine

## 2021-11-12 ENCOUNTER — Encounter: Payer: Self-pay | Admitting: Internal Medicine

## 2021-11-13 MED ORDER — BELSOMRA 20 MG PO TABS: ORAL_TABLET | ORAL | 1 refills | Status: DC

## 2021-11-13 NOTE — Therapy (Signed)
OUTPATIENT PHYSICAL THERAPY NEURO TREATMENT   Patient Name: Joanne Jackson MRN: [id number] DOB:1975/05/19, 46 y.o., female Today's Date: 11/13/2021   PCP: Leamon Arnt, MD REFERRING PROVIDER: Ancil Boozer, FNP      Past Medical History:  Diagnosis Date   Brain cancer [facility])    Glioma ([place]) 05/18/2021   Hypertension    Pre-diabetes    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[place])    Past Surgical History:  Procedure Laterality Date   BRAIN SURGERY  05/2021   tumor removal   CESAREAN SECTION     DILATION AND CURETTAGE, DIAGNOSTIC / THERAPEUTIC     Patient Active Problem List   Diagnosis Date Noted   Moderate obstructive sleep apnea 10/31/2021   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   Dyslipidemia 04/30/2021   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  Rationale for Evaluation and Treatment Rehabilitation  SUBJECTIVE:                                                                                                                                                                                              SUBJECTIVE STATEMENT: Patient reports that she is done with radiation but is now on oral chemo meds until next July. Reports constipation from these meds. Reports that her oncologist at [facility] recommended PT since her L side is not strong enough. Runs outside for 15-20 minutes every day. Denies problems with walking or balance, does note fatigue d/t  difficulty sleeping which is being addressed by meds.  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 No  PRECAUTIONS: None  PATIENT GOALS improve strength in L side   OBJECTIVE:    TODAY'S TREATMENT: 11/14/21 Activity Comments                       Below measures were taken at time of initial evaluation unless otherwise specified:   DIAGNOSTIC FINDINGS: 04/29/21 brain MRI: No contrast enhancement at the site of signal abnormality in the right frontal lobe. This remains highly concerning for tumor, most likely a  low-grade glioma  COGNITION: Overall cognitive status: Within functional limits for tasks assessed   SENSATION: WFL  COORDINATION: Alternating pronation/supination: WNL Alternating toe tap: WNL Finger to nose: WNL    POSTURE: No Significant postural limitations  LOWER EXTREMITY ROM:     Active  Right Eval Left Eval  Hip flexion    Hip extension    Hip abduction    Hip adduction    Hip internal rotation    Hip external rotation    Knee flexion    Knee extension    Ankle dorsiflexion 18 14  Ankle plantarflexion    Ankle inversion    Ankle eversion     (Blank rows = not tested)  LOWER EXTREMITY MMT:    MMT (in sitting) Right Eval Left Eval  Hip flexion 4+ 4  Hip extension    Hip abduction 4+ 3+  Hip adduction 4- 4-  Hip internal rotation    Hip external rotation    Knee flexion 4+ 4-  Knee extension 4+ 4-  Ankle dorsiflexion 4+ 4+  Ankle plantarflexion 4; 20 reps with compensations 4; 20 reps with compensations  Ankle inversion    Ankle eversion    (Blank rows = not tested)  GAIT: Gait pattern: WFL Assistive device utilized: None Level of assistance: Complete Independence   FUNCTIONAL TESTs:  5 times sit to stand: 9.73 sec Functional gait assessment: 29      PATIENT EDUCATION: Education details: prognosis, POC,  HEP Person educated: Patient Education method: Explanation, Demonstration, Tactile cues, Verbal cues, and Handouts Education comprehension: verbalized understanding and returned demonstration   HOME EXERCISE PROGRAM: Access Code: [email or web address] URL: [email or web address] Date: 11/07/2021 Prepared by: [facility]  Exercises - Marching with Resistance  - 1 x daily - 5 x weekly - 2 sets - 10 reps - Standing Hamstring Curl with Resistance  - 1 x daily - 5 x weekly - 2 sets - 10 reps - Mini Squat with Counter Support  - 1 x daily - 5 x weekly - 2 sets - 10 reps - Sidelying Hip Abduction  - 1 x daily - 5 x weekly - 2 sets - 10 reps - Supine Bridge with Mini Swiss Ball Between Knees  - 1 x daily - 5 x weekly - 2 sets - 10 reps    GOALS: Goals reviewed with patient? Yes  SHORT TERM GOALS: Target date: 11/28/2021  Patient to be independent with initial HEP. Baseline: HEP initiated Goal status: IN PROGRESS    LONG TERM GOALS: Target date: 12/19/2021  Patient to be independent with advanced HEP. Baseline: Not yet initiated  Goal status: IN PROGRESS  Patient to demonstrate B LE strength >/=4/5.  Baseline: See above Goal status: IN PROGRESS  Patient to demonstrate good running form and stability on ground or treadmill.  Baseline: NT Goal status: IN PROGRESS    ASSESSMENT:  CLINICAL IMPRESSION:  Patient is a 46 y/o F presenting to OPPT with c/o L sided weakness s/p  R frontal glioma s/p resection in 05/2021. Denies difficulty with gait or balance, but reports fatigue from trouble sleeping. Patient runs 15-20 minutes a day for exercise. Patient today presenting with weakness in L hip flexor, abductor, B adductors, L HS, L quad, and B PFs. Balance assessment today indicates a decreased risk of falls. Patient was educated on gentle strengthening HEP and reported understanding. Prior to current episode, patient was independent. Would  benefit from skilled PT services 1  x/week for 6 weeks to address aforementioned impairments in order to optimize level of function.    OBJECTIVE IMPAIRMENTS decreased strength.   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   REHAB POTENTIAL: Good  CLINICAL DECISION MAKING: Evolving/moderate complexity  EVALUATION COMPLEXITY: Moderate  PLAN: PT FREQUENCY: 1x/week  PT DURATION: 6 weeks  PLANNED INTERVENTIONS: Therapeutic exercises, Therapeutic activity, Neuromuscular re-education, Balance training, Gait training, Patient/Family education, Self Care, Joint mobilization, Joint manipulation, Stair training, Vestibular training, Canalith repositioning, Visual/preceptual remediation/compensation, Aquatic Therapy, Dry Needling, Electrical stimulation, Cryotherapy, Moist heat, Taping, Manual therapy, and Re-evaluation  PLAN FOR NEXT SESSION: reassess HEP and progress L LE strengthening; observe running form   Janene Harvey, PT, DPT 11/13/21 4:56 PM  [facility] [address], [place], [place] [address] Phone # [phone] Fax # [phone]

## 2021-11-13 NOTE — Telephone Encounter (Signed)
Stop lunesta since it didn't work. Bitter taste is a common side effect of that drug and should go away soon. I have sent a prescription to try Belsomra to [facility] on [address]. Give it a few days to start working well.

## 2021-11-14 ENCOUNTER — Ambulatory Visit: Payer: 59 | Admitting: Physical Therapy

## 2021-11-14 ENCOUNTER — Encounter: Payer: Self-pay | Admitting: Physical Therapy

## 2021-11-14 ENCOUNTER — Ambulatory Visit (INDEPENDENT_AMBULATORY_CARE_PROVIDER_SITE_OTHER): Payer: 59 | Admitting: Family Medicine

## 2021-11-14 ENCOUNTER — Encounter: Payer: Self-pay | Admitting: Family Medicine

## 2021-11-14 VITALS — BP 130/70 | HR 64 | Temp 98.3°F | Ht 59.0 in | Wt 149.8 lb

## 2021-11-14 DIAGNOSIS — C719 Malignant neoplasm of brain, unspecified: Secondary | ICD-10-CM

## 2021-11-14 DIAGNOSIS — M6281 Muscle weakness (generalized): Secondary | ICD-10-CM | POA: Diagnosis not present

## 2021-11-14 DIAGNOSIS — I69354 Hemiplegia and hemiparesis following cerebral infarction affecting left non-dominant side: Secondary | ICD-10-CM

## 2021-11-14 DIAGNOSIS — Z1231 Encounter for screening mammogram for malignant neoplasm of breast: Secondary | ICD-10-CM

## 2021-11-14 DIAGNOSIS — G4709 Other insomnia: Secondary | ICD-10-CM | POA: Diagnosis not present

## 2021-11-14 DIAGNOSIS — I10 Essential (primary) hypertension: Secondary | ICD-10-CM | POA: Diagnosis not present

## 2021-11-14 DIAGNOSIS — Z23 Encounter for immunization: Secondary | ICD-10-CM | POA: Diagnosis not present

## 2021-11-14 NOTE — Patient Instructions (Signed)
Please return in 6 months to recheck blood pressure.   Please continue the lisinopril '10mg'$  daily. Do not increase the dose.  Continue the sleep medication nightly.   If you have any questions or concerns, please don't hesitate to send me a message via MyChart or call the office at [phone]. Thank you for visiting with [place] today! It's our pleasure caring for you.

## 2021-11-14 NOTE — Progress Notes (Signed)
Subjective  CC:  Chief Complaint  Patient presents with   Hypertension    Pt here to F/U with BP     HPI: Joanne Jackson is a 46 y.o. female who presents to the office today to address the problems listed above in the chief complaint. Hypertension f/u: Control is good . Pt reports she is doing well. taking medications as instructed, no medication side effects noted, no TIAs, no chest pain on exertion, no dyspnea on exertion, no swelling of ankles. However home readings will at times have elevated diastolics. Compared to our reading today, her cuff shows diastolic 44-01. Does not correlate well. Readings are normal in doctor's office on lisinopril 10. Reviewed recent cmp; nl K and renal function She denies adverse effects from his BP medications. Compliance with medication is good.  Insomnia: doing better on trazadone '100mg'$  nightly. Occ has a restless night and will feel off the next day. No AEs  Assessment  1. Essential hypertension   2. Need for immunization against influenza   3. Secondary insomnia      Plan   Hypertension f/u: BP control is well controlled. Stop home readings due to abnl cuff. Continue lisinopril 10 Insomnia; improved. Continue trazadone 100 nighlty.  Flu shot today.  Has oncology f/u next week.   Education regarding management of these chronic disease states was given. Management strategies discussed on successive visits include dietary and exercise recommendations, goals of achieving and maintaining IBW, and lifestyle modifications aiming for adequate sleep and minimizing stressors.   Follow up: 6 mo for bp.  Orders Placed This Encounter  Procedures   Flu Vaccine QUAD 93moIM (Fluarix, Fluzone & Alfiuria Quad PF)   No orders of the defined types were placed in this encounter.     BP Readings from Last 3 Encounters:  11/14/21 130/70  11/06/21 118/70  10/30/21 110/82   Wt Readings from Last 3 Encounters:  11/14/21 149 lb 12.8 oz (67.9 kg)  11/06/21 149  lb 12.8 oz (67.9 kg)  10/30/21 149 lb 9.6 oz (67.9 kg)    Lab Results  Component Value Date   CHOL 173 08/21/2021   CHOL 167 10/25/2020   CHOL 158 12/02/2019   Lab Results  Component Value Date   HDL 45.50 08/21/2021   HDL 47.70 10/25/2020   HDL 50 12/02/2019   Lab Results  Component Value Date   LDLCALC 98 08/21/2021   LDLCALC 82 10/25/2020   LDLCALC 85 12/02/2019   Lab Results  Component Value Date   TRIG 149.0 08/21/2021   TRIG 187.0 (H) 10/25/2020   TRIG 131 12/02/2019   Lab Results  Component Value Date   CHOLHDL 4 08/21/2021   CHOLHDL 3 10/25/2020   CHOLHDL 3.2 12/02/2019   Lab Results  Component Value Date   LDLDIRECT 86.0 05/21/2019   Lab Results  Component Value Date   CREATININE 0.52 10/19/2021   BUN 15 10/19/2021   NA 137 10/19/2021   K 4.0 10/19/2021   CL 106 10/19/2021   CO2 26 10/19/2021    The 10-year ASCVD risk score (Arnett DK, et al., 2019) is: 1.1%   Values used to calculate the score:     Age: [age]years     Sex: Female     Is Non-Hispanic African American: No     Diabetic: No     Tobacco smoker: No     Systolic Blood Pressure: 1027mmHg     Is BP treated: Yes  HDL Cholesterol: 45.5 mg/dL     Total Cholesterol: 173 mg/dL  I reviewed the patients updated PMH, FH, and SocHx.    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  Social History: Patient  reports that she has never smoked. She has never been exposed to tobacco smoke. She has never used smokeless tobacco. She reports that she does not currently use alcohol. She reports that she does not use drugs.  Current Meds  Medication Sig   Cholecalciferol (VITAMIN D3) 50 MCG (2000 UT) TABS Take 2,000 Units by mouth daily as needed.    docusate sodium (COLACE) 100 MG capsule Take 1 capsule (100 mg total) by mouth 2 (two) times daily.   ferrous sulfate 324 (65 Fe) MG TBEC TAKE 1 TABLET BY MOUTH EVERY DAY (Patient taking differently: Take 324 mg by mouth daily.)   levETIRAcetam (KEPPRA) 500 MG tablet TAKE 1 TABLET BY MOUTH TWICE A DAY   lisinopril (ZESTRIL) 10 MG tablet Take 1 tablet (10 mg total) by mouth daily.   metoCLOPramide (REGLAN) 10 MG tablet Take 1 tablet (10 mg total) by mouth every 8 (eight) hours as needed for up to 12 doses for nausea.   omeprazole (PRILOSEC) 20 MG capsule Take 20 mg by mouth daily.   ondansetron (ZOFRAN) 8 MG tablet Take 1 tablet (8 mg total) by mouth 2 (two) times daily as needed (nausea and vomiting). May take 30-60 minutes prior to Temodar administration if nausea/vomiting occurs.   RESTASIS 0.05 % ophthalmic emulsion Place 1 drop into both eyes 2 (two) times daily.   Suvorexant (BELSOMRA) 20 MG TABS 1 at bedtime for sleep   temozolomide (TEMODAR) 140 MG capsule Take 1 capsule (140 mg total) by mouth daily. May take on an empty stomach to decrease nausea & vomiting.   temozolomide (TEMODAR) 140 MG capsule Take 2 capsules (280 mg total) by mouth daily. May take on an empty stomach to decrease nausea & vomiting.   temozolomide (TEMODAR) 180 MG capsule Take 1 capsule (180 mg total) by mouth daily. May take on an empty stomach to decrease nausea & vomiting.   temozolomide (TEMODAR) 20 MG capsule Take 1 capsule (20 mg total) by mouth daily.  May take on an empty stomach to decrease nausea & vomiting.   traZODone (DESYREL) 100 MG tablet Take 100 mg by mouth at bedtime.    Review of Systems: Cardiovascular: negative for chest pain, palpitations, leg swelling, orthopnea Respiratory: negative for SOB, wheezing or persistent cough Gastrointestinal: negative for abdominal pain Genitourinary: negative for dysuria or gross hematuria  Objective  Vitals: BP 130/70   Pulse 64   Temp 98.3 F (36.8 C)   Ht '4\' 11"'$  (1.499 m)   Wt 149 lb 12.8 oz (67.9  kg)   LMP 09/30/2021 (Exact Date)   SpO2 99%   BMI 30.26 kg/m  General: no acute distress  Psych:  Alert and oriented, normal mood and affect HEENT:  Normocephalic, atraumatic, supple neck  Cardiovascular:  RRR without murmur. no edema Respiratory:  Good breath sounds bilaterally, CTAB with normal respiratory effort Neurologic:   Mental status is normal Commons side effects, risks, benefits, and alternatives for medications and treatment plan prescribed today were discussed, and the patient expressed understanding of the given instructions. Patient is instructed to call or message via MyChart if he/she has any questions or concerns regarding our treatment plan. No barriers to understanding were identified. We discussed Red Flag symptoms and signs in detail. Patient expressed understanding regarding what to do in case of urgent or emergency type symptoms.  Medication list was reconciled, printed and provided to the patient in AVS. Patient instructions and summary information was reviewed with the patient as documented in the AVS. This note was prepared with assistance of Dragon voice recognition software. Occasional wrong-word or sound-a-like substitutions may have occurred due to the inherent limitation

## 2021-11-16 ENCOUNTER — Ambulatory Visit: Payer: 59

## 2021-11-16 ENCOUNTER — Inpatient Hospital Stay: Payer: 59 | Attending: Radiation Oncology

## 2021-11-16 ENCOUNTER — Other Ambulatory Visit: Payer: Self-pay | Admitting: Internal Medicine

## 2021-11-16 ENCOUNTER — Inpatient Hospital Stay (HOSPITAL_BASED_OUTPATIENT_CLINIC_OR_DEPARTMENT_OTHER): Payer: 59 | Admitting: Internal Medicine

## 2021-11-16 VITALS — BP 113/74 | HR 55 | Temp 97.9°F | Resp 17 | Ht 59.0 in | Wt 149.2 lb

## 2021-11-16 DIAGNOSIS — Z8349 Family history of other endocrine, nutritional and metabolic diseases: Secondary | ICD-10-CM | POA: Insufficient documentation

## 2021-11-16 DIAGNOSIS — R41841 Cognitive communication deficit: Secondary | ICD-10-CM

## 2021-11-16 DIAGNOSIS — Z79899 Other long term (current) drug therapy: Secondary | ICD-10-CM | POA: Insufficient documentation

## 2021-11-16 DIAGNOSIS — K5900 Constipation, unspecified: Secondary | ICD-10-CM | POA: Insufficient documentation

## 2021-11-16 DIAGNOSIS — C719 Malignant neoplasm of brain, unspecified: Secondary | ICD-10-CM

## 2021-11-16 DIAGNOSIS — C711 Malignant neoplasm of frontal lobe: Secondary | ICD-10-CM | POA: Insufficient documentation

## 2021-11-16 DIAGNOSIS — M6281 Muscle weakness (generalized): Secondary | ICD-10-CM | POA: Diagnosis not present

## 2021-11-16 DIAGNOSIS — M3500 Sicca syndrome, unspecified: Secondary | ICD-10-CM | POA: Diagnosis not present

## 2021-11-16 DIAGNOSIS — Z79630 Long term (current) use of alkylating agent: Secondary | ICD-10-CM | POA: Insufficient documentation

## 2021-11-16 DIAGNOSIS — G4733 Obstructive sleep apnea (adult) (pediatric): Secondary | ICD-10-CM | POA: Insufficient documentation

## 2021-11-16 DIAGNOSIS — R112 Nausea with vomiting, unspecified: Secondary | ICD-10-CM | POA: Insufficient documentation

## 2021-11-16 DIAGNOSIS — Z8249 Family history of ischemic heart disease and other diseases of the circulatory system: Secondary | ICD-10-CM | POA: Insufficient documentation

## 2021-11-16 DIAGNOSIS — R251 Tremor, unspecified: Secondary | ICD-10-CM | POA: Diagnosis not present

## 2021-11-16 DIAGNOSIS — I10 Essential (primary) hypertension: Secondary | ICD-10-CM | POA: Diagnosis not present

## 2021-11-16 LAB — CMP (CANCER CENTER ONLY)
ALT: 12 U/L (ref 0–44)
AST: 12 U/L — ABNORMAL LOW (ref 15–41)
Albumin: 4.2 g/dL (ref 3.5–5.0)
Alkaline Phosphatase: 55 U/L (ref 38–126)
Anion gap: 4 — ABNORMAL LOW (ref 5–15)
BUN: 15 mg/dL (ref 6–20)
CO2: 29 mmol/L (ref 22–32)
Calcium: 9.3 mg/dL (ref 8.9–10.3)
Chloride: 103 mmol/L (ref 98–111)
Creatinine: 0.54 mg/dL (ref 0.44–1.00)
GFR, Estimated: >60 mL/min (ref >60)
Glucose, Bld: 110 mg/dL — ABNORMAL HIGH (ref 70–99)
Potassium: 4 mmol/L (ref 3.5–5.1)
Sodium: 136 mmol/L (ref 135–145)
Total Bilirubin: 0.8 mg/dL (ref 0.3–1.2)
Total Protein: 7.2 g/dL (ref 6.5–8.1)

## 2021-11-16 LAB — CBC WITH DIFFERENTIAL (CANCER CENTER ONLY)
Abs Immature Granulocytes: 0.02 10*3/uL (ref 0.00–0.07)
Basophils Absolute: 0 10*3/uL (ref 0.0–0.1)
Basophils Relative: 0 %
Eosinophils Absolute: 0.1 10*3/uL (ref 0.0–0.5)
Eosinophils Relative: 2 %
HCT: 38.5 % (ref 36.0–46.0)
Hemoglobin: 13.5 g/dL (ref 12.0–15.0)
Immature Granulocytes: 1 %
Lymphocytes Relative: 12 %
Lymphs Abs: 0.4 10*3/uL — ABNORMAL LOW (ref 0.7–4.0)
MCH: 30.8 pg (ref 26.0–34.0)
MCHC: 35.1 g/dL (ref 30.0–36.0)
MCV: 87.7 fL (ref 80.0–100.0)
Monocytes Absolute: 0.3 10*3/uL (ref 0.1–1.0)
Monocytes Relative: 8 %
Neutro Abs: 2.8 10*3/uL (ref 1.7–7.7)
Neutrophils Relative %: 77 %
Platelet Count: 189 10*3/uL (ref 150–400)
RBC: 4.39 MIL/uL (ref 3.87–5.11)
RDW: 12.2 % (ref 11.5–15.5)
WBC Count: 3.7 10*3/uL — ABNORMAL LOW (ref 4.0–10.5)
nRBC: 0 % (ref 0.0–0.2)

## 2021-11-16 MED ORDER — TEMOZOLOMIDE 140 MG PO CAPS: 150.0000 mg/m2/d | ORAL_CAPSULE | Freq: Every day | ORAL | 0 refills | Status: DC

## 2021-11-16 NOTE — Therapy (Signed)
OUTPATIENT SPEECH LANGUAGE PATHOLOGY TREATMENT SESSION   Patient Name: Joanne Jackson MRN: [id number] DOB:1975-12-12, 46 y.o., female Today's Date: 10/13/2021  PCP: Leamon Arnt., MD REFERRING PROVIDER: Kathyrn Drown, MD    End of Session - 11/16/21 1534     Visit Number 9    Number of Visits 25    Date for SLP Re-Evaluation 01/05/22    Authorization - Number of Visits 70    SLP Start Time 1450    SLP Stop Time  4098    SLP Time Calculation (min) 40 min    Activity Tolerance Patient tolerated treatment well                    Past Medical History:  Diagnosis Date   Brain cancer ([place])    Glioma ([place]) 05/18/2021   Hypertension    Pre-diabetes    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[place])    Past Surgical History:  Procedure Laterality Date   BRAIN SURGERY  05/2021   tumor removal   CESAREAN SECTION     DILATION AND CURETTAGE, DIAGNOSTIC / THERAPEUTIC     Patient Active Problem List   Diagnosis Date Noted   Moderate obstructive sleep apnea 10/31/2021   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   Dyslipidemia 04/30/2021   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[place]) 04/30/2021   Episode of transient neurologic symptoms 04/28/2021   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[place]) 10/25/2020   History of gestational diabetes 10/25/2020   Iron deficiency anemia 12/03/2019   Tubular adenoma of colon 10/24/2018   Essential hypertension 12/26/2017   Prediabetes 12/26/2017   Intramural leiomyoma of uterus 07/29/2015    ONSET DATE: 05/18/21   REFERRING DIAG: D49.6 (ICD-10-CM) - Neoplasm of unspecified behavior of brain   THERAPY DIAG:  Cognitive communication deficit  Rationale for Evaluation and Treatment Rehabilitation  SUBJECTIVE:   SUBJECTIVE STATEMENT: Joanne Jackson reports she got 20 minutes sleep last night; Has gotten a little over one hour of sleep this week. Pt accompanied by: self  PERTINENT HISTORY: Patient  presented to medical attention in March 2023 with new onset seizures, characterized by shaking of the left hand without impaired awareness. CNS imaging demonstrated non enhancing mass within right frontal lobe, c/w primary neoplasm. She underwent craniotomy, resection on 05/18/21 with Dr. 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 Since surgery, husband has noticed more blunted (depressed?) mood, less responsive and engaging. Otherwise she is fully independent and functional. Referred to [facility] from [facility] for local implementation of radiation and chemotherapy. Last day of IMRT and Temodar yesterday (07-31-21). Pt had HHST upon d/c from [facility].  PAIN:  Are you having pain? No  PATIENT GOALS  None stated today when SLP asked, due to decr'd intellectual awareness.   OBJECTIVE:  TODAY'S TREATMENT:  11/16/21: Pt returned today because sleep a little better regulated as 3-4 hours/night. Pt will cont to undergo her chemo cycle until middle of next year and then plan will change. She agrees with SLP that the further she progresses away from last day of chemo, then gets worse when chemo cycle begins again. Pt is compensating with appropriate compensations for memory, continues to make errors rarely - pt gave example of email details, but always cc:'s husband to have second set of eyes in order to correct her if necessary. She is paying some bills with double checking/supervision from E, she is using alarms (for meds) and timers, and is taking brain breaks when necessary for as  long as she can up to 20 minutes. Pt agreed with SLP that frequency could be x1 every other week with d/c in 2-3 visits, depending on pt status. SLP provided pt with some homework for detailed work and error awareness.   10/13/21: Pt returned PROM today; results below in "PROM". SLP and pt discussed whether or not pt should cont with ST in the next 2-3 weeks given so little sleep the last 7-8 days. Pt reports being very tired today and  SLP believes this will impede pt's performance today and her progress in the future until her sleep gets better regulated. Pt agreed to cx September appointments to allow her sleep to get regulated better - a sleep study is currently undergoing a long approval process from pt's insurance, pt reports.  Today, SLP assisted pt in detailed functional written language tasks in which pt req'd min-mod cues occasionally for error awareness and for alternating attention.   10/05/21: Pt brought in written homework with many erase marks. "These were hard," pt stated. SLP gave pt "code" homework task which req'd her to alternate attention between response and stimuli. Pt stated she, eventually, used a straight edge as a guide and this was less difficult than no guide. Today she alternated attention WFL/WNL between detailed written tasks with cues for attention to detail. When she double-checked her answers she found all but one of her mistakes. SLP had door open for mod noise and pt req'd cue for "brain break" but she did not take one until SLP cued a second time; "I was still working," pt stated. She took 3 minute break and went back to tasks. SLP provided pt and husband PROM for cognition and asked pt return these next session. Joanne Jackson, at end of session today indicated she needs to use compensations for deficits in memory, for attention, and for mental fatigue.  09/26/21: Pt reports she will begin Temodar again tonight. Has possible sleep study tomorrow night. Homework completed in full, 100% success. Pt reported using sticky notes successfully for recall of turning lights off and bringing her glasses downstairs. Today SLP used practical written math problems to target attending to details. Pt req'd consistent min A to note errors; Did not check work immediately (problem after problem), and pt had 3/6 incorrect after her double checking. Pt noted to move very quickly with math and make errors due to decr'd alternating  attention writing down all figures needed for accuracy. SLP encouraged pt to follow with her finger to incr accuracy, and to cross off figures when she has written them down to aid in alternating attention.  09/19/21: Pt entered with homework completed. Joanne Jackson told SLP she was able to make brain breaks work with her daughter accompanying her with a timer and muted iPad. In detailed tasks pt today did not incorporate a strategy for written language comprehension so she made errors (three). She fixed two, but one took SLP verbal cue for written organization. Homework for written detailed task.  09/12/21: E expressed pt having difficulty with processing spoken information. SLP believes something to do with pt's attention level as well as with possible decr in auditory/overall processing. Pt brought her homework - evidence pt completed without double-checking her answers, and did not slow down and look at the task prior to completing; Errors were due to incomplete procedures (pt did not look fully at the information provided and wrote down the first item she saw). Pt has not been using "brain breaks" when her daughter is present  because her daughter follows her wherever she goes. SLP provided some suggestions in "pt instructions" for pt and husband to try at home. SLP provided homework for auditory attention/processing for husband to complete with pt.  09/06/21: Pt showed SLP her written homework. On her corrected copy she still had errors. SLP assisted pt in organizing her responses and saw approx 9 minutes into task that pt stopped completely. Pt endorsed mental fatigue so SLP asked pt to take a brain break for 3 minutes. Pt returned to task with better sustained/selective attention for remaining 6 minutes of task. SLP then educated and instructed pt on the importance of and examples of brain breaks when completing focused tasks. SLP asked pt what situations she has found herself that her brain needs rest from focusing  and she said TV shows. SLP suggested stopping/pausing TV and doing mini-meditation session for 3-5 minutes and returning to task after brain break. SLP shared details of this session with pt's husband (with pt's permission).  08/29/21: CLQTwas completed today The Cognitive Linguistic Quick Test (CLQT) was administered to assess the relative status of five cognitive domains: attention, memory, language, executive functioning, and visuospatial skills. Scores from 10 tasks were used to estimate severity ratings (standardized for age groups 18-69 years and 70-89 years) for each domain, a clock drawing task, as well as an overall composite severity rating of cognition.      Task Score Criterion Cut Scores  Personal Facts 8/8 8  Symbol Cancellation 12/12 11  Confrontation Naming 10/10 10  Clock Drawing  10/13 12  Story Retelling 8/10 6  Symbol Trails 7/10 9  Generative Naming 4/10 5  Design Memory 6/6 5  Mazes  4/8 7  Design Generation 8/13 6    Cognitive Domain Composite Score Severity Rating  Attention 181/215 WNL (low)  Memory 168/185 WNL  Executive Function 23/40 Mild  Language 30/37 WNL (low)  Visuospatial Skills 82/105 WNL (low)  Clock Drawing  10/13 Mild  Composite Severity Rating 3.8 WNL   Overall composite rating is "WNL" however on CLQT pt demonstrated decr'd skills in awareness, attention, executive function, and a lack of impulse control. Impulsivity decr'd pt's score specifically with clock drawing, mazes, and symbol trails.   (Date of eval): SLP discussed evaluation results thus far, and Constant Therapy was introduced and explained.     PATIENT REPORTED OUTCOME MEASURES (PROM): Cognitive Function: E scored Joanne Jackson 31/40, and Joanne Jackson scored herself 32/40 (higher numbers indicate better QOL)    PATIENT EDUCATION: Education details: See "today's treatment" Person educated: Patient Education method: Explanation Education comprehension: verbalized understanding and needs further  education   GOALS: Goals reviewed with patient? Yes  SHORT TERM GOALS:  Target date: 11/10/2021 (extended due to pt resuming ST in October)  Pt will demo [facility] selective attention with functional tasks in mod noisy environment for 20 minutes in 2 sessions  Baseline: 09/26/21 Goal status: met  2.  Pt will demo WFL alternating attention between billpay task and additional random tasks in 2 sessions  Baseline: 10/05/21 Goal status: on hold  3.  Pt will acknowledge at least two areas of deficit during three therapy sessions  Baseline: 09/19/21, 09-26-21 Goal status: Met  4.  Joanne Jackson will note 80% of errors in therapy tasks when given chance to self correct in 3 sessions Baseline: 09/19/21, 10-05-21 Goal status: on hold until October 2023  5.  Joanne Jackson will reportedly demonstrate less frequent "absent-mindedness" in the home (doors open, lights on, etc) than when compared to  prior to OP ST, as reported by pt or E  Baseline:  Goal status: Met  LONG TERM GOALS: Target date: 10/24/2021    Joanne Jackson will score better on E's and her own Cognitive PROM than during her first therapy session Baseline:  Goal status: Ongoing  2.  Pt will demo functional alternating attention with mod complex tasks in therapy session x3 and will reportedly demo functional alternating attention at home as reported by pt or by E, x3  Baseline: 11-16-21 Goal status: Ongoing  3.  Joanne Jackson will indicate anticipatory awareness during the session or at home as reported by pt or E, x5 Baseline: 11/16/21 Goal status: Ongoing  4.  Pt will exhibit WFL organization and planning by paying bills independently Baseline: 11/16/21 Goal status: Ongoing   ASSESSMENT:  CLINICAL IMPRESSION: Patient is a 46 y.o. female who was seen today for cognitive communication tx. SEE THERAPY NOTE. She returns today after a short break to help get her sleep regulated - now regulated for approx 3-4 hours/night. Joanne Jackson requires improved cognition for responsibilities  at home, and is using compensatory strategies appropriately.    OBJECTIVE IMPAIRMENTS include attention, memory, awareness, and executive functioning. These impairments are limiting patient from return to work, managing medications, managing appointments, managing finances, household responsibilities, and effectively communicating at home and in community. Factors affecting potential to achieve goals and functional outcome are ability to learn/carryover information and co-morbidities.. Patient will benefit from skilled SLP services to address above impairments and improve overall function.  REHAB POTENTIAL: Good  PLAN: SLP FREQUENCY: 2x/week  SLP DURATION: 12 weeks  PLANNED INTERVENTIONS: Language facilitation, Environmental controls, Cueing hierachy, Cognitive reorganization, Internal/external aids, Functional tasks, SLP instruction and feedback, and Compensatory strategies    Jazarah Capili, CCC-SLP 11/16/2021, 3:34 PM

## 2021-11-16 NOTE — Progress Notes (Signed)
[facility] [place], [place] [address] [phone]   Interval Evaluation  Date of Service: 11/16/21 Patient Name: Joanne Jackson Patient MRN: [id number] Patient DOB: Jun 06, 1975 Provider: Ventura Sellers, MD  Identifying Statement:  [name] is a 46 y.o. female with right frontal  high grade glioma, IDH mt    Oncologic History: Oncology History  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[place])  05/18/2021 Surgery   Right frontal craniotomy, resection with Dr. Tommi Rumps at [facility]; path demonstrates High Grade Glioma IDH-28mt.   06/19/2021 - 06/19/2021 Chemotherapy   Patient is on Treatment Plan : BRAIN GLIOBLASTOMA Radiation Therapy With Concurrent Temozolomide 75 mg/m2 Daily Followed By Sequential Maintenance Temozolomide x 6-12 cycles     08/28/2021 -  Chemotherapy   Patient is on Treatment Plan : BRAIN GLIOBLASTOMA Consolidation Temozolomide Days 1-5 q28 Days        Biomarkers:  MGMT Unknown.  IDH 1/2 Mutated.  EGFR Unknown  TERT Unmutated   Interval History: [name] presents today for follow up, now having completed cycle #3 adjuvant Temodar.  She had significant nausea and vomiting with the higher dose of chemo.  Constipation was more severe as well.  She has not been using her CPAP mask due to discomfort, sleep has been very poor again in recent weeks.  Currently on trazodone $RemoveBefo'75mg'YjPBgBonLNw$  HS.    H+P (06/12/21) Patient presented to medical attention in March 2023 with new onset seizures, characterized by shaking of the left hand without impaired awareness.  CNS imaging demonstrated non enhancing mass within right frontal lobe, c/w primary neoplasm.  She underwent craniotomy, resection on 05/18/21 with Dr. Tommi Rumps at [facility]; pathology was High Grade Glioma IDHmt.  Since surgery, husband has noticed more blunted (depressed?) mood, less responsive and engaging.  Otherwise she is fully independent and functional.  Referred to [facility]  from [facility] for local implementation of radiation and chemotherapy.  Medications: Current Outpatient Medications on File Prior to Visit  Medication Sig Dispense Refill   Cholecalciferol (VITAMIN D3) 50 MCG (2000 UT) TABS Take 2,000 Units by mouth daily as needed.      docusate sodium (COLACE) 100 MG capsule Take 1 capsule (100 mg total) by mouth 2 (two) times daily. 10 capsule 0   ferrous sulfate 324 (65 Fe) MG TBEC TAKE 1 TABLET BY MOUTH EVERY DAY (Patient taking differently: Take 324 mg by mouth daily.) 90 tablet 1   levETIRAcetam (KEPPRA) 500 MG tablet TAKE 1 TABLET BY MOUTH TWICE A DAY 180 tablet 3   lisinopril (ZESTRIL) 10 MG tablet Take 1 tablet (10 mg total) by mouth daily. 90 tablet 3   metoCLOPramide (REGLAN) 10 MG tablet Take 1 tablet (10 mg total) by mouth every 8 (eight) hours as needed for up to 12 doses for nausea. 12 tablet 0   omeprazole (PRILOSEC) 20 MG capsule Take 20 mg by mouth daily.     ondansetron (ZOFRAN) 8 MG tablet Take 1 tablet (8 mg total) by mouth 2 (two) times daily as needed (nausea and vomiting). May take 30-60 minutes prior to Temodar administration if nausea/vomiting occurs. 30 tablet 1   RESTASIS 0.05 % ophthalmic emulsion Place 1 drop into both eyes 2 (two) times daily.     Suvorexant (BELSOMRA) 20 MG TABS 1 at bedtime for sleep 30 tablet 1   temozolomide (TEMODAR) 140 MG capsule Take 1 capsule (140 mg total) by mouth daily. May take on an empty stomach  to decrease nausea & vomiting. 5 capsule 0   temozolomide (TEMODAR) 140 MG capsule Take 2 capsules (280 mg total) by mouth daily. May take on an empty stomach to decrease nausea & vomiting. 10 capsule 0   temozolomide (TEMODAR) 180 MG capsule Take 1 capsule (180 mg total) by mouth daily. May take on an empty stomach to decrease nausea & vomiting. 5 capsule 0   temozolomide (TEMODAR) 20 MG capsule Take 1 capsule (20 mg total) by mouth daily. May take on an empty stomach to decrease nausea & vomiting. 5 capsule 0    traZODone (DESYREL) 100 MG tablet Take 100 mg by mouth at bedtime.     No current facility-administered medications on file prior to visit.    Allergies: No Known Allergies Past Medical History:  Past Medical History:  Diagnosis Date   Brain cancer ([place])    Glioma ([place]) 05/18/2021   Hypertension    Pre-diabetes    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HCC)    Past Surgical History:  Past Surgical History:  Procedure Laterality Date   BRAIN SURGERY  05/2021   tumor removal   CESAREAN SECTION     DILATION AND CURETTAGE, DIAGNOSTIC / THERAPEUTIC     Social History:  Social History   Socioeconomic History   Marital status: Married    Spouse name: Not on file   Number of children: 1   Years of education: Not on file   Highest education level: Not on file  Occupational History   Occupation: Accounting  Tobacco Use   Smoking status: Never    Passive exposure: Never   Smokeless tobacco: Never  Vaping Use   Vaping Use: Never used  Substance and Sexual Activity   Alcohol use: Not Currently   Drug use: Never   Sexual activity: Yes    Partners: Male    Birth control/protection: Other-see comments, Condom    Comment: husband is infertile  Other Topics Concern   Not on file  Social History Narrative   Not on file   Social Determinants of Health   Financial Resource Strain: Not on file  Food Insecurity: Not on file  Transportation Needs: Not on file  Physical Activity: Not on file  Stress: Not on file  Social Connections: Not on file  Intimate Partner Violence: Not on file   Family History:  Family History  Problem Relation Age of Onset   Thyroid disease Mother    Heart attack Father 5   Hypertension Father    Healthy Brother    Healthy Paternal Grandfather    Healthy Daughter     Review of Systems: Constitutional: Doesn't report fevers, chills or abnormal weight loss Eyes: Doesn't report blurriness of vision Ears, nose, mouth, throat, and  face: Doesn't report sore throat Respiratory: Doesn't report cough, dyspnea or wheezes Cardiovascular: Doesn't report palpitation, chest discomfort  Gastrointestinal:  Doesn't report nausea, constipation, diarrhea GU: Doesn't report incontinence Skin: Doesn't report skin rashes Neurological: Per HPI Musculoskeletal: Doesn't report joint pain Behavioral/Psych: Doesn't report anxiety  Physical Exam: There were no vitals filed for this visit.   KPS: 90. General: Alert, cooperative, pleasant, in no acute distress Head: Normal EENT: No conjunctival injection or scleral icterus.  Lungs: Resp effort normal Cardiac: Regular rate Abdomen: Non-distended abdomen Skin: No rashes cyanosis or petechiae. Extremities: No clubbing or edema  Neurologic Exam: Mental Status: Awake, alert, attentive to examiner. Oriented to self and environment. Language is fluent with intact comprehension. Abulic features noted. Cranial Nerves:  Visual acuity is grossly normal. Visual fields are full. Extra-ocular movements intact. No ptosis. Face is symmetric Motor: Tone and bulk are normal. Power is full in both arms and legs. Reflexes are symmetric, no pathologic reflexes present.  Sensory: Intact to light touch Gait: Normal.   Labs: I have reviewed the data as listed    Component Value Date/Time   NA 137 10/19/2021 1212   K 4.0 10/19/2021 1212   CL 106 10/19/2021 1212   CO2 26 10/19/2021 1212   GLUCOSE 102 (H) 10/19/2021 1212   BUN 15 10/19/2021 1212   CREATININE 0.52 10/19/2021 1212   CREATININE 0.50 11/24/2019 1449   CALCIUM 9.4 10/19/2021 1212   PROT 6.8 10/19/2021 1212   ALBUMIN 4.3 10/19/2021 1212   AST 13 (L) 10/19/2021 1212   ALT 16 10/19/2021 1212   ALKPHOS 49 10/19/2021 1212   BILITOT 0.8 10/19/2021 1212   GFRNONAA >60 10/19/2021 1212   GFRNONAA 119 11/24/2019 1449   GFRAA 138 11/24/2019 1449   Lab Results  Component Value Date   WBC 3.7 (L) 11/16/2021   NEUTROABS 2.8 11/16/2021    HGB 13.5 11/16/2021   HCT 38.5 11/16/2021   MCV 87.7 11/16/2021   PLT 189 11/16/2021    Imaging:    Assessment/Plan 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HCC)  Jamiyla Glassner is clinically stable today, now having cycle #3 adjuvant Temodar.  No new or progressive changes.  We recommended continuing treatment with cycle #4 Temozolomide 150 mg/m2, on for five days and off for twenty three days in twenty eight day cycles. The patient will have a complete blood count performed on days 21 and 28 of each cycle, and a comprehensive metabolic panel performed on day 28 of each cycle. Labs may need to be performed more often. Zofran will prescribed for home use for nausea/vomiting.   Chemotherapy should be held for the following:  ANC less than 1,000  Platelets less than 100,000  LFT or creatinine greater than 2x ULN  If clinical concerns/contraindications develop  May con't trazodone to $RemoveBefo'100mg'zvyoYAPacZX$  HS, also on Lunesta $RemoveBe'2mg'iGxDGbsfP$ .    She is following closely with sleep medicine for OSA treatment.  Keppra will con't $RemoveBe'500mg'CFiHbVIzf$  BID.  We appreciate the opportunity to participate in the care of Chasya Gubser.   We ask that Erisa Mehlman return to clinic in 1 months with MRI brain for evaluation prior to cycle #5 with labs for evaluation, or sooner as needed.  All questions were answered. The patient knows to call the clinic with any problems, questions or concerns. No barriers to learning were detected.  The total time spent in the encounter was 30 minutes and more than 50% was on counseling and review of test results   Ventura Sellers, MD Medical Director of Neuro-Oncology [facility] 11/16/21 12:25 PM

## 2021-11-17 ENCOUNTER — Other Ambulatory Visit: Payer: Self-pay

## 2021-11-20 NOTE — Therapy (Signed)
OUTPATIENT PHYSICAL THERAPY NEURO TREATMENT   Patient Name: Joanne Jackson MRN: [id number] DOB:10/22/1975, 46 y.o., female Today's Date: 11/21/2021   PCP: Leamon Arnt, MD REFERRING PROVIDER: Ancil Boozer, FNP    PT End of Session - 11/21/21 1612     Visit Number 3    Number of Visits 7    Date for PT Re-Evaluation 12/19/21    Authorization Type U[employer]    Authorization - Number of Visits 60   combined   PT Start Time 1532    PT Stop Time 1612    PT Time Calculation (min) 40 min    Equipment Utilized During Treatment Gait belt    Activity Tolerance Patient tolerated treatment well    Behavior During Therapy WFL for tasks assessed/performed;Flat affect               Past Medical History:  Diagnosis Date   Brain cancer ([place])    Glioma ([place]) 05/18/2021   Hypertension    Pre-diabetes    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[place])    Past Surgical History:  Procedure Laterality Date   BRAIN SURGERY  05/2021   tumor removal   CESAREAN SECTION     DILATION AND CURETTAGE, DIAGNOSTIC / THERAPEUTIC     Patient Active Problem List   Diagnosis Date Noted   Moderate obstructive sleep apnea 10/31/2021   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   Dyslipidemia 04/30/2021   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[place]) 04/30/2021   Episode of transient neurologic symptoms 04/28/2021   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[place]) 10/25/2020   History of gestational diabetes 10/25/2020   Iron deficiency anemia 12/03/2019   Tubular adenoma of colon 10/24/2018   Essential hypertension 12/26/2017   Prediabetes 12/26/2017   Intramural leiomyoma of uterus 07/29/2015    ONSET DATE: 05/2021  REFERRING DIAG: C71.9 (ICD-10-CM) - Malignant neoplasm of brain, unspecified  THERAPY DIAG:  Muscle weakness (generalized)  Hemiplegia and hemiparesis following cerebral infarction affecting left non-dominant side ([place])  Rationale for Evaluation and  Treatment Rehabilitation  SUBJECTIVE:                                                                                                                                                                                              SUBJECTIVE STATEMENT: Had acupuncture done last weekend.  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 No  PRECAUTIONS: None  PATIENT GOALS improve strength in L side   OBJECTIVE:     TODAY'S TREATMENT: 11/21/21 Activity Comments  Treadmill 2.0 mph at L2 grade x6 min  Cueing to extend L knee at heel strike, increase L step length and height  fwd/back hopping over threshold B Les 2x20"  Cueing to keep feet in line and avoid jumping on threshold   side to side hopping over threshold B Les 2x20" cueing to keep feet in line and avoid jumping on threshold;  observed running form outside on sidewalk 219f Slow jogging pace, hard strike on midfoot, excessive R knee/hip flexion but otherwise with good stability   4 way hip each LE with 1 walking pole and standing on foam 10x each Verbal/manual cues to avoid anterior/lateral trunk lean; less control on L LE  monster walk with red loop around ankles  Several trials focusing on proper form, weight shifting fully onto stepping LE, increasing L step length   Sidestepping with red loop around ankles 2x56fCueing to maintain hips neutral; excessive trunk lean      HOME EXERCISE PROGRAM Last updated: 11/14/21 Access Code: [email or web address]: [email or web address] Date: 11/14/2021 Prepared by: [facility]  Exercises - Marching with Resistance  - 1 x daily - 5 x weekly - 2 sets - 10 reps - Standing Hamstring Curl with Resistance  - 1 x daily - 5 x weekly - 2 sets - 10 reps - Mini Squat with Counter Support  - 1 x daily - 5 x weekly - 2 sets - 10 reps - Sidelying Hip  Abduction  - 1 x daily - 5 x weekly - 2 sets - 10 reps - Supine Bridge with Mini Swiss Ball Between Knees  - 1 x daily - 5 x weekly - 2 sets - 10 reps - Forward Step Down  - 1 x daily - 5 x weekly - 2 sets - 10 reps     Below measures were taken at time of initial evaluation unless otherwise specified:   DIAGNOSTIC FINDINGS: 04/29/21 brain MRI: No contrast enhancement at the site of signal abnormality in the right frontal lobe. This remains highly concerning for tumor, most likely a low-grade glioma  COGNITION: Overall cognitive status: Within functional limits for tasks assessed   SENSATION: WFL  COORDINATION: Alternating pronation/supination: WNL Alternating toe tap: WNL Finger to nose: WNL    POSTURE: No Significant postural limitations  LOWER EXTREMITY ROM:     Active  Right Eval Left Eval  Hip flexion    Hip extension    Hip abduction    Hip adduction    Hip internal rotation    Hip external rotation    Knee flexion    Knee extension    Ankle dorsiflexion 18 14  Ankle plantarflexion    Ankle inversion    Ankle eversion     (Blank rows = not tested)  LOWER EXTREMITY MMT:    MMT (in sitting) Right Eval Left Eval  Hip flexion 4+ 4  Hip extension    Hip abduction 4+ 3+  Hip adduction 4- 4-  Hip internal rotation    Hip external rotation    Knee flexion 4+ 4-  Knee extension 4+ 4-  Ankle dorsiflexion 4+ 4+  Ankle plantarflexion 4; 20 reps with compensations 4; 20 reps with compensations  Ankle inversion    Ankle eversion    (Blank rows = not tested)  GAIT: Gait pattern: WFL Assistive device utilized: None Level of assistance: Complete Independence   FUNCTIONAL TESTs:  5 times sit to stand: 9.73 sec Functional gait  assessment: 55      PATIENT EDUCATION: Education details: prognosis, POC, HEP Person educated: Patient Education method: Explanation, Demonstration, Tactile cues, Verbal cues, and Handouts Education comprehension:  verbalized understanding and returned demonstration   HOME EXERCISE PROGRAM: Access Code: [email or web address] URL: [email or web address] Date: 11/07/2021 Prepared by: [facility]  Exercises - Marching with Resistance  - 1 x daily - 5 x weekly - 2 sets - 10 reps - Standing Hamstring Curl with Resistance  - 1 x daily - 5 x weekly - 2 sets - 10 reps - Mini Squat with Counter Support  - 1 x daily - 5 x weekly - 2 sets - 10 reps - Sidelying Hip Abduction  - 1 x daily - 5 x weekly - 2 sets - 10 reps - Supine Bridge with Mini Swiss Ball Between Knees  - 1 x daily - 5 x weekly - 2 sets - 10 reps    GOALS: Goals reviewed with patient? Yes  SHORT TERM GOALS: Target date: 11/28/2021  Patient to be independent with initial HEP. Baseline: HEP initiated Goal status: MET 11/14/21    LONG TERM GOALS: Target date: 12/19/2021  Patient to be independent with advanced HEP. Baseline: Not yet initiated  Goal status: IN PROGRESS  Patient to demonstrate B LE strength >/=4/5.  Baseline: See above Goal status: IN PROGRESS  Patient to demonstrate good running form and stability on ground or treadmill.  Baseline: NT Goal status: IN PROGRESS    ASSESSMENT:  CLINICAL IMPRESSION: Patient arrived to session without new complaints. Worked on gait training on TM with cueing to increase step length on L and encourage L knee extension at heel strike. Observed hopping activities over threshold with patient demonstrating some difficulty with coordination and speed, but improved with practice. Patient's running form revealed hard midfoot strike and excessive R knee/hip flexion but otherwise with good safety and stability. Progressive hip strengthening activities required frequent cueing to maintain more upright posture and patient demonstrated less control on L LE. Patient tolerated session well and without complaints at end of session.    OBJECTIVE IMPAIRMENTS  decreased strength.   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   REHAB POTENTIAL: Good  CLINICAL DECISION MAKING: Evolving/moderate complexity  EVALUATION COMPLEXITY: Moderate  PLAN: PT FREQUENCY: 1x/week  PT DURATION: 6 weeks  PLANNED INTERVENTIONS: Therapeutic exercises, Therapeutic activity, Neuromuscular re-education, Balance training, Gait training, Patient/Family education, Self Care, Joint mobilization, Joint manipulation, Stair training, Vestibular training, Canalith repositioning, Visual/preceptual remediation/compensation, Aquatic Therapy, Dry Needling, Electrical stimulation, Cryotherapy, Moist heat, Taping, Manual therapy, and Re-evaluation  PLAN FOR NEXT SESSION: progress L LE strengthening, coordination, balance    Janene Harvey, PT, DPT 11/21/21 4:15 PM  [facility] at [facility] [address] [place], [place] [address] Phone # [phone] Fax # [phone]

## 2021-11-21 ENCOUNTER — Encounter: Payer: Self-pay | Admitting: Physical Therapy

## 2021-11-21 ENCOUNTER — Ambulatory Visit: Payer: 59 | Admitting: Physical Therapy

## 2021-11-21 DIAGNOSIS — M6281 Muscle weakness (generalized): Secondary | ICD-10-CM | POA: Diagnosis not present

## 2021-11-21 DIAGNOSIS — I69354 Hemiplegia and hemiparesis following cerebral infarction affecting left non-dominant side: Secondary | ICD-10-CM

## 2021-11-22 ENCOUNTER — Encounter: Payer: Self-pay | Admitting: Internal Medicine

## 2021-11-28 NOTE — Therapy (Signed)
OUTPATIENT PHYSICAL THERAPY NEURO TREATMENT   Patient Name: Joanne Jackson MRN: [id number] DOB:1975/11/18, 46 y.o., female Today's Date: 11/29/2021   PCP: Leamon Arnt, MD REFERRING PROVIDER: Ancil Boozer, FNP    PT End of Session - 11/29/21 1612     Visit Number 4    Number of Visits 7    Date for PT Re-Evaluation 12/19/21    Authorization Type [employer]    Authorization - Number of Visits 60   combined   PT Start Time 1533    PT Stop Time 1611    PT Time Calculation (min) 38 min    Equipment Utilized During Treatment Gait belt    Activity Tolerance Patient tolerated treatment well    Behavior During Therapy WFL for tasks assessed/performed;Flat affect                Past Medical History:  Diagnosis Date   Brain cancer ([place])    Glioma ([place]) 05/18/2021   Hypertension    Pre-diabetes    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[place])    Past Surgical History:  Procedure Laterality Date   BRAIN SURGERY  05/2021   tumor removal   CESAREAN SECTION     DILATION AND CURETTAGE, DIAGNOSTIC / THERAPEUTIC     Patient Active Problem List   Diagnosis Date Noted   Moderate obstructive sleep apnea 10/31/2021   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   Dyslipidemia 04/30/2021   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[place]) 04/30/2021   Episode of transient neurologic symptoms 04/28/2021   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[place]) 10/25/2020   History of gestational diabetes 10/25/2020   Iron deficiency anemia 12/03/2019   Tubular adenoma of colon 10/24/2018   Essential hypertension 12/26/2017   Prediabetes 12/26/2017   Intramural leiomyoma of uterus 07/29/2015    ONSET DATE: 05/2021  REFERRING DIAG: C71.9 (ICD-10-CM) - Malignant neoplasm of brain, unspecified  THERAPY DIAG:  Muscle weakness (generalized)  Hemiplegia and hemiparesis following cerebral infarction affecting left non-dominant side ([place])  Rationale for Evaluation  and Treatment Rehabilitation  SUBJECTIVE:                                                                                                                                                                                              SUBJECTIVE STATEMENT: Went to [place] for her birthday.  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 No  PRECAUTIONS: None  PATIENT GOALS improve strength in L side   OBJECTIVE:    TODAY'S TREATMENT: 11/29/21  Activity Comments  Treadmill walk 2.0 mph; walk/jog intervals 2.0/3.0 mph; total time 6.5 minutes  Cueing to increase step length; heavy reliance on [place]   bird dog 10x L hip dropping d/t weakness; manual cueing to maintain spine straight   resisted fire hydrant Limited ROM; manual cueing to maintain spine straight   elbows/toes plank Unable; transitioned to elbows/knees plank which was better tolerated 4x10"  SLS on foam 2x30" each 1 finger support; tendency for LOB L and falling into inversion, cueing to "push big toe down"  single leg hop in place 2x15" each; over threshold 15" each  Tendency to lean L when standing on L LE      HOME EXERCISE PROGRAM Last updated: 11/29/21 Access Code: [email or web address] URL: [email or web address] Date: 11/29/2021 Prepared by: [place] with Resistance  - 1 x daily - 5 x weekly - 2 sets - 10 reps - Standing Hamstring Curl with Resistance  - 1 x daily - 5 x weekly - 2 sets - 10 reps - Mini Squat with Counter Support  - 1 x daily - 5 x weekly - 2 sets - 10 reps - Sidelying Hip Abduction  - 1 x daily - 5 x weekly - 2 sets - 10 reps - Supine Bridge with Mini Swiss Ball Between Knees  - 1 x daily - 5 x weekly - 2 sets - 10 reps - Forward Step Down  - 1 x daily - 5 x weekly - 2 sets - 10 reps - Bird Dog  - 1 x daily - 5 x weekly - 2  sets - 10 reps - Plank on Knees  - 1 x daily - 5 x weekly - 4 sets - 10-15 sec hold   PATIENT EDUCATION: Education details: HEP update  Person educated: Patient Education method: Explanation, Demonstration, Tactile cues, Verbal cues, and Handouts Education comprehension: verbalized understanding and returned demonstration      Below measures were taken at time of initial evaluation unless otherwise specified:   DIAGNOSTIC FINDINGS: 04/29/21 brain MRI: No contrast enhancement at the site of signal abnormality in the right frontal lobe. This remains highly concerning for tumor, most likely a low-grade glioma  COGNITION: Overall cognitive status: Within functional limits for tasks assessed   SENSATION: WFL  COORDINATION: Alternating pronation/supination: WNL Alternating toe tap: WNL Finger to nose: WNL    POSTURE: No Significant postural limitations  LOWER EXTREMITY ROM:     Active  Right Eval Left Eval  Hip flexion    Hip extension    Hip abduction    Hip adduction    Hip internal rotation    Hip external rotation    Knee flexion    Knee extension    Ankle dorsiflexion 18 14  Ankle plantarflexion    Ankle inversion    Ankle eversion     (Blank rows = not tested)  LOWER EXTREMITY MMT:    MMT (in sitting) Right Eval Left Eval  Hip flexion 4+ 4  Hip extension    Hip abduction 4+ 3+  Hip adduction 4- 4-  Hip internal rotation    Hip external rotation    Knee flexion 4+ 4-  Knee extension 4+ 4-  Ankle dorsiflexion 4+ 4+  Ankle plantarflexion 4; 20 reps with compensations 4; 20 reps with compensations  Ankle inversion    Ankle eversion    (Blank rows = not tested)  GAIT: Gait pattern: [facility] Assistive device  utilized: None Level of assistance: Complete Independence   FUNCTIONAL TESTs:  5 times sit to stand: 9.73 sec Functional gait assessment: 29      PATIENT EDUCATION: Education details: prognosis, POC, HEP Person educated:  Patient Education method: Explanation, Demonstration, Tactile cues, Verbal cues, and Handouts Education comprehension: verbalized understanding and returned demonstration   HOME EXERCISE PROGRAM: Access Code: [email or web address] URL: [email or web address] Date: 11/07/2021 Prepared by: [facility]  Exercises - Marching with Resistance  - 1 x daily - 5 x weekly - 2 sets - 10 reps - Standing Hamstring Curl with Resistance  - 1 x daily - 5 x weekly - 2 sets - 10 reps - Mini Squat with Counter Support  - 1 x daily - 5 x weekly - 2 sets - 10 reps - Sidelying Hip Abduction  - 1 x daily - 5 x weekly - 2 sets - 10 reps - Supine Bridge with Mini Swiss Ball Between Knees  - 1 x daily - 5 x weekly - 2 sets - 10 reps    GOALS: Goals reviewed with patient? Yes  SHORT TERM GOALS: Target date: 11/28/2021  Patient to be independent with initial HEP. Baseline: HEP initiated Goal status: MET 11/14/21    LONG TERM GOALS: Target date: 12/19/2021  Patient to be independent with advanced HEP. Baseline: Not yet initiated  Goal status: IN PROGRESS  Patient to demonstrate B LE strength >/=4/5.  Baseline: See above Goal status: IN PROGRESS  Patient to demonstrate good running form and stability on ground or treadmill.  Baseline: NT Goal status: IN PROGRESS    ASSESSMENT:  CLINICAL IMPRESSION: Patient arrived to session without new complaints worked on running analysis on treadmill, encouraging elongated step length throughout. Worked on quadruped activities for core strengthening, which revealed lacking L hip stability. Required breaks d/t fatigue intermittently. Single leg hopping on L LE revealed tendency for L lateral lean but with good carryover of cueing. Patient reported understanding of HEP update and without complaints at end of session.    OBJECTIVE IMPAIRMENTS decreased strength.   ACTIVITY LIMITATIONS carrying, lifting, bending, and  stairs  PARTICIPATION LIMITATIONS: cleaning, laundry, and yard work  PERSONAL FACTORS Age, Past/current experiences, Time since onset of injury/illness/exacerbation, and 3+ comorbidities: R frontal glioma s/p resection 05/2021 and chemo/radiation, HTN, sjogren's  are also affecting patient's functional outcome.   REHAB POTENTIAL: Good  CLINICAL DECISION MAKING: Evolving/moderate complexity  EVALUATION COMPLEXITY: Moderate  PLAN: PT FREQUENCY: 1x/week  PT DURATION: 6 weeks  PLANNED INTERVENTIONS: Therapeutic exercises, Therapeutic activity, Neuromuscular re-education, Balance training, Gait training, Patient/Family education, Self Care, Joint mobilization, Joint manipulation, Stair training, Vestibular training, Canalith repositioning, Visual/preceptual remediation/compensation, Aquatic Therapy, Dry Needling, Electrical stimulation, Cryotherapy, Moist heat, Taping, Manual therapy, and Re-evaluation  PLAN FOR NEXT SESSION: progress L LE strengthening, coordination, balance    Janene Harvey, PT, DPT 11/29/21 4:14 PM  [facility] at [facility] [address] [place], [place] [address] Phone # [phone] Fax # [phone]

## 2021-11-29 ENCOUNTER — Ambulatory Visit: Payer: 59

## 2021-11-29 ENCOUNTER — Encounter: Payer: Self-pay | Admitting: Physical Therapy

## 2021-11-29 ENCOUNTER — Other Ambulatory Visit: Payer: Self-pay

## 2021-11-29 ENCOUNTER — Ambulatory Visit: Payer: 59 | Admitting: Occupational Therapy

## 2021-11-29 ENCOUNTER — Ambulatory Visit: Payer: 59 | Admitting: Physical Therapy

## 2021-11-29 DIAGNOSIS — I69354 Hemiplegia and hemiparesis following cerebral infarction affecting left non-dominant side: Secondary | ICD-10-CM

## 2021-11-29 DIAGNOSIS — R278 Other lack of coordination: Secondary | ICD-10-CM

## 2021-11-29 DIAGNOSIS — M6281 Muscle weakness (generalized): Secondary | ICD-10-CM

## 2021-11-29 DIAGNOSIS — R41841 Cognitive communication deficit: Secondary | ICD-10-CM

## 2021-11-29 NOTE — Therapy (Signed)
OUTPATIENT SPEECH LANGUAGE PATHOLOGY TREATMENT SESSION   Patient Name: Joanne Jackson MRN: [id number] DOB:[birth year], 46 y.o., female Today's Date: 10/13/2021  PCP: Leamon Arnt., MD REFERRING PROVIDER: Kathyrn Drown, MD    End of Session - 11/29/21 2341     Visit Number 10    Number of Visits 25    Date for SLP Re-Evaluation 01/05/22    Authorization - Number of Visits 45    SLP Start Time 1628    SLP Stop Time  0349    SLP Time Calculation (min) 41 min    Activity Tolerance Patient tolerated treatment well                     Past Medical History:  Diagnosis Date   Brain cancer ([place])    Glioma ([place]) 05/18/2021   Hypertension    Pre-diabetes    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[place])    Past Surgical History:  Procedure Laterality Date   BRAIN SURGERY  05/2021   tumor removal   CESAREAN SECTION     DILATION AND CURETTAGE, DIAGNOSTIC / THERAPEUTIC     Patient Active Problem List   Diagnosis Date Noted   Moderate obstructive sleep apnea 10/31/2021   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   Dyslipidemia 04/30/2021   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[place]) 04/30/2021   Episode of transient neurologic symptoms 04/28/2021   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[place]) 10/25/2020   History of gestational diabetes 10/25/2020   Iron deficiency anemia 12/03/2019   Tubular adenoma of colon 10/24/2018   Essential hypertension 12/26/2017   Prediabetes 12/26/2017   Intramural leiomyoma of uterus 07/29/2015    ONSET DATE: 05/18/21   REFERRING DIAG: D49.6 (ICD-10-CM) - Neoplasm of unspecified behavior of brain   THERAPY DIAG:  Cognitive communication deficit  Rationale for Evaluation and Treatment Rehabilitation  SUBJECTIVE:   SUBJECTIVE STATEMENT: "I gave a 40% tip. I did not want to do that." Pt accompanied by: self  PERTINENT HISTORY: Patient presented to medical attention in March 2023 with new  onset seizures, characterized by shaking of the left hand without impaired awareness. CNS imaging demonstrated non enhancing mass within right frontal lobe, c/w primary neoplasm. She underwent craniotomy, resection on 05/18/21 with Dr. Tommi Rumps at [facility]; pathology was High Grade Glioma IDHmt. Since surgery, husband has noticed more blunted (depressed?) mood, less responsive and engaging. Otherwise she is fully independent and functional. Referred to [facility] from [facility] for local implementation of radiation and chemotherapy. Last day of IMRT and Temodar yesterday (07-31-21). Pt had HHST upon d/c from [facility].  PAIN:  Are you having pain? No  PATIENT GOALS  None stated today when SLP asked, due to decr'd intellectual awareness.   OBJECTIVE:  TODAY'S TREATMENT:  11/29/21: SLP corrected pt homework with her - 46/51 success. Pt did NOT double check her answers - SLP reminded (again) to ALWAYS double check her answers. SLP inquired about if pt's decr'd attetion/attention to detail was problematic in the last two weeks and pt described a situation where she gave 40% tip due to distraction with how she put her card in card reader at hair salon. Pt was able to tell SLP how to change this situation next time without cues.   11/16/21: Pt returned today because sleep a little better regulated as 3-4 hours/night. Pt will cont to undergo her chemo cycle until middle of next year and then plan will change. She agrees with SLP that the further she progresses  away from last day of chemo, then gets worse when chemo cycle begins again. Pt is compensating with appropriate compensations for memory, continues to make errors rarely - pt gave example of email details, but always cc:'s husband to have second set of eyes in order to correct her if necessary. She is paying some bills with double checking/supervision from E, she is using alarms (for meds) and timers, and is taking brain breaks when necessary for as long as she can up to 20  minutes. Pt agreed with SLP that frequency could be x1 every other week with d/c in 2-3 visits, depending on pt status. SLP provided pt with some homework for detailed work and error awareness.   10/13/21: Pt returned PROM today; results below in "PROM". SLP and pt discussed whether or not pt should cont with ST in the next 2-3 weeks given so little sleep the last 7-8 days. Pt reports being very tired today and SLP believes this will impede pt's performance today and her progress in the future until her sleep gets better regulated. Pt agreed to cx September appointments to allow her sleep to get regulated better - a sleep study is currently undergoing a long approval process from pt's insurance, pt reports.  Today, SLP assisted pt in detailed functional written language tasks in which pt req'd min-mod cues occasionally for error awareness and for alternating attention.   10/05/21: Pt brought in written homework with many erase marks. "These were hard," pt stated. SLP gave pt "code" homework task which req'd her to alternate attention between response and stimuli. Pt stated she, eventually, used a straight edge as a guide and this was less difficult than no guide. Today she alternated attention WFL/WNL between detailed written tasks with cues for attention to detail. When she double-checked her answers she found all but one of her mistakes. SLP had door open for mod noise and pt req'd cue for "brain break" but she did not take one until SLP cued a second time; "I was still working," pt stated. She took 3 minute break and went back to tasks. SLP provided pt and husband PROM for cognition and asked pt return these next session. [name], at end of session today indicated she needs to use compensations for deficits in memory, for attention, and for mental fatigue.  09/26/21: Pt reports she will begin Temodar again tonight. Has possible sleep study tomorrow night. Homework completed in full, 100% success. Pt reported using  sticky notes successfully for recall of turning lights off and bringing her glasses downstairs. Today SLP used practical written math problems to target attending to details. Pt req'd consistent min A to note errors; Did not check work immediately (problem after problem), and pt had 3/6 incorrect after her double checking. Pt noted to move very quickly with math and make errors due to decr'd alternating attention writing down all figures needed for accuracy. SLP encouraged pt to follow with her finger to incr accuracy, and to cross off figures when she has written them down to aid in alternating attention.  09/19/21: Pt entered with homework completed. [name] told SLP she was able to make brain breaks work with her daughter accompanying her with a timer and muted iPad. In detailed tasks pt today did not incorporate a strategy for written language comprehension so she made errors (three). She fixed two, but one took SLP verbal cue for written organization. Homework for written detailed task.  09/12/21: E expressed pt having difficulty with processing spoken information.  SLP believes something to do with pt's attention level as well as with possible decr in auditory/overall processing. Pt brought her homework - evidence pt completed without double-checking her answers, and did not slow down and look at the task prior to completing; Errors were due to incomplete procedures (pt did not look fully at the information provided and wrote down the first item she saw). Pt has not been using "brain breaks" when her daughter is present because her daughter follows her wherever she goes. SLP provided some suggestions in "pt instructions" for pt and husband to try at home. SLP provided homework for auditory attention/processing for husband to complete with pt.  09/06/21: Pt showed SLP her written homework. On her corrected copy she still had errors. SLP assisted pt in organizing her responses and saw approx 9 minutes into task that  pt stopped completely. Pt endorsed mental fatigue so SLP asked pt to take a brain break for 3 minutes. Pt returned to task with better sustained/selective attention for remaining 6 minutes of task. SLP then educated and instructed pt on the importance of and examples of brain breaks when completing focused tasks. SLP asked pt what situations she has found herself that her brain needs rest from focusing and she said TV shows. SLP suggested stopping/pausing TV and doing mini-meditation session for 3-5 minutes and returning to task after brain break. SLP shared details of this session with pt's husband (with pt's permission).  08/29/21: CLQTwas completed today The Cognitive Linguistic Quick Test (CLQT) was administered to assess the relative status of five cognitive domains: attention, memory, language, executive functioning, and visuospatial skills. Scores from 10 tasks were used to estimate severity ratings (standardized for age groups 18-69 years and 70-89 years) for each domain, a clock drawing task, as well as an overall composite severity rating of cognition.      Task Score Criterion Cut Scores  Personal Facts 8/8 8  Symbol Cancellation 12/12 11  Confrontation Naming 10/10 10  Clock Drawing  10/13 12  Story Retelling 8/10 6  Symbol Trails 7/10 9  Generative Naming 4/10 5  Design Memory 6/6 5  Mazes  4/8 7  Design Generation 8/13 6    Cognitive Domain Composite Score Severity Rating  Attention 181/215 WNL (low)  Memory 168/185 WNL  Executive Function 23/40 Mild  Language 30/37 WNL (low)  Visuospatial Skills 82/105 WNL (low)  Clock Drawing  10/13 Mild  Composite Severity Rating 3.8 WNL   Overall composite rating is "WNL" however on CLQT pt demonstrated decr'd skills in awareness, attention, executive function, and a lack of impulse control. Impulsivity decr'd pt's score specifically with clock drawing, mazes, and symbol trails.   (Date of eval): SLP discussed evaluation results thus  far, and Constant Therapy was introduced and explained.     PATIENT REPORTED OUTCOME MEASURES (PROM): Cognitive Function: E scored [name] 31/40, and [name] scored herself 32/40 (higher numbers indicate better QOL)    PATIENT EDUCATION: Education details: See "today's treatment" Person educated: Patient Education method: Explanation Education comprehension: verbalized understanding and needs further education   GOALS: Goals reviewed with patient? Yes  SHORT TERM GOALS:  Target date: 11/10/2021 (extended due to pt resuming ST in October)  Pt will demo [facility] selective attention with functional tasks in mod noisy environment for 20 minutes in 2 sessions  Baseline: 09/26/21 Goal status: met  2.  Pt will demo WFL alternating attention between billpay task and additional random tasks in 2 sessions  Baseline: 10/05/21 Goal status:  not met  3.  Pt will acknowledge at least two areas of deficit during three therapy sessions  Baseline: 09/19/21, 09-26-21 Goal status: Met  4.  [name] will note 80% of errors in therapy tasks when given chance to self correct in 3 sessions Baseline: 09/19/21, 10-05-21 Goal status: not met  5.  [name] will reportedly demonstrate less frequent "absent-mindedness" in the home (doors open, lights on, etc) than when compared to prior to OP ST, as reported by pt or E  Baseline:  Goal status: Met  LONG TERM GOALS: Target date: 01/05/2022  [name] will score better on E's and her own Cognitive PROM than during her first therapy session Baseline:  Goal status: Ongoing  2.  Pt will demo functional alternating attention with mod complex tasks in therapy session x3 and will reportedly demo functional alternating attention at home as reported by pt or by E, x3  Baseline: 11-16-21, 11-29-21 Goal status: Ongoing  3.  [name] will indicate anticipatory awareness during the session or at home as reported by pt or E, x5 Baseline: 11/16/21, 11-29-21 Goal status: Ongoing  4.  Pt will exhibit  WFL organization and planning by paying bills independently Baseline: 11/16/21 Goal status: Ongoing   ASSESSMENT:  CLINICAL IMPRESSION: Patient is a 46 y.o. female who was seen today for cognitive communication tx. SEE THERAPY NOTE. [name] requires improved cognition for responsibilities at home, and is using compensatory strategies appropriately.    OBJECTIVE IMPAIRMENTS include attention, memory, awareness, and executive functioning. These impairments are limiting patient from return to work, managing medications, managing appointments, managing finances, household responsibilities, and effectively communicating at home and in community. Factors affecting potential to achieve goals and functional outcome are ability to learn/carryover information and co-morbidities.. Patient will benefit from skilled SLP services to address above impairments and improve overall function.  REHAB POTENTIAL: Good  PLAN: SLP FREQUENCY: 2x/week  SLP DURATION: 12 weeks  PLANNED INTERVENTIONS: Language facilitation, Environmental controls, Cueing hierachy, Cognitive reorganization, Internal/external aids, Functional tasks, SLP instruction and feedback, and Compensatory strategies    Iantha Titsworth, CCC-SLP 11/29/2021, 11:42 PM

## 2021-11-29 NOTE — Therapy (Signed)
OUTPATIENT OCCUPATIONAL THERAPY NEURO EVALUATION  Patient Name: Joanne Jackson MRN: [id number] DOB:March 02, 1975, 46 y.o., female Today's Date: 11/30/2021  PCP: Leamon Arnt, MD REFERRING PROVIDER: Ancil Boozer, [place]    OT End of Session - 11/30/21 0859     Visit Number 1    Number of Visits 7    Date for OT Re-Evaluation 01/12/22    Authorization Type [employer]    OT Start Time 1448    OT Stop Time 6644    OT Time Calculation (min) 42 min             Past Medical History:  Diagnosis Date   Brain cancer ([place])    Glioma ([place]) 05/18/2021   Hypertension    Pre-diabetes    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[place])    Past Surgical History:  Procedure Laterality Date   BRAIN SURGERY  05/2021   tumor removal   CESAREAN SECTION     DILATION AND CURETTAGE, DIAGNOSTIC / THERAPEUTIC     Patient Active Problem List   Diagnosis Date Noted   Moderate obstructive sleep apnea 10/31/2021   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   Dyslipidemia 04/30/2021   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[place]) 04/30/2021   Episode of transient neurologic symptoms 04/28/2021   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[place]) 10/25/2020   History of gestational diabetes 10/25/2020   Iron deficiency anemia 12/03/2019   Tubular adenoma of colon 10/24/2018   Essential hypertension 12/26/2017   Prediabetes 12/26/2017   Intramural leiomyoma of uterus 07/29/2015    ONSET DATE: referral date 11/08/21  REFERRING DIAG: C71.9 (ICD-10-CM) - Malignant neoplasm of brain, unspecified R53.1 (ICD-10-CM) - Weakness   THERAPY DIAG:  Muscle weakness (generalized)  Hemiplegia and hemiparesis following cerebral infarction affecting left non-dominant side ([place])  Other lack of coordination  Rationale for Evaluation and Treatment Rehabilitation  SUBJECTIVE:   SUBJECTIVE STATEMENT: Patient reports that she is done with radiation but is now on oral chemo  meds, doing 3 weeks on, 3 weeks off. Reports that her oncologist at [facility] recommended OT since her L side was still weak and she strongly recommended that she return to therapy. Denies problems with any ADLs or IADLs, however is agreeable to engagement in therapy to address weakness. 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 No  FALLS: Has patient fallen in last 6 months? 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  OBJECTIVE:   HAND DOMINANCE: Right  ADLs: Overall ADLs: Mod I with all bathing and dressing tasks.  IADLs: Shopping: Husband will drive and then they grocery shop together Light housekeeping: Mod I Meal Prep: Mod I Community mobility: husband is doing all the driving Medication management: Mod I Financial management: finances are on autopayment  MOBILITY STATUS: Independent  POSTURE COMMENTS:  No Significant postural limitations  ACTIVITY TOLERANCE: Activity tolerance: WFL for tasks assessed on eval  UPPER EXTREMITY ROM:    Active ROM Right eval Left eval  Shoulder flexion 150 150  Shoulder abduction    Shoulder adduction    Shoulder extension  Shoulder internal rotation [facility]  Shoulder external rotation [facility] WFL  Elbow flexion [facility] WFL  Elbow extension [facility] WFL  Wrist flexion    Wrist extension    Wrist ulnar deviation    Wrist radial deviation    Wrist pronation    Wrist supination    (Blank rows = not tested)  UPPER EXTREMITY MMT:     MMT Right eval Left eval  Shoulder flexion 4+/5 4/5  Shoulder abduction     Shoulder adduction    Shoulder extension    Shoulder internal rotation    Shoulder external rotation    Middle trapezius    Lower trapezius    Elbow flexion 4+ 4  Elbow extension 4+ 4  Wrist flexion    Wrist extension    Wrist ulnar deviation    Wrist radial deviation    Wrist pronation    Wrist supination    (Blank rows = not tested)  HAND FUNCTION: Grip strength: Right: 42 lbs; Left: 25 lbs, Lateral pinch: Right: 17 lbs, Left: 12 lbs, and 3 point pinch: Right: 13 lbs, Left: 8 lbs  COORDINATION: Finger Nose Finger test: Symmetrical bilaterally, completed 11 in 10 secs on R, 10 in 10 secs on L 9 Hole Peg test: Right: 21.25 sec; Left: 32.57 sec Box and Blocks:  Right 56 blocks, Left 45 blocks  SENSATION: WFL   COGNITION: Overall cognitive status: Within functional limits for tasks assessed  VISION: Subjective report: updated prescription a few months ago Baseline vision: Wears glasses all the time Visual history:  N/A  VISION ASSESSMENT: WFL  PERCEPTION: WFL  PRAXIS: WFL   TODAY'S TREATMENT:                                                                         DATE: 11/29/21 Engaged in fine motor coordination education with focus on picking up variety of small items one at a time and placing in container, picking up coins and stacking, picking up coins and placing in coin slot, picking up coins and translating finger tips > palm > finger tips to place on table or in container.  Educated on additional coordination tasks with stringing beads, threading nuts/bolts, and provided with handout.  PATIENT EDUCATION: Education details: Educated on role and purpose of OT as well as potential interventions and goals for therapy based on initial evaluation findings. Person educated: Patient Education method: Explanation, Demonstration, and Handouts Education comprehension: verbalized understanding and needs further education  HOME EXERCISE PROGRAM: Fine motor coordination  handout provided   GOALS: Goals reviewed with patient? Yes  SHORT TERM GOALS: Target date: 12/22/21    Status:  1 Pt will demonstrate improved fine motor coordination for ADLs as evidenced by decreasing 9 hole peg test score for LUE by 4 secs Baseline: Right: 21.25 sec; Left: 32.57 sec Initial  2 Pt will demonstrate improved UE functional use for ADLs as evidenced by increasing box/ blocks score by 3 blocks with LUE Baseline: Right 56 blocks, Left 45 blocks Initial  3 Pt will be independent in strengthening and coordination HEP. Baseline:  Initial    LONG TERM GOALS: Target date: 01/12/22    Status:  1 Pt will demonstrate improved fine motor coordination for  ADLs as evidenced by decreasing 9 hole peg test score for LUE by 7 secs Baseline: Right: 21.25 sec; Left: 32.57 sec Initial  2 Pt will demonstrate improved UE functional use for ADLs as evidenced by increasing box/ blocks score by 6 blocks with LUE Baseline: Right 56 blocks, Left 45 blocks Initial  3 Pt will demonstrate improved LUE strength as needed to engage in ADLs and IADLs Baseline:  Initial  4 Pt will demonstrate improved functional reach in LUE to obtain moderate weight items at moderate range with LUE and/or BUE and transport to another surface as needed to engage in ADLs and IADLs Baseline:  Initial  5 Pt will verbalize understanding of energy conservation strategies to increase independence and safety with ADLs and IADLs. Baseline:  Initial    ASSESSMENT:  CLINICAL IMPRESSION: Patient is a 46 y.o. female who was seen today for occupational therapy evaluation for LUE weakness s/p tumor resection.  Pt currently lives with spouse and 19 yo daughter in a 2 story home with entry level and bedroom on 2nd floor. Pt worked as an Optometrist prior to tumor resection and would like to return to work some time next year.  PMHx includes R frontal glioma, HTN, sjogren's . Pt will benefit from skilled occupational therapy services to  address strength and coordination, ROM, balance, GM/FM control, safety awareness, introduction of compensatory strategies/AE prn, and implementation of an HEP to improve participation and safety during ADLs and IADLs. Marland Kitchen   PERFORMANCE DEFICITS: in functional skills including ADLs, IADLs, coordination, ROM, strength, FMC, GMC, balance, endurance, decreased knowledge of precautions, decreased knowledge of use of DME, and UE functional use.   IMPAIRMENTS: are limiting patient from ADLs, IADLs, and work.   CO-MORBIDITIES; may have co-morbidities  that affects occupational performance. Patient will benefit from skilled OT to address above impairments and improve overall function.  MODIFICATION OR ASSISTANCE TO COMPLETE EVALUATION: No modification of tasks or assist necessary to complete an evaluation.  OT OCCUPATIONAL PROFILE AND HISTORY: Detailed assessment: Review of records and additional review of physical, cognitive, psychosocial history related to current functional performance.  CLINICAL DECISION MAKING: LOW - limited treatment options, no task modification necessary  REHAB POTENTIAL: Good  EVALUATION COMPLEXITY: Low    PLAN:  OT FREQUENCY: 1x/week  OT DURATION: 6 weeks  PLANNED INTERVENTIONS: self care/ADL training, therapeutic exercise, therapeutic activity, neuromuscular re-education, balance training, functional mobility training, aquatic therapy, patient/family education, psychosocial skills training, energy conservation, coping strategies training, and DME and/or AE instructions  RECOMMENDED OTHER SERVICES: N/A  CONSULTED AND AGREED WITH PLAN OF CARE: Patient  PLAN FOR NEXT SESSION: Engage in functional reaching task, Initiate strengthening HEP for UE and hand, review Coordination HEP   Hubert Derstine, [place], OTR/L 11/30/2021, 9:00 AM

## 2021-11-30 ENCOUNTER — Other Ambulatory Visit: Payer: Self-pay | Admitting: Internal Medicine

## 2021-11-30 DIAGNOSIS — C719 Malignant neoplasm of brain, unspecified: Secondary | ICD-10-CM

## 2021-12-05 NOTE — Therapy (Incomplete)
OUTPATIENT PHYSICAL THERAPY NEURO TREATMENT   Patient Name: Joanne Jackson MRN: [id number] DOB:February 11, 1975, 46 y.o., female Today's Date: 12/05/2021   PCP: Leamon Arnt, MD REFERRING PROVIDER: Ancil Boozer, FNP         Past Medical History:  Diagnosis Date   Brain cancer [facility])    Glioma (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  Rationale for Evaluation and Treatment Rehabilitation  SUBJECTIVE:                                                                                                                                                                                              SUBJECTIVE STATEMENT: Went to [facility] for her [facility].  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 No  PRECAUTIONS: None  PATIENT GOALS improve strength in L side   OBJECTIVE:     TODAY'S TREATMENT:  12/06/21 Activity Comments                       HOME EXERCISE PROGRAM Last updated: 11/29/21 Access Code: [email or web address] URL: [email or web address] Date: 11/29/2021 Prepared by: [place] with Resistance  - 1 x daily - 5 x weekly - 2 sets - 10 reps - Standing Hamstring Curl with Resistance  - 1 x daily - 5 x weekly - 2 sets - 10 reps - Mini Squat with Counter Support  - 1 x daily - 5 x weekly - 2 sets - 10 reps - Sidelying Hip Abduction  - 1 x  daily - 5 x weekly - 2 sets - 10 reps - Supine Bridge with Mini Swiss Ball Between Knees  - 1 x daily - 5 x weekly - 2 sets - 10 reps - Forward Step Down  - 1 x daily - 5 x weekly - 2 sets - 10 reps - Bird Dog  - 1 x daily - 5 x weekly - 2 sets - 10 reps - Plank on Knees  - 1 x daily - 5 x weekly - 4 sets - 10-15 sec hold     Below measures were taken at time of initial evaluation unless otherwise specified:   DIAGNOSTIC FINDINGS: 04/29/21 brain MRI: No contrast enhancement at the site of signal abnormality in the right frontal lobe. This remains highly concerning for tumor, most likely a low-grade glioma  COGNITION: Overall cognitive status: Within functional limits for tasks assessed   SENSATION: WFL  COORDINATION: Alternating pronation/supination: WNL Alternating toe tap: WNL Finger to nose: WNL    POSTURE: No Significant postural limitations  LOWER EXTREMITY ROM:     Active  Right Eval Left Eval  Hip flexion    Hip extension    Hip abduction    Hip adduction    Hip internal rotation    Hip external rotation    Knee flexion    Knee extension    Ankle dorsiflexion 18 14  Ankle plantarflexion    Ankle inversion    Ankle eversion     (Blank rows = not tested)  LOWER EXTREMITY MMT:    MMT (in sitting) Right Eval Left Eval  Hip flexion 4+ 4  Hip extension    Hip abduction 4+ 3+  Hip adduction 4- 4-  Hip internal rotation    Hip  external rotation    Knee flexion 4+ 4-  Knee extension 4+ 4-  Ankle dorsiflexion 4+ 4+  Ankle plantarflexion 4; 20 reps with compensations 4; 20 reps with compensations  Ankle inversion    Ankle eversion    (Blank rows = not tested)  GAIT: Gait pattern: WFL Assistive device utilized: None Level of assistance: Complete Independence   FUNCTIONAL TESTs:  5 times sit to stand: 9.73 sec Functional gait assessment: 29      PATIENT EDUCATION: Education details: prognosis, POC, HEP Person educated: Patient Education method: Explanation, Demonstration, Tactile cues, Verbal cues, and Handouts Education comprehension: verbalized understanding and returned demonstration   HOME EXERCISE PROGRAM: Access Code: [email or web address] URL: [email or web address] Date: 11/07/2021 Prepared by: [facility]  Exercises - Marching with Resistance  - 1 x daily - 5 x weekly - 2 sets - 10 reps - Standing Hamstring Curl with Resistance  - 1 x daily - 5 x weekly - 2 sets - 10 reps - Mini Squat with Counter Support  - 1 x daily - 5 x weekly - 2 sets - 10 reps - Sidelying Hip Abduction  - 1 x daily - 5 x weekly - 2 sets - 10 reps - Supine Bridge with Mini Swiss Ball Between Knees  - 1 x daily - 5 x weekly - 2 sets - 10 reps    GOALS: Goals reviewed with patient? Yes  SHORT TERM GOALS: Target date: 11/28/2021  Patient to be independent with initial HEP. Baseline: HEP initiated Goal status: MET 11/14/21    LONG TERM GOALS: Target date: 12/19/2021  Patient to be independent with advanced HEP. Baseline: Not yet initiated  Goal status: IN PROGRESS  Patient  to demonstrate B LE strength >/=4/5.  Baseline: See above Goal status: IN PROGRESS  Patient to demonstrate good running form and stability on ground or treadmill.  Baseline: NT Goal status: IN PROGRESS    ASSESSMENT:  CLINICAL IMPRESSION: Patient arrived to session without new complaints  worked on running analysis on treadmill, encouraging elongated step length throughout. Worked on quadruped activities for core strengthening, which revealed lacking L hip stability. Required breaks d/t fatigue intermittently. Single leg hopping on L LE revealed tendency for L lateral lean but with good carryover of cueing. Patient reported understanding of HEP update and without complaints at end of session.    OBJECTIVE IMPAIRMENTS decreased strength.   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   REHAB POTENTIAL: Good  CLINICAL DECISION MAKING: Evolving/moderate complexity  EVALUATION COMPLEXITY: Moderate  PLAN: PT FREQUENCY: 1x/week  PT DURATION: 6 weeks  PLANNED INTERVENTIONS: Therapeutic exercises, Therapeutic activity, Neuromuscular re-education, Balance training, Gait training, Patient/Family education, Self Care, Joint mobilization, Joint manipulation, Stair training, Vestibular training, Canalith repositioning, Visual/preceptual remediation/compensation, Aquatic Therapy, Dry Needling, Electrical stimulation, Cryotherapy, Moist heat, Taping, Manual therapy, and Re-evaluation  PLAN FOR NEXT SESSION: progress L LE strengthening, coordination, balance    Janene Harvey, PT, DPT 12/05/21 1:51 PM  [facility] [address] [place], [place] [address] Phone # [phone] Fax # [phone]

## 2021-12-06 ENCOUNTER — Ambulatory Visit: Payer: 59 | Admitting: Physical Therapy

## 2021-12-06 ENCOUNTER — Other Ambulatory Visit: Payer: Self-pay | Admitting: Internal Medicine

## 2021-12-06 ENCOUNTER — Ambulatory Visit: Payer: 59 | Admitting: Behavioral Health

## 2021-12-06 DIAGNOSIS — C719 Malignant neoplasm of brain, unspecified: Secondary | ICD-10-CM

## 2021-12-10 NOTE — Progress Notes (Deleted)
10/30/2021  Volanda Napoleon, NP Patient presents today as self-referral for sleep consult due to insomnia. She was diagnosed with sleep apnea by home test in July 2023 by Dr. Michela Pitcher, AHI 22/hour. She tried wearing CPAP for 2-3 weeks but was intolerant.  Her husband states that it did not help her fall asleep and machine was a distraction and causes more problems for her. Her biggest issues is trouble fallings asleep. She has tried trazodone, temazepam '15mg'$  and Lunesta '1mg'$ . Johnnye Sima does was increased to '2mg'$  but they are awaiting for insurance approval. All medications helped for a couple of days and then stopped workings. She never had CPAP titration study in lab. Not clear if mask or pressure issues. She is currently on chemotherapy. Before she had brain surgery she did not have insomnia.  Sleep apnea: - Recommend you get a wedge pillow to elevate your head while sleeping at night - Consider getting an oral device for treatment of your sleep apnea.  We will refer you to an orthodontic dentist who can do this. Insomnia: - [facility] their number is [phone].  Don't accept her insurance. - Increase Lunesta to 2 mg to take at bedtime for insomnia Follow-up - 4 to 6 weeks with any of our sleep doctors (Dr. Annamaria Boots, olalere, alva or sood) for insomnia and OSA/  if nothing open can see [name]  ------------------------------------------------------------------------------------------- 11/05/21- 26 yoF for help with OSA, Insomnia. 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 She has used CPAP machine for 2 weeks previously but it didn't help Body weight today-149 lbs Covid vax- 3 Phizer Flu vax- declines Now on Chemo/ XRT. She reports difficulty initiating sleep as detailed above. Transient help from meds tried. No  sleep at all in last 24 hours. Discussed effect of emotional strain associated with diagnosis and treatment, also possible physical impact of her treatment on brain function, although treatment would not have directly impacted brainstem. Our goal needs to be to help restore restful sleep without increasing difficulty of protective waking during apnea events. She has not been called yet about dental referral to consider oral appliance as an alternative option for sleep apnea.  12/12/21- 89 yoF for help with OSA, Insomnia. 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 She has used CPAP machine for 2 weeks previously but it didn't help -Belsomra 20, Trazodone 100,  Body weight today- Covid vax- 3 Phizer Flu vax- declines Now on Chemo/ XRT. Referred for OAP-   ROS-see HPI   + = positive Constitutional:    weight loss, night sweats, fevers, chills,+ fatigue, lassitude. HEENT:    headaches, difficulty swallowing, tooth/dental problems, sore throat,       sneezing, itching, ear ache, nasal congestion, post nasal drip, snoring CV:    chest pain, orthopnea, PND, swelling in lower extremities, anasarca,                                   dizziness, palpitations Resp:   shortness of breath with exertion or at rest.                productive cough,   non-productive cough, coughing up of blood.  change in color of mucus.  wheezing.   Skin:    rash or lesions. GI:  No-   heartburn, indigestion, abdominal pain, nausea, vomiting, diarrhea,                 change in bowel habits, loss of appetite GU: dysuria, change in color of urine, no urgency or frequency.   flank pain. MS:   joint pain, stiffness, decreased range of motion, back pain. Neuro-     nothing unusual Psych:  change in mood or affect.  depression or +anxiety.   memory loss.  OBJ- Physical Exam General- Alert,  Oriented, Affect-appropriate, Distress- none acute Skin- rash-none, lesions- none, excoriation- none Lymphadenopathy- none Head- atraumatic            Eyes- Gross vision intact, PERRLA, conjunctivae and secretions clear            Ears- Hearing, canals-normal            Nose- Clear, no-Septal dev, mucus, polyps, erosion, perforation             Throat- Mallampati III-IV , mucosa clear , drainage- none, tonsils- atrophic Neck- flexible , trachea midline, no stridor , thyroid nl, carotid no bruit Chest - symmetrical excursion , unlabored           Heart/CV- RRR , no murmur , no gallop  , no rub, nl s1 s2                           - JVD- none , edema- none, stasis changes- none, varices- none           Lung- clear to P&A, wheeze- none, cough- none , dullness-none, rub- none           Chest wall-  Abd-  Br/ Gen/ Rectal- Not done, not indicated Extrem- cyanosis- none, clubbing, none, atrophy- none, strength- nl Neuro- grossly intact to observation

## 2021-12-11 ENCOUNTER — Ambulatory Visit: Payer: 59 | Attending: Neurological Surgery

## 2021-12-11 DIAGNOSIS — I69354 Hemiplegia and hemiparesis following cerebral infarction affecting left non-dominant side: Secondary | ICD-10-CM | POA: Insufficient documentation

## 2021-12-11 DIAGNOSIS — R41841 Cognitive communication deficit: Secondary | ICD-10-CM | POA: Insufficient documentation

## 2021-12-11 DIAGNOSIS — R278 Other lack of coordination: Secondary | ICD-10-CM | POA: Insufficient documentation

## 2021-12-11 DIAGNOSIS — M6281 Muscle weakness (generalized): Secondary | ICD-10-CM | POA: Diagnosis present

## 2021-12-11 NOTE — Therapy (Signed)
OUTPATIENT PHYSICAL THERAPY NEURO TREATMENT   Patient Name: Joanne Jackson MRN: [id number] DOB:Jul 01, 1975, 46 y.o., female Today's Date: 12/12/2021   PCP: Leamon Arnt, MD REFERRING PROVIDER: Ancil Boozer, FNP    PT End of Session - 12/12/21 0848     Visit Number 5    Number of Visits 7    Date for PT Re-Evaluation 12/19/21    Authorization Type [employer]    Authorization - Number of Visits 18   combined   PT Start Time 0806   pt late   PT Stop Time 0845    PT Time Calculation (min) 39 min    Activity Tolerance Patient tolerated treatment well    Behavior During Therapy [facility] for tasks assessed/performed;Flat affect                 Past Medical History:  Diagnosis Date   Brain cancer ([place])    Glioma ([place]) 05/18/2021   Hypertension    Pre-diabetes    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[place])    Past Surgical History:  Procedure Laterality Date   BRAIN SURGERY  05/2021   tumor removal   CESAREAN SECTION     DILATION AND CURETTAGE, DIAGNOSTIC / THERAPEUTIC     Patient Active Problem List   Diagnosis Date Noted   Moderate obstructive sleep apnea 10/31/2021   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   Dyslipidemia 04/30/2021   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[place]) 04/30/2021   Episode of transient neurologic symptoms 04/28/2021   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[place]) 10/25/2020   History of gestational diabetes 10/25/2020   Iron deficiency anemia 12/03/2019   Tubular adenoma of colon 10/24/2018   Essential hypertension 12/26/2017   Prediabetes 12/26/2017   Intramural leiomyoma of uterus 07/29/2015    ONSET DATE: 05/2021  REFERRING DIAG: C71.9 (ICD-10-CM) - Malignant neoplasm of brain, unspecified  THERAPY DIAG:  Muscle weakness (generalized)  Rationale for Evaluation and Treatment Rehabilitation  SUBJECTIVE:                                                                                                                                                                                               SUBJECTIVE STATEMENT: Everything is going good.  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 No  PRECAUTIONS: None  PATIENT GOALS improve strength in L side   OBJECTIVE:     TODAY'S TREATMENT: 12/12/21 Activity Comments  Treadmill walk at 1.7 mph x 4 min, followed by jog/walk intervals lasting 1 min at  3.5-3.7 mph and 1.5-1.7 mph respectively until a total of 10 min on treadmill  B UE support; intermittent cueing to pick up L foot to prevent heel dragging; difficulty maintaining jogging pace   L single leg bridge 2x10  Increased reliance on Ues and limited amplitude d/t weakness   Hooklying resisted march with red TB 2x20 Cueing to maintain B ankle DF  Tall plank 3x10" Edu on proper form and maintaining neutral posture  Plank on hands/knees + alt shoulder tap Tendency to compensate with Les and hips- instead focused on maintaining static plank  single leg RDLto elevated mat 10x each Cueing to maintain soft knee bent, keep hips square, and stand fully for each rep; more challenging on L LE  SLS + dumbbell pass 5# 10x each Heavy cueing to decrease speed and increase amplitude of arm movements for max challenge      HOME EXERCISE PROGRAM Last updated: 12/12/21 Access Code: [email or web address] URL: [email or web address] Date: 12/12/2021 Prepared by: [facility]  Exercises - Marching with Resistance  - 1 x daily - 5 x weekly - 2 sets - 10 reps - Standing Hamstring Curl with Resistance  - 1 x daily - 5 x weekly - 2 sets - 10 reps - Mini Squat with Counter Support  - 1 x daily - 5 x weekly - 2 sets - 10 reps - Sidelying Hip Abduction  - 1 x daily - 5 x weekly - 2 sets - 10 reps - Supine Bridge with Mini Swiss Ball Between Knees  - 1 x daily - 5 x  weekly - 2 sets - 10 reps - Forward Step Down  - 1 x daily - 5 x weekly - 2 sets - 10 reps - Bird Dog  - 1 x daily - 5 x weekly - 2 sets - 10 reps - Full Plank  - 1 x daily - 5 x weekly - 3-5 reps - 10 sec hold - Full Plank on Knees  - 1 x daily - 5 x weekly - 3-5 reps - 10 sec hold   PATIENT EDUCATION: Education details: HEP update Person educated: Patient Education method: Explanation, Demonstration, Tactile cues, Verbal cues, and Handouts Education comprehension: verbalized understanding and returned demonstration   Below measures were taken at time of initial evaluation unless otherwise specified:   DIAGNOSTIC FINDINGS: 04/29/21 brain MRI: No contrast enhancement at the site of signal abnormality in the right frontal lobe. This remains highly concerning for tumor, most likely a low-grade glioma  COGNITION: Overall cognitive status: Within functional limits for tasks assessed   SENSATION: WFL  COORDINATION: Alternating pronation/supination: WNL Alternating toe tap: WNL Finger to nose: WNL    POSTURE: No Significant postural limitations  LOWER EXTREMITY ROM:     Active  Right Eval Left Eval  Hip flexion    Hip extension    Hip abduction    Hip adduction    Hip internal rotation    Hip external rotation    Knee flexion    Knee extension    Ankle dorsiflexion 18 14  Ankle plantarflexion    Ankle inversion    Ankle eversion     (Blank rows = not tested)  LOWER EXTREMITY MMT:    MMT (in sitting) Right Eval Left Eval  Hip flexion 4+ 4  Hip extension    Hip abduction 4+ 3+  Hip adduction 4- 4-  Hip internal rotation    Hip external rotation  Knee flexion 4+ 4-  Knee extension 4+ 4-  Ankle dorsiflexion 4+ 4+  Ankle plantarflexion 4; 20 reps with compensations 4; 20 reps with compensations  Ankle inversion    Ankle eversion    (Blank rows = not tested)  GAIT: Gait pattern: WFL Assistive device utilized: None Level of assistance: Complete  Independence   FUNCTIONAL TESTs:  5 times sit to stand: 9.73 sec Functional gait assessment: 29      PATIENT EDUCATION: Education details: prognosis, POC, HEP Person educated: Patient Education method: Explanation, Demonstration, Tactile cues, Verbal cues, and Handouts Education comprehension: verbalized understanding and returned demonstration   HOME EXERCISE PROGRAM: Access Code: [email or web address] URL: [email or web address] Date: 11/07/2021 Prepared by: [facility]  Exercises - Marching with Resistance  - 1 x daily - 5 x weekly - 2 sets - 10 reps - Standing Hamstring Curl with Resistance  - 1 x daily - 5 x weekly - 2 sets - 10 reps - Mini Squat with Counter Support  - 1 x daily - 5 x weekly - 2 sets - 10 reps - Sidelying Hip Abduction  - 1 x daily - 5 x weekly - 2 sets - 10 reps - Supine Bridge with Mini Swiss Ball Between Knees  - 1 x daily - 5 x weekly - 2 sets - 10 reps    GOALS: Goals reviewed with patient? Yes  SHORT TERM GOALS: Target date: 11/28/2021  Patient to be independent with initial HEP. Baseline: HEP initiated Goal status: MET 11/14/21    LONG TERM GOALS: Target date: 12/19/2021  Patient to be independent with advanced HEP. Baseline: Not yet initiated  Goal status: IN PROGRESS  Patient to demonstrate B LE strength >/=4/5.  Baseline: See above Goal status: IN PROGRESS  Patient to demonstrate good running form and stability on ground or treadmill.  Baseline: NT Goal status: IN PROGRESS    ASSESSMENT:  CLINICAL IMPRESSION: Patient arrived to session without complaints. Worked on jogging form on treadmill with L heel still occasionally dragging- suspect d/t hip flexor weakness. Mat strengthening and stability activities focused on L LE bias and body weight as resistance. Patient required cueing for proper positioning and alignment. Ultimately limited by UE weakness in some of today's activities.  Still often requires cueing to demonstrate full amplitude motions and to prevent movements from running together. Patient reported understanding of HEP update and without complaints at end of session.    OBJECTIVE IMPAIRMENTS decreased strength.   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   REHAB POTENTIAL: Good  CLINICAL DECISION MAKING: Evolving/moderate complexity  EVALUATION COMPLEXITY: Moderate  PLAN: PT FREQUENCY: 1x/week  PT DURATION: 6 weeks  PLANNED INTERVENTIONS: Therapeutic exercises, Therapeutic activity, Neuromuscular re-education, Balance training, Gait training, Patient/Family education, Self Care, Joint mobilization, Joint manipulation, Stair training, Vestibular training, Canalith repositioning, Visual/preceptual remediation/compensation, Aquatic Therapy, Dry Needling, Electrical stimulation, Cryotherapy, Moist heat, Taping, Manual therapy, and Re-evaluation  PLAN FOR NEXT SESSION: assess goals; progress L LE strengthening, coordination, balance    Janene Harvey, PT, DPT 12/12/21 8:53 AM  [facility] at [facility] [address] [place], [place] [address] Phone # [phone] Fax # [phone]

## 2021-12-11 NOTE — Therapy (Signed)
OUTPATIENT SPEECH LANGUAGE PATHOLOGY TREATMENT SESSION   Patient Name: Joanne Jackson MRN: [id number] DOB:1975/07/30, 46 y.o., female Today's Date: 10/13/2021  PCP: Leamon Arnt., MD REFERRING PROVIDER: Kathyrn Drown, MD    End of Session - 12/11/21 1539     Visit Number 11    Number of Visits 25    Date for SLP Re-Evaluation 01/05/22    Authorization - Number of Visits 66    SLP Start Time 7342    SLP Stop Time  8768    SLP Time Calculation (min) 40 min    Activity Tolerance Patient tolerated treatment well                     Past Medical History:  Diagnosis Date   Brain cancer ([place])    Glioma ([place]) 05/18/2021   Hypertension    Pre-diabetes    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[place])    Past Surgical History:  Procedure Laterality Date   BRAIN SURGERY  05/2021   tumor removal   CESAREAN SECTION     DILATION AND CURETTAGE, DIAGNOSTIC / THERAPEUTIC     Patient Active Problem List   Diagnosis Date Noted   Moderate obstructive sleep apnea 10/31/2021   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   Dyslipidemia 04/30/2021   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[place]) 04/30/2021   Episode of transient neurologic symptoms 04/28/2021   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[place]) 10/25/2020   History of gestational diabetes 10/25/2020   Iron deficiency anemia 12/03/2019   Tubular adenoma of colon 10/24/2018   Essential hypertension 12/26/2017   Prediabetes 12/26/2017   Intramural leiomyoma of uterus 07/29/2015    ONSET DATE: 05/18/21   REFERRING DIAG: D49.6 (ICD-10-CM) - Neoplasm of unspecified behavior of brain   THERAPY DIAG:  Cognitive communication deficit  Rationale for Evaluation and Treatment Rehabilitation  SUBJECTIVE:   SUBJECTIVE STATEMENT: "I gave a 40% tip. I did not want to do that." Pt accompanied by: self  PERTINENT HISTORY: Patient presented to medical attention in March 2023 with new  onset seizures, characterized by shaking of the left hand without impaired awareness. CNS imaging demonstrated non enhancing mass within right frontal lobe, c/w primary neoplasm. She underwent craniotomy, resection on 05/18/21 with Dr. Tommi Rumps at [facility]; pathology was High Grade Glioma IDHmt. Since surgery, husband has noticed more blunted (depressed?) mood, less responsive and engaging. Otherwise she is fully independent and functional. Referred to [facility] from [facility] for local implementation of radiation and chemotherapy. Last day of IMRT and Temodar yesterday (07-31-21). Pt had HHST upon d/c from [facility].  PAIN:  Are you having pain? No  PATIENT GOALS  None stated today when SLP asked, due to decr'd intellectual awareness.   OBJECTIVE:  TODAY'S TREATMENT:  12/11/21: Pt accuracy with homework was 26/27 (96%). She did NOT double check her answers, which SLP again reminded pt to do prior to turning in homework. She said she thought about it last night but hsould have set a reminder to do it today. Alternating attention during session with checking work and conversation WNL.  SLP inquired about pt's meal prep as she is primary cook in the household. Pt told SLP she does some of the prep at lunchtime and then the rest at dinner - modification of the same meal with meat to accompany veggies and rice. Pt has not left stove on recently; When she did that the last time (>60 days ago), she noted bottom of pot was hot from  the stovetop and immediately shut the stove off.  11/29/21: SLP corrected pt homework with her - 46/51 success. Pt did NOT double check her answers - SLP reminded (again) to ALWAYS double check her answers. SLP inquired about if pt's decr'd attetion/attention to detail was problematic in the last two weeks and pt described a situation where she gave 40% tip due to distraction with how she put her card in card reader at hair salon. Pt was able to tell SLP how to change this situation next time without  cues.   11/16/21: Pt returned today because sleep a little better regulated as 3-4 hours/night. Pt will cont to undergo her chemo cycle until middle of next year and then plan will change. She agrees with SLP that the further she progresses away from last day of chemo, then gets worse when chemo cycle begins again. Pt is compensating with appropriate compensations for memory, continues to make errors rarely - pt gave example of email details, but always cc:'s husband to have second set of eyes in order to correct her if necessary. She is paying some bills with double checking/supervision from E, she is using alarms (for meds) and timers, and is taking brain breaks when necessary for as long as she can up to 20 minutes. Pt agreed with SLP that frequency could be x1 every other week with d/c in 2-3 visits, depending on pt status. SLP provided pt with some homework for detailed work and error awareness.   10/13/21: Pt returned PROM today; results below in "PROM". SLP and pt discussed whether or not pt should cont with ST in the next 2-3 weeks given so little sleep the last 7-8 days. Pt reports being very tired today and SLP believes this will impede pt's performance today and her progress in the future until her sleep gets better regulated. Pt agreed to cx September appointments to allow her sleep to get regulated better - a sleep study is currently undergoing a long approval process from pt's insurance, pt reports.  Today, SLP assisted pt in detailed functional written language tasks in which pt req'd min-mod cues occasionally for error awareness and for alternating attention.   10/05/21: Pt brought in written homework with many erase marks. "These were hard," pt stated. SLP gave pt "code" homework task which req'd her to alternate attention between response and stimuli. Pt stated she, eventually, used a straight edge as a guide and this was less difficult than no guide. Today she alternated attention WFL/WNL  between detailed written tasks with cues for attention to detail. When she double-checked her answers she found all but one of her mistakes. SLP had door open for mod noise and pt req'd cue for "brain break" but she did not take one until SLP cued a second time; "I was still working," pt stated. She took 3 minute break and went back to tasks. SLP provided pt and husband PROM for cognition and asked pt return these next session. [name], at end of session today indicated she needs to use compensations for deficits in memory, for attention, and for mental fatigue.  09/26/21: Pt reports she will begin Temodar again tonight. Has possible sleep study tomorrow night. Homework completed in full, 100% success. Pt reported using sticky notes successfully for recall of turning lights off and bringing her glasses downstairs. Today SLP used practical written math problems to target attending to details. Pt req'd consistent min A to note errors; Did not check work immediately (problem after problem), and  pt had 3/6 incorrect after her double checking. Pt noted to move very quickly with math and make errors due to decr'd alternating attention writing down all figures needed for accuracy. SLP encouraged pt to follow with her finger to incr accuracy, and to cross off figures when she has written them down to aid in alternating attention.  09/19/21: Pt entered with homework completed. [name] told SLP she was able to make brain breaks work with her daughter accompanying her with a timer and muted iPad. In detailed tasks pt today did not incorporate a strategy for written language comprehension so she made errors (three). She fixed two, but one took SLP verbal cue for written organization. Homework for written detailed task.  09/12/21: E expressed pt having difficulty with processing spoken information. SLP believes something to do with pt's attention level as well as with possible decr in auditory/overall processing. Pt brought her  homework - evidence pt completed without double-checking her answers, and did not slow down and look at the task prior to completing; Errors were due to incomplete procedures (pt did not look fully at the information provided and wrote down the first item she saw). Pt has not been using "brain breaks" when her daughter is present because her daughter follows her wherever she goes. SLP provided some suggestions in "pt instructions" for pt and husband to try at home. SLP provided homework for auditory attention/processing for husband to complete with pt.  09/06/21: Pt showed SLP her written homework. On her corrected copy she still had errors. SLP assisted pt in organizing her responses and saw approx 9 minutes into task that pt stopped completely. Pt endorsed mental fatigue so SLP asked pt to take a brain break for 3 minutes. Pt returned to task with better sustained/selective attention for remaining 6 minutes of task. SLP then educated and instructed pt on the importance of and examples of brain breaks when completing focused tasks. SLP asked pt what situations she has found herself that her brain needs rest from focusing and she said TV shows. SLP suggested stopping/pausing TV and doing mini-meditation session for 3-5 minutes and returning to task after brain break. SLP shared details of this session with pt's husband (with pt's permission).  08/29/21: CLQTwas completed today The Cognitive Linguistic Quick Test (CLQT) was administered to assess the relative status of five cognitive domains: attention, memory, language, executive functioning, and visuospatial skills. Scores from 10 tasks were used to estimate severity ratings (standardized for age groups 18-69 years and 70-89 years) for each domain, a clock drawing task, as well as an overall composite severity rating of cognition.      Task Score Criterion Cut Scores  Personal Facts 8/8 8  Symbol Cancellation 12/12 11  Confrontation Naming 10/10 10  Clock  Drawing  10/13 12  Story Retelling 8/10 6  Symbol Trails 7/10 9  Generative Naming 4/10 5  Design Memory 6/6 5  Mazes  4/8 7  Design Generation 8/13 6    Cognitive Domain Composite Score Severity Rating  Attention 181/215 WNL (low)  Memory 168/185 WNL  Executive Function 23/40 Mild  Language 30/37 WNL (low)  Visuospatial Skills 82/105 WNL (low)  Clock Drawing  10/13 Mild  Composite Severity Rating 3.8 WNL   Overall composite rating is "WNL" however on CLQT pt demonstrated decr'd skills in awareness, attention, executive function, and a lack of impulse control. Impulsivity decr'd pt's score specifically with clock drawing, mazes, and symbol trails.   (Date of eval): SLP discussed  evaluation results thus far, and Constant Therapy was introduced and explained.     PATIENT REPORTED OUTCOME MEASURES (PROM): Cognitive Function: E scored [name] 31/40, and [name] scored herself 32/40 (higher numbers indicate better QOL)    PATIENT EDUCATION: Education details: See "today's treatment" Person educated: Patient Education method: Explanation Education comprehension: verbalized understanding and needs further education   GOALS: Goals reviewed with patient? Yes  SHORT TERM GOALS:  Target date: 11/10/2021 (extended due to pt resuming ST in October)  Pt will demo [facility] selective attention with functional tasks in mod noisy environment for 20 minutes in 2 sessions  Baseline: 09/26/21 Goal status: met  2.  Pt will demo WFL alternating attention between billpay task and additional random tasks in 2 sessions  Baseline: 10/05/21 Goal status: not met  3.  Pt will acknowledge at least two areas of deficit during three therapy sessions  Baseline: 09/19/21, 09-26-21 Goal status: Met  4.  [name] will note 80% of errors in therapy tasks when given chance to self correct in 3 sessions Baseline: 09/19/21, 10-05-21 Goal status: not met  5.  [name] will reportedly demonstrate less frequent "absent-mindedness"  in the home (doors open, lights on, etc) than when compared to prior to OP ST, as reported by pt or E  Baseline:  Goal status: Met  LONG TERM GOALS: Target date: 01/05/2022  [name] will score better on E's and her own Cognitive PROM than during her first therapy session Baseline:  Goal status: Ongoing  2.  Pt will demo functional alternating attention with mod complex tasks in therapy session x3 and will reportedly demo functional alternating attention at home as reported by pt or by E, x3  Baseline: 11-16-21, 11-29-21, 12-11-21 Goal status: Met  3.  [name] will indicate anticipatory awareness during the session or at home as reported by pt or E, x5 Baseline: 11/16/21, 11-29-21 Goal status: Ongoing  4.  Pt will exhibit WFL organization and planning by paying bills independently Baseline: 11/16/21 Goal status: Ongoing   ASSESSMENT:  CLINICAL IMPRESSION: Patient is a 46 y.o. female who was seen today for cognitive communication tx. SEE THERAPY NOTE. [name] requires improved cognition for responsibilities at home, and is using compensatory strategies appropriately.    OBJECTIVE IMPAIRMENTS include attention, memory, awareness, and executive functioning. These impairments are limiting patient from return to work, managing medications, managing appointments, managing finances, household responsibilities, and effectively communicating at home and in community. Factors affecting potential to achieve goals and functional outcome are ability to learn/carryover information and co-morbidities.. Patient will benefit from skilled SLP services to address above impairments and improve overall function.  REHAB POTENTIAL: Good  PLAN: SLP FREQUENCY: 2x/week  SLP DURATION: 12 weeks  PLANNED INTERVENTIONS: Language facilitation, Environmental controls, Cueing hierachy, Cognitive reorganization, Internal/external aids, Functional tasks, SLP instruction and feedback, and Compensatory  strategies    Raegen Tarpley, CCC-SLP 12/11/2021, 3:40 PM

## 2021-12-12 ENCOUNTER — Encounter: Payer: Self-pay | Admitting: Physical Therapy

## 2021-12-12 ENCOUNTER — Ambulatory Visit: Payer: 59 | Admitting: Physical Therapy

## 2021-12-12 ENCOUNTER — Ambulatory Visit: Payer: 59 | Admitting: Occupational Therapy

## 2021-12-12 ENCOUNTER — Ambulatory Visit: Payer: 59 | Admitting: Internal Medicine

## 2021-12-12 DIAGNOSIS — R41841 Cognitive communication deficit: Secondary | ICD-10-CM | POA: Diagnosis not present

## 2021-12-12 DIAGNOSIS — I69354 Hemiplegia and hemiparesis following cerebral infarction affecting left non-dominant side: Secondary | ICD-10-CM

## 2021-12-12 DIAGNOSIS — M6281 Muscle weakness (generalized): Secondary | ICD-10-CM

## 2021-12-12 DIAGNOSIS — R278 Other lack of coordination: Secondary | ICD-10-CM

## 2021-12-12 NOTE — Therapy (Signed)
OUTPATIENT OCCUPATIONAL THERAPY Treatment Note  Patient Name: Addasyn Mcbreen MRN: [id number] DOB:07-19-1975, 46 y.o., female Today's Date: 12/12/2021  PCP: Leamon Arnt, MD REFERRING PROVIDER: Ancil Boozer, [place]    OT End of Session - 12/12/21 0934     Visit Number 2    Number of Visits 7    Date for OT Re-Evaluation 01/12/22    Authorization Type [employer]    OT Start Time 0933    OT Stop Time 5852    OT Time Calculation (min) 42 min              Past Medical History:  Diagnosis Date   Brain cancer ([place])    Glioma ([place]) 05/18/2021   Hypertension    Pre-diabetes    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[place])    Past Surgical History:  Procedure Laterality Date   BRAIN SURGERY  05/2021   tumor removal   CESAREAN SECTION     DILATION AND CURETTAGE, DIAGNOSTIC / THERAPEUTIC     Patient Active Problem List   Diagnosis Date Noted   Moderate obstructive sleep apnea 10/31/2021   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   Dyslipidemia 04/30/2021   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[place]) 04/30/2021   Episode of transient neurologic symptoms 04/28/2021   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[place]) 10/25/2020   History of gestational diabetes 10/25/2020   Iron deficiency anemia 12/03/2019   Tubular adenoma of colon 10/24/2018   Essential hypertension 12/26/2017   Prediabetes 12/26/2017   Intramural leiomyoma of uterus 07/29/2015    ONSET DATE: referral date 11/08/21  REFERRING DIAG: C71.9 (ICD-10-CM) - Malignant neoplasm of brain, unspecified R53.1 (ICD-10-CM) - Weakness   THERAPY DIAG:  Hemiplegia and hemiparesis following cerebral infarction affecting left non-dominant side (HCC)  Other lack of coordination  Muscle weakness (generalized)  Rationale for Evaluation and Treatment Rehabilitation  SUBJECTIVE:   SUBJECTIVE STATEMENT: Pt reports mild discomfort in LUE after functional reaching. Pt accompanied  by: self (husband drove pt to appt)  PERTINENT HISTORY: R frontal glioma s/p resection 05/2021 and chemo/radiation, HTN, sjogren's   PRECAUTIONS: None  WEIGHT BEARING RESTRICTIONS: No  PAIN:  Are you having pain? No  FALLS: Has patient fallen in last 6 months? 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  OBJECTIVE:   HAND DOMINANCE: Right   UPPER EXTREMITY MMT:     MMT Right eval Left eval  Shoulder flexion 4+/5 4/5  Shoulder abduction    Shoulder adduction    Shoulder extension    Shoulder internal rotation    Shoulder external rotation    Middle trapezius    Lower trapezius    Elbow flexion 4+ 4  Elbow extension 4+ 4  Wrist flexion    Wrist extension    Wrist ulnar deviation    Wrist radial deviation    Wrist pronation    Wrist supination    (Blank rows = not tested)  HAND FUNCTION: Grip strength: Right: 42 lbs; Left: 25 lbs, Lateral pinch: Right: 17 lbs, Left: 12 lbs, and 3 point pinch: Right: 13 lbs, Left: 8 lbs  COORDINATION: Finger Nose Finger test: Symmetrical bilaterally, completed 11 in 10 secs on R, 10 in 10 secs on L 9 Hole Peg test: Right: 21.25  sec; Left: 32.57 sec Box and Blocks:  Right 56 blocks, Left 45 blocks    TODAY'S TREATMENT:  12/12/21 Resistance Clothespins: 2,4,6# with LUE for mid functional reaching and sustained pinch. Pt req'd min  cues for movement pattern and maintaining good positioning of LUE with reach.  OT increased challenge with addition of 8# resistance clothespin with good success.  OT challenged pt in various pinch patterns during removal of clothespins.  Required sit > partial stand to place clothespins at higher range, no c/o pain in shoulder with increased height.   Wall slides:  Shoulder flexion/extension, diagonals, and horizontal arc movements x10 each.  OT providing demonstration, min cues for technique, and cues to hold position at end range for 1-2 secs for sustained hold as pt initially completing tasks quickly. [place]: completing tangram puzzle with focus on picking up pieces, not sliding, and placing in matching spaces.  Pt demonstrating good motor control, requiring min cue to locate correct shape x1.  Lacing pattern and threading beads with focus on BUE use.  Pt holding lacing pattern in R hand while threading with L hand.  Pt then threading beads with LUE after picking up 5 pegs one at a time and placing in palm to then translate to finger tips to thread.  OT reiterated use of LUE during functional tasks and play activities with daughter.   11/29/21 Engaged in fine motor coordination education with focus on picking up variety of small items one at a time and placing in container, picking up coins and stacking, picking up coins and placing in coin slot, picking up coins and translating finger tips > palm > finger tips to place on table or in container.  Educated on additional coordination tasks with stringing beads, threading nuts/bolts, and provided with handout.  PATIENT EDUCATION: Education details: Educated on role and purpose of OT as well as potential interventions and goals for therapy based on initial evaluation findings. Person educated: Patient Education method: Explanation, Demonstration, and Handouts Education comprehension: verbalized understanding and needs further education  HOME EXERCISE PROGRAM: Fine motor coordination handout provided  Access Code: [email or web address] URL: [email or web address] Date: 12/12/2021 Prepared by: [facility]  Exercises - Shoulder Flexion Wall Slide with Towel  - 1 x daily - 2 sets - 10 reps - Shoulder Scaption Wall Slide with Towel  - 1 x daily - 2 sets - 10  reps   GOALS: Goals reviewed with patient? Yes  SHORT TERM GOALS: Target date: 12/22/21    Status:  1 Pt will demonstrate improved fine motor coordination for ADLs as evidenced by decreasing 9 hole peg test score for LUE by 4 secs Baseline: Right: 21.25 sec; Left: 32.57 sec Progressing  2 Pt will demonstrate improved UE functional use for ADLs as evidenced by increasing box/ blocks score by 3 blocks with LUE Baseline: Right 56 blocks, Left 45 blocks Progressing  3 Pt will be independent in strengthening and coordination HEP. Baseline:  Progressing    LONG TERM GOALS: Target date: 01/12/22    Status:  1 Pt will demonstrate improved fine motor coordination for ADLs as evidenced by decreasing 9 hole peg test score for LUE by 7 secs Baseline: Right: 21.25 sec; Left: 32.57 sec Progressing  2 Pt will demonstrate improved UE functional use for ADLs as evidenced by increasing box/ blocks score by 6 blocks with LUE Baseline: Right 56 blocks, Left 45 blocks Progressing  3 Pt will demonstrate improved LUE strength as needed  to engage in ADLs and IADLs Baseline:  Progressing  4 Pt will demonstrate improved functional reach in LUE to obtain moderate weight items at moderate range with LUE and/or BUE and transport to another surface as needed to engage in ADLs and IADLs Baseline:  Progressing  5 Pt will verbalize understanding of energy conservation strategies to increase independence and safety with ADLs and IADLs. Baseline:  Progressing    ASSESSMENT:  CLINICAL IMPRESSION: Pt present for first treatment session s/p initial evaluation.  OT reiterated POC and discussed established goals, with pt in agreement.  Pt demonstrating good tolerance to ROM, strengthening, and coordination activities this session.  OT provided recommendations and suggestions for tasks with BUE and just LUE.  Reiterated use of LUE during functional tasks and play activities with daughter.  PERFORMANCE DEFICITS: in  functional skills including ADLs, IADLs, coordination, ROM, strength, FMC, GMC, balance, endurance, decreased knowledge of precautions, decreased knowledge of use of DME, and UE functional use.   IMPAIRMENTS: are limiting patient from ADLs, IADLs, and work.   CO-MORBIDITIES; may have co-morbidities  that affects occupational performance. Patient will benefit from skilled OT to address above impairments and improve overall function.  MODIFICATION OR ASSISTANCE TO COMPLETE EVALUATION: No modification of tasks or assist necessary to complete an evaluation.  OT OCCUPATIONAL PROFILE AND HISTORY: Detailed assessment: Review of records and additional review of physical, cognitive, psychosocial history related to current functional performance.  CLINICAL DECISION MAKING: LOW - limited treatment options, no task modification necessary  REHAB POTENTIAL: Good  EVALUATION COMPLEXITY: Low    PLAN:  OT FREQUENCY: 1x/week  OT DURATION: 6 weeks  PLANNED INTERVENTIONS: self care/ADL training, therapeutic exercise, therapeutic activity, neuromuscular re-education, balance training, functional mobility training, aquatic therapy, patient/family education, psychosocial skills training, energy conservation, coping strategies training, and DME and/or AE instructions  RECOMMENDED OTHER SERVICES: N/A  CONSULTED AND AGREED WITH PLAN OF CARE: Patient  PLAN FOR NEXT SESSION: Engage in functional reaching task, Initiate strengthening HEP for UE and hand, review Coordination HEP   Harpreet Pompey, [place], OTR/L 12/12/2021, 9:34 AM

## 2021-12-13 ENCOUNTER — Ambulatory Visit: Payer: 59 | Admitting: Obstetrics & Gynecology

## 2021-12-13 NOTE — Therapy (Signed)
OUTPATIENT PHYSICAL THERAPY NEURO PROGRESS NOTE & RE-CERTIFICATION   Patient Name: Joanne Jackson MRN: [id number] DOB:1975/06/24, 46 y.o., female Today's Date: 12/15/2021   PCP: Leamon Arnt, MD REFERRING PROVIDER: Ancil Boozer, FNP    PT End of Session - 12/15/21 1024     Visit Number 6    Number of Visits 10    Date for PT Re-Evaluation 01/12/22    Authorization Type [employer]    Authorization - Number of Visits 60   combined   PT Start Time 0938    PT Stop Time 1020    PT Time Calculation (min) 42 min    Activity Tolerance Patient tolerated treatment well    Behavior During Therapy [facility] for tasks assessed/performed                  Past Medical History:  Diagnosis Date   Brain cancer ([place])    Glioma ([place]) 05/18/2021   Hypertension    Pre-diabetes    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[place])    Past Surgical History:  Procedure Laterality Date   BRAIN SURGERY  05/2021   tumor removal   CESAREAN SECTION     DILATION AND CURETTAGE, DIAGNOSTIC / THERAPEUTIC     Patient Active Problem List   Diagnosis Date Noted   Moderate obstructive sleep apnea 10/31/2021   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   Dyslipidemia 04/30/2021   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[place]) 04/30/2021   Episode of transient neurologic symptoms 04/28/2021   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[place]) 10/25/2020   History of gestational diabetes 10/25/2020   Iron deficiency anemia 12/03/2019   Tubular adenoma of colon 10/24/2018   Essential hypertension 12/26/2017   Prediabetes 12/26/2017   Intramural leiomyoma of uterus 07/29/2015    ONSET DATE: 05/2021  REFERRING DIAG: C71.9 (ICD-10-CM) - Malignant neoplasm of brain, unspecified  THERAPY DIAG:  Muscle weakness (generalized)  Rationale for Evaluation and Treatment Rehabilitation  SUBJECTIVE:                                                                                                                                                                                               SUBJECTIVE STATEMENT: Nothing is new. Reports that she still runs on treadmill or outside daily. Working on her HEP 2-3x/week. Denies any particular challenge with ADLs and reports that she feels that physically she would be ready to return to work.  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 No  PRECAUTIONS: None  PATIENT  GOALS improve strength in L side   OBJECTIVE:    TODAY'S TREATMENT: 12/15/21   LOWER EXTREMITY MMT:    MMT (in sitting) Right Eval Left Eval Right 12/15/21 Left 12/15/21  Hip flexion 4+ 4 4+ 4+  Hip extension   4+ 4  Hip abduction 4+ 3+ 4+ 4  Hip adduction 4- 4- 4- 4-  Hip internal rotation      Hip external rotation      Knee flexion 4+ 4- 4+ 4-  Knee extension 4+ 4- 4+ 4-  Ankle dorsiflexion 4+ 4+ 5 4+  Ankle plantarflexion 4; 20 reps with compensations 4; 20 reps with compensations 5 4+ (20 reps with minor compensations)  Ankle inversion      Ankle eversion      (Blank rows = not tested) *MMT on 12/15/21 performed in standard positioning, not sitting   Activity Comments  R/L runner's stretch for gastroc 30" each   Treadmill walk at 1.7 mph x 3 min, followed by jog/walk intervals lasting 1 min at 3.5 mph and 1.7 mph respectively until a total of 7 min on treadmill  L heel catching with gait- cueing for knee extension and heel-toe pattern; similar pattern with jogging   Squat to mat 10x Cues to look straight ahead   L standing HS curl 2x10 5# Cueing for slow eccentric phase           HOME EXERCISE PROGRAM Last updated: 12/15/21 Access Code: [email or web address] URL: [email or web address] Date: 12/15/2021 Prepared by: [facility]  Exercises - Standing Hamstring Curl with Resistance  - 1 x daily - 5  x weekly - 2 sets - 10 reps - Forward Step Down  - 1 x daily - 5 x weekly - 2 sets - 10 reps - Squat with Chair Touch  - 1 x daily - 5 x weekly - 2 sets - 10 reps - Sidelying Hip Abduction  - 1 x daily - 5 x weekly - 2 sets - 10 reps - Sidelying Hip Adduction  - 1 x daily - 5 x weekly - 2 sets - 10 reps - Prone Hip Extension  - 1 x daily - 5 x weekly - 2 sets - 10 reps - Bird Dog  - 1 x daily - 5 x weekly - 2 sets - 10 reps - Full Plank  - 1 x daily - 5 x weekly - 3-5 reps - 10 sec hold - Full Plank on Knees  - 1 x daily - 5 x weekly - 3-5 reps - 10 sec hold    PATIENT EDUCATION: Education details: edu on progress towards goals and remaining impairments; HEP update, POC; encouraged adjustable ankle weights to progress exercises at home  Person educated: Patient Education method: Explanation, Demonstration, Tactile cues, Verbal cues, and Handouts Education comprehension: verbalized understanding and returned demonstration    Below measures were taken at time of initial evaluation unless otherwise specified:   DIAGNOSTIC FINDINGS: 04/29/21 brain MRI: No contrast enhancement at the site of signal abnormality in the right frontal lobe. This remains highly concerning for tumor, most likely a low-grade glioma  COGNITION: Overall cognitive status: Within functional limits for tasks assessed   SENSATION: WFL  COORDINATION: Alternating pronation/supination: WNL Alternating toe tap: WNL Finger to nose: WNL    POSTURE: No Significant postural limitations  LOWER EXTREMITY ROM:     Active  Right Eval Left Eval  Hip flexion    Hip extension  Hip abduction    Hip adduction    Hip internal rotation    Hip external rotation    Knee flexion    Knee extension    Ankle dorsiflexion 18 14  Ankle plantarflexion    Ankle inversion    Ankle eversion     (Blank rows = not tested)  LOWER EXTREMITY MMT:    MMT (in sitting) Right Eval Left Eval  Hip flexion 4+ 4  Hip extension     Hip abduction 4+ 3+  Hip adduction 4- 4-  Hip internal rotation    Hip external rotation    Knee flexion 4+ 4-  Knee extension 4+ 4-  Ankle dorsiflexion 4+ 4+  Ankle plantarflexion 4; 20 reps with compensations 4; 20 reps with compensations  Ankle inversion    Ankle eversion    (Blank rows = not tested)  GAIT: Gait pattern: WFL Assistive device utilized: None Level of assistance: Complete Independence   FUNCTIONAL TESTs:  5 times sit to stand: 9.73 sec Functional gait assessment: 29      PATIENT EDUCATION: Education details: prognosis, POC, HEP Person educated: Patient Education method: Explanation, Demonstration, Tactile cues, Verbal cues, and Handouts Education comprehension: verbalized understanding and returned demonstration   HOME EXERCISE PROGRAM: Access Code: [email or web address] URL: [email or web address] Date: 11/07/2021 Prepared by: [facility]  Exercises - Marching with Resistance  - 1 x daily - 5 x weekly - 2 sets - 10 reps - Standing Hamstring Curl with Resistance  - 1 x daily - 5 x weekly - 2 sets - 10 reps - Mini Squat with Counter Support  - 1 x daily - 5 x weekly - 2 sets - 10 reps - Sidelying Hip Abduction  - 1 x daily - 5 x weekly - 2 sets - 10 reps - Supine Bridge with Mini Swiss Ball Between Knees  - 1 x daily - 5 x weekly - 2 sets - 10 reps    GOALS: Goals reviewed with patient? Yes  SHORT TERM GOALS: Target date: 11/28/2021  Patient to be independent with initial HEP. Baseline: HEP initiated Goal status: MET 11/14/21    LONG TERM GOALS: Target date: 01/12/2022  Patient to be independent with advanced HEP. Baseline: Not yet initiated; reported compliance and understanding 12/15/21 Goal status: IN PROGRESS 12/15/21  Patient to demonstrate B LE strength >/=4/5.  Baseline: See above; improving, see above 12/15/21 Goal status: IN PROGRESS 12/15/21  Patient to demonstrate good running form  and stability on ground or treadmill.  Baseline: NT; reduced L step length on L foot 12/15/21 Goal status: IN PROGRESS 12/15/21    ASSESSMENT:  CLINICAL IMPRESSION: Patient reports compliance with HEP and consistently running for fitness at home. Reports no challenges with ADLs at this time. Strength testing revealed improvement in L hip flexion, abduction, and PF strength. Still remaining weakness evident in L hip and knee, which is also apparent in patient's gait and running pattern. Worked on Conservation officer, nature for max challenge and benefit. Patient is demonstrating slow but steady progress towards goals. Would benefit from additional skilled PT services 1x/week for 4 weeks to address remaining LE weakness and improve gait/running mechanics.    OBJECTIVE IMPAIRMENTS decreased strength.   ACTIVITY LIMITATIONS carrying, lifting, bending, and stairs  PARTICIPATION LIMITATIONS: cleaning, laundry, and yard work  PERSONAL FACTORS Age, Past/current experiences, Time since onset of injury/illness/exacerbation, and 3+ comorbidities: R frontal glioma s/p resection 05/2021 and chemo/radiation, HTN, sjogren's  are also  affecting patient's functional outcome.   REHAB POTENTIAL: Good  CLINICAL DECISION MAKING: Evolving/moderate complexity  EVALUATION COMPLEXITY: Moderate  PLAN: PT FREQUENCY: 1x/week  PT DURATION: 4 weeks  PLANNED INTERVENTIONS: Therapeutic exercises, Therapeutic activity, Neuromuscular re-education, Balance training, Gait training, Patient/Family education, Self Care, Joint mobilization, Joint manipulation, Stair training, Vestibular training, Canalith repositioning, Visual/preceptual remediation/compensation, Aquatic Therapy, Dry Needling, Electrical stimulation, Cryotherapy, Moist heat, Taping, Manual therapy, and Re-evaluation  PLAN FOR NEXT SESSION:  progress L LE strengthening, increasing step length on L; coordination, balance    Janene Harvey, PT, DPT 12/15/21 10:33  AM  [facility] at [facility] [address] [place], [place] [address] Phone # [phone] Fax # [phone]

## 2021-12-14 ENCOUNTER — Ambulatory Visit
Admission: RE | Admit: 2021-12-14 | Discharge: 2021-12-14 | Disposition: A | Discharge status: Home / Self Care | Payer: Self-pay | Source: Ambulatory Visit | Attending: Internal Medicine | Admitting: Internal Medicine

## 2021-12-14 ENCOUNTER — Encounter: Payer: Self-pay | Admitting: Internal Medicine

## 2021-12-14 ENCOUNTER — Inpatient Hospital Stay: Payer: 59

## 2021-12-14 ENCOUNTER — Inpatient Hospital Stay: Payer: 59 | Attending: Radiation Oncology | Admitting: Internal Medicine

## 2021-12-14 VITALS — BP 129/88 | HR 67 | Temp 98.1°F | Resp 15 | Wt 150.0 lb

## 2021-12-14 DIAGNOSIS — C719 Malignant neoplasm of brain, unspecified: Secondary | ICD-10-CM | POA: Diagnosis not present

## 2021-12-14 DIAGNOSIS — M3500 Sicca syndrome, unspecified: Secondary | ICD-10-CM | POA: Insufficient documentation

## 2021-12-14 DIAGNOSIS — C711 Malignant neoplasm of frontal lobe: Secondary | ICD-10-CM | POA: Diagnosis present

## 2021-12-14 DIAGNOSIS — R251 Tremor, unspecified: Secondary | ICD-10-CM | POA: Insufficient documentation

## 2021-12-14 DIAGNOSIS — Z8349 Family history of other endocrine, nutritional and metabolic diseases: Secondary | ICD-10-CM | POA: Insufficient documentation

## 2021-12-14 DIAGNOSIS — Z79899 Other long term (current) drug therapy: Secondary | ICD-10-CM | POA: Diagnosis not present

## 2021-12-14 DIAGNOSIS — R112 Nausea with vomiting, unspecified: Secondary | ICD-10-CM | POA: Diagnosis not present

## 2021-12-14 DIAGNOSIS — I10 Essential (primary) hypertension: Secondary | ICD-10-CM | POA: Insufficient documentation

## 2021-12-14 DIAGNOSIS — Z79630 Long term (current) use of alkylating agent: Secondary | ICD-10-CM | POA: Insufficient documentation

## 2021-12-14 DIAGNOSIS — Z8249 Family history of ischemic heart disease and other diseases of the circulatory system: Secondary | ICD-10-CM | POA: Diagnosis not present

## 2021-12-14 LAB — CBC WITH DIFFERENTIAL (CANCER CENTER ONLY)
Abs Immature Granulocytes: 0.01 10*3/uL (ref 0.00–0.07)
Basophils Absolute: 0 10*3/uL (ref 0.0–0.1)
Basophils Relative: 0 %
Eosinophils Absolute: 0.1 10*3/uL (ref 0.0–0.5)
Eosinophils Relative: 3 %
HCT: 38.4 % (ref 36.0–46.0)
Hemoglobin: 13.3 g/dL (ref 12.0–15.0)
Immature Granulocytes: 0 %
Lymphocytes Relative: 16 %
Lymphs Abs: 0.5 10*3/uL — ABNORMAL LOW (ref 0.7–4.0)
MCH: 30.5 pg (ref 26.0–34.0)
MCHC: 34.6 g/dL (ref 30.0–36.0)
MCV: 88.1 fL (ref 80.0–100.0)
Monocytes Absolute: 0.3 10*3/uL (ref 0.1–1.0)
Monocytes Relative: 9 %
Neutro Abs: 2.1 10*3/uL (ref 1.7–7.7)
Neutrophils Relative %: 72 %
Platelet Count: 190 10*3/uL (ref 150–400)
RBC: 4.36 MIL/uL (ref 3.87–5.11)
RDW: 11.9 % (ref 11.5–15.5)
WBC Count: 2.9 10*3/uL — ABNORMAL LOW (ref 4.0–10.5)
nRBC: 0 % (ref 0.0–0.2)

## 2021-12-14 LAB — CMP (CANCER CENTER ONLY)
ALT: 14 U/L (ref 0–44)
AST: 15 U/L (ref 15–41)
Albumin: 4.2 g/dL (ref 3.5–5.0)
Alkaline Phosphatase: 55 U/L (ref 38–126)
Anion gap: 6 (ref 5–15)
BUN: 13 mg/dL (ref 6–20)
CO2: 27 mmol/L (ref 22–32)
Calcium: 9.3 mg/dL (ref 8.9–10.3)
Chloride: 104 mmol/L (ref 98–111)
Creatinine: 0.58 mg/dL (ref 0.44–1.00)
GFR, Estimated: >60 mL/min (ref >60)
Glucose, Bld: 94 mg/dL (ref 70–99)
Potassium: 3.8 mmol/L (ref 3.5–5.1)
Sodium: 137 mmol/L (ref 135–145)
Total Bilirubin: 0.5 mg/dL (ref 0.3–1.2)
Total Protein: 7 g/dL (ref 6.5–8.1)

## 2021-12-14 NOTE — Progress Notes (Signed)
Requested MRI Brain w wo contrast that was completed on 12/13/2021 from [facility] radiology.

## 2021-12-14 NOTE — Progress Notes (Signed)
[facility] [place], [place] [address] [phone]   Interval Evaluation  Date of Service: 12/14/21 Patient Name: Joanne Jackson Patient MRN: [id number] Patient DOB: [birth year] Provider: Ventura Sellers, MD  Identifying Statement:  Joanne Jackson is a 46 y.o. female with right frontal  high grade glioma, IDH mt    Oncologic History: Oncology History  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[place])  05/18/2021 Surgery   Right frontal craniotomy, resection with Dr. Tommi Rumps at [facility]; path demonstrates High Grade Glioma IDH-55m.   06/19/2021 - 06/19/2021 Chemotherapy   Patient is on Treatment Plan : BRAIN GLIOBLASTOMA Radiation Therapy With Concurrent Temozolomide 75 mg/m2 Daily Followed By Sequential Maintenance Temozolomide x 6-12 cycles     08/28/2021 -  Chemotherapy   Patient is on Treatment Plan : BRAIN GLIOBLASTOMA Consolidation Temozolomide Days 1-5 q28 Days        Biomarkers:  MGMT Unknown.  IDH 1/2 Mutated.  EGFR Unknown  TERT Unmutated   Interval History: Joanne Jackson today for follow up, now having completed cycle #4 adjuvant Temodar.  Tolerated dose reduction better this cycle.  Sleep is better on melatonin and trazodone. She has not been using her CPAP mask due to discomfort.  [facility]  H+P (06/12/21) Patient presented to medical attention in March 2023 with new onset seizures, characterized by shaking of the left hand without impaired awareness.  CNS imaging demonstrated non enhancing mass within right frontal lobe, c/w primary neoplasm.  She underwent craniotomy, [facility] 05[facility]18[facility]21 [facility] High Grade Glioma IDHmt.  Since surgery, husband has noticed more blunted (depressed?) mood, less responsive and engaging.  Otherwise she is fully independent and functional.  [facility]  Medications: Current Outpatient Medications on File Prior to Visit  Medication Sig Dispense Refill   Cholecalciferol (VITAMIN D3) 50 MCG (2000 UT) TABS Take 2,000 Units by mouth daily as needed.      docusate sodium (COLACE) 100 MG capsule Take 1 capsule (100 mg total) by mouth 2 (two) times daily. 10 capsule 0   ferrous sulfate 324 (65 Fe) MG TBEC TAKE 1 TABLET BY MOUTH EVERY DAY (Patient taking differently: Take 324 mg by mouth daily.) 90 tablet 1   levETIRAcetam (KEPPRA) 500 MG tablet TAKE 1 TABLET BY MOUTH TWICE A DAY 180 tablet 3   lisinopril (ZESTRIL) 10 MG tablet Take 1 tablet (10 mg total) by mouth daily. 90 tablet 3   metoCLOPramide (REGLAN) 10 MG tablet Take 1 tablet (10 mg total) by mouth every 8 (eight) hours as needed for up to 12 doses for nausea. 12 tablet 0   omeprazole (PRILOSEC) 20 MG capsule Take 20 mg by mouth daily.     ondansetron (ZOFRAN) 8 MG tablet TAKE 1 TABLET BY MOUTH 2 TIMES DAILY AS NEEDED FOR NAUSEA AND VOMITING. MAY TAKE 30-60 MINUTES PRIOR TO TEMODAR ADMINISTRATION IF NAUSEA/VOMITING OCCURS. 30 tablet 1   RESTASIS 0.05 % ophthalmic emulsion Place 1 drop into both eyes 2 (two) times daily.     Suvorexant (BELSOMRA) 20 MG TABS 1 at bedtime for sleep (Patient not taking: Reported on 11/16/2021) 30 tablet 1   temozolomide (TEMODAR) 140 MG capsule TAKE 2 CAPSULES BY MOUTH ONCE DAILY ON DAYS 1 TO 5 OF 28 DAY CYCLE. TAKE ON EMPTY STOMACH. TOTAL DAILY DOSE IS 280MG 10 capsule  0   temozolomide (TEMODAR) 140 MG capsule TAKE 2 CAPSULES BY MOUTH ONCE DAILY ON DAYS 1 TO 5 OF 28 DAY CYCLE. TAKE ON EMPTY STOMACH. TOTAL DAILY DOSE IS 280MG 10 capsule 0   traZODone (DESYREL) 100 MG tablet Take 100 mg by mouth at bedtime.     No current facility-administered medications on file prior to visit.    Allergies: No Known Allergies Past Medical History:  Past Medical History:  Diagnosis Date   Brain cancer ([place])    Glioma ([place]) 05/18/2021   Hypertension     Pre-diabetes    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HCC)    Past Surgical History:  Past Surgical History:  Procedure Laterality Date   BRAIN SURGERY  05/2021   tumor removal   CESAREAN SECTION     DILATION AND CURETTAGE, DIAGNOSTIC / THERAPEUTIC     Social History:  Social History   Socioeconomic History   Marital status: Married    Spouse name: Not on file   Number of children: 1   Years of education: Not on file   Highest education level: Not on file  Occupational History   Occupation: Accounting  Tobacco Use   Smoking status: Never    Passive exposure: Never   Smokeless tobacco: Never  Vaping Use   Vaping Use: Never used  Substance and Sexual Activity   Alcohol use: Not Currently   Drug use: Never   Sexual activity: Yes    Partners: Male    Birth control/protection: Other-see comments, Condom    Comment: husband is infertile  Other Topics Concern   Not on file  Social History Narrative   Not on file   Social Determinants of Health   Financial Resource Strain: Not on file  Food Insecurity: Not on file  Transportation Needs: Not on file  Physical Activity: Not on file  Stress: Not on file  Social Connections: Not on file  Intimate Partner Violence: Not on file   Family History:  Family History  Problem Relation Age of Onset   Thyroid disease Mother    Heart attack Father 31   Hypertension Father    Healthy Brother    Healthy Paternal Grandfather    Healthy Daughter     Review of Systems: Constitutional: Doesn't report fevers, chills or abnormal weight loss Eyes: Doesn't report blurriness of vision Ears, nose, mouth, throat, and face: Doesn't report sore throat Respiratory: Doesn't report cough, dyspnea or wheezes Cardiovascular: Doesn't report palpitation, chest discomfort  Gastrointestinal:  Doesn't report nausea, constipation, diarrhea GU: Doesn't report incontinence Skin: Doesn't report skin rashes Neurological: Per  HPI Musculoskeletal: Doesn't report joint pain Behavioral/Psych: Doesn't report anxiety  Physical Exam: Vitals:   12/14/21 1133  BP: 129/88  Pulse: 67  Resp: 15  Temp: 98.1 F (36.7 C)  SpO2: 97%     KPS: 90. General: Alert, cooperative, pleasant, in no acute distress Head: Normal EENT: No conjunctival injection or scleral icterus.  Lungs: Resp effort normal Cardiac: Regular rate Abdomen: Non-distended abdomen Skin: No rashes cyanosis or petechiae. Extremities: No clubbing or edema  Neurologic Exam: Mental Status: Awake, alert, attentive to examiner. Oriented to self and environment. Language is fluent with intact comprehension. Abulic features noted. Cranial Nerves: Visual acuity is grossly normal. Visual fields are full. Extra-ocular movements intact. No ptosis. Face is symmetric Motor: Tone and bulk are normal. Power is full in both arms and legs. Reflexes are symmetric, no pathologic reflexes present.  Sensory: Intact to light touch Gait:  Normal.   Labs: I have reviewed the data as listed    Component Value Date/Time   NA 136 11/16/2021 1202   K 4.0 11/16/2021 1202   CL 103 11/16/2021 1202   CO2 29 11/16/2021 1202   GLUCOSE 110 (H) 11/16/2021 1202   BUN 15 11/16/2021 1202   CREATININE 0.54 11/16/2021 1202   CREATININE 0.50 11/24/2019 1449   CALCIUM 9.3 11/16/2021 1202   PROT 7.2 11/16/2021 1202   ALBUMIN 4.2 11/16/2021 1202   AST 12 (L) 11/16/2021 1202   ALT 12 11/16/2021 1202   ALKPHOS 55 11/16/2021 1202   BILITOT 0.8 11/16/2021 1202   GFRNONAA >60 11/16/2021 1202   GFRNONAA 119 11/24/2019 1449   GFRAA 138 11/24/2019 1449   Lab Results  Component Value Date   WBC 2.9 (L) 12/14/2021   NEUTROABS 2.1 12/14/2021   HGB 13.3 12/14/2021   HCT 38.4 12/14/2021   MCV 88.1 12/14/2021   PLT 190 12/14/2021    Imaging:    Assessment/Plan 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HCC)  Joanne Jackson is clinically stable today, now having cycle  #4 adjuvant Temodar.  MRI is not available for review yet, pending electronic share from [facility].  Team there recommended continuing Temodar.  We recommended continuing treatment with cycle #5 Temozolomide 150 mg/m2, on for five days and off for twenty three days in twenty eight day cycles. The patient Jackson have a complete blood count performed on days 21 and 28 of each cycle, and a comprehensive metabolic panel performed on day 28 of each cycle. Labs may need to be performed more often. Zofran Jackson prescribed for home use for nausea/vomiting.   Chemotherapy should be held for the following:  ANC less than 1,000  Platelets less than 100,000  LFT or creatinine greater than 2x ULN  If clinical concerns/contraindications develop  May con't trazodone to 172m HS, also on Lunesta 230m    Keppra Jackson con't [id number].  We appreciate the opportunity to participate in the care of Joanne Jackson.   We ask that GanMillissa Deeseturn to clinic in 1 months with labs for evaluation prior to cycle #6 with labs for evaluation, or sooner as needed.  All questions were answered. The patient knows to call the clinic with any problems, questions or concerns. No barriers to learning were detected.  The total time spent in the encounter was 30 minutes and more than 50% was on counseling and review of test results   ZacVentura SellersD Medical Director of Neuro-Oncology [facility] WesBertram/09/23 11:37 AM

## 2021-12-15 ENCOUNTER — Encounter: Payer: Self-pay | Admitting: Physical Therapy

## 2021-12-15 ENCOUNTER — Ambulatory Visit: Payer: 59 | Admitting: Physical Therapy

## 2021-12-15 DIAGNOSIS — R41841 Cognitive communication deficit: Secondary | ICD-10-CM | POA: Diagnosis not present

## 2021-12-15 DIAGNOSIS — M6281 Muscle weakness (generalized): Secondary | ICD-10-CM

## 2021-12-18 ENCOUNTER — Telehealth: Payer: Self-pay | Admitting: Internal Medicine

## 2021-12-18 ENCOUNTER — Ambulatory Visit: Payer: 59 | Admitting: Behavioral Health

## 2021-12-18 NOTE — Telephone Encounter (Signed)
Dr Annamaria Boots placed order on 10/2 for oral appliance to be sent to Dr Ron Parker.  Sleep study has not been received.  Holly I and Arby Barrette have been trying to contact provider of  sleep study.  I can't send order to Dr Ron Parker without a sleep study.  Will route message to [facility] for tracking.

## 2021-12-19 ENCOUNTER — Ambulatory Visit (INDEPENDENT_AMBULATORY_CARE_PROVIDER_SITE_OTHER): Payer: 59 | Admitting: Obstetrics & Gynecology

## 2021-12-19 ENCOUNTER — Other Ambulatory Visit (HOSPITAL_COMMUNITY)
Admission: RE | Admit: 2021-12-19 | Discharge: 2021-12-19 | Disposition: A | Discharge status: Home / Self Care | Payer: 59 | Source: Ambulatory Visit | Attending: Obstetrics & Gynecology | Admitting: Obstetrics & Gynecology

## 2021-12-19 ENCOUNTER — Encounter: Payer: Self-pay | Admitting: Obstetrics & Gynecology

## 2021-12-19 VITALS — BP 102/68 | HR 64 | Ht 60.25 in | Wt 149.0 lb

## 2021-12-19 DIAGNOSIS — C719 Malignant neoplasm of brain, unspecified: Secondary | ICD-10-CM | POA: Diagnosis not present

## 2021-12-19 DIAGNOSIS — Z01419 Encounter for gynecological examination (general) (routine) without abnormal findings: Secondary | ICD-10-CM | POA: Insufficient documentation

## 2021-12-19 DIAGNOSIS — N469 Male infertility, unspecified: Secondary | ICD-10-CM

## 2021-12-19 NOTE — Progress Notes (Signed)
Joanne Jackson Feb 20, 1975 [id number]   History:    46 y.o. G1P1L1 Married.  Daughter 58 yo (IVF).     RP:  Established patient presenting for annual gyn exam    HPI:  Had Brain surgery for cancer, followed by Chemotherapy in 05/2021.  No on oral chemotherapy 5 days per month until 02/2022. Menses every 18-24 days, lasting about 5 days with moderate flow.  Now having mild post menstrual spotting.  Pelvic US 10/19/21 with thin normal endometrial line at 1.95 mm.  No pelvic pain. No abnormal vaginal discharge.  Pap Neg 12/2019.  Pap reflex today.  Using condoms and h/o female infertility.  Urine/BMs normal.  Breasts normal. Mammo 01/2021 Neg.  BMI 28.86.  Exercising regularly.  Health labs with Fam MD.  Harriet Masson 09/2018.  Flu shot at [facility] MD office.     Aex//jj PAP: 01-26-21, MMG: 01-16-21, COLONOSCOPY: 09-23-18 Flu vaccine done at pcp   Past medical history,surgical history, family history and social history were all reviewed and documented in the EPIC chart.  Gynecologic History Patient's last menstrual period was 12/05/2021 (exact date).  Obstetric History OB History  Gravida Para Term Preterm AB Living  '1 1 1     1  '$ SAB IAB Ectopic Multiple Live Births               # Outcome Date GA Lbr Len/2nd Weight Sex Delivery Anes PTL Lv  1 Term              ROS: A ROS was performed and pertinent positives and negatives are included in the history. GENERAL: No fevers or chills. HEENT: No change in vision, no earache, sore throat or sinus congestion. NECK: No pain or stiffness. CARDIOVASCULAR: No chest pain or pressure. No palpitations. PULMONARY: No shortness of breath, cough or wheeze. GASTROINTESTINAL: No abdominal pain, nausea, vomiting or diarrhea, melena or bright red blood per rectum. GENITOURINARY: No urinary frequency, urgency, hesitancy or dysuria. MUSCULOSKELETAL: No joint or muscle pain, no back pain, no recent trauma. DERMATOLOGIC: No rash, no itching, no lesions. ENDOCRINE: No polyuria,  polydipsia, no heat or cold intolerance. No recent change in weight. HEMATOLOGICAL: No anemia or easy bruising or bleeding. NEUROLOGIC: No headache, seizures, numbness, tingling or weakness. PSYCHIATRIC: No depression, no loss of interest in normal activity or change in sleep pattern.     Exam:   BP 102/68   Pulse 64   Ht 5' 0.25" (1.53 m)   Wt 149 lb (67.6 kg)   LMP 12/05/2021 (Exact Date) Comment: husband is infertile & condom use  SpO2 99%   BMI 28.86 kg/m   Body mass index is 28.86 kg/m.  General appearance : Well developed well nourished female. No acute distress HEENT: Eyes: no retinal hemorrhage or exudates,  Neck supple, trachea midline, no carotid bruits, no thyroidmegaly Lungs: Clear to auscultation, no rhonchi or wheezes, or rib retractions  Heart: Regular rate and rhythm, no murmurs or gallops Breast:Examined in sitting and supine position were symmetrical in appearance, no palpable masses or tenderness,  no skin retraction, no nipple inversion, no nipple discharge, no skin discoloration, no axillary or supraclavicular lymphadenopathy Abdomen: no palpable masses or tenderness, no rebound or guarding Extremities: no edema or skin discoloration or tenderness  Pelvic: Vulva: Normal             Vagina: No gross lesions or discharge  Cervix: No gross lesions or discharge.  Pap reflex done.  Uterus  RV, normal size, shape and  consistency, non-tender and mobile  Adnexa  Without masses or tenderness  Anus: Normal  Pelvic US 10/19/21: T/V images.  Retroverted uterus normal in size and shape with inhomogeneous myometrium with streaky shadowing suggestive of adenomyosis.  2 small intramural fibroids measured at 0.8 cm and 0.92 cm.  The overall uterine size is measured at 8.07 x 5.62 x 4.65 cm.  Thin and symmetrical endometrial lining measured at 1.95 mm.  No mass or thickening or abnormal blood flow seen at the endometrium.  Both ovaries are small with sparse follicles.  No adnexal  mass.  No free fluid in the pelvis.    Assessment/Plan:  46 y.o. female for annual exam   1. Encounter for routine gynecological examination with Papanicolaou smear of cervix Had Brain surgery for cancer, followed by Chemotherapy in 05/2021.  No on oral chemotherapy 5 days per month until 02/2022. Menses every 18-24 days, lasting about 5 days with moderate flow.  Now having mild post menstrual spotting.  Pelvic US 10/19/21 with thin normal endometrial line at 1.95 mm.  No pelvic pain. No abnormal vaginal discharge.  Pap Neg 12/2019.  Pap reflex today.  Using condoms and h/o female infertility.  Urine/BMs normal.  Breasts normal. Mammo 01/2021 Neg.  BMI 28.86.  Exercising regularly.  Health labs with Fam MD.  Harriet Masson 09/2018.  Flu shot at [facility] MD office.  - Cytology - PAP( [place])  2. Female infertility Using condoms.  3. 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[place]) On chemotherapy.  Other orders - melatonin 3 MG TABS tablet; Take 3 mg by mouth at bedtime.   Princess Bruins MD, 3:32 PM 12/19/2021

## 2021-12-21 ENCOUNTER — Ambulatory Visit
Admission: RE | Admit: 2021-12-21 | Discharge: 2021-12-21 | Disposition: A | Discharge status: Home / Self Care | Payer: Self-pay | Source: Ambulatory Visit | Attending: Internal Medicine | Admitting: Internal Medicine

## 2021-12-21 ENCOUNTER — Other Ambulatory Visit: Payer: Self-pay | Admitting: *Deleted

## 2021-12-21 DIAGNOSIS — C719 Malignant neoplasm of brain, unspecified: Secondary | ICD-10-CM

## 2021-12-21 NOTE — Therapy (Signed)
OUTPATIENT PHYSICAL THERAPY NEURO NOTE    Patient Name: Joanne Jackson MRN: [id number] DOB:1975-09-18, 46 y.o., female Today's Date: 12/22/2021   PCP: Leamon Arnt, MD REFERRING PROVIDER: Ancil Boozer, FNP    PT End of Session - 12/22/21 1013     Visit Number 7    Number of Visits 10    Date for PT Re-Evaluation 01/12/22    Authorization Type [employer]    Authorization - Number of Visits 60   combined   PT Start Time 0930    PT Stop Time 1012    PT Time Calculation (min) 42 min    Equipment Utilized During Treatment Gait belt    Activity Tolerance Patient tolerated treatment well    Behavior During Therapy WFL for tasks assessed/performed                   Past Medical History:  Diagnosis Date   Brain cancer ([place])    Glioma ([place]) 05/18/2021   Hypertension    Pre-diabetes    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[place])    Past Surgical History:  Procedure Laterality Date   BRAIN SURGERY  05/2021   tumor removal   CESAREAN SECTION     DILATION AND CURETTAGE, DIAGNOSTIC / THERAPEUTIC     Patient Active Problem List   Diagnosis Date Noted   Moderate obstructive sleep apnea 10/31/2021   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   Dyslipidemia 04/30/2021   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[place]) 04/30/2021   Episode of transient neurologic symptoms 04/28/2021   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[place]) 10/25/2020   History of gestational diabetes 10/25/2020   Iron deficiency anemia 12/03/2019   Tubular adenoma of colon 10/24/2018   Essential hypertension 12/26/2017   Prediabetes 12/26/2017   Intramural leiomyoma of uterus 07/29/2015    ONSET DATE: 05/2021  REFERRING DIAG: C71.9 (ICD-10-CM) - Malignant neoplasm of brain, unspecified  THERAPY DIAG:  Muscle weakness (generalized)  Rationale for Evaluation and Treatment Rehabilitation  SUBJECTIVE:                                                                                                                                                                                               SUBJECTIVE STATEMENT: Nothing is new. Doing well.  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 No  PRECAUTIONS: None  PATIENT GOALS improve strength in L side   OBJECTIVE:      TODAY'S TREATMENT: 12/22/21 Activity Comments  Treadmill walk at 1.8 mph x  3 min, followed by jog/walk intervals lasting 1.5 min at 3.6 mph and 1 min at 1.8 mph, followed by 3 min of walking at 1.8 mph for a total of 10 min on treadmill  Continued cueing for increased L step length and height   Squat jumps in front of mirror 3x10 Cueing to shift weigt posteriorly during squat and coordinate arm swing  modified mountain climbers at counter top 2x10 Used 2 hurdles to avoid moving feet up with each rep  modified high plank shoulder taps at counter top 2x20 Cues to drop bottom down to maintain neutral spine   agility ladder: Fwd run 4x Sideways run 4x Double leg hop 4x  Double leg out and in 4x  In-in, out-out 4x  Bradykinesia and reliance on stronger R LE with wider hops; difficulty coordinating with more complex movements   L step downs + heel touch holding green medball at chest 10x On 4" step; required mod-max A for imbalance               HOME EXERCISE PROGRAM Last updated: 12/15/21 Access Code: [email or web address] URL: [email or web address] Date: 12/15/2021 Prepared by: [facility]  Exercises - Standing Hamstring Curl with Resistance  - 1 x daily - 5 x weekly - 2 sets - 10 reps - Forward Step Down  - 1 x daily - 5 x weekly - 2 sets - 10 reps - Squat with Chair Touch  - 1 x daily - 5 x weekly - 2 sets - 10 reps - Sidelying Hip Abduction  - 1 x daily - 5 x weekly - 2 sets - 10 reps - Sidelying Hip Adduction  - 1 x daily - 5 x  weekly - 2 sets - 10 reps - Prone Hip Extension  - 1 x daily - 5 x weekly - 2 sets - 10 reps - Bird Dog  - 1 x daily - 5 x weekly - 2 sets - 10 reps - Full Plank  - 1 x daily - 5 x weekly - 3-5 reps - 10 sec hold - Full Plank on Knees  - 1 x daily - 5 x weekly - 3-5 reps - 10 sec hold     Below measures were taken at time of initial evaluation unless otherwise specified:   DIAGNOSTIC FINDINGS: 04/29/21 brain MRI: No contrast enhancement at the site of signal abnormality in the right frontal lobe. This remains highly concerning for tumor, most likely a low-grade glioma  COGNITION: Overall cognitive status: Within functional limits for tasks assessed   SENSATION: WFL  COORDINATION: Alternating pronation/supination: WNL Alternating toe tap: WNL Finger to nose: WNL    POSTURE: No Significant postural limitations  LOWER EXTREMITY ROM:     Active  Right Eval Left Eval  Hip flexion    Hip extension    Hip abduction    Hip adduction    Hip internal rotation    Hip external rotation    Knee flexion    Knee extension    Ankle dorsiflexion 18 14  Ankle plantarflexion    Ankle inversion    Ankle eversion     (Blank rows = not tested)  LOWER EXTREMITY MMT:    MMT (in sitting) Right Eval Left Eval Right 12/15/21 Left 12/15/21  Hip flexion 4+ 4 4+ 4+  Hip extension   4+ 4  Hip abduction 4+ 3+ 4+ 4  Hip adduction 4- 4- 4- 4-  Hip internal rotation  Hip external rotation      Knee flexion 4+ 4- 4+ 4-  Knee extension 4+ 4- 4+ 4-  Ankle dorsiflexion 4+ 4+ 5 4+  Ankle plantarflexion 4; 20 reps with compensations 4; 20 reps with compensations 5 4+ (20 reps with minor compensations)  Ankle inversion      Ankle eversion      (Blank rows = not tested) *MMT on 12/15/21 performed in standard positioning, not sitting  GAIT: Gait pattern: [facility] Assistive device utilized: None Level of assistance: Complete Independence   FUNCTIONAL TESTs:  5 times sit to stand: 9.73  sec Functional gait assessment: 29      PATIENT EDUCATION: Education details: prognosis, POC, HEP Person educated: Patient Education method: Explanation, Demonstration, Tactile cues, Verbal cues, and Handouts Education comprehension: verbalized understanding and returned demonstration   HOME EXERCISE PROGRAM: Access Code: [email or web address] URL: [email or web address] Date: 11/07/2021 Prepared by: [facility]  Exercises - Marching with Resistance  - 1 x daily - 5 x weekly - 2 sets - 10 reps - Standing Hamstring Curl with Resistance  - 1 x daily - 5 x weekly - 2 sets - 10 reps - Mini Squat with Counter Support  - 1 x daily - 5 x weekly - 2 sets - 10 reps - Sidelying Hip Abduction  - 1 x daily - 5 x weekly - 2 sets - 10 reps - Supine Bridge with Mini Swiss Ball Between Knees  - 1 x daily - 5 x weekly - 2 sets - 10 reps    GOALS: Goals reviewed with patient? Yes  SHORT TERM GOALS: Target date: 11/28/2021  Patient to be independent with initial HEP. Baseline: HEP initiated Goal status: MET 11/14/21    LONG TERM GOALS: Target date: 01/12/2022  Patient to be independent with advanced HEP. Baseline: Not yet initiated; reported compliance and understanding 12/15/21 Goal status: IN PROGRESS 12/15/21  Patient to demonstrate B LE strength >/=4/5.  Baseline: See above; improving, see above 12/15/21 Goal status: IN PROGRESS 12/15/21  Patient to demonstrate good running form and stability on ground or treadmill.  Baseline: NT; reduced L step length on L foot 12/15/21 Goal status: IN PROGRESS 12/15/21    ASSESSMENT:  CLINICAL IMPRESSION: Patient arrived to session without complaints. Able to increased length of time with jogging intervals on treadmill today, however patient visibly fatigued with this challenge. Worked on power generation and coordination with squat jumps, using mirror for improved body awareness. Quick plyometric  activities to further challenge coordination revealed heavier R LE reliance. Modified core strengthening activities for improved success; patient with difficulty controlling foot placement with some of these activities. Patient tolerated session well and without complaints at end of session.     OBJECTIVE IMPAIRMENTS decreased strength.   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   REHAB POTENTIAL: Good  CLINICAL DECISION MAKING: Evolving/moderate complexity  EVALUATION COMPLEXITY: Moderate  PLAN: PT FREQUENCY: 1x/week  PT DURATION: 4 weeks  PLANNED INTERVENTIONS: Therapeutic exercises, Therapeutic activity, Neuromuscular re-education, Balance training, Gait training, Patient/Family education, Self Care, Joint mobilization, Joint manipulation, Stair training, Vestibular training, Canalith repositioning, Visual/preceptual remediation/compensation, Aquatic Therapy, Dry Needling, Electrical stimulation, Cryotherapy, Moist heat, Taping, Manual therapy, and Re-evaluation  PLAN FOR NEXT SESSION:  progress L LE strengthening, increasing step length on L; coordination, balance    Janene Harvey, PT, DPT 12/22/21 10:15 AM  [facility] at [facility] [employer], [place], [place] [address] Phone # [phone] Fax # [phone]

## 2021-12-22 ENCOUNTER — Ambulatory Visit: Payer: 59 | Admitting: Physical Therapy

## 2021-12-22 ENCOUNTER — Ambulatory Visit: Payer: 59 | Admitting: Occupational Therapy

## 2021-12-22 ENCOUNTER — Ambulatory Visit: Payer: 59

## 2021-12-22 ENCOUNTER — Encounter: Payer: Self-pay | Admitting: Physical Therapy

## 2021-12-22 DIAGNOSIS — R278 Other lack of coordination: Secondary | ICD-10-CM

## 2021-12-22 DIAGNOSIS — I69354 Hemiplegia and hemiparesis following cerebral infarction affecting left non-dominant side: Secondary | ICD-10-CM

## 2021-12-22 DIAGNOSIS — M6281 Muscle weakness (generalized): Secondary | ICD-10-CM

## 2021-12-22 DIAGNOSIS — R41841 Cognitive communication deficit: Secondary | ICD-10-CM | POA: Diagnosis not present

## 2021-12-22 LAB — CYTOLOGY - PAP: Diagnosis: NEGATIVE

## 2021-12-22 NOTE — Therapy (Signed)
OUTPATIENT OCCUPATIONAL THERAPY Treatment Note  Patient Name: Joanne Jackson MRN: [id number] DOB:[birth year], 46 y.o., female Today's Date: 12/22/2021  PCP: Leamon Arnt, MD REFERRING PROVIDER: Ancil Boozer, [place]    OT End of Session - 12/22/21 0904     Visit Number 3    Number of Visits 7    Date for OT Re-Evaluation 01/12/22    Authorization Type [employer]    OT Start Time [phone]    OT Stop Time 0930    OT Time Calculation (min) 43 min               Past Medical History:  Diagnosis Date   Brain cancer ([place])    Glioma ([place]) 05/18/2021   Hypertension    Pre-diabetes    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[place])    Past Surgical History:  Procedure Laterality Date   BRAIN SURGERY  05/2021   tumor removal   CESAREAN SECTION     DILATION AND CURETTAGE, DIAGNOSTIC / THERAPEUTIC     Patient Active Problem List   Diagnosis Date Noted   Moderate obstructive sleep apnea 10/31/2021   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   Dyslipidemia 04/30/2021   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[place]) 04/30/2021   Episode of transient neurologic symptoms 04/28/2021   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[place]) 10/25/2020   History of gestational diabetes 10/25/2020   Iron deficiency anemia 12/03/2019   Tubular adenoma of colon 10/24/2018   Essential hypertension 12/26/2017   Prediabetes 12/26/2017   Intramural leiomyoma of uterus 07/29/2015    ONSET DATE: referral date 11/08/21  REFERRING DIAG: C71.9 (ICD-10-CM) - Malignant neoplasm of brain, unspecified R53.1 (ICD-10-CM) - Weakness   THERAPY DIAG:  Muscle weakness (generalized)  Hemiplegia and hemiparesis following cerebral infarction affecting left non-dominant side (HCC)  Other lack of coordination  Rationale for Evaluation and Treatment Rehabilitation  SUBJECTIVE:   SUBJECTIVE STATEMENT: Pt reports starting her new chemo on Tues and has been feeling okay.    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 No  FALLS: Has patient fallen in last 6 months? 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  OBJECTIVE:   HAND DOMINANCE: Right   UPPER EXTREMITY MMT:     MMT Right eval Left eval  Shoulder flexion 4+/5 4/5  Shoulder abduction    Shoulder adduction    Shoulder extension    Shoulder internal rotation    Shoulder external rotation    Middle trapezius    Lower trapezius    Elbow flexion 4+ 4  Elbow extension 4+ 4  Wrist flexion    Wrist extension    Wrist ulnar deviation    Wrist radial deviation    Wrist pronation    Wrist supination    (Blank rows = not tested)  HAND FUNCTION: Grip strength: Right: 42 lbs; Left: 25 lbs, Lateral pinch: Right: 17 lbs, Left: 12 lbs, and 3 point pinch: Right: 13 lbs, Left: 8 lbs  COORDINATION: Finger Nose Finger test: Symmetrical bilaterally, completed 11 in 10 secs on R, 10 in 10 secs on  L 9 Hole Peg test: Right: 21.25 sec; Left: 32.57 sec Box and Blocks:  Right 56 blocks, Left 45 blocks    TODAY'S TREATMENT:  12/22/21 Coordination: engaged in small peg board pattern replication with LUE to focus on [facility], progressing to in-hand manipulation and translation.  Pt demonstrating good motor control when placing one at a time, however demonstrating min drops with increased challenge of translation from palm to finger tips.   9 hole peg test: 27.94 sec Box and Blocks: 46 Therapeutic exercises: Engaged in horizontal abduction, external rotation with scapular retraction, PNF  D2 flexion/extension,  shoulder flexion, shoulder abduction, and scapular retraction with red theraband.  Pt completed x10 with each exercise.  OT providing min cues for technique, providing tactile cues to decrease shoulder hike during shoulder flexion and abduction in standing. Utilized Geologist, engineering for visual feedback to facilitate increased symmetry due to LUE weakness.     12/12/21 Resistance Clothespins: 2,4,6# with LUE for mid functional reaching and sustained pinch. Pt req'd min  cues for movement pattern and maintaining good positioning of LUE with reach.  OT increased challenge with addition of 8# resistance clothespin with good success.  OT challenged pt in various pinch patterns during removal of clothespins.  Required sit > partial stand to place clothespins at higher range, no c/o pain in shoulder with increased height.   Wall slides: Shoulder flexion/extension, diagonals, and horizontal arc movements x10 each.  OT providing demonstration, min cues for technique, and cues to hold position at end range for 1-2 secs for sustained hold as pt initially completing tasks quickly. [place]: completing tangram puzzle with focus on picking up pieces, not sliding, and placing in matching spaces.  Pt demonstrating good motor control, requiring min cue to locate correct shape x1.  Lacing pattern and threading beads with focus on BUE use.  Pt holding lacing pattern in R hand while threading with L hand.  Pt then threading beads with LUE after picking up 5 pegs one at a time and placing in palm to then translate to finger tips to thread.  OT reiterated use of LUE during functional tasks and play activities with daughter.   11/29/21 Engaged in fine motor coordination education with focus on picking up variety of small items one at a time and placing in container, picking up coins and stacking, picking up coins and placing in coin slot, picking up coins and translating finger tips > palm > finger tips to place on table  or in container.  Educated on additional coordination tasks with stringing beads, threading nuts/bolts, and provided with handout.  PATIENT EDUCATION: Education details: Educated on role and purpose of OT as well as potential interventions and goals for therapy based on initial evaluation findings. Person educated: Patient Education method: Explanation, Demonstration, and Handouts Education comprehension: verbalized understanding and needs further education  HOME EXERCISE PROGRAM: Fine motor coordination handout provided  Access Code: [email or web address] URL: [email or web address] Date: 12/22/2021 Prepared by: [facility]  Exercises - Shoulder Flexion Wall Slide with Towel  - 3 x weekly - 2 sets - 10 reps - Shoulder Scaption Wall Slide with Towel  - 3 x weekly - 2 sets - 10 reps - Standing Shoulder Horizontal Abduction with Resistance  - 3 x weekly - 2 sets - 10 reps - 3-5 hold - Shoulder External Rotation and Scapular Retraction with Resistance  - 3 x weekly - 2 sets - 10 reps - 3-5 hold - Standing Shoulder Single  Arm PNF D2 Flexion with Resistance  - 3 x weekly - 2 sets - 10 reps - 3-5 hold - Standing Shoulder Flexion with Resistance  - 3 x weekly - 2 sets - 10 reps - 3-5 hold - Standing Single Arm Shoulder Abduction with Resistance  - 3-5 x daily - 3 x weekly - 2 sets - 10 reps - Scapular Retraction with Resistance  - 3 x weekly - 2 sets - 10 reps - 3-5 hold   GOALS: Goals reviewed with patient? Yes  SHORT TERM GOALS: Target date: 12/22/21    Status:  1 Pt will demonstrate improved fine motor coordination for ADLs as evidenced by decreasing 9 hole peg test score for LUE by 4 secs Baseline: Right: 21.25 sec; Left: 32.57 sec Met - 27.94 sec on L on 12/22/21  2 Pt will demonstrate improved UE functional use for ADLs as evidenced by increasing box/ blocks score by 3 blocks with LUE Baseline: Right 56 blocks, Left 45 blocks Not met - 46 blocks  on 12/22/21  3 Pt will be independent in strengthening and coordination HEP. Baseline:  Met - 12/22/21    LONG TERM GOALS: Target date: 01/12/22    Status:  1 Pt will demonstrate improved fine motor coordination for ADLs as evidenced by decreasing 9 hole peg test score for LUE by 7 secs Baseline: Right: 21.25 sec; Left: 32.57 sec Progressing  2 Pt will demonstrate improved UE functional use for ADLs as evidenced by increasing box/ blocks score by 6 blocks with LUE Baseline: Right 56 blocks, Left 45 blocks Progressing  3 Pt will demonstrate improved LUE strength as needed to engage in ADLs and IADLs Baseline:  Progressing  4 Pt will demonstrate improved functional reach in LUE to obtain moderate weight items at moderate range with LUE and/or BUE and transport to another surface as needed to engage in ADLs and IADLs Baseline:  Progressing  5 Pt will verbalize understanding of energy conservation strategies to increase independence and safety with ADLs and IADLs. Baseline:  Progressing    ASSESSMENT:  CLINICAL IMPRESSION: Pt demonstrating good tolerance to ROM, requiring increased cues to decrease compensatory strategies with addition of resistance (red theraband).  Pt educated on modifications of length of theraband to facilitate just right challenge of resistance.  Pt demonstrating improvements in [facility] on 9 hole peg test, however continues to demonstrate mild impairments in [facility] per Box and Blocks assessment.  Pt provided with HEP for strengthening with red theraband with good demonstration of exercises after repetition.  PERFORMANCE DEFICITS: in functional skills including ADLs, IADLs, coordination, ROM, strength, FMC, GMC, balance, endurance, decreased knowledge of precautions, decreased knowledge of use of DME, and UE functional use.   IMPAIRMENTS: are limiting patient from ADLs, IADLs, and work.   CO-MORBIDITIES; may have co-morbidities  that affects occupational performance. Patient  will benefit from skilled OT to address above impairments and improve overall function.  MODIFICATION OR ASSISTANCE TO COMPLETE EVALUATION: No modification of tasks or assist necessary to complete an evaluation.  OT OCCUPATIONAL PROFILE AND HISTORY: Detailed assessment: Review of records and additional review of physical, cognitive, psychosocial history related to current functional performance.  CLINICAL DECISION MAKING: LOW - limited treatment options, no task modification necessary  REHAB POTENTIAL: Good  EVALUATION COMPLEXITY: Low    PLAN:  OT FREQUENCY: 1x/week  OT DURATION: 6 weeks  PLANNED INTERVENTIONS: self care/ADL training, therapeutic exercise, therapeutic activity, neuromuscular re-education, balance training, functional mobility training, aquatic therapy, patient/family education, psychosocial skills  training, energy conservation, coping strategies training, and DME and/or AE instructions  RECOMMENDED OTHER SERVICES: N/A  CONSULTED AND AGREED WITH PLAN OF CARE: Patient  PLAN FOR NEXT SESSION: Engage in functional reaching task, Review strengthening HEP for UE and hand, review Coordination HEP   Aury Scollard, [place], OTR/L 12/22/2021, 9:04 AM

## 2021-12-22 NOTE — Therapy (Signed)
OUTPATIENT SPEECH LANGUAGE PATHOLOGY TREATMENT SESSION   Patient Name: Joanne Jackson MRN: [id number] DOB:12/20/1975, 46 y.o., female Today's Date: 10/13/2021  PCP: Leamon Arnt., MD REFERRING PROVIDER: Kathyrn Drown, MD    End of Session - 12/22/21 1954     Visit Number 12    Number of Visits 25    Date for SLP Re-Evaluation 01/05/22    Authorization - Number of Visits 70    SLP Start Time 0805    SLP Stop Time  0845    SLP Time Calculation (min) 40 min    Activity Tolerance Patient tolerated treatment well                     Past Medical History:  Diagnosis Date   Brain cancer (Home)    Glioma ([place]) 05/18/2021   Hypertension    Pre-diabetes    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[place])    Past Surgical History:  Procedure Laterality Date   BRAIN SURGERY  05/2021   tumor removal   CESAREAN SECTION     DILATION AND CURETTAGE, DIAGNOSTIC / THERAPEUTIC     Patient Active Problem List   Diagnosis Date Noted   Moderate obstructive sleep apnea 10/31/2021   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   Dyslipidemia 04/30/2021   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  Rationale for Evaluation and Treatment Rehabilitation  SUBJECTIVE:   SUBJECTIVE STATEMENT: "I gave a 40% tip. I did not want to do that." Pt accompanied by: self  PERTINENT HISTORY: Patient presented to medical attention in March 2023 with new onset  seizures, characterized by shaking of the left hand without impaired awareness. CNS imaging demonstrated non enhancing mass within right frontal lobe, c/w primary neoplasm. She underwent craniotomy, resection on 05/18/21 with Dr. Tommi Rumps at [facility]; pathology was High Grade Glioma IDHmt. Since surgery, husband has noticed more blunted (depressed?) mood, less responsive and engaging. Otherwise she is fully independent and functional. Referred to [facility] from [facility] for local implementation of radiation and chemotherapy. Last day of IMRT and Temodar yesterday (07-31-21). Pt had HHST upon d[facility].  PAIN:  Are you having pain? No  PATIENT GOALS  None stated today when SLP asked, due to decr'd intellectual awareness.   OBJECTIVE:  TODAY'S TREATMENT:  12/22/21: Pt did NOT double check her answers on her homework and got 70% correct. SLP STRONGLY encouraged pt to double check her answers. Pt's last day of Temodar is tomorrow. Pt acknowledges feeling very tired today.  12/11/21: Pt accuracy with homework was 26/27 (96%). She did NOT double check her answers, which SLP again reminded pt to do prior to turning in homework. She said she thought about it last night but hsould have set a reminder to do it today. Alternating attention during session with checking work and conversation WNL.  SLP inquired about pt's meal prep as she is primary cook in the household. Pt told SLP she does some of the prep at lunchtime and then the rest at dinner -  modification of the same meal with meat to accompany veggies and rice. Pt has not left stove on recently; When she did that the last time (>60 days ago), she noted bottom of pot was hot from the stovetop and immediately shut the stove off.  11/29/21: SLP corrected pt homework with her - 46/51 success. Pt did NOT double check her answers - SLP reminded (again) to ALWAYS double check her answers. SLP inquired about if pt's decr'd attetion/attention to detail was problematic in the  last two weeks and pt described a situation where she gave 40% tip due to distraction with how she put her card in card reader at hair salon. Pt was able to tell SLP how to change this situation next time without cues.   11/16/21: Pt returned today because sleep a little better regulated as 3-4 hours/night. Pt will cont to undergo her chemo cycle until middle of next year and then plan will change. She agrees with SLP that the further she progresses away from last day of chemo, then gets worse when chemo cycle begins again. Pt is compensating with appropriate compensations for memory, continues to make errors rarely - pt gave example of email details, but always cc:'s husband to have second set of eyes in order to correct her if necessary. She is paying some bills with double checking/supervision from E, she is using alarms (for meds) and timers, and is taking brain breaks when necessary for as long as she can up to 20 minutes. Pt agreed with SLP that frequency could be x1 every other week with d/c in 2-3 visits, depending on pt status. SLP provided pt with some homework for detailed work and error awareness.   10/13/21: Pt returned PROM today; results below in "PROM". SLP and pt discussed whether or not pt should cont with ST in the next 2-3 weeks given so little sleep the last 7-8 days. Pt reports being very tired today and SLP believes this will impede pt's performance today and her progress in the future until her sleep gets better regulated. Pt agreed to cx September appointments to allow her sleep to get regulated better - a sleep study is currently undergoing a long approval process from pt's insurance, pt reports.  Today, SLP assisted pt in detailed functional written language tasks in which pt req'd min-mod cues occasionally for error awareness and for alternating attention.   10/05/21: Pt brought in written homework with many erase marks. "These were hard," pt stated. SLP gave pt "code" homework task  which req'd her to alternate attention between response and stimuli. Pt stated she, eventually, used a straight edge as a guide and this was less difficult than no guide. Today she alternated attention WFL/WNL between detailed written tasks with cues for attention to detail. When she double-checked her answers she found all but one of her mistakes. SLP had door open for mod noise and pt req'd cue for "brain break" but she did not take one until SLP cued a second time; "I was still working," pt stated. She took 3 minute break and went back to tasks. SLP provided pt and husband PROM for cognition and asked pt return these next session. [name], at end of session today indicated she needs to use compensations for deficits in memory, for attention, and for mental fatigue.  09/26/21: Pt reports she will begin Temodar again tonight. Has possible sleep study tomorrow night. Homework completed in full, 100% success. Pt reported using sticky notes successfully for recall of  turning lights off and bringing her glasses downstairs. Today SLP used practical written math problems to target attending to details. Pt req'd consistent min A to note errors; Did not check work immediately (problem after problem), and pt had 3/6 incorrect after her double checking. Pt noted to move very quickly with math and make errors due to decr'd alternating attention writing down all figures needed for accuracy. SLP encouraged pt to follow with her finger to incr accuracy, and to cross off figures when she has written them down to aid in alternating attention.  09/19/21: Pt entered with homework completed. [name] told SLP she was able to make brain breaks work with her daughter accompanying her with a timer and muted iPad. In detailed tasks pt today did not incorporate a strategy for written language comprehension so she made errors (three). She fixed two, but one took SLP verbal cue for written organization. Homework for written detailed  task.  09/12/21: E expressed pt having difficulty with processing spoken information. SLP believes something to do with pt's attention level as well as with possible decr in auditory/overall processing. Pt brought her homework - evidence pt completed without double-checking her answers, and did not slow down and look at the task prior to completing; Errors were due to incomplete procedures (pt did not look fully at the information provided and wrote down the first item she saw). Pt has not been using "brain breaks" when her daughter is present because her daughter follows her wherever she goes. SLP provided some suggestions in "pt instructions" for pt and husband to try at home. SLP provided homework for auditory attention/processing for husband to complete with pt.  09/06/21: Pt showed SLP her written homework. On her corrected copy she still had errors. SLP assisted pt in organizing her responses and saw approx 9 minutes into task that pt stopped completely. Pt endorsed mental fatigue so SLP asked pt to take a brain break for 3 minutes. Pt returned to task with better sustained/selective attention for remaining 6 minutes of task. SLP then educated and instructed pt on the importance of and examples of brain breaks when completing focused tasks. SLP asked pt what situations she has found herself that her brain needs rest from focusing and she said TV shows. SLP suggested stopping/pausing TV and doing mini-meditation session for 3-5 minutes and returning to task after brain break. SLP shared details of this session with pt's husband (with pt's permission).  08/29/21: CLQTwas completed today The Cognitive Linguistic Quick Test (CLQT) was administered to assess the relative status of five cognitive domains: attention, memory, language, executive functioning, and visuospatial skills. Scores from 10 tasks were used to estimate severity ratings (standardized for age groups 18-69 years and 70-89 years) for each domain,  a clock drawing task, as well as an overall composite severity rating of cognition.      Task Score Criterion Cut Scores  Personal Facts 8/8 8  Symbol Cancellation 12/12 11  Confrontation Naming 10/10 10  Clock Drawing  10/13 12  Story Retelling 8/10 6  Symbol Trails 7/10 9  Generative Naming 4/10 5  Design Memory 6/6 5  Mazes  4/8 7  Design Generation 8/13 6    Cognitive Domain Composite Score Severity Rating  Attention 181/215 WNL (low)  Memory 168/185 WNL  Executive Function 23/40 Mild  Language 30/37 WNL (low)  Visuospatial Skills 82/105 WNL (low)  Clock Drawing  10/13 Mild  Composite Severity Rating 3.8 WNL   Overall composite rating is "WNL"  however on CLQT pt demonstrated decr'd skills in awareness, attention, executive function, and a lack of impulse control. Impulsivity decr'd pt's score specifically with clock drawing, mazes, and symbol trails.   (Date of eval): SLP discussed evaluation results thus far, and Constant Therapy was introduced and explained.     PATIENT REPORTED OUTCOME MEASURES (PROM): Cognitive Function: E scored [name] 31/40, and [name] scored herself 32/40 (higher numbers indicate better QOL)    PATIENT EDUCATION: Education details: See "today's treatment" Person educated: Patient Education method: Explanation Education comprehension: verbalized understanding and needs further education   GOALS: Goals reviewed with patient? Yes  SHORT TERM GOALS:  Target date: 11/10/2021 (extended due to pt resuming ST in October)  Pt will demo [facility] selective attention with functional tasks in mod noisy environment for 20 minutes in 2 sessions  Baseline: 09/26/21 Goal status: met  2.  Pt will demo WFL alternating attention between billpay task and additional random tasks in 2 sessions  Baseline: 10/05/21 Goal status: not met  3.  Pt will acknowledge at least two areas of deficit during three therapy sessions  Baseline: 09/19/21, 09-26-21 Goal status: Met  4.   [name] will note 80% of errors in therapy tasks when given chance to self correct in 3 sessions Baseline: 09/19/21, 10-05-21 Goal status: not met  5.  [name] will reportedly demonstrate less frequent "absent-mindedness" in the home (doors open, lights on, etc) than when compared to prior to OP ST, as reported by pt or E  Baseline:  Goal status: Met  LONG TERM GOALS: Target date: 01/05/2022  [name] will score better on E's and her own Cognitive PROM than during her first therapy session Baseline:  Goal status: Ongoing  2.  Pt will demo functional alternating attention with mod complex tasks in therapy session x3 and will reportedly demo functional alternating attention at home as reported by pt or by E, x3  Baseline: 11-16-21, 11-29-21, 12-11-21 Goal status: Met  3.  [name] will indicate anticipatory awareness during the session or at home as reported by pt or E, x5 Baseline: 11/16/21, 11-29-21 Goal status: Ongoing  4.  Pt will exhibit WFL organization and planning by paying bills independently Baseline: 11/16/21 Goal status: Ongoing   ASSESSMENT:  CLINICAL IMPRESSION: Patient is a 46 y.o. female who was seen today for cognitive communication tx. SEE THERAPY NOTE. [name] requires improved cognition for responsibilities at home, and is using compensatory strategies appropriately. Pt cont to refrain from double checking her work prior to turning in to SLP.  OBJECTIVE IMPAIRMENTS include attention, memory, awareness, and executive functioning. These impairments are limiting patient from return to work, managing medications, managing appointments, managing finances, household responsibilities, and effectively communicating at home and in community. Factors affecting potential to achieve goals and functional outcome are ability to learn/carryover information and co-morbidities.. Patient will benefit from skilled SLP services to address above impairments and improve overall function.  REHAB POTENTIAL:  Good  PLAN: SLP FREQUENCY: 2x/week  SLP DURATION: 12 weeks  PLANNED INTERVENTIONS: Language facilitation, Environmental controls, Cueing hierachy, Cognitive reorganization, Internal/external aids, Functional tasks, SLP instruction and feedback, and Compensatory strategies    Torri Langston, CCC-SLP 12/22/2021, 7:55 PM

## 2021-12-26 ENCOUNTER — Other Ambulatory Visit: Payer: Self-pay | Admitting: Internal Medicine

## 2021-12-26 DIAGNOSIS — C719 Malignant neoplasm of brain, unspecified: Secondary | ICD-10-CM

## 2021-12-26 NOTE — Therapy (Signed)
OUTPATIENT PHYSICAL THERAPY NEURO NOTE    Patient Name: Joanne Jackson MRN: [id number] DOB:1975-09-22, 46 y.o., female Today's Date: 12/27/2021   PCP: Willow Ora, MD REFERRING PROVIDER: Colonel Bald, FNP    PT End of Session - 12/27/21 1531     Visit Number 8    Number of Visits 10    Date for PT Re-Evaluation 01/12/22    Authorization Type [employer]    Authorization - Number of Visits 60   combined   PT Start Time 1448    PT Stop Time 1526    PT Time Calculation (min) 38 min    Equipment Utilized During Treatment Gait belt    Activity Tolerance Patient tolerated treatment well    Behavior During Therapy WFL for tasks assessed/performed                    Past Medical History:  Diagnosis Date   Brain cancer ([facility])    Glioma ([facility]) 05/18/2021   Hypertension    Pre-diabetes    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[facility])    Past Surgical History:  Procedure Laterality Date   BRAIN SURGERY  05/2021   tumor removal   CESAREAN SECTION     DILATION AND CURETTAGE, DIAGNOSTIC / THERAPEUTIC     Patient Active Problem List   Diagnosis Date Noted   Moderate obstructive sleep apnea 10/31/2021   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   Dyslipidemia 04/30/2021   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[facility]) 04/30/2021   Episode of transient neurologic symptoms 04/28/2021   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[facility]) 10/25/2020   History of gestational diabetes 10/25/2020   Iron deficiency anemia 12/03/2019   Tubular adenoma of colon 10/24/2018   Essential hypertension 12/26/2017   Prediabetes 12/26/2017   Intramural leiomyoma of uterus 07/29/2015    ONSET DATE: 05/2021  REFERRING DIAG: C71.9 (ICD-10-CM) - Malignant neoplasm of brain, unspecified  THERAPY DIAG:  Muscle weakness (generalized)  Rationale for Evaluation and Treatment Rehabilitation  SUBJECTIVE:                                                                                                                                                                                               SUBJECTIVE STATEMENT: Everything "as usual." HEP is "just okay." Pt accompanied by: self  PERTINENT HISTORY: R frontal glioma s/p resection 05/2021 and chemo/radiation, HTN, sjogren's  PAIN:  Are you having pain? No  PRECAUTIONS: None  PATIENT GOALS improve strength in L side   OBJECTIVE:     TODAY'S TREATMENT: 12/27/21 Activity Comments  single leg squats to mat  on airex 2x10 Chair for balance and manual cueing to shift bottom back   split squats 2x10 each LE 1 UE support on chair; cues to keep back heel up   squat + step out and in 2x10 In mirror; consistent cueing to maintain squat throughout; required demo throughout to maintain proper sequencing  single leg RDL 10x each  Demo throughout to maintain proper   Toe tap to 3 cones standing on foam 10x each side Cueing to slow down and shift onto standing LE  Romberg, 1/2 tandem, then full tandem on foam + 5# dumbbell pass behind back in CW/CCW directions Most instability in tandem, requiring guarding and occasional min A          PATIENT EDUCATION: Education details: discussed possibility of transitioning to gym or personal training once ready for DC Person educated: Patient Education method: Explanation Education comprehension: verbalized understanding    HOME EXERCISE PROGRAM Last updated: 12/15/21 Access Code: [email or web address] URL: [email or web address] Date: 12/15/2021 Prepared by: [facility] - Outpatient  Rehab - [facility]  Exercises - Standing Hamstring Curl with Resistance  - 1 x daily - 5 x weekly - 2 sets - 10 reps - Forward Step Down  - 1 x daily - 5 x weekly - 2 sets - 10 reps - Squat with Chair Touch  - 1 x daily - 5 x weekly - 2 sets - 10 reps - Sidelying Hip Abduction  - 1 x daily - 5 x weekly - 2 sets - 10 reps - Sidelying Hip Adduction  - 1 x daily - 5 x weekly - 2  sets - 10 reps - Prone Hip Extension  - 1 x daily - 5 x weekly - 2 sets - 10 reps - Bird Dog  - 1 x daily - 5 x weekly - 2 sets - 10 reps - Full Plank  - 1 x daily - 5 x weekly - 3-5 reps - 10 sec hold - Full Plank on Knees  - 1 x daily - 5 x weekly - 3-5 reps - 10 sec hold     Below measures were taken at time of initial evaluation unless otherwise specified:   DIAGNOSTIC FINDINGS: 04/29/21 brain MRI: No contrast enhancement at the site of signal abnormality in the right frontal lobe. This remains highly concerning for tumor, most likely a low-grade glioma  COGNITION: Overall cognitive status: Within functional limits for tasks assessed   SENSATION: WFL  COORDINATION: Alternating pronation/supination: WNL Alternating toe tap: WNL Finger to nose: WNL    POSTURE: No Significant postural limitations  LOWER EXTREMITY ROM:     Active  Right Eval Left Eval  Hip flexion    Hip extension    Hip abduction    Hip adduction    Hip internal rotation    Hip external rotation    Knee flexion    Knee extension    Ankle dorsiflexion 18 14  Ankle plantarflexion    Ankle inversion    Ankle eversion     (Blank rows = not tested)  LOWER EXTREMITY MMT:    MMT (in sitting) Right Eval Left Eval Right 12/15/21 Left 12/15/21  Hip flexion 4+ 4 4+ 4+  Hip extension   4+ 4  Hip abduction 4+ 3+ 4+ 4  Hip adduction 4- 4- 4- 4-  Hip internal rotation      Hip external rotation      Knee flexion 4+ 4- 4+ 4-  Knee extension 4+ 4- 4+  4-  Ankle dorsiflexion 4+ 4+ 5 4+  Ankle plantarflexion 4; 20 reps with compensations 4; 20 reps with compensations 5 4+ (20 reps with minor compensations)  Ankle inversion      Ankle eversion      (Blank rows = not tested) *MMT on 12/15/21 performed in standard positioning, not sitting  GAIT: Gait pattern: WFL Assistive device utilized: None Level of assistance: Complete Independence   FUNCTIONAL TESTs:  5 times sit to stand: 9.73  sec Functional gait assessment: 29      PATIENT EDUCATION: Education details: prognosis, POC, HEP Person educated: Patient Education method: Explanation, Demonstration, Tactile cues, Verbal cues, and Handouts Education comprehension: verbalized understanding and returned demonstration   HOME EXERCISE PROGRAM: Access Code: [email or web address] URL: [email or web address] Date: 11/07/2021 Prepared by: [facility] - Outpatient  Rehab - [facility]  Exercises - Marching with Resistance  - 1 x daily - 5 x weekly - 2 sets - 10 reps - Standing Hamstring Curl with Resistance  - 1 x daily - 5 x weekly - 2 sets - 10 reps - Mini Squat with Counter Support  - 1 x daily - 5 x weekly - 2 sets - 10 reps - Sidelying Hip Abduction  - 1 x daily - 5 x weekly - 2 sets - 10 reps - Supine Bridge with Mini Swiss Ball Between Knees  - 1 x daily - 5 x weekly - 2 sets - 10 reps    GOALS: Goals reviewed with patient? Yes  SHORT TERM GOALS: Target date: 11/28/2021  Patient to be independent with initial HEP. Baseline: HEP initiated Goal status: MET 11/14/21    LONG TERM GOALS: Target date: 01/12/2022  Patient to be independent with advanced HEP. Baseline: Not yet initiated; reported compliance and understanding 12/15/21 Goal status: IN PROGRESS 12/15/21  Patient to demonstrate B LE strength >/=4/5.  Baseline: See above; improving, see above 12/15/21 Goal status: IN PROGRESS 12/15/21  Patient to demonstrate good running form and stability on ground or treadmill.  Baseline: NT; reduced L step length on L foot 12/15/21 Goal status: IN PROGRESS 12/15/21    ASSESSMENT:  CLINICAL IMPRESSION: Patient arrived to session without complaints. Worked on progressive LE strengthening activities using body weight support today. Patient was able to perform more challenging activities today with fairly good weight shift/distribution without heavy reliance on weaker L LE. Patient does still  demonstrate difficulty following/coordinating multistep commands during activities requiring multiple movements to sequence. Patient tolerated session well and without complaints upon leaving.     OBJECTIVE IMPAIRMENTS decreased strength.   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   REHAB POTENTIAL: Good  CLINICAL DECISION MAKING: Evolving/moderate complexity  EVALUATION COMPLEXITY: Moderate  PLAN: PT FREQUENCY: 1x/week  PT DURATION: 4 weeks  PLANNED INTERVENTIONS: Therapeutic exercises, Therapeutic activity, Neuromuscular re-education, Balance training, Gait training, Patient/Family education, Self Care, Joint mobilization, Joint manipulation, Stair training, Vestibular training, Canalith repositioning, Visual/preceptual remediation/compensation, Aquatic Therapy, Dry Needling, Electrical stimulation, Cryotherapy, Moist heat, Taping, Manual therapy, and Re-evaluation  PLAN FOR NEXT SESSION:  progress L LE strengthening, increasing step length on L; coordination, balance    Anette Guarneri, PT, DPT 12/27/21 3:33 PM  [facility] [address] [place], [place] [address] Phone # [phone] Fax # [phone]

## 2021-12-27 ENCOUNTER — Ambulatory Visit: Payer: 59 | Admitting: Occupational Therapy

## 2021-12-27 ENCOUNTER — Ambulatory Visit: Payer: 59 | Admitting: Physical Therapy

## 2021-12-27 ENCOUNTER — Encounter: Payer: Self-pay | Admitting: Physical Therapy

## 2021-12-27 DIAGNOSIS — R41841 Cognitive communication deficit: Secondary | ICD-10-CM | POA: Diagnosis not present

## 2021-12-27 DIAGNOSIS — M6281 Muscle weakness (generalized): Secondary | ICD-10-CM

## 2021-12-27 DIAGNOSIS — R278 Other lack of coordination: Secondary | ICD-10-CM

## 2021-12-27 NOTE — Therapy (Signed)
OUTPATIENT OCCUPATIONAL THERAPY Treatment Note  Patient Name: Joanne Jackson MRN: [id number] DOB:30-Mar-1975, 46 y.o., female Today's Date: 12/27/2021  PCP: Leamon Arnt, MD REFERRING PROVIDER: Ancil Boozer, [place]    OT End of Session - 12/27/21 1534     Visit Number 4    Number of Visits 7    Date for OT Re-Evaluation 01/12/22    Authorization Type [employer]    OT Start Time 6122    OT Stop Time 4497    OT Time Calculation (min) 40 min                Past Medical History:  Diagnosis Date   Brain cancer ([place])    Glioma ([place]) 05/18/2021   Hypertension    Pre-diabetes    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[place])    Past Surgical History:  Procedure Laterality Date   BRAIN SURGERY  05/2021   tumor removal   CESAREAN SECTION     DILATION AND CURETTAGE, DIAGNOSTIC / THERAPEUTIC     Patient Active Problem List   Diagnosis Date Noted   Moderate obstructive sleep apnea 10/31/2021   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   Dyslipidemia 04/30/2021   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[place]) 04/30/2021   Episode of transient neurologic symptoms 04/28/2021   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[place]) 10/25/2020   History of gestational diabetes 10/25/2020   Iron deficiency anemia 12/03/2019   Tubular adenoma of colon 10/24/2018   Essential hypertension 12/26/2017   Prediabetes 12/26/2017   Intramural leiomyoma of uterus 07/29/2015    ONSET DATE: referral date 11/08/21  REFERRING DIAG: C71.9 (ICD-10-CM) - Malignant neoplasm of brain, unspecified R53.1 (ICD-10-CM) - Weakness   THERAPY DIAG:  Muscle weakness (generalized)  Other lack of coordination  Rationale for Evaluation and Treatment Rehabilitation  SUBJECTIVE:   SUBJECTIVE STATEMENT: Pt reports starting her new chemo on Tues and has been feeling okay.   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 No  FALLS: Has patient fallen in last 6 months? 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  OBJECTIVE:   HAND DOMINANCE: Right   UPPER EXTREMITY MMT:     MMT Right eval Left eval  Shoulder flexion 4+/5 4/5  Shoulder abduction    Shoulder adduction    Shoulder extension    Shoulder internal rotation    Shoulder external rotation    Middle trapezius    Lower trapezius    Elbow flexion 4+ 4  Elbow extension 4+ 4  Wrist flexion    Wrist extension    Wrist ulnar deviation    Wrist radial deviation    Wrist pronation    Wrist supination    (Blank rows = not tested)  HAND FUNCTION: Grip strength: Right: 42 lbs; Left: 25 lbs, Lateral pinch: Right: 17 lbs, Left: 12 lbs, and 3 point pinch: Right: 13 lbs, Left: 8 lbs  COORDINATION: Finger Nose Finger test: Symmetrical bilaterally, completed 11 in 10 secs on R, 10 in 10 secs on L 9 Hole Peg test: Right: 21.25 sec; Left: 32.57 sec  Box and Blocks:  Right 56 blocks, Left 45 blocks    TODAY'S TREATMENT:  12/27/21 Shoulder ROM: crossing midline and reaching outside BOS, incorporating forward reaching and diagonal reaching to pick up and place rings on vertical dowel.  Pt demonstrating good motor control, weight shifting, and coordination. Increased challenge to completing in standing to further challenge balance, endurance, and reaching with LUE. Putty: OT provided pt with yellow theraputty and instructed pt with verbal cues and demonstration on grasp, pinch, and coordination activities.  Pt demonstrating good understanding with intermittent cues,  especially with key pinch and pinching off small pieces of putty.    12/22/21 Coordination: engaged in small peg board pattern replication with LUE to focus on [facility], progressing to in-hand manipulation and translation.  Pt demonstrating good motor control when placing one at a time, however demonstrating min drops with increased challenge of translation from palm to finger tips.   9 hole peg test: 27.94 sec Box and Blocks: 46 Therapeutic exercises: Engaged in horizontal abduction, external rotation with scapular retraction, PNF D2 flexion/extension,  shoulder flexion, shoulder abduction, and scapular retraction with red theraband.  Pt completed x10 with each exercise.  OT providing min cues for technique, providing tactile cues to decrease shoulder hike during shoulder flexion and abduction in standing. Utilized Geologist, engineering for visual feedback to facilitate increased symmetry due to LUE weakness.     12/12/21 Resistance Clothespins: 2,4,6# with LUE for mid functional reaching and sustained pinch. Pt req'd min  cues for movement pattern and maintaining good positioning of LUE with reach.  OT increased challenge with addition of 8# resistance clothespin with good success.  OT challenged pt in various pinch patterns during removal of clothespins.  Required sit > partial stand to place clothespins at higher range, no c/o pain in shoulder with increased height.   Wall slides: Shoulder flexion/extension, diagonals, and horizontal arc movements x10 each.  OT providing demonstration, min cues for technique, and cues to hold position at end range for 1-2 secs for sustained hold as pt initially completing tasks quickly. [place]: completing tangram puzzle with focus on picking up pieces, not sliding, and placing in matching spaces.  Pt demonstrating good motor control, requiring min cue to locate correct shape x1.  Lacing pattern and threading beads with focus on BUE use.  Pt holding lacing pattern in R hand while threading  with L hand.  Pt then threading beads with LUE after picking up 5 pegs one at a time and placing in palm to then translate to finger tips to thread.  OT reiterated use of LUE during functional tasks and play activities with daughter.   PATIENT EDUCATION: Education details: ongoing condition specific education Person educated: Patient Education method: Explanation, Demonstration, and Handouts Education comprehension: verbalized understanding and needs further education  HOME EXERCISE PROGRAM: Fine motor coordination handout provided  Access Code: [email or web address] URL: [email or web address] Date: 12/22/2021 Prepared by: [facility]  Exercises - Shoulder Flexion Wall Slide with Towel  - 3 x weekly - 2 sets - 10 reps - Shoulder [place] Wall Slide with Towel  - 3 x weekly - 2 sets - 10 reps - Standing Shoulder Horizontal Abduction with Resistance  - 3 x weekly - 2 sets - 10 reps - 3-5 hold - Shoulder External Rotation and Scapular Retraction with Resistance  - 3 x weekly - 2 sets - 10 reps - 3-5 hold - Standing Shoulder Single Arm PNF D2 Flexion with Resistance  -  3 x weekly - 2 sets - 10 reps - 3-5 hold - Standing Shoulder Flexion with Resistance  - 3 x weekly - 2 sets - 10 reps - 3-5 hold - Standing Single Arm Shoulder Abduction with Resistance  - 3-5 x daily - 3 x weekly - 2 sets - 10 reps - Scapular Retraction with Resistance  - 3 x weekly - 2 sets - 10 reps - 3-5 hold  Access Code: [email or web address] URL: [email or web address] Date: 12/27/2021 Prepared by: [facility]  Exercises - Putty Squeezes  - 1 x daily - 1 sets - 10 reps - Thumb Opposition with Putty  - 1 x daily - 1 sets - 5 reps - 3-Point Pinch with Putty  - 1 x daily - 1 sets - 10 reps - Key Pinch with Putty  - 1 x daily - 1 sets - 10 reps - Finger Pinch and Pull with Putty  - 1 x daily - 1 sets - 10 reps - Tip Pinch with Putty  - 1 x  daily - 1 sets - 10 reps - Removing Marbles from Putty  - 1-2 reps   GOALS: Goals reviewed with patient? Yes  SHORT TERM GOALS: Target date: 12/22/21    Status:  1 Pt will demonstrate improved fine motor coordination for ADLs as evidenced by decreasing 9 hole peg test score for LUE by 4 secs Baseline: Right: 21.25 sec; Left: 32.57 sec Met - 27.94 sec on L on 12/22/21  2 Pt will demonstrate improved UE functional use for ADLs as evidenced by increasing box/ blocks score by 3 blocks with LUE Baseline: Right 56 blocks, Left 45 blocks Not met - 46 blocks on 12/22/21  3 Pt will be independent in strengthening and coordination HEP. Baseline:  Met - 12/22/21    LONG TERM GOALS: Target date: 01/12/22    Status:  1 Pt will demonstrate improved fine motor coordination for ADLs as evidenced by decreasing 9 hole peg test score for LUE by 7 secs Baseline: Right: 21.25 sec; Left: 32.57 sec Progressing  2 Pt will demonstrate improved UE functional use for ADLs as evidenced by increasing box/ blocks score by 6 blocks with LUE Baseline: Right 56 blocks, Left 45 blocks Progressing  3 Pt will demonstrate improved LUE strength as needed to engage in ADLs and IADLs Baseline:  Progressing  4 Pt will demonstrate improved functional reach in LUE to obtain moderate weight items at moderate range with LUE and/or BUE and transport to another surface as needed to engage in ADLs and IADLs Baseline:  Progressing  5 Pt will verbalize understanding of energy conservation strategies to increase independence and safety with ADLs and IADLs. Baseline:  Progressing    ASSESSMENT:  CLINICAL IMPRESSION: Pt demonstrating good tolerance to ROM incorporating functional reach to high and low surfaces.  Pt receptive to education on theraputty and functional carryover of reaching task into ADLs/IADLs.  PERFORMANCE DEFICITS: in functional skills including ADLs, IADLs, coordination, ROM, strength, FMC, GMC, balance,  endurance, decreased knowledge of precautions, decreased knowledge of use of DME, and UE functional use.   IMPAIRMENTS: are limiting patient from ADLs, IADLs, and work.   CO-MORBIDITIES; may have co-morbidities  that affects occupational performance. Patient will benefit from skilled OT to address above impairments and improve overall function.  MODIFICATION OR ASSISTANCE TO COMPLETE EVALUATION: No modification of tasks or assist necessary to complete an evaluation.  OT OCCUPATIONAL PROFILE AND HISTORY: Detailed assessment: Review of  records and additional review of physical, cognitive, psychosocial history related to current functional performance.  CLINICAL DECISION MAKING: LOW - limited treatment options, no task modification necessary  REHAB POTENTIAL: Good  EVALUATION COMPLEXITY: Low    PLAN:  OT FREQUENCY: 1x/week  OT DURATION: 6 weeks  PLANNED INTERVENTIONS: self care/ADL training, therapeutic exercise, therapeutic activity, neuromuscular re-education, balance training, functional mobility training, aquatic therapy, patient/family education, psychosocial skills training, energy conservation, coping strategies training, and DME and/or AE instructions  RECOMMENDED OTHER SERVICES: N/A  CONSULTED AND AGREED WITH PLAN OF CARE: Patient  PLAN FOR NEXT SESSION: Engage in functional reaching task, Review strengthening HEP for UE and hand, review Coordination HEP, stacking and [place] for Box and Blocks   Clanton Emanuelson, [place], OTR/L 12/27/2021, 4:27 PM

## 2022-01-01 NOTE — Therapy (Signed)
OUTPATIENT PHYSICAL THERAPY NEURO NOTE    Patient Name: Joanne Jackson MRN: [id number] DOB:Jun 11, 1975, 46 y.o., female Today's Date: 01/02/2022   PCP: Leamon Arnt, MD REFERRING PROVIDER: Ancil Boozer, FNP    PT End of Session - 01/02/22 1444     Visit Number 9    Number of Visits 10    Date for PT Re-Evaluation 01/12/22    Authorization Type [employer]    Authorization - Number of Visits 60   combined   PT Start Time 1402    PT Stop Time 1443    PT Time Calculation (min) 41 min    Equipment Utilized During Treatment Gait belt    Activity Tolerance Patient tolerated treatment well    Behavior During Therapy WFL for tasks assessed/performed                     Past Medical History:  Diagnosis Date   Brain cancer ([place])    Glioma ([place]) 05/18/2021   Hypertension    Pre-diabetes    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[place])    Past Surgical History:  Procedure Laterality Date   BRAIN SURGERY  05/2021   tumor removal   CESAREAN SECTION     DILATION AND CURETTAGE, DIAGNOSTIC / THERAPEUTIC     Patient Active Problem List   Diagnosis Date Noted   Moderate obstructive sleep apnea 10/31/2021   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   Dyslipidemia 04/30/2021   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[place]) 04/30/2021   Episode of transient neurologic symptoms 04/28/2021   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[place]) 10/25/2020   History of gestational diabetes 10/25/2020   Iron deficiency anemia 12/03/2019   Tubular adenoma of colon 10/24/2018   Essential hypertension 12/26/2017   Prediabetes 12/26/2017   Intramural leiomyoma of uterus 07/29/2015    ONSET DATE: 05/2021  REFERRING DIAG: C71.9 (ICD-10-CM) - Malignant neoplasm of brain, unspecified  THERAPY DIAG:  Muscle weakness (generalized)  Rationale for Evaluation and Treatment Rehabilitation  SUBJECTIVE:                                                                                                                                                                                               SUBJECTIVE STATEMENT: Had a good holiday. Had a friend visit from [place].  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 No  PRECAUTIONS: None  PATIENT GOALS improve strength in L side   OBJECTIVE:      TODAY'S TREATMENT: 01/02/22 Activity Comments  treadmill walking on grade 0 2 min 3.0 mph, grade 2.0 2 min 2.5 mph, grade 0 2 min 3.0 mph Holding on with B Ues; improved L foot clearance   slow retro walking on TM Trialed with very slow speed but pt hesitant and with trouble coordinating; discontinued   Backwards walking, then with additional ball toss Slight imbalance    Walking forward + hand to hand ball toss, then just L hand  Difficulty tossing with the L hand  high plank to downward dog 2 sets 3 reps  Good form  mountain climbers 2x10 Tendency to raise hips; slow speed  Jump rope 5x20-30" B foot hops with cueing to stay light on feet; frequently having to stop  walking lunges 4x 29f  Cueing to avoid anterior trunk lean and to look straight ahead              PATIENT EDUCATION: Education details: discussion on review of goals next session and possible D/C with transition to gym workouts next session  Person educated: Patient Education method: Explanation Education comprehension: verbalized understanding and returned demonstration   HOME EXERCISE PROGRAM Last updated: 12/15/21 Access Code: [email or web address]: [email or web address] Date: 12/15/2021 [facility]  Exercises - Standing Hamstring Curl with Resistance  - 1 x daily - 5 x weekly - 2 sets - 10 reps - Forward Step Down  - 1 x daily - 5 x weekly - 2 sets - 10 reps - Squat with Chair Touch  - 1 x daily - 5 x weekly - 2  sets - 10 reps - Sidelying Hip Abduction  - 1 x daily - 5 x weekly - 2 sets - 10 reps - Sidelying Hip Adduction  - 1 x daily - 5 x weekly - 2 sets - 10 reps - Prone Hip Extension  - 1 x daily - 5 x weekly - 2 sets - 10 reps - Bird Dog  - 1 x daily - 5 x weekly - 2 sets - 10 reps - Full Plank  - 1 x daily - 5 x weekly - 3-5 reps - 10 sec hold - Full Plank on Knees  - 1 x daily - 5 x weekly - 3-5 reps - 10 sec hold     Below measures were taken at time of initial evaluation unless otherwise specified:   DIAGNOSTIC FINDINGS: 04/29/21 brain MRI: No contrast enhancement at the site of signal abnormality in the right frontal lobe. This remains highly concerning for tumor, most likely a low-grade glioma  COGNITION: Overall cognitive status: Within functional limits for tasks assessed   SENSATION: WFL  COORDINATION: Alternating pronation/supination: WNL Alternating toe tap: WNL Finger to nose: WNL    POSTURE: No Significant postural limitations  LOWER EXTREMITY ROM:     Active  Right Eval Left Eval  Hip flexion    Hip extension    Hip abduction    Hip adduction    Hip internal rotation    Hip external rotation    Knee flexion    Knee extension    Ankle dorsiflexion 18 14  Ankle plantarflexion    Ankle inversion    Ankle eversion     (Blank rows = not tested)  LOWER EXTREMITY MMT:    MMT (in sitting) Right Eval Left Eval Right 12/15/21 Left 12/15/21  Hip flexion 4+ 4 4+ 4+  Hip extension   4+ 4  Hip abduction 4+ 3+ 4+ 4  Hip adduction 4- 4- 4- 4-  Hip internal rotation      Hip external rotation      Knee flexion 4+ 4- 4+ 4-  Knee extension 4+ 4- 4+ 4-  Ankle dorsiflexion 4+ 4+ 5 4+  Ankle plantarflexion 4; 20 reps with compensations 4; 20 reps with compensations 5 4+ (20 reps with minor compensations)  Ankle inversion      Ankle eversion      (Blank rows = not tested) *MMT on 12/15/21 performed in standard positioning, not sitting  GAIT: Gait pattern:  [facility] Assistive device utilized: None Level of assistance: Complete Independence   FUNCTIONAL TESTs:  5 times sit to stand: 9.73 sec Functional gait assessment: 29      PATIENT EDUCATION: Education details: prognosis, POC, HEP Person educated: Patient Education method: Explanation, Demonstration, Tactile cues, Verbal cues, and Handouts Education comprehension: verbalized understanding and returned demonstration   HOME EXERCISE PROGRAM: Access Code: [email or web address] URL: [email or web address] Date: 11/07/2021 Prepared by: [facility]  Exercises - Marching with Resistance  - 1 x daily - 5 x weekly - 2 sets - 10 reps - Standing Hamstring Curl with Resistance  - 1 x daily - 5 x weekly - 2 sets - 10 reps - Mini Squat with Counter Support  - 1 x daily - 5 x weekly - 2 sets - 10 reps - Sidelying Hip Abduction  - 1 x daily - 5 x weekly - 2 sets - 10 reps - Supine Bridge with Mini Swiss Ball Between Knees  - 1 x daily - 5 x weekly - 2 sets - 10 reps    GOALS: Goals reviewed with patient? Yes  SHORT TERM GOALS: Target date: 11/28/2021  Patient to be independent with initial HEP. Baseline: HEP initiated Goal status: MET 11/14/21    LONG TERM GOALS: Target date: 01/12/2022  Patient to be independent with advanced HEP. Baseline: Not yet initiated; reported compliance and understanding 12/15/21 Goal status: IN PROGRESS 12/15/21  Patient to demonstrate B LE strength >/=4/5.  Baseline: See above; improving, see above 12/15/21 Goal status: IN PROGRESS 12/15/21  Patient to demonstrate good running form and stability on ground or treadmill.  Baseline: NT; reduced L step length on L foot 12/15/21 Goal status: IN PROGRESS 12/15/21    ASSESSMENT:  CLINICAL IMPRESSION: Patient arrived to session without new complaints. Worked on gait on inclined treadmill which revealed improved L foot clearance compared to previous sessions. Trialed  retro gait on treadmill which was limited by coordination- better success on ground floor. Also able to provide dual tasking activities with gait today. Able to perform more challenging body weight activities with improved awareness of body positioning and form. Patient tolerated session well and without complaints upon leaving.     OBJECTIVE IMPAIRMENTS decreased strength.   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   REHAB POTENTIAL: Good  CLINICAL DECISION MAKING: Evolving/moderate complexity  EVALUATION COMPLEXITY: Moderate  PLAN: PT FREQUENCY: 1x/week  PT DURATION: 4 weeks  PLANNED INTERVENTIONS: Therapeutic exercises, Therapeutic activity, Neuromuscular re-education, Balance training, Gait training, Patient/Family education, Self Care, Joint mobilization, Joint manipulation, Stair training, Vestibular training, Canalith repositioning, Visual/preceptual remediation/compensation, Aquatic Therapy, Dry Needling, Electrical stimulation, Cryotherapy, Moist heat, Taping, Manual therapy, and  Re-evaluation  PLAN FOR NEXT SESSION:  progress L LE strengthening, increasing step length on L; coordination, balance    Janene Harvey, PT, DPT 01/02/22 2:45 PM  [facility] [address], [place], [place] [address] Phone # [phone] Fax # [phone]

## 2022-01-02 ENCOUNTER — Ambulatory Visit: Payer: 59

## 2022-01-02 ENCOUNTER — Ambulatory Visit: Payer: 59 | Admitting: Occupational Therapy

## 2022-01-02 ENCOUNTER — Encounter: Payer: Self-pay | Admitting: Internal Medicine

## 2022-01-02 ENCOUNTER — Ambulatory Visit: Payer: 59 | Admitting: Physical Therapy

## 2022-01-02 ENCOUNTER — Encounter: Payer: Self-pay | Admitting: Physical Therapy

## 2022-01-02 DIAGNOSIS — M6281 Muscle weakness (generalized): Secondary | ICD-10-CM

## 2022-01-02 DIAGNOSIS — R41841 Cognitive communication deficit: Secondary | ICD-10-CM | POA: Diagnosis not present

## 2022-01-02 DIAGNOSIS — R278 Other lack of coordination: Secondary | ICD-10-CM

## 2022-01-02 MED ORDER — TRAZODONE HCL 100 MG PO TABS: 100.0000 mg | ORAL_TABLET | Freq: Every day | ORAL | 2 refills | Status: DC

## 2022-01-02 NOTE — Therapy (Signed)
OUTPATIENT SPEECH LANGUAGE PATHOLOGY TREATMENT SESSION   Patient Name: Joanne Jackson MRN: [id number] DOB:05/15/1975, 46 y.o., female Today's Date: 10/13/2021  PCP: Leamon Arnt., MD REFERRING PROVIDER: Kathyrn Drown, MD    End of Session - 01/02/22 1709     Visit Number 13    Number of Visits 25    Date for SLP Re-Evaluation 01/05/22    Authorization - Number of Visits 40    SLP Start Time 1534    SLP Stop Time  3762    SLP Time Calculation (min) 41 min    Activity Tolerance Patient tolerated treatment well                      Past Medical History:  Diagnosis Date   Brain cancer ([place])    Glioma ([place]) 05/18/2021   Hypertension    Pre-diabetes    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[place])    Past Surgical History:  Procedure Laterality Date   BRAIN SURGERY  05/2021   tumor removal   CESAREAN SECTION     DILATION AND CURETTAGE, DIAGNOSTIC / THERAPEUTIC     Patient Active Problem List   Diagnosis Date Noted   Moderate obstructive sleep apnea 10/31/2021   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   Dyslipidemia 04/30/2021   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[place]) 04/30/2021   Episode of transient neurologic symptoms 04/28/2021   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[place]) 10/25/2020   History of gestational diabetes 10/25/2020   Iron deficiency anemia 12/03/2019   Tubular adenoma of colon 10/24/2018   Essential hypertension 12/26/2017   Prediabetes 12/26/2017   Intramural leiomyoma of uterus 07/29/2015    ONSET DATE: 05/18/21   REFERRING DIAG: D49.6 (ICD-10-CM) - Neoplasm of unspecified behavior of brain   THERAPY DIAG:  Cognitive communication deficit  Rationale for Evaluation and Treatment Rehabilitation  SUBJECTIVE:   SUBJECTIVE STATEMENT: "I don't forget things as much when we go out of the house - I have a sticky next to the door." Pt accompanied by: self  PERTINENT HISTORY: Patient  presented to medical attention in March 2023 with new onset seizures, characterized by shaking of the left hand without impaired awareness. CNS imaging demonstrated non enhancing mass within right frontal lobe, c/w primary neoplasm. She underwent craniotomy, resection on 05/18/21 with Dr. Tommi Rumps at [facility]; pathology was High Grade Glioma IDHmt. Since surgery, husband has noticed more blunted (depressed?) mood, less responsive and engaging. Otherwise she is fully independent and functional. Referred to [facility] from [facility] for local implementation of radiation and chemotherapy. Last day of IMRT and Temodar yesterday (07-31-21). Pt had HHST upon d/c from [facility].  PAIN:  Are you having pain? No  PATIENT GOALS  None stated today when SLP asked, due to decr'd intellectual awareness.   OBJECTIVE:  TODAY'S TREATMENT:  01/02/22: Pt corrected her homework - all 4 sheets, and accuracy was 98% - one error in 54 responses. With regard to her one error, [name] said , "I think I just ignored (the rest of the question)." Pt has noticed she is not forgetting things as often when they leave the house (see "s" statement). Pt did not think of anything in last 10 days that was different than prior to chemo, and/or was disturbing to her. She agreed d/c appropriate in next 1-2 sessions.  12/22/21: Pt did NOT double check her answers on her homework and got 70% correct. SLP STRONGLY encouraged pt to double check her answers. Pt's last day  of Temodar is tomorrow. Pt acknowledges feeling very tired today.  12/11/21: Pt accuracy with homework was 26/27 (96%). She did NOT double check her answers, which SLP again reminded pt to do prior to turning in homework. She said she thought about it last night but hsould have set a reminder to do it today. Alternating attention during session with checking work and conversation WNL.  SLP inquired about pt's meal prep as she is primary cook in the household. Pt told SLP she does some of the prep at  lunchtime and then the rest at dinner - modification of the same meal with meat to accompany veggies and rice. Pt has not left stove on recently; When she did that the last time (>60 days ago), she noted bottom of pot was hot from the stovetop and immediately shut the stove off.  11/29/21: SLP corrected pt homework with her - 46/51 success. Pt did NOT double check her answers - SLP reminded (again) to ALWAYS double check her answers. SLP inquired about if pt's decr'd attetion/attention to detail was problematic in the last two weeks and pt described a situation where she gave 40% tip due to distraction with how she put her card in card reader at hair salon. Pt was able to tell SLP how to change this situation next time without cues.   11/16/21: Pt returned today because sleep a little better regulated as 3-4 hours/night. Pt will cont to undergo her chemo cycle until middle of next year and then plan will change. She agrees with SLP that the further she progresses away from last day of chemo, then gets worse when chemo cycle begins again. Pt is compensating with appropriate compensations for memory, continues to make errors rarely - pt gave example of email details, but always cc:'s husband to have second set of eyes in order to correct her if necessary. She is paying some bills with double checking/supervision from E, she is using alarms (for meds) and timers, and is taking brain breaks when necessary for as long as she can up to 20 minutes. Pt agreed with SLP that frequency could be x1 every other week with d/c in 2-3 visits, depending on pt status. SLP provided pt with some homework for detailed work and error awareness.   10/13/21: Pt returned PROM today; results below in "PROM". SLP and pt discussed whether or not pt should cont with ST in the next 2-3 weeks given so little sleep the last 7-8 days. Pt reports being very tired today and SLP believes this will impede pt's performance today and her progress in  the future until her sleep gets better regulated. Pt agreed to cx September appointments to allow her sleep to get regulated better - a sleep study is currently undergoing a long approval process from pt's insurance, pt reports.  Today, SLP assisted pt in detailed functional written language tasks in which pt req'd min-mod cues occasionally for error awareness and for alternating attention.   10/05/21: Pt brought in written homework with many erase marks. "These were hard," pt stated. SLP gave pt "code" homework task which req'd her to alternate attention between response and stimuli. Pt stated she, eventually, used a straight edge as a guide and this was less difficult than no guide. Today she alternated attention WFL/WNL between detailed written tasks with cues for attention to detail. When she double-checked her answers she found all but one of her mistakes. SLP had door open for mod noise and pt req'd cue  for "brain break" but she did not take one until SLP cued a second time; "I was still working," pt stated. She took 3 minute break and went back to tasks. SLP provided pt and husband PROM for cognition and asked pt return these next session. [name], at end of session today indicated she needs to use compensations for deficits in memory, for attention, and for mental fatigue.  09/26/21: Pt reports she will begin Temodar again tonight. Has possible sleep study tomorrow night. Homework completed in full, 100% success. Pt reported using sticky notes successfully for recall of turning lights off and bringing her glasses downstairs. Today SLP used practical written math problems to target attending to details. Pt req'd consistent min A to note errors; Did not check work immediately (problem after problem), and pt had 3/6 incorrect after her double checking. Pt noted to move very quickly with math and make errors due to decr'd alternating attention writing down all figures needed for accuracy. SLP encouraged pt to  follow with her finger to incr accuracy, and to cross off figures when she has written them down to aid in alternating attention.   The Cognitive Linguistic Quick Test (CLQT) was administered to assess the relative status of five cognitive domains: attention, memory, language, executive functioning, and visuospatial skills. Scores from 10 tasks were used to estimate severity ratings (standardized for age groups 18-69 years and 70-89 years) for each domain, a clock drawing task, as well as an overall composite severity rating of cognition.      Task Score Criterion Cut Scores  Personal Facts 8/8 8  Symbol Cancellation 12/12 11  Confrontation Naming 10/10 10  Clock Drawing  10/13 12  Story Retelling 8/10 6  Symbol Trails 7/10 9  Generative Naming 4/10 5  Design Memory 6/6 5  Mazes  4/8 7  Design Generation 8/13 6    Cognitive Domain Composite Score Severity Rating  Attention 181/215 WNL (low)  Memory 168/185 WNL  Executive Function 23/40 Mild  Language 30/37 WNL (low)  Visuospatial Skills 82/105 WNL (low)  Clock Drawing  10/13 Mild  Composite Severity Rating 3.8 WNL   Overall composite rating is "WNL" however on CLQT pt demonstrated decr'd skills in awareness, attention, executive function, and a lack of impulse control. Impulsivity decr'd pt's score specifically with clock drawing, mazes, and symbol trails.   (Date of eval): SLP discussed evaluation results thus far, and Constant Therapy was introduced and explained.     PATIENT REPORTED OUTCOME MEASURES (PROM): Cognitive Function: E scored [name] 31/40, and [name] scored herself 32/40 (higher numbers indicate better QOL)    PATIENT EDUCATION: Education details: See "today's treatment" Person educated: Patient Education method: Explanation Education comprehension: verbalized understanding and needs further education   GOALS: Goals reviewed with patient? Yes  SHORT TERM GOALS:  Target date: 11/10/2021 (extended due to pt  resuming ST in October)  Pt will demo [facility] selective attention with functional tasks in mod noisy environment for 20 minutes in 2 sessions  Baseline: 09/26/21 Goal status: met  2.  Pt will demo WFL alternating attention between billpay task and additional random tasks in 2 sessions  Baseline: 10/05/21 Goal status: not met  3.  Pt will acknowledge at least two areas of deficit during three therapy sessions  Baseline: 09/19/21, 09-26-21 Goal status: Met  4.  [name] will note 80% of errors in therapy tasks when given chance to self correct in 3 sessions Baseline: 09/19/21, 10-05-21 Goal status: not met  5.  [name] will reportedly demonstrate less frequent "absent-mindedness" in the home (doors open, lights on, etc) than when compared to prior to OP ST, as reported by pt or E  Baseline:  Goal status: Met  LONG TERM GOALS: Target date: 01/05/2022  [name] will score better on E's and her own Cognitive PROM than during her first therapy session Baseline:  Goal status: Ongoing  2.  Pt will demo functional alternating attention with mod complex tasks in therapy session x3 and will reportedly demo functional alternating attention at home as reported by pt or by E, x3  Baseline: 11-16-21, 11-29-21, 12-11-21 Goal status: Met  3.  [name] will indicate anticipatory awareness during the session or at home as reported by pt or E, x5 Baseline: 11/16/21, 11-29-21, 01-02-22 Goal status: Ongoing  4.  Pt will exhibit WFL organization and planning by paying bills independently Baseline: 11/16/21 Goal status: Met   ASSESSMENT:  CLINICAL IMPRESSION: Patient is a 46 y.o. female who was seen today for cognitive communication tx. SEE THERAPY NOTE. [name] requires improved cognition for responsibilities at home, and is using compensatory strategies appropriately. Pt double checked her work prior to turning in to SLP this session. Probable d/c in 1-2 visits.  OBJECTIVE IMPAIRMENTS include attention, memory, awareness, and  executive functioning. These impairments are limiting patient from return to work, managing medications, managing appointments, managing finances, household responsibilities, and effectively communicating at home and in community. Factors affecting potential to achieve goals and functional outcome are ability to learn/carryover information and co-morbidities.. Patient will benefit from skilled SLP services to address above impairments and improve overall function.  REHAB POTENTIAL: Good  PLAN: SLP FREQUENCY: 2x/week  SLP DURATION: 12 weeks  PLANNED INTERVENTIONS: Language facilitation, Environmental controls, Cueing hierachy, Cognitive reorganization, Internal/external aids, Functional tasks, SLP instruction and feedback, and Compensatory strategies    Dianely Krehbiel, CCC-SLP 01/02/2022, 5:10 PM

## 2022-01-02 NOTE — Therapy (Signed)
OUTPATIENT OCCUPATIONAL THERAPY Treatment Note  Patient Name: Joanne Jackson MRN: [id number] DOB:September 20, 1975, 46 y.o., female Today's Date: 01/02/2022  PCP: Leamon Arnt, MD REFERRING PROVIDER: Ancil Boozer, FNP    OT End of Session - 01/02/22 1500     Visit Number 5    Number of Visits 7    Date for OT Re-Evaluation 01/12/22    Authorization Type [employer]    OT Start Time 1457   late start due to computer issues   OT Stop Time 1529    OT Time Calculation (min) 32 min                Past Medical History:  Diagnosis Date   Brain cancer ([place])    Glioma ([place]) 05/18/2021   Hypertension    Pre-diabetes    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[place])    Past Surgical History:  Procedure Laterality Date   BRAIN SURGERY  05/2021   tumor removal   CESAREAN SECTION     DILATION AND CURETTAGE, DIAGNOSTIC / THERAPEUTIC     Patient Active Problem List   Diagnosis Date Noted   Moderate obstructive sleep apnea 10/31/2021   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   Dyslipidemia 04/30/2021   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[place]) 04/30/2021   Episode of transient neurologic symptoms 04/28/2021   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[place]) 10/25/2020   History of gestational diabetes 10/25/2020   Iron deficiency anemia 12/03/2019   Tubular adenoma of colon 10/24/2018   Essential hypertension 12/26/2017   Prediabetes 12/26/2017   Intramural leiomyoma of uterus 07/29/2015    ONSET DATE: referral date 11/08/21  REFERRING DIAG: C71.9 (ICD-10-CM) - Malignant neoplasm of brain, unspecified R53.1 (ICD-10-CM) - Weakness   THERAPY DIAG:  Muscle weakness (generalized)  Other lack of coordination  Rationale for Evaluation and Treatment Rehabilitation  SUBJECTIVE:   SUBJECTIVE STATEMENT: Pt reports that she does not have any difficulty with any tasks at home.  Pt accompanied by: self (husband drove pt to  appt)  PERTINENT HISTORY: R frontal glioma s/p resection 05/2021 and chemo/radiation, HTN, sjogren's   PRECAUTIONS: None  WEIGHT BEARING RESTRICTIONS: No  PAIN:  Are you having pain? No  FALLS: Has patient fallen in last 6 months? 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  OBJECTIVE:   HAND DOMINANCE: Right   UPPER EXTREMITY MMT:     MMT Right eval Left eval  Shoulder flexion 4+/5 4/5  Shoulder abduction    Shoulder adduction    Shoulder extension    Shoulder internal rotation    Shoulder external rotation    Middle trapezius    Lower trapezius    Elbow flexion 4+ 4  Elbow extension 4+ 4  Wrist flexion    Wrist extension    Wrist ulnar deviation    Wrist radial deviation    Wrist pronation    Wrist supination    (Blank rows = not tested)  HAND FUNCTION: Grip strength: Right: 42 lbs; Left: 25 lbs, Lateral pinch: Right: 17 lbs, Left: 12 lbs, and 3 point pinch: Right: 13 lbs, Left: 8 lbs  COORDINATION: Finger Nose Finger test: Symmetrical bilaterally, completed 11 in 10 secs on R, 10 in 10 secs on L 9 Hole Peg  test: Right: 21.25 sec; Left: 32.57 sec Box and Blocks:  Right 56 blocks, Left 45 blocks    TODAY'S TREATMENT:  01/02/22 Sci[employer] for 6 mins alternating 2 mins forward, 2 mins backward.  Completed on resistance level 4.0 with RPM from 14 to 24.  OT providing mod cues for increased pace to challenge endurance, however pt unable to keep up pace. 9 hole peg test: L: 24.81 sec Box and blocks: 54 Energy conservation: educated on 4 P's of energy conservation (planning, pacing, prioritizing, and positioning).  Discussed functional carryover both with ADLs, IADLs, and community reintegration.  Provided with  handout. Reaching: LUE and BUE reaching with weighted balls. Pt with increased difficulty with heavier ball, therefore utilizing BUE to compensate.  Discussed functional carryover to retrieving larger dishes or items from cabinets.  Completed overhead press and alternating diagonals x10 each with 2.2# medicine ball.   12/27/21 Shoulder ROM: crossing midline and reaching outside BOS, incorporating forward reaching and diagonal reaching to pick up and place rings on vertical dowel.  Pt demonstrating good motor control, weight shifting, and coordination. Increased challenge to completing in standing to further challenge balance, endurance, and reaching with LUE. Putty: OT provided pt with yellow theraputty and instructed pt with verbal cues and demonstration on grasp, pinch, and coordination activities.  Pt demonstrating good understanding with intermittent cues, especially with key pinch and pinching off small pieces of putty.    12/22/21 Coordination: engaged in small peg board pattern replication with LUE to focus on [facility], progressing to in-hand manipulation and translation.  Pt demonstrating good motor control when placing one at a time, however demonstrating min drops with increased challenge of translation from palm to finger tips.   9 hole peg test: 27.94 sec Box and Blocks: 46 Therapeutic exercises: Engaged in horizontal abduction, external rotation with scapular retraction, PNF D2 flexion/extension,  shoulder flexion, shoulder abduction, and scapular retraction with red theraband.  Pt completed x10 with each exercise.  OT providing min cues for technique, providing tactile cues to decrease shoulder hike during shoulder flexion and abduction in standing. Utilized Geologist, engineering for visual feedback to facilitate increased symmetry due to LUE weakness.    PATIENT EDUCATION: Education details: ongoing condition specific education Person educated: Patient Education method: Explanation, Demonstration, and  Handouts Education comprehension: verbalized understanding and needs further education  HOME EXERCISE PROGRAM: Fine motor coordination handout provided  Access Code: [email or web address] URL: [email or web address] Date: 12/22/2021 Prepared by: [facility]  Exercises - Shoulder Flexion Wall Slide with Towel  - 3 x weekly - 2 sets - 10 reps - Shoulder Scaption Wall Slide with Towel  - 3 x weekly - 2 sets - 10 reps - Standing Shoulder Horizontal Abduction with Resistance  - 3 x weekly - 2 sets - 10 reps - 3-5 hold - Shoulder External Rotation and Scapular Retraction with Resistance  - 3 x weekly - 2 sets - 10 reps - 3-5 hold - Standing Shoulder Single Arm PNF D2 Flexion with Resistance  - 3 x weekly - 2 sets - 10 reps - 3-5 hold - Standing Shoulder Flexion with Resistance  - 3 x weekly - 2 sets - 10 reps - 3-5 hold - Standing Single Arm Shoulder Abduction with Resistance  - 3-5 x daily - 3 x weekly - 2 sets - 10 reps - Scapular Retraction with Resistance  - 3 x weekly - 2 sets - 10 reps - 3-5 hold  Access Code: [email or web address] URL:  [email or web address] Date: 12/27/2021 Prepared by: [facility]  Exercises - Putty Squeezes  - 1 x daily - 1 sets - 10 reps - Thumb Opposition with Putty  - 1 x daily - 1 sets - 5 reps - 3-Point Pinch with Putty  - 1 x daily - 1 sets - 10 reps - Key Pinch with Putty  - 1 x daily - 1 sets - 10 reps - Finger Pinch and Pull with Putty  - 1 x daily - 1 sets - 10 reps - Tip Pinch with Putty  - 1 x daily - 1 sets - 10 reps - Removing Marbles from Putty  - 1-2 reps   GOALS: Goals reviewed with patient? Yes  SHORT TERM GOALS: Target date: 12/22/21    Status:  1 Pt will demonstrate improved fine motor coordination for ADLs as evidenced by decreasing 9 hole peg test score for LUE by 4 secs Baseline: Right: 21.25 sec; Left: 32.57 sec Met - 27.94 sec on L on 12/22/21  2 Pt will  demonstrate improved UE functional use for ADLs as evidenced by increasing box/ blocks score by 3 blocks with LUE Baseline: Right 56 blocks, Left 45 blocks Not met - 46 blocks on 12/22/21  3 Pt will be independent in strengthening and coordination HEP. Baseline:  Met - 12/22/21    LONG TERM GOALS: Target date: 01/12/22    Status:  1 Pt will demonstrate improved fine motor coordination for ADLs as evidenced by decreasing 9 hole peg test score for LUE by 7 secs Baseline: Right: 21.25 sec; Left: 32.57 sec Met - 24.81 sec on 01/02/22  2 Pt will demonstrate improved UE functional use for ADLs as evidenced by increasing box/ blocks score by 6 blocks with LUE Baseline: Right 56 blocks, Left 45 blocks Met - 54 on 01/02/22  3 Pt will demonstrate improved LUE strength as needed to engage in ADLs and IADLs Baseline:  Progressing  4 Pt will demonstrate improved functional reach in LUE to obtain moderate weight items at moderate range with LUE and/or BUE and transport to another surface as needed to engage in ADLs and IADLs Baseline:  Progressing  5 Pt will verbalize understanding of energy conservation strategies to increase independence and safety with ADLs and IADLs. Baseline:  Progressing    ASSESSMENT:  CLINICAL IMPRESSION: Pt demonstrating good tolerance to ROM incorporating functional reach to high and low surfaces with increased weight.  Pt requiring BUE with increased weight, but able to reach up to high range with LUE to retrieve medicine balls up to 4# with LUE, requiring BUE with > 4#.  Pt demonstrating improved fine and gross motor control on 9 hole peg test and box and blocks assessment.    PERFORMANCE DEFICITS: in functional skills including ADLs, IADLs, coordination, ROM, strength, FMC, GMC, balance, endurance, decreased knowledge of precautions, decreased knowledge of use of DME, and UE functional use.   IMPAIRMENTS: are limiting patient from ADLs, IADLs, and work.   CO-MORBIDITIES;  may have co-morbidities  that affects occupational performance. Patient will benefit from skilled OT to address above impairments and improve overall function.  MODIFICATION OR ASSISTANCE TO COMPLETE EVALUATION: No modification of tasks or assist necessary to complete an evaluation.  OT OCCUPATIONAL PROFILE AND HISTORY: Detailed assessment: Review of records and additional review of physical, cognitive, psychosocial history related to current functional performance.  CLINICAL DECISION MAKING: LOW - limited treatment options, no task modification necessary  REHAB POTENTIAL:  Good  EVALUATION COMPLEXITY: Low    PLAN:  OT FREQUENCY: 1x/week  OT DURATION: 6 weeks  PLANNED INTERVENTIONS: self care/ADL training, therapeutic exercise, therapeutic activity, neuromuscular re-education, balance training, functional mobility training, aquatic therapy, patient/family education, psychosocial skills training, energy conservation, coping strategies training, and DME and/or AE instructions  RECOMMENDED OTHER SERVICES: N/A  CONSULTED AND AGREED WITH PLAN OF CARE: Patient  PLAN FOR NEXT SESSION: Engage in functional reaching task, Review strengthening HEP for UE with theraband - possibly attempt with increased resistance band.  D/c after next session.   Simonne Come, OTR/L 01/02/2022, 3:43 PM

## 2022-01-09 NOTE — Therapy (Signed)
OUTPATIENT PHYSICAL THERAPY NEURO DISCHARGE   Patient Name: Joanne Jackson MRN: [id number] DOB:[birth year], 46 y.o., female Today's Date: 01/10/2022   PCP: Leamon Arnt, MD REFERRING PROVIDER: Ancil Boozer, FNP    PT End of Session - 01/10/22 1525     Visit Number 10    Number of Visits 10    Date for PT Re-Evaluation 01/12/22    Authorization Type [employer]    Authorization - Number of Visits 60   combined   PT Start Time 5176    PT Stop Time 1524    PT Time Calculation (min) 38 min    Activity Tolerance Patient tolerated treatment well    Behavior During Therapy WFL for tasks assessed/performed                      Past Medical History:  Diagnosis Date   Brain cancer ([place])    Glioma ([place]) 05/18/2021   Hypertension    Pre-diabetes    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[place])    Past Surgical History:  Procedure Laterality Date   BRAIN SURGERY  05/2021   tumor removal   CESAREAN SECTION     DILATION AND CURETTAGE, DIAGNOSTIC / THERAPEUTIC     Patient Active Problem List   Diagnosis Date Noted   Moderate obstructive sleep apnea 10/31/2021   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   Dyslipidemia 04/30/2021   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[place]) 04/30/2021   Episode of transient neurologic symptoms 04/28/2021   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[place]) 10/25/2020   History of gestational diabetes 10/25/2020   Iron deficiency anemia 12/03/2019   Tubular adenoma of colon 10/24/2018   Essential hypertension 12/26/2017   Prediabetes 12/26/2017   Intramural leiomyoma of uterus 07/29/2015    ONSET DATE: 05/2021  REFERRING DIAG: C71.9 (ICD-10-CM) - Malignant neoplasm of brain, unspecified  THERAPY DIAG:  Muscle weakness (generalized)  Rationale for Evaluation and Treatment Rehabilitation  SUBJECTIVE:                                                                                                                                                                                               SUBJECTIVE STATEMENT: "I think I'm doing okay." Pt accompanied by: self  PERTINENT HISTORY: R frontal glioma s/p resection 05/2021 and chemo/radiation, HTN, sjogren's  PAIN:  Are you having pain? No  PRECAUTIONS: None  PATIENT GOALS improve strength in L side   OBJECTIVE:     TODAY'S TREATMENT: 01/10/22 Activity Comments  Treadmill walk at 3.0 mph for 3 min, jog at 3.6 mph 1 min/walk at  2.3 mph 1 min until a total of 10 min on treadmill Improved L foot clearance; encouraged arm swing and staying light on feet with running; L foot catching on TM belt more evident with fatigue     LOWER EXTREMITY MMT:    MMT (in sitting) Right 12/15/21 Left 12/15/21 Right 01/10/22 Left 01/10/22  Hip flexion 4+ 4+ 4+ 4+  Hip extension 4+ 4 4+ 4  Hip abduction 4+ _0 Hip adduction 4- 4- 4 4-  Hip internal rotation      Hip external rotation      Knee flexion 4+ 4- 5 4+  Knee extension 4+ 4- 4+ 4  Ankle dorsiflexion 5 4+    Ankle plantarflexion 5 4+ (20 reps with minor compensations)    Ankle inversion      Ankle eversion      (Blank rows = not tested) *MMT on 12/15/21 performed in standard positioning, not sitting   PATIENT EDUCATION: Education details: discussion on objective progress towards goals and remaining impairments; HEP update/consolidation, edu on different gym machines to continue working on [employer] strength  Person educated: Patient Education method: Consulting civil engineer, Media planner, Corporate treasurer cues, Verbal cues, and Handouts Education comprehension: verbalized understanding    HOME EXERCISE PROGRAM Last updated: 01/10/22 Access Code: [email or web address] URL: [email or web address] Date: 01/10/2022 Prepared by: [facility]  Exercises - Forward Step Down  - 1 x daily - 5 x weekly - 2 sets - 10 reps - Squat with Chair Touch  - 1 x daily - 5 x weekly - 2  sets - 10 reps - Sidelying Hip Abduction  - 1 x daily - 5 x weekly - 2 sets - 10 reps - Sidelying Hip Adduction  - 1 x daily - 5 x weekly - 2 sets - 10 reps - Prone Hip Extension  - 1 x daily - 5 x weekly - 2 sets - 10 reps - Bird Dog  - 1 x daily - 5 x weekly - 2 sets - 10 reps - Full Plank on Knees  - 1 x daily - 5 x weekly - 3-5 reps - 10 sec hold     Below measures were taken at time of initial evaluation unless otherwise specified:   DIAGNOSTIC FINDINGS: 04/29/21 brain MRI: No contrast enhancement at the site of signal abnormality in the right frontal lobe. This remains highly concerning for tumor, most likely a low-grade glioma  COGNITION: Overall cognitive status: Within functional limits for tasks assessed   SENSATION: WFL  COORDINATION: Alternating pronation/supination: WNL Alternating toe tap: WNL Finger to nose: WNL    POSTURE: No Significant postural limitations  LOWER EXTREMITY ROM:     Active  Right Eval Left Eval  Hip flexion    Hip extension    Hip abduction    Hip adduction    Hip internal rotation    Hip external rotation    Knee flexion    Knee extension    Ankle dorsiflexion 18 14  Ankle plantarflexion    Ankle inversion    Ankle eversion     (Blank rows = not tested)  LOWER EXTREMITY MMT:    MMT (in sitting) Right Eval Left Eval Right 12/15/21 Left 12/15/21  Hip flexion 4+ 4 4+ 4+  Hip extension   4+ 4  Hip abduction 4+ 3+ 4+ 4  Hip adduction 4- 4- 4- 4-  Hip internal rotation      Hip external rotation  Knee flexion 4+ 4- 4+ 4-  Knee extension 4+ 4- 4+ 4-  Ankle dorsiflexion 4+ 4+ 5 4+  Ankle plantarflexion 4; 20 reps with compensations 4; 20 reps with compensations 5 4+ (20 reps with minor compensations)  Ankle inversion      Ankle eversion      (Blank rows = not tested) *MMT on 12/15/21 performed in standard positioning, not sitting  GAIT: Gait pattern: [employer] Assistive device utilized: None Level of assistance:  Complete Independence   FUNCTIONAL TESTs:  5 times sit to stand: 9.73 sec Functional gait assessment: 29      PATIENT EDUCATION: Education details: prognosis, POC, HEP Person educated: Patient Education method: Explanation, Demonstration, Tactile cues, Verbal cues, and Handouts Education comprehension: verbalized understanding and returned demonstration   HOME EXERCISE PROGRAM: Access Code: [email or web address] URL: [email or web address] Date: 11/07/2021 Prepared by: [facility]  Exercises - Marching with Resistance  - 1 x daily - 5 x weekly - 2 sets - 10 reps - Standing Hamstring Curl with Resistance  - 1 x daily - 5 x weekly - 2 sets - 10 reps - Mini Squat with Counter Support  - 1 x daily - 5 x weekly - 2 sets - 10 reps - Sidelying Hip Abduction  - 1 x daily - 5 x weekly - 2 sets - 10 reps - Supine Bridge with Mini Swiss Ball Between Knees  - 1 x daily - 5 x weekly - 2 sets - 10 reps    GOALS: Goals reviewed with patient? Yes  SHORT TERM GOALS: Target date: 11/28/2021  Patient to be independent with initial HEP. Baseline: HEP initiated Goal status: MET 11/14/21    LONG TERM GOALS: Target date: 01/12/2022  Patient to be independent with advanced HEP. Baseline: Not yet initiated; reported compliance and understanding 12/15/21 Goal status: MET 01/10/22  Patient to demonstrate B LE strength >/=4/5.  Baseline: See above; improving, see above 12/15/21; met with exception of L hip adduction Goal status: PARTIALLY MET 01/10/22  Patient to demonstrate good running form and stability on ground or treadmill.  Baseline: NT; reduced L step length on L foot 12/15/21; much improved 01/10/22 Goal status: MET 01/10/22    ASSESSMENT:  CLINICAL IMPRESSION: Patient arrived to session without new complaints. Strength testing revealed improvement in R hip adduction, B knee flexion, L knee extension. Remaining weakness evident in B hip  abduction/adduction, extension and L knee extension. Treadmill walking quality has improved as patient less often drags the L foot during ambulation. Running form still requires some cueing for use of arm swing, improving shock absorption to avoid heavy heel strike, however also with improved foot clearance on L. Thoroughly reviewed and consolidated HEP for max benefit and spoke to patient about machine strengthening at gym for continued L LE strengthening. Patient has met or partially met all goals at this time and is ready for transition to HEP and gym.     OBJECTIVE IMPAIRMENTS decreased strength.   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   REHAB POTENTIAL: Good  CLINICAL DECISION MAKING: Evolving/moderate complexity  EVALUATION COMPLEXITY: Moderate  PLAN: PT FREQUENCY: 1x/week  PT DURATION: 4 weeks  PLANNED INTERVENTIONS: Therapeutic exercises, Therapeutic activity, Neuromuscular re-education, Balance training, Gait training,  Patient/Family education, Self Care, Joint mobilization, Joint manipulation, Stair training, Vestibular training, Canalith repositioning, Visual/preceptual remediation/compensation, Aquatic Therapy, Dry Needling, Electrical stimulation, Cryotherapy, Moist heat, Taping, Manual therapy, and Re-evaluation  PLAN FOR NEXT SESSION:  DC at this time    PHYSICAL THERAPY DISCHARGE SUMMARY  Visits from Start of Care: 10  Current functional level related to goals / functional outcomes: See above clinical impression   Remaining deficits: Remaining L LE weakness   Education / Equipment: HEP  Plan: Patient agrees to discharge.  Patient goals  were partially met. Patient is being discharged due to meeting the stated rehab goals.        Janene Harvey, PT, DPT 01/10/22 3:30 PM  [facility] at [facility] [address] [place], [place], [place] [address] Phone # [phone] Fax # [phone]

## 2022-01-10 ENCOUNTER — Ambulatory Visit: Payer: 59 | Admitting: Physical Therapy

## 2022-01-10 ENCOUNTER — Encounter: Payer: Self-pay | Admitting: Physical Therapy

## 2022-01-10 ENCOUNTER — Ambulatory Visit: Payer: 59 | Attending: Neurological Surgery | Admitting: Occupational Therapy

## 2022-01-10 DIAGNOSIS — M6281 Muscle weakness (generalized): Secondary | ICD-10-CM | POA: Insufficient documentation

## 2022-01-10 DIAGNOSIS — R41841 Cognitive communication deficit: Secondary | ICD-10-CM | POA: Diagnosis present

## 2022-01-10 DIAGNOSIS — R278 Other lack of coordination: Secondary | ICD-10-CM | POA: Diagnosis present

## 2022-01-10 NOTE — Therapy (Signed)
OUTPATIENT OCCUPATIONAL THERAPY Treatment Note & Discharge  Patient Name: Joanne Jackson MRN: [id number] DOB:October 05, 1975, 46 y.o., female Today's Date: 01/10/2022  PCP: Leamon Arnt, MD REFERRING PROVIDER: [place], Kris Hartmann, [place] THERAPY DISCHARGE SUMMARY  Visits from Start of Care: 6  Current functional level related to goals / functional outcomes: Pt demonstrating improvements in LUE shoulder and elbow MMT from 4/5 to 4+/5.  Pt demonstrating understanding of strengthening exercises with theraband and theraputty for hand and whole arm strengthening.  Pt demonstrating improvements in fine and gross motor control as demonstrated by improvements in 9 hole peg test and box and blocks assessment.   Remaining deficits: Left grip strength 25#, compared to 40# on Right   Education / Equipment: Educated on energy conservation strategies; Fine and gross motor coordination HEP, strengthening HEP for hand and shoulder.     Patient agrees to discharge. Patient goals were met. Patient is being discharged due to meeting the stated rehab goals..      OT End of Session - 01/10/22 1617     Visit Number 6    Number of Visits 7    Date for OT Re-Evaluation 01/12/22    Authorization Type [employer]    OT Start Time 4098    OT Stop Time 1191    OT Time Calculation (min) 25 min                 Past Medical History:  Diagnosis Date   Brain cancer ([place])    Glioma (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[place])  04/30/2021   Episode of transient neurologic symptoms 04/28/2021   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[place]) 10/25/2020   History of gestational diabetes 10/25/2020   Iron deficiency anemia 12/03/2019   Tubular adenoma of colon 10/24/2018   Essential hypertension 12/26/2017   Prediabetes 12/26/2017   Intramural leiomyoma of uterus 07/29/2015    ONSET DATE: referral date 11/08/21  REFERRING DIAG: C71.9 (ICD-10-CM) - Malignant neoplasm of brain, unspecified R53.1 (ICD-10-CM) - Weakness   THERAPY DIAG:  Muscle weakness (generalized)  Other lack of coordination  Rationale for Evaluation and Treatment Rehabilitation  SUBJECTIVE:   SUBJECTIVE STATEMENT: Pt reports that she does not have any difficulty with any tasks at home.  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 No  FALLS: Has patient fallen in last 6 months? 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  OBJECTIVE:   HAND DOMINANCE: Right   UPPER EXTREMITY MMT:     MMT Right eval Left eval Left  01/10/22  Shoulder flexion 4+/5 4/5 4+/5  Shoulder abduction     Shoulder adduction     Shoulder extension     Shoulder internal rotation     Shoulder external rotation     Middle trapezius     Lower trapezius     Elbow flexion 4+ 4 4+/5  Elbow extension 4+ 4 4+/5  Wrist flexion     Wrist extension     Wrist ulnar deviation     Wrist radial deviation     Wrist pronation     Wrist supination     (Blank rows = not tested)  HAND FUNCTION: Grip strength: Right: 42 lbs; Left: 25  lbs, Lateral pinch: Right: 17 lbs, Left: 12 lbs, and 3 point pinch: Right: 13 lbs, Left: 8 lbs 01/10/22: Grip strength: Right: 40 lbs; Left: 25 lbs, Lateral pinch: R: 17 lbs , L: 14 lbs, and 3 point pinch: R: 12 lbs and L: 10 lbs  COORDINATION: Finger Nose Finger test: Symmetrical bilaterally, completed 11 in 10 secs on R, 10 in 10 secs on L 9 Hole Peg test: Right: 21.25 sec; Left: 32.57 sec Box and Blocks:  Right 56 blocks, Left 45 blocks 01/02/22: 9 hole peg test: L: 24.81 sec Box and blocks: L: 54 blocks   TODAY'S TREATMENT:  01/10/22 Reviewed theraband HEP, provided with green theraband.  Discussed increasing reps with red band for increased endurance and decreased reps with increased resistance of green theraband.   Strengthening: engaged in functional mobility while carrying 3.5# and 4.5# med balls, progressing to carrying 6# box with LUE and then BUE.  Pt required to pick up from lower and higher surfaces and transfer throughout treatment space to simulate retrieving larger dishes and grocery bags.  Pt with no LOB or difficulty retrieving items from high or low surfaces.    01/02/22 SciFit for 6 mins alternating 2 mins forward, 2 mins backward.  Completed on resistance level 4.0 with RPM from 14 to 24.  OT providing mod cues for increased pace to challenge endurance, however pt unable to keep up pace. 9 hole peg test: L: 24.81 sec Box and blocks: 54 Energy conservation: educated on 4 P's of energy conservation (planning, pacing, prioritizing, and positioning).  Discussed functional carryover both with ADLs, IADLs, and community reintegration.  Provided with handout. Reaching: LUE and BUE reaching with weighted balls. Pt with increased difficulty with heavier ball, therefore utilizing BUE to compensate.  Discussed functional carryover to retrieving larger dishes or items from cabinets.  Completed overhead press and alternating diagonals x10 each with 2.2# medicine  ball.   12/27/21 Shoulder ROM: crossing midline and reaching outside BOS, incorporating forward reaching and diagonal reaching to pick up and place rings on vertical dowel.  Pt demonstrating good motor control, weight shifting, and coordination. Increased challenge to completing in standing to further challenge balance, endurance, and reaching with LUE. Putty: OT provided pt with yellow theraputty and instructed pt with verbal cues and demonstration on grasp, pinch, and coordination activities.  Pt demonstrating good understanding with intermittent cues, especially with key pinch and pinching off small pieces of putty.   PATIENT EDUCATION: Education details: ongoing condition specific education Person educated: Patient Education method: Explanation, Demonstration, and Handouts Education comprehension: verbalized understanding and needs further education  HOME EXERCISE PROGRAM: Fine motor coordination handout provided  Access Code: [email or web address] URL: [email or web address] Date: 12/22/2021 Prepared by: [facility]  Exercises - Shoulder Flexion Wall Slide with Towel  - 3 x weekly - 2  sets - 10 reps - Shoulder Scaption Wall Slide with Towel  - 3 x weekly - 2 sets - 10 reps - Standing Shoulder Horizontal Abduction with Resistance  - 3 x weekly - 2 sets - 10 reps - 3-5 hold - Shoulder External Rotation and Scapular Retraction with Resistance  - 3 x weekly - 2 sets - 10 reps - 3-5 hold - Standing Shoulder Single Arm PNF D2 Flexion with Resistance  - 3 x weekly - 2 sets - 10 reps - 3-5 hold - Standing Shoulder Flexion with Resistance  - 3 x weekly - 2 sets - 10 reps - 3-5 hold - Standing Single Arm Shoulder Abduction with Resistance  - 3-5 x daily - 3 x weekly - 2 sets - 10 reps - Scapular Retraction with Resistance  - 3 x weekly - 2 sets - 10 reps - 3-5 hold  Access Code: [email or web address] URL: [email or web address] Date:  12/27/2021 Prepared by: [facility]  Exercises - Putty Squeezes  - 1 x daily - 1 sets - 10 reps - Thumb Opposition with Putty  - 1 x daily - 1 sets - 5 reps - 3-Point Pinch with Putty  - 1 x daily - 1 sets - 10 reps - Key Pinch with Putty  - 1 x daily - 1 sets - 10 reps - Finger Pinch and Pull with Putty  - 1 x daily - 1 sets - 10 reps - Tip Pinch with Putty  - 1 x daily - 1 sets - 10 reps - Removing Marbles from Putty  - 1-2 reps   GOALS: Goals reviewed with patient? Yes  SHORT TERM GOALS: Target date: 12/22/21    Status:  1 Pt will demonstrate improved fine motor coordination for ADLs as evidenced by decreasing 9 hole peg test score for LUE by 4 secs Baseline: Right: 21.25 sec; Left: 32.57 sec Met - 27.94 sec on L on 12/22/21  2 Pt will demonstrate improved UE functional use for ADLs as evidenced by increasing box/ blocks score by 3 blocks with LUE Baseline: Right 56 blocks, Left 45 blocks Not met - 46 blocks on 12/22/21  3 Pt will be independent in strengthening and coordination HEP. Baseline:  Met - 12/22/21    LONG TERM GOALS: Target date: 01/12/22    Status:  1 Pt will demonstrate improved fine motor coordination for ADLs as evidenced by decreasing 9 hole peg test score for LUE by 7 secs Baseline: Right: 21.25 sec; Left: 32.57 sec Met - 24.81 sec on 01/02/22  2 Pt will demonstrate improved UE functional use for ADLs as evidenced by increasing box/ blocks score by 6 blocks with LUE Baseline: Right 56 blocks, Left 45 blocks Met - 54 on 01/02/22  3 Pt will demonstrate improved LUE strength as needed to engage in ADLs and IADLs Baseline:  Met -01/10/22  4 Pt will demonstrate improved functional reach in LUE to obtain moderate weight items at moderate range with LUE and/or BUE and transport to another surface as needed to engage in ADLs and IADLs Baseline:  Met - 01/10/22  5 Pt will verbalize understanding of energy conservation strategies to  increase independence and safety with ADLs and IADLs. Baseline:  Met - 01/10/22    ASSESSMENT:  CLINICAL IMPRESSION: Pt demonstrating good tolerance to ROM incorporating functional reach to high and low surfaces with increased weight.  Pt requiring BUE with increased weight, but able to reach up to high  range with LUE to retrieve box from high shelf and floor up to 6# with BUE.  Reviewed functional tasks to continue to increase grasp strength with theraputty and functional grasp when picking up items, ie pots/pans, purse/briefcase, and grocery bags.  PERFORMANCE DEFICITS: in functional skills including ADLs, IADLs, coordination, ROM, strength, FMC, GMC, balance, endurance, decreased knowledge of precautions, decreased knowledge of use of DME, and UE functional use.   IMPAIRMENTS: are limiting patient from ADLs, IADLs, and work.   CO-MORBIDITIES; may have co-morbidities  that affects occupational performance. Patient will benefit from skilled OT to address above impairments and improve overall function.  MODIFICATION OR ASSISTANCE TO COMPLETE EVALUATION: No modification of tasks or assist necessary to complete an evaluation.  OT OCCUPATIONAL PROFILE AND HISTORY: Detailed assessment: Review of records and additional review of physical, cognitive, psychosocial history related to current functional performance.  CLINICAL DECISION MAKING: LOW - limited treatment options, no task modification necessary  REHAB POTENTIAL: Good  EVALUATION COMPLEXITY: Low    PLAN:  OT FREQUENCY: 1x/week  OT DURATION: 6 weeks  PLANNED INTERVENTIONS: self care/ADL training, therapeutic exercise, therapeutic activity, neuromuscular re-education, balance training, functional mobility training, aquatic therapy, patient/family education, psychosocial skills training, energy conservation, coping strategies training, and DME and/or AE instructions  RECOMMENDED OTHER SERVICES: N/A  CONSULTED AND AGREED WITH PLAN OF  CARE: Patient  PLAN FOR NEXT SESSION: D/C   Jyair Kiraly, [place], OTR/L 01/10/2022, 4:18 PM

## 2022-01-16 ENCOUNTER — Encounter: Payer: Self-pay | Admitting: Obstetrics & Gynecology

## 2022-01-16 ENCOUNTER — Ambulatory Visit: Payer: 59

## 2022-01-16 DIAGNOSIS — M6281 Muscle weakness (generalized): Secondary | ICD-10-CM | POA: Diagnosis not present

## 2022-01-16 DIAGNOSIS — R41841 Cognitive communication deficit: Secondary | ICD-10-CM

## 2022-01-16 NOTE — Therapy (Signed)
OUTPATIENT SPEECH LANGUAGE PATHOLOGY TREATMENT SESSION/DISCHARGE   Patient Name: Joanne Jackson MRN: [id number] DOB:1975/09/13, 46 y.o., female Today's Date: 10/13/2021  PCP: Leamon Arnt., MD REFERRING PROVIDER: Kathyrn Drown, MD    End of Session - 01/16/22 1731     Visit Number 14    Number of Visits 25    Date for SLP Re-Evaluation 01/05/22    Authorization - Number of Visits 37    SLP Start Time 2637    SLP Stop Time  8588    SLP Time Calculation (min) 40 min    Activity Tolerance Patient tolerated treatment well                       Past Medical History:  Diagnosis Date   Brain cancer ([place])    Glioma ([place]) 05/18/2021   Hypertension    Pre-diabetes    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[place])    Past Surgical History:  Procedure Laterality Date   BRAIN SURGERY  05/2021   tumor removal   CESAREAN SECTION     DILATION AND CURETTAGE, DIAGNOSTIC / THERAPEUTIC     Patient Active Problem List   Diagnosis Date Noted   Moderate obstructive sleep apnea 10/31/2021   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   Dyslipidemia 04/30/2021   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 Pt struggled consistently with detailed functional tasks for homework. SLP ensured pt know she needed to have E look at her work/sit with her to ensure accuracy. She made progress with memory when using memory strategies.   Remaining  deficits: Cognitive communication deficits.   Education / Equipment: Compensatory strategies.   Patient agrees to discharge. Patient goals were partially met. Patient is being discharged due to maximized rehab potential. .      ONSET DATE: 05/18/21   REFERRING DIAG: D49.6 (ICD-10-CM) - Neoplasm of unspecified behavior of brain   THERAPY DIAG:  Cognitive communication deficit  Rationale for Evaluation and Treatment Rehabilitation  SUBJECTIVE:   SUBJECTIVE STATEMENT: "Can I have homework?" Pt accompanied by: self  PERTINENT HISTORY: Patient presented to medical attention in March 2023 with new onset seizures, characterized by shaking of the left hand without impaired awareness. CNS imaging demonstrated non enhancing mass within right frontal lobe, c/w primary neoplasm. She underwent craniotomy, resection on 05/18/21 with Dr. 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 Since surgery, husband has noticed more blunted (depressed?) mood, less responsive and engaging. Otherwise she is fully independent and functional. Referred to [facility] from [facility] for local implementation of radiation and chemotherapy. Last day of IMRT and Temodar yesterday (07-31-21). Pt had HHST upon d/c from [facility].  PAIN:  Are you having pain? No  PATIENT GOALS  None stated today when SLP asked, due to decr'd intellectual awareness.   OBJECTIVE:  TODAY'S TREATMENT:  01/16/22: Pt stated she double checked her homework - accuracy was 75%. Pt's accuracy has been better than this in past two sessions. Pt reports that she has done detailed tasks with  E in the past two weeks and he has not needed to correct her. E did not look at her homework for today. In a novel min-mod complex detailed alternating attention task pt attempted to problem solve but req'd SLP assistance to do so. Pt did not have any comments or questions for SLP on this last day of therapy.  01/02/22: Pt corrected her homework - all 4 sheets, and  accuracy was 98% - one error in 54 responses. With regard to her one error, [name] said , "I think I just ignored (the rest of the question)." Pt has noticed she is not forgetting things as often when they leave the house (see "s" statement). Pt did not think of anything in last 10 days that was different than prior to chemo, and/or was disturbing to her. She agreed d/c appropriate in next 1-2 sessions.  12/22/21: Pt did NOT double check her answers on her homework and got 70% correct. SLP STRONGLY encouraged pt to double check her answers. Pt's last day of Temodar is tomorrow. Pt acknowledges feeling very tired today.  12/11/21: Pt accuracy with homework was 26/27 (96%). She did NOT double check her answers, which SLP again reminded pt to do prior to turning in homework. She said she thought about it last night but hsould have set a reminder to do it today. Alternating attention during session with checking work and conversation WNL.  SLP inquired about pt's meal prep as she is primary cook in the household. Pt told SLP she does some of the prep at lunchtime and then the rest at dinner - modification of the same meal with meat to accompany veggies and rice. Pt has not left stove on recently; When she did that the last time (>60 days ago), she noted bottom of pot was hot from the stovetop and immediately shut the stove off.  11/29/21: SLP corrected pt homework with her - 46/51 success. Pt did NOT double check her answers - SLP reminded (again) to ALWAYS double check her answers. SLP inquired about if pt's decr'd attetion/attention to detail was problematic in the last two weeks and pt described a situation where she gave 40% tip due to distraction with how she put her card in card reader at hair salon. Pt was able to tell SLP how to change this situation next time without cues.   11/16/21: Pt returned today because sleep a little better regulated as 3-4 hours/night. Pt will cont to undergo her chemo cycle until  middle of next year and then plan will change. She agrees with SLP that the further she progresses away from last day of chemo, then gets worse when chemo cycle begins again. Pt is compensating with appropriate compensations for memory, continues to make errors rarely - pt gave example of email details, but always cc:'s husband to have second set of eyes in order to correct her if necessary. She is paying some bills with double checking/supervision from E, she is using alarms (for meds) and timers, and is taking brain breaks when necessary for as long as she can up to 20 minutes. Pt agreed with SLP that frequency could be x1 every other week with d/c in 2-3 visits, depending on pt status. SLP provided pt with some homework for detailed work and error awareness.   10/13/21: Pt returned PROM today; results below in "PROM". SLP and pt discussed whether or not pt should cont with ST in the next 2-3 weeks given so little sleep the last  7-8 days. Pt reports being very tired today and SLP believes this will impede pt's performance today and her progress in the future until her sleep gets better regulated. Pt agreed to cx September appointments to allow her sleep to get regulated better - a sleep study is currently undergoing a long approval process from pt's insurance, pt reports.  Today, SLP assisted pt in detailed functional written language tasks in which pt req'd min-mod cues occasionally for error awareness and for alternating attention.   10/05/21: Pt brought in written homework with many erase marks. "These were hard," pt stated. SLP gave pt "code" homework task which req'd her to alternate attention between response and stimuli. Pt stated she, eventually, used a straight edge as a guide and this was less difficult than no guide. Today she alternated attention WFL/WNL between detailed written tasks with cues for attention to detail. When she double-checked her answers she found all but one of her mistakes. SLP had  door open for mod noise and pt req'd cue for "brain break" but she did not take one until SLP cued a second time; "I was still working," pt stated. She took 3 minute break and went back to tasks. SLP provided pt and husband PROM for cognition and asked pt return these next session. [name], at end of session today indicated she needs to use compensations for deficits in memory, for attention, and for mental fatigue.  09/26/21: Pt reports she will begin Temodar again tonight. Has possible sleep study tomorrow night. Homework completed in full, 100% success. Pt reported using sticky notes successfully for recall of turning lights off and bringing her glasses downstairs. Today SLP used practical written math problems to target attending to details. Pt req'd consistent min A to note errors; Did not check work immediately (problem after problem), and pt had 3/6 incorrect after her double checking. Pt noted to move very quickly with math and make errors due to decr'd alternating attention writing down all figures needed for accuracy. SLP encouraged pt to follow with her finger to incr accuracy, and to cross off figures when she has written them down to aid in alternating attention.   The Cognitive Linguistic Quick Test (CLQT) was administered to assess the relative status of five cognitive domains: attention, memory, language, executive functioning, and visuospatial skills. Scores from 10 tasks were used to estimate severity ratings (standardized for age groups 18-69 years and 70-89 years) for each domain, a clock drawing task, as well as an overall composite severity rating of cognition.      Task Score Criterion Cut Scores  Personal Facts 8/8 8  Symbol Cancellation 12/12 11  Confrontation Naming 10/10 10  Clock Drawing  10/13 12  Story Retelling 8/10 6  Symbol Trails 7/10 9  Generative Naming 4/10 5  Design Memory 6/6 5  Mazes  4/8 7  Design Generation 8/13 6    Cognitive Domain Composite Score Severity  Rating  Attention 181/215 WNL (low)  Memory 168/185 WNL  Executive Function 23/40 Mild  Language 30/37 WNL (low)  Visuospatial Skills 82/105 WNL (low)  Clock Drawing  10/13 Mild  Composite Severity Rating 3.8 WNL   Overall composite rating is "WNL" however on CLQT pt demonstrated decr'd skills in awareness, attention, executive function, and a lack of impulse control. Impulsivity decr'd pt's score specifically with clock drawing, mazes, and symbol trails.   (Date of eval): SLP discussed evaluation results thus far, and Constant Therapy was introduced and explained.  PATIENT REPORTED OUTCOME MEASURES (PROM): Cognitive Function: E scored [name] 31/40, and [name] scored herself 32/40 (higher numbers indicate better QOL)    PATIENT EDUCATION: Education details: See "today's treatment" Person educated: Patient Education method: Explanation Education comprehension: verbalized understanding and needs further education   GOALS: Goals reviewed with patient? Yes  SHORT TERM GOALS:  Target date: 11/10/2021 (extended due to pt resuming ST in October)  Pt will demo [facility] selective attention with functional tasks in mod noisy environment for 20 minutes in 2 sessions  Baseline: 09/26/21 Goal status: met  2.  Pt will demo WFL alternating attention between billpay task and additional random tasks in 2 sessions  Baseline: 10/05/21 Goal status: not met  3.  Pt will acknowledge at least two areas of deficit during three therapy sessions  Baseline: 09/19/21, 09-26-21 Goal status: Met  4.  [name] will note 80% of errors in therapy tasks when given chance to self correct in 3 sessions Baseline: 09/19/21, 10-05-21 Goal status: not met  5.  [name] will reportedly demonstrate less frequent "absent-mindedness" in the home (doors open, lights on, etc) than when compared to prior to OP ST, as reported by pt or E  Baseline:  Goal status: Met  LONG TERM GOALS: Target date: 01/05/2022  [name] will score better on  E's and her own Cognitive PROM than during her first therapy session Baseline:  Goal status: Ongoing  2.  Pt will demo functional alternating attention with mod complex tasks in therapy session x3 and will reportedly demo functional alternating attention at home as reported by pt or by E, x3  Baseline: 11-16-21, 11-29-21, 12-11-21 Goal status: Met  3.  [name] will indicate anticipatory awareness during the session or at home as reported by pt or E, x5 Baseline: 11/16/21, 11-29-21, 01-02-22, 01-16-22 Goal status: Partially met  4.  Pt will exhibit WFL organization and planning by paying bills independently Baseline: 11/16/21 Goal status: Met   ASSESSMENT:  CLINICAL IMPRESSION: Patient is a 46 y.o. female who was seen today for cognitive communication tx. SEE THERAPY NOTE. [name] requires improved cognition for responsibilities at home, and is using compensatory strategies appropriately. Pt double checked her work prior to turning in to SLP this session. Probable d/c in 1-2 visits.  OBJECTIVE IMPAIRMENTS include attention, memory, awareness, and executive functioning. These impairments are limiting patient from return to work, managing medications, managing appointments, managing finances, household responsibilities, and effectively communicating at home and in community. Factors affecting potential to achieve goals and functional outcome are ability to learn/carryover information and co-morbidities.. Patient will benefit from skilled SLP services to address above impairments and improve overall function.  REHAB POTENTIAL: Good  PLAN: Discharge today.  PLANNED INTERVENTIONS: Language facilitation, Environmental controls, Cueing hierachy, Cognitive reorganization, Internal/external aids, Functional tasks, SLP instruction and feedback, and Compensatory strategies    [place], [place] 01/16/2022, 5:31 PM

## 2022-01-17 NOTE — Telephone Encounter (Signed)
Patient needs help with medical release.

## 2022-01-19 DIAGNOSIS — Z1231 Encounter for screening mammogram for malignant neoplasm of breast: Secondary | ICD-10-CM

## 2022-01-23 ENCOUNTER — Other Ambulatory Visit: Payer: Self-pay | Admitting: Internal Medicine

## 2022-01-23 DIAGNOSIS — C719 Malignant neoplasm of brain, unspecified: Secondary | ICD-10-CM

## 2022-01-23 NOTE — Telephone Encounter (Signed)
Patient will have labs and MD visit prior to refilling oral chemo.

## 2022-01-24 ENCOUNTER — Other Ambulatory Visit: Payer: Self-pay

## 2022-01-24 ENCOUNTER — Encounter: Payer: Self-pay | Admitting: Obstetrics & Gynecology

## 2022-01-24 NOTE — Telephone Encounter (Signed)
After several failed attempts to reach this patient. I have mailed her a letter today. I will close this encounter

## 2022-01-25 ENCOUNTER — Inpatient Hospital Stay: Payer: 59 | Attending: Radiation Oncology

## 2022-01-25 ENCOUNTER — Inpatient Hospital Stay (HOSPITAL_BASED_OUTPATIENT_CLINIC_OR_DEPARTMENT_OTHER): Payer: 59 | Admitting: Internal Medicine

## 2022-01-25 ENCOUNTER — Other Ambulatory Visit: Payer: Self-pay | Admitting: Internal Medicine

## 2022-01-25 VITALS — BP 136/90 | HR 65 | Temp 98.0°F | Resp 15 | Ht 60.25 in | Wt 151.9 lb

## 2022-01-25 DIAGNOSIS — Z8349 Family history of other endocrine, nutritional and metabolic diseases: Secondary | ICD-10-CM | POA: Diagnosis not present

## 2022-01-25 DIAGNOSIS — M3500 Sicca syndrome, unspecified: Secondary | ICD-10-CM | POA: Diagnosis not present

## 2022-01-25 DIAGNOSIS — Z8249 Family history of ischemic heart disease and other diseases of the circulatory system: Secondary | ICD-10-CM | POA: Insufficient documentation

## 2022-01-25 DIAGNOSIS — I10 Essential (primary) hypertension: Secondary | ICD-10-CM | POA: Diagnosis not present

## 2022-01-25 DIAGNOSIS — Z79899 Other long term (current) drug therapy: Secondary | ICD-10-CM | POA: Diagnosis not present

## 2022-01-25 DIAGNOSIS — C711 Malignant neoplasm of frontal lobe: Secondary | ICD-10-CM | POA: Diagnosis present

## 2022-01-25 DIAGNOSIS — R251 Tremor, unspecified: Secondary | ICD-10-CM | POA: Insufficient documentation

## 2022-01-25 DIAGNOSIS — Z79630 Long term (current) use of alkylating agent: Secondary | ICD-10-CM | POA: Diagnosis not present

## 2022-01-25 DIAGNOSIS — C719 Malignant neoplasm of brain, unspecified: Secondary | ICD-10-CM

## 2022-01-25 DIAGNOSIS — R112 Nausea with vomiting, unspecified: Secondary | ICD-10-CM | POA: Diagnosis not present

## 2022-01-25 LAB — CBC WITH DIFFERENTIAL (CANCER CENTER ONLY)
Abs Immature Granulocytes: 0.01 10*3/uL (ref 0.00–0.07)
Basophils Absolute: 0 10*3/uL (ref 0.0–0.1)
Basophils Relative: 0 %
Eosinophils Absolute: 0 10*3/uL (ref 0.0–0.5)
Eosinophils Relative: 1 %
HCT: 36.9 % (ref 36.0–46.0)
Hemoglobin: 13.2 g/dL (ref 12.0–15.0)
Immature Granulocytes: 0 %
Lymphocytes Relative: 9 %
Lymphs Abs: 0.4 10*3/uL — ABNORMAL LOW (ref 0.7–4.0)
MCH: 31 pg (ref 26.0–34.0)
MCHC: 35.8 g/dL (ref 30.0–36.0)
MCV: 86.6 fL (ref 80.0–100.0)
Monocytes Absolute: 0.3 10*3/uL (ref 0.1–1.0)
Monocytes Relative: 7 %
Neutro Abs: 4 10*3/uL (ref 1.7–7.7)
Neutrophils Relative %: 83 %
Platelet Count: 171 10*3/uL (ref 150–400)
RBC: 4.26 MIL/uL (ref 3.87–5.11)
RDW: 12.1 % (ref 11.5–15.5)
WBC Count: 4.8 10*3/uL (ref 4.0–10.5)
nRBC: 0 % (ref 0.0–0.2)

## 2022-01-25 LAB — CMP (CANCER CENTER ONLY)
ALT: 12 U/L (ref 0–44)
AST: 16 U/L (ref 15–41)
Albumin: 4.2 g/dL (ref 3.5–5.0)
Alkaline Phosphatase: 52 U/L (ref 38–126)
Anion gap: 5 (ref 5–15)
BUN: 16 mg/dL (ref 6–20)
CO2: 29 mmol/L (ref 22–32)
Calcium: 9.4 mg/dL (ref 8.9–10.3)
Chloride: 102 mmol/L (ref 98–111)
Creatinine: 0.55 mg/dL (ref 0.44–1.00)
GFR, Estimated: >60 mL/min (ref >60)
Glucose, Bld: 110 mg/dL — ABNORMAL HIGH (ref 70–99)
Potassium: 3.7 mmol/L (ref 3.5–5.1)
Sodium: 136 mmol/L (ref 135–145)
Total Bilirubin: 0.5 mg/dL (ref 0.3–1.2)
Total Protein: 7.2 g/dL (ref 6.5–8.1)

## 2022-01-25 MED ORDER — MIRTAZAPINE 7.5 MG PO TABS: 7.5000 mg | ORAL_TABLET | Freq: Every day | ORAL | Status: DC

## 2022-01-25 NOTE — Progress Notes (Signed)
Office Visit Note  Patient: Joanne Jackson             Date of Birth: 08/03/1975           MRN: [id number]             PCP: Leamon Arnt, MD Referring: Leamon Arnt, MD Visit Date: 02/08/2022 Occupation: '@GUAROCC'$ @  Subjective:  Dry mouth and dry eyes  History of Present Illness: Joanne Jackson is a 46 y.o. female with history of Sjogren's disease.  She returns today after her last visit in July 2023.  She continues to have dry mouth and dry eyes which are manageable with over-the-counter products.  She states the paresthesias in her hands have improved.  She is not experiencing any joint pain or discomfort.  She denies any history of joint swelling.  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    Activities of Daily Living:  Patient reports morning stiffness for 0 minutes.   Patient Denies nocturnal pain.  Difficulty dressing/grooming: Denies Difficulty climbing stairs: Denies Difficulty getting out of chair: Denies Difficulty using hands for taps, buttons, cutlery, and/or writing: Denies  Review of Systems  Constitutional:  Positive for fatigue.  HENT:  Positive for mouth dryness. Negative for mouth sores.   Eyes:  Positive for dryness.  Respiratory:  Negative for shortness of breath.   Cardiovascular:  Negative for chest pain and palpitations.  Gastrointestinal:  Positive for constipation. Negative for blood in stool and diarrhea.  Endocrine: Negative for increased urination.  Genitourinary:  Negative for involuntary urination.  Musculoskeletal:  Negative for joint pain, gait problem, joint pain, joint swelling, myalgias, muscle weakness, morning stiffness, muscle tenderness and myalgias.  Skin:  Negative for color change, rash and sensitivity to sunlight.  Allergic/Immunologic: Negative for susceptible to infections.  Neurological:  Negative for dizziness and headaches.  Hematological:  Negative for swollen glands.  Psychiatric/Behavioral:  Negative for  depressed mood and sleep disturbance. The patient is not nervous/anxious.     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[place]) 04/30/2021   Episode of transient neurologic symptoms 04/28/2021   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[place]) 10/25/2020   History of gestational diabetes 10/25/2020   Iron deficiency anemia 12/03/2019   Tubular adenoma of colon 10/24/2018   Essential hypertension 12/26/2017   Prediabetes 12/26/2017   Intramural leiomyoma of uterus 07/29/2015    Past Medical History:  Diagnosis Date   Brain cancer ([place])    Glioma ([place]) 05/18/2021   Hypertension    Pre-diabetes    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[place])     Family History  Problem Relation Age of Onset   Thyroid disease Mother    Heart attack Father 1   Hypertension Father    Healthy Brother    Healthy Paternal Grandfather    Healthy Daughter    Past Surgical History:  Procedure Laterality Date   BRAIN SURGERY  05/2021   tumor removal   CESAREAN SECTION     DILATION AND CURETTAGE, DIAGNOSTIC / THERAPEUTIC     Social History   Social History Narrative   Not on file   Immunization History  Administered Date(s) Administered   Influenza,inj,Quad PF,6+ Mos 11/11/2015, 10/24/2018, 12/02/2019, 10/25/2020, 11/14/2021   Moderna Sars-Covid-2 Vaccination 05/29/2019, 06/19/2019   PFIZER(Purple Top)SARS-COV-2 Vaccination 01/19/2020  Tdap 10/14/2015     Objective: Vital Signs: BP 116/80 (BP Location: Left Arm, Patient Position: Sitting, Cuff Size: Normal)   Pulse 66   Resp 15   Ht '4\' 11"'$  (1.499 m)   Wt 151 lb 9.6 oz (68.8 kg)   BMI 30.62 kg/m    Physical Exam   Musculoskeletal Exam: Cervical thoracic and lumbar spine were in good range of motion.  Shoulder joints, elbow joints,  wrist joints, MCPs PIPs and DIPs with good range of motion with no synovitis.  Hip joints, knee joints, ankles, MTPs and PIPs with good range of motion with no synovitis.  CDAI Exam: CDAI Score: -- Patient Global: --; Provider Global: -- Swollen: --; Tender: -- Joint Exam 02/08/2022   No joint exam has been documented for this visit   There is currently no information documented on the homunculus. Go to the Rheumatology activity and complete the homunculus joint exam.  Investigation: No additional findings.  Imaging: No results found.  Recent Labs: Lab Results  Component Value Date   WBC 4.8 01/25/2022   HGB 13.2 01/25/2022   PLT 171 01/25/2022   NA 136 01/25/2022   K 3.7 01/25/2022   CL 102 01/25/2022   CO2 29 01/25/2022   GLUCOSE 110 (H) 01/25/2022   BUN 16 01/25/2022   CREATININE 0.55 01/25/2022   BILITOT 0.5 01/25/2022   ALKPHOS 52 01/25/2022   AST 16 01/25/2022   ALT 12 01/25/2022   PROT 7.2 01/25/2022   ALBUMIN 4.2 01/25/2022   CALCIUM 9.4 01/25/2022   GFRAA 138 11/24/2019    Speciality Comments: No specialty comments available.  Procedures:  No procedures performed Allergies: Patient has no known allergies.   Assessment / Plan:     Visit Diagnoses: 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[facility]) - ANA 1:160 NS, RF<14, sed rate 26, Ro+: -She continues to have dry mouth and dry eye symptoms which are manageable with over-the-counter products.  Over-the-counter products were discussed at length.  Increased risk of ILD with the Sjogren's and increased risk of lymphoma with Sjogren's was also discussed.  She had no lymphadenopathy no parotid swelling.  She denies any history of oral ulcers, nasal ulcers, malar rash, Raynaud's or inflammatory arthritis.  I will check autoimmune labs today.  She had recent CBC and CMP which were normal.  Plan: Anti-DNA antibody, double-stranded, C3 and C4, Sedimentation rate, Sjogrens syndrome-A extractable nuclear antibody,  ANA  Paresthesia of both hands-she has noticed improvement in the paresthesias.  Chronic pain of both knees -she denies discomfort in her knee joints today.  X-rays of both knee joints were unremarkable on 11/25/18.  Other fatigue-she continues to have fatigue.  She has been also diagnosed with obstructive sleep apnea.  Obstructive sleep apnea-she is followed by Dr. Annamaria Boots.  Vitamin D deficiency -she has history vitamin D deficiency.  She is taking vitamin D over-the-counter.  Plan: VITAMIN D 25 Hydroxy (Vit-D Deficiency, Fractures)  Prediabetes  Essential hypertension-blood pressure is Ameluz 116/80.  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  She had surgery at [facility] followed by chemotherapy and radiation therapy.  She is still followed at [facility].    Orders: Orders Placed This Encounter  Procedures   Anti-DNA antibody, double-stranded   C3 and C4   Sedimentation rate   Sjogrens syndrome-A extractable nuclear antibody   ANA   VITAMIN D 25 Hydroxy (Vit-D Deficiency, Fractures)   No orders of the defined types were placed in this encounter.  Follow-Up Instructions: Return in about 5 months (around 07/10/2022) for Sjogren's.   Bo Merino, MD  Note - This record has been created using Editor, commissioning.  Chart creation errors have been sought, but may not always  have been located. Such creation errors do not reflect on  the standard of medical care.

## 2022-01-25 NOTE — Progress Notes (Signed)
[facility] [place], [place] [address] [phone]   Interval Evaluation  Date of Service: 01/25/22 Patient Name: Joanne Jackson Patient MRN: [id number] Patient DOB: 1975-02-17 Provider: Ventura Sellers, MD  Identifying Statement:  Joanne Jackson is a 45 y.o. female with right frontal  high grade glioma, IDH mt    Oncologic History: Oncology History  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[place])  05/18/2021 Surgery   Right frontal craniotomy, resection with Dr. Tommi Rumps at [facility]; path demonstrates High Grade Glioma IDH-101m.   06/19/2021 - 06/19/2021 Chemotherapy   Patient is on Treatment Plan : BRAIN GLIOBLASTOMA Radiation Therapy With Concurrent Temozolomide 75 mg/m2 Daily Followed By Sequential Maintenance Temozolomide x 6-12 cycles     08/28/2021 -  Chemotherapy   Patient is on Treatment Plan : BRAIN GLIOBLASTOMA Consolidation Temozolomide Days 1-5 q28 Days        Biomarkers:  MGMT Unknown.  IDH 1/2 Mutated.  EGFR Unknown  TERT Unmutated   Interval History: [name] today for follow up, now having completed cycle #5 adjuvant Temodar. No new issues this cycle.  Sleep is better on melatonin and trazodone. She has returned her CPAP mask due to discomfort.  [facility] MRI.  H+P (06/12/21) Patient presented to medical attention in March 2023 with new onset seizures, characterized by shaking of the left hand without impaired awareness.  CNS imaging demonstrated non enhancing mass within right frontal lobe, c/w primary neoplasm.  She underwent craniotomy, [facility]18[facility] was High Grade Glioma IDHmt.  Since surgery, husband has noticed more blunted (depressed?) mood, less responsive and engaging.  Otherwise she is fully independent and functional.  [facility]  Medications: Current Outpatient Medications on File Prior to Visit  Medication Sig Dispense Refill   Cholecalciferol (VITAMIN D3) 50 MCG (2000 UT) TABS Take 2,000 Units by mouth daily as needed.      docusate sodium (COLACE) 100 MG capsule Take 1 capsule (100 mg total) by mouth 2 (two) times daily. 10 capsule 0   ferrous sulfate 324 (65 Fe) MG TBEC TAKE 1 TABLET BY MOUTH EVERY DAY (Patient taking differently: Take 324 mg by mouth daily.) 90 tablet 1   levETIRAcetam (KEPPRA) 500 MG tablet TAKE 1 TABLET BY MOUTH TWICE A DAY 180 tablet 3   lisinopril (ZESTRIL) 10 MG tablet Take 1 tablet (10 mg total) by mouth daily. 90 tablet 3   melatonin 3 MG TABS tablet Take 3 mg by mouth at bedtime.     omeprazole (PRILOSEC) 20 MG capsule Take 20 mg by mouth daily.     ondansetron (ZOFRAN) 8 MG tablet TAKE 1 TABLET BY MOUTH 2 TIMES DAILY AS NEEDED FOR NAUSEA AND VOMITING. MAY TAKE 30-60 MINUTES PRIOR TO TEMODAR ADMINISTRATION IF NAUSEA/VOMITING OCCURS. 30 tablet 1   RESTASIS 0.05 % ophthalmic emulsion Place 1 drop into both eyes 2 (two) times daily.     traZODone (DESYREL) 100 MG tablet Take 1 tablet (100 mg total) by mouth at bedtime. 30 tablet 2   No current facility-administered medications on file prior to visit.    Allergies: No Known Allergies Past Medical History:  Past Medical History:  Diagnosis Date   [place]    [place]   Hypertension    Pre-diabetes    Sjogren's syndrome  with keratoconjunctivitis sic[facility])    Past Surgical History:  Past Surgical History:  Procedure Laterality Date   BRAIN SURGERY  05/2021   tumor removal   CESAREAN SECTION     DILATION AND CURETTAGE, DIAGNOSTIC / THERAPEUTIC     Social History:  Social History   Socioeconomic History   Marital status: Married    Spouse name: Not on file   Number of children: 1   Years of education: Not on file   Highest education level: Not on file  Occupational History    Occupation: Accounting  Tobacco Use   Smoking status: Never    Passive exposure: Never   Smokeless tobacco: Never  Vaping Use   Vaping Use: Never used  Substance and Sexual Activity   Alcohol use: Not Currently   Drug use: Never   Sexual activity: Yes    Partners: Male    Birth control/protection: Other-see comments, Condom    Comment: husband is infertile  Other Topics Concern   Not on file  Social History Narrative   Not on file   Social Determinants of Health   Financial Resource Strain: Not on file  Food Insecurity: Not on file  Transportation Needs: Not on file  Physical Activity: Not on file  Stress: Not on file  Social Connections: Not on file  Intimate Partner Violence: Not on file   Family History:  Family History  Problem Relation Age of Onset   Thyroid disease Mother    Heart attack Father 51   Hypertension Father    Healthy Brother    Healthy Paternal Grandfather    Healthy Daughter     Review of Systems: Constitutional: Doesn't report fevers, chills or abnormal weight loss Eyes: Doesn't report blurriness of vision Ears, nose, mouth, throat, and face: Doesn't report sore throat Respiratory: Doesn't report cough, dyspnea or wheezes Cardiovascular: Doesn't report palpitation, chest discomfort  Gastrointestinal:  Doesn't report nausea, constipation, diarrhea GU: Doesn't report incontinence Skin: Doesn't report skin rashes Neurological: Per HPI Musculoskeletal: Doesn't report joint pain Behavioral/Psych: Doesn't report anxiety  Physical Exam: Vitals:   01/25/22 1146  Pulse: 65  Resp: 15  Temp: 98 F (36.7 C)  SpO2: 100%     KPS: 90. General: Alert, cooperative, pleasant, in no acute distress Head: Normal EENT: No conjunctival injection or scleral icterus.  Lungs: Resp effort normal Cardiac: Regular rate Abdomen: Non-distended abdomen Skin: No rashes cyanosis or petechiae. Extremities: No clubbing or edema  Neurologic Exam: Mental  Status: Awake, alert, attentive to examiner. Oriented to self and environment. Language is fluent with intact comprehension. Abulic features noted. Cranial Nerves: Visual acuity is grossly normal. Visual fields are full. Extra-ocular movements intact. No ptosis. Face is symmetric Motor: Tone and bulk are normal. Power is full in both arms and legs. Reflexes are symmetric, no pathologic reflexes present.  Sensory: Intact to light touch Gait: Normal.   Labs: I have reviewed the data as listed    Component Value Date/Time   NA 137 12/14/2021 1117   K 3.8 12/14/2021 1117   CL 104 12/14/2021 1117   CO2 27 12/14/2021 1117   GLUCOSE 94 12/14/2021 1117   BUN 13 12/14/2021 1117   CREATININE 0.58 12/14/2021 1117   CREATININE 0.50 11/24/2019 1449   CALCIUM 9.3 12/14/2021 1117   PROT 7.0 12/14/2021 1117   ALBUMIN 4.2 12/14/2021 1117   AST 15 12/14/2021 1117   ALT 14 12/14/2021 1117   ALKPHOS 55 12/14/2021 1117   BILITOT 0.5 12/14/2021  [place] 12/14/2021 1117   GFRNONAA 119 11/24/2019 1449   GFRAA 138 11/24/2019 1449   Lab Results  Component Value Date   WBC 4.8 01/25/2022   NEUTROABS 4.0 01/25/2022   HGB 13.2 01/25/2022   HCT 36.9 01/25/2022   MCV 86.6 01/25/2022   PLT 171 01/25/2022    Imaging:    Assessment/Plan 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HCC)  Rumaysa Kelty is clinically stable today, now having cycle #5 adjuvant Temodar.  MRI is not available for review yet, pending electronic share from [facility].  Team there recommended continuing Temodar.  We recommended continuing treatment with cycle #6 Temozolomide 150 mg/m2, on for five days and off for twenty three days in twenty eight day cycles. The patient will have a complete blood count performed on days 21 and 28 of each cycle, and a comprehensive metabolic panel performed on day 28 of each cycle. Labs may need to be performed more often. Zofran will prescribed for home use for nausea/vomiting.    Chemotherapy should be held for the following:  ANC less than 1,000  Platelets less than 100,000  LFT or creatinine greater than 2x ULN  If clinical concerns/contraindications develop  May con't trazodone to 186m HS, also on Lunesta 230m    Keppra will con't 50044mID.  We appreciate the opportunity to participate in the care of Tayia Bruneau.   We ask that GanYarrow Linhartturn to clinic in 1 months with labs for evaluation prior to cycle #7 with labs for evaluation, or sooner as needed.  All questions were answered. The patient knows to call the clinic with any problems, questions or concerns. No barriers to learning were detected.  The total time spent in the encounter was 30 minutes and more than 50% was on counseling and review of test results   ZacVentura SellersD Medical Director of Neuro-Oncology [facility] [place]/21/23 11:56 AM

## 2022-02-08 ENCOUNTER — Encounter: Payer: Self-pay | Admitting: Rheumatology

## 2022-02-08 ENCOUNTER — Ambulatory Visit: Payer: 59 | Attending: Rheumatology | Admitting: Rheumatology

## 2022-02-08 VITALS — BP 116/80 | HR 66 | Resp 15 | Ht 59.0 in | Wt 151.6 lb

## 2022-02-08 DIAGNOSIS — R7303 Prediabetes: Secondary | ICD-10-CM

## 2022-02-08 DIAGNOSIS — R5383 Other fatigue: Secondary | ICD-10-CM | POA: Diagnosis not present

## 2022-02-08 DIAGNOSIS — C719 Malignant neoplasm of brain, unspecified: Secondary | ICD-10-CM

## 2022-02-08 DIAGNOSIS — R202 Paresthesia of skin: Secondary | ICD-10-CM

## 2022-02-08 DIAGNOSIS — M3501 Sicca syndrome with keratoconjunctivitis: Secondary | ICD-10-CM | POA: Diagnosis not present

## 2022-02-08 DIAGNOSIS — G8929 Other chronic pain: Secondary | ICD-10-CM

## 2022-02-08 DIAGNOSIS — D126 Benign neoplasm of colon, unspecified: Secondary | ICD-10-CM

## 2022-02-08 DIAGNOSIS — M25561 Pain in right knee: Secondary | ICD-10-CM

## 2022-02-08 DIAGNOSIS — M25562 Pain in left knee: Secondary | ICD-10-CM

## 2022-02-08 DIAGNOSIS — I10 Essential (primary) hypertension: Secondary | ICD-10-CM

## 2022-02-08 DIAGNOSIS — G4733 Obstructive sleep apnea (adult) (pediatric): Secondary | ICD-10-CM

## 2022-02-08 DIAGNOSIS — E559 Vitamin D deficiency, unspecified: Secondary | ICD-10-CM

## 2022-02-09 ENCOUNTER — Other Ambulatory Visit: Payer: Self-pay

## 2022-02-11 LAB — C3 AND C4
C3 Complement: 135 mg/dL (ref 83–193)
C4 Complement: 24 mg/dL (ref 15–57)

## 2022-02-11 LAB — SJOGRENS SYNDROME-A EXTRACTABLE NUCLEAR ANTIBODY: SSA (Ro) (ENA) Antibody, IgG: 7.8 AI — AB

## 2022-02-11 LAB — ANTI-DNA ANTIBODY, DOUBLE-STRANDED: ds DNA Ab: <1 IU/mL

## 2022-02-11 LAB — VITAMIN D 25 HYDROXY (VIT D DEFICIENCY, FRACTURES): Vit D, 25-Hydroxy: 51 ng/mL (ref 30–100)

## 2022-02-11 LAB — ANTI-NUCLEAR AB-TITER (ANA TITER): ANA Titer 1: 1:80 {titer} — ABNORMAL HIGH

## 2022-02-11 LAB — SEDIMENTATION RATE: Sed Rate: 25 mm/h — ABNORMAL HIGH (ref 0–20)

## 2022-02-11 LAB — ANA: Anti Nuclear Antibody (ANA): POSITIVE — AB

## 2022-02-12 NOTE — Progress Notes (Signed)
Sed rate is elevated at 25, SSA antibody is positive as before.  ANA is low titer positive.  Double-stranded DNA is negative.  Complements are normal and vitamin D is normal.  No change in treatment advised.

## 2022-02-13 ENCOUNTER — Other Ambulatory Visit: Payer: Self-pay | Admitting: Internal Medicine

## 2022-02-13 DIAGNOSIS — C719 Malignant neoplasm of brain, unspecified: Secondary | ICD-10-CM

## 2022-02-15 ENCOUNTER — Encounter: Payer: Self-pay | Admitting: Internal Medicine

## 2022-02-21 ENCOUNTER — Ambulatory Visit: Payer: 59 | Admitting: Family Medicine

## 2022-02-22 ENCOUNTER — Inpatient Hospital Stay: Payer: 59 | Attending: Radiation Oncology

## 2022-02-22 ENCOUNTER — Inpatient Hospital Stay (HOSPITAL_BASED_OUTPATIENT_CLINIC_OR_DEPARTMENT_OTHER): Payer: 59 | Admitting: Internal Medicine

## 2022-02-22 VITALS — BP 127/90 | HR 62 | Temp 97.5°F | Resp 20 | Wt 150.9 lb

## 2022-02-22 DIAGNOSIS — C711 Malignant neoplasm of frontal lobe: Secondary | ICD-10-CM | POA: Diagnosis present

## 2022-02-22 DIAGNOSIS — M3500 Sicca syndrome, unspecified: Secondary | ICD-10-CM | POA: Diagnosis not present

## 2022-02-22 DIAGNOSIS — C719 Malignant neoplasm of brain, unspecified: Secondary | ICD-10-CM | POA: Diagnosis not present

## 2022-02-22 DIAGNOSIS — Z79899 Other long term (current) drug therapy: Secondary | ICD-10-CM | POA: Insufficient documentation

## 2022-02-22 DIAGNOSIS — R1110 Vomiting, unspecified: Secondary | ICD-10-CM | POA: Diagnosis not present

## 2022-02-22 DIAGNOSIS — Z8349 Family history of other endocrine, nutritional and metabolic diseases: Secondary | ICD-10-CM | POA: Insufficient documentation

## 2022-02-22 DIAGNOSIS — Z79630 Long term (current) use of alkylating agent: Secondary | ICD-10-CM | POA: Insufficient documentation

## 2022-02-22 DIAGNOSIS — Z8249 Family history of ischemic heart disease and other diseases of the circulatory system: Secondary | ICD-10-CM | POA: Insufficient documentation

## 2022-02-22 DIAGNOSIS — I10 Essential (primary) hypertension: Secondary | ICD-10-CM | POA: Insufficient documentation

## 2022-02-22 DIAGNOSIS — R55 Syncope and collapse: Secondary | ICD-10-CM | POA: Insufficient documentation

## 2022-02-22 LAB — CBC WITH DIFFERENTIAL (CANCER CENTER ONLY)
Abs Immature Granulocytes: 0.01 10*3/uL (ref 0.00–0.07)
Basophils Absolute: 0 10*3/uL (ref 0.0–0.1)
Basophils Relative: 0 %
Eosinophils Absolute: 0.1 10*3/uL (ref 0.0–0.5)
Eosinophils Relative: 1 %
HCT: 38 % (ref 36.0–46.0)
Hemoglobin: 13.3 g/dL (ref 12.0–15.0)
Immature Granulocytes: 0 %
Lymphocytes Relative: 11 %
Lymphs Abs: 0.5 10*3/uL — ABNORMAL LOW (ref 0.7–4.0)
MCH: 31.3 pg (ref 26.0–34.0)
MCHC: 35 g/dL (ref 30.0–36.0)
MCV: 89.4 fL (ref 80.0–100.0)
Monocytes Absolute: 0.3 10*3/uL (ref 0.1–1.0)
Monocytes Relative: 7 %
Neutro Abs: 3.5 10*3/uL (ref 1.7–7.7)
Neutrophils Relative %: 81 %
Platelet Count: 186 10*3/uL (ref 150–400)
RBC: 4.25 MIL/uL (ref 3.87–5.11)
RDW: 12.4 % (ref 11.5–15.5)
WBC Count: 4.3 10*3/uL (ref 4.0–10.5)
nRBC: 0 % (ref 0.0–0.2)

## 2022-02-22 LAB — CMP (CANCER CENTER ONLY)
ALT: 11 U/L (ref 0–44)
AST: 12 U/L — ABNORMAL LOW (ref 15–41)
Albumin: 4.4 g/dL (ref 3.5–5.0)
Alkaline Phosphatase: 51 U/L (ref 38–126)
Anion gap: 4 — ABNORMAL LOW (ref 5–15)
BUN: 14 mg/dL (ref 6–20)
CO2: 29 mmol/L (ref 22–32)
Calcium: 9.6 mg/dL (ref 8.9–10.3)
Chloride: 103 mmol/L (ref 98–111)
Creatinine: 0.54 mg/dL (ref 0.44–1.00)
GFR, Estimated: >60 mL/min (ref >60)
Glucose, Bld: 88 mg/dL (ref 70–99)
Potassium: 3.9 mmol/L (ref 3.5–5.1)
Sodium: 136 mmol/L (ref 135–145)
Total Bilirubin: 0.6 mg/dL (ref 0.3–1.2)
Total Protein: 7.2 g/dL (ref 6.5–8.1)

## 2022-02-22 NOTE — Progress Notes (Signed)
[facility] [place], [place] [address] [phone]   Interval Evaluation  Date of Service: 02/22/22 Patient Name: Joanne Jackson Patient MRN: [id number] Patient DOB: 18-Mar-1975 Provider: Ventura Sellers, MD  Identifying Statement:  Joanne Jackson is a 47 y.o. female with right frontal  high grade glioma, IDH mt    Oncologic History: Oncology History  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[place])  05/18/2021 Surgery   Right frontal craniotomy, resection with Dr. Tommi Rumps at [facility]; path demonstrates High Grade Glioma IDH-23m.   06/19/2021 - 06/19/2021 Chemotherapy   Patient is on Treatment Plan : BRAIN GLIOBLASTOMA Radiation Therapy With Concurrent Temozolomide 75 mg/m2 Daily Followed By Sequential Maintenance Temozolomide x 6-12 cycles     08/28/2021 -  Chemotherapy   Patient is on Treatment Plan : BRAIN GLIOBLASTOMA Consolidation Temozolomide Days 1-5 q28 Days        Biomarkers:  MGMT Unknown.  IDH 1/2 Mutated.  EGFR Unknown  TERT Unmutated   Interval History: Joanne Jackson today for follow up, now having completed cycle #6 adjuvant Temodar. No new issues this cycle.  She had an episode of near syncope followed by transient left hand clumsiness this past week.  Continues on Keppra '500mg'$  twice per day.  Sleep is better on melatonin and trazodone. She has returned her CPAP mask due to discomfort.    H+P (06/12/21) Patient presented to medical attention in March 2023 with new onset seizures, characterized by shaking of the left hand without impaired awareness.  CNS imaging demonstrated non enhancing mass within right frontal lobe, c/w primary neoplasm.  She underwent craniotomy, [facility] 05[facility]18[facility]21 [facility] was High Grade Glioma IDHmt.  Since surgery, husband has noticed more blunted (depressed?) mood, less responsive and engaging.  Otherwise she is fully independent and functional.   [facility]  Medications: Current Outpatient Medications on File Prior to Visit  Medication Sig Dispense Refill   Cholecalciferol (VITAMIN D3) 50 MCG (2000 UT) TABS Take 2,000 Units by mouth daily as needed.      docusate sodium (COLACE) 100 MG capsule Take 1 capsule (100 mg total) by mouth 2 (two) times daily. (Patient taking differently: Take 100 mg by mouth as needed.) 10 capsule 0   ferrous sulfate 324 (65 Fe) MG TBEC TAKE 1 TABLET BY MOUTH EVERY DAY (Patient taking differently: Take 324 mg by mouth daily.) 90 tablet 1   levETIRAcetam (KEPPRA) 500 MG tablet TAKE 1 TABLET BY MOUTH TWICE A DAY 180 tablet 3   lisinopril (ZESTRIL) 10 MG tablet Take 1 tablet (10 mg total) by mouth daily. 90 tablet 3   melatonin 3 MG TABS tablet Take 3 mg by mouth at bedtime.     mirtazapine (REMERON) 7.5 MG tablet Take 1 tablet (7.5 mg total) by mouth at bedtime.     omeprazole (PRILOSEC) 20 MG capsule Take 20 mg by mouth daily.     ondansetron (ZOFRAN) 8 MG tablet TAKE 1 TABLET BY MOUTH 2 TIMES DAILY AS NEEDED FOR NAUSEA AND VOMITING. MAY TAKE 30-60 MINUTES PRIOR TO TEMODAR ADMINISTRATION IF NAUSEA/VOMITING OCCURS. 30 tablet 1   RESTASIS 0.05 % ophthalmic emulsion Place 1 drop into both eyes 2 (two) times daily.     temozolomide (TEMODAR) 140 MG capsule TAKE 2 CAPSULES BY MOUTH ONCE DAILY ON DAYS 1 TO 5 OF 28 DAY CYCLE. TAKE ON EMPTY  STOMACH. TOTAL DAILY DOSE IS '280MG'$  10 capsule 0   traZODone (DESYREL) 100 MG tablet Take 1 tablet (100 mg total) by mouth at bedtime. 30 tablet 2   No current facility-administered medications on file prior to visit.    Allergies: No Known Allergies Past Medical History:  Past Medical History:  Diagnosis Date   Brain cancer ([place])    Glioma ([place]) 05/18/2021   Hypertension    Pre-diabetes    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HCC)    Past Surgical History:  Past Surgical History:   Procedure Laterality Date   BRAIN SURGERY  05/2021   tumor removal   CESAREAN SECTION     DILATION AND CURETTAGE, DIAGNOSTIC / THERAPEUTIC     Social History:  Social History   Socioeconomic History   Marital status: Married    Spouse name: Not on file   Number of children: 1   Years of education: Not on file   Highest education level: Not on file  Occupational History   Occupation: Accounting  Tobacco Use   Smoking status: Never    Passive exposure: Never   Smokeless tobacco: Never  Vaping Use   Vaping Use: Never used  Substance and Sexual Activity   Alcohol use: Not Currently   Drug use: Never   Sexual activity: Yes    Partners: Male    Birth control/protection: Other-see comments, Condom    Comment: husband is infertile  Other Topics Concern   Not on file  Social History Narrative   Not on file   Social Determinants of Health   Financial Resource Strain: Not on file  Food Insecurity: Not on file  Transportation Needs: Not on file  Physical Activity: Not on file  Stress: Not on file  Social Connections: Not on file  Intimate Partner Violence: Not on file   Family History:  Family History  Problem Relation Age of Onset   Thyroid disease Mother    Heart attack Father 67   Hypertension Father    Healthy Brother    Healthy Paternal Grandfather    Healthy Daughter     Review of Systems: Constitutional: Doesn't report fevers, chills or abnormal weight loss Eyes: Doesn't report blurriness of vision Ears, nose, mouth, throat, and face: Doesn't report sore throat Respiratory: Doesn't report cough, dyspnea or wheezes Cardiovascular: Doesn't report palpitation, chest discomfort  Gastrointestinal:  Doesn't report nausea, constipation, diarrhea GU: Doesn't report incontinence Skin: Doesn't report skin rashes Neurological: Per HPI Musculoskeletal: Doesn't report joint pain Behavioral/Psych: Doesn't report anxiety  Physical Exam: Vitals:   02/22/22 1143   BP: (!) 127/90  Pulse: 62  Resp: 20  Temp: (!) 97.5 F (36.4 C)  SpO2: 100%    KPS: 90. General: Alert, cooperative, pleasant, in no acute distress Head: Normal EENT: No conjunctival injection or scleral icterus.  Lungs: Resp effort normal Cardiac: Regular rate Abdomen: Non-distended abdomen Skin: No rashes cyanosis or petechiae. Extremities: No clubbing or edema  Neurologic Exam: Mental Status: Awake, alert, attentive to examiner. Oriented to self and environment. Language is fluent with intact comprehension. Abulic features noted. Cranial Nerves: Visual acuity is grossly normal. Visual fields are full. Extra-ocular movements intact. No ptosis. Face is symmetric Motor: Tone and bulk are normal. Power is full in both arms and legs. Reflexes are symmetric, no pathologic reflexes present.  Sensory: Intact to light touch Gait: Normal.   Labs: I have reviewed the data as listed    Component Value Date/Time   NA 136 01/25/2022  1135   K 3.7 01/25/2022 1135   CL 102 01/25/2022 1135   CO2 29 01/25/2022 1135   GLUCOSE 110 (H) 01/25/2022 1135   BUN 16 01/25/2022 1135   CREATININE 0.55 01/25/2022 1135   CREATININE 0.50 11/24/2019 1449   CALCIUM 9.4 01/25/2022 1135   PROT 7.2 01/25/2022 1135   ALBUMIN 4.2 01/25/2022 1135   AST 16 01/25/2022 1135   ALT 12 01/25/2022 1135   ALKPHOS 52 01/25/2022 1135   BILITOT 0.5 01/25/2022 1135   GFRNONAA >60 01/25/2022 1135   GFRNONAA 119 11/24/2019 1449   GFRAA 138 11/24/2019 1449   Lab Results  Component Value Date   WBC 4.3 02/22/2022   NEUTROABS 3.5 02/22/2022   HGB 13.3 02/22/2022   HCT 38.0 02/22/2022   MCV 89.4 02/22/2022   PLT 186 02/22/2022    Imaging:    Assessment/Plan 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HCC)  Joanne Jackson is clinically stable today, now having cycle #6 adjuvant Temodar.  No new or progressive changes.  We recommended continuing treatment with cycle #7 Temozolomide 150 mg/m2, on for  five days and off for twenty three days in twenty eight day cycles. The patient will have a complete blood count performed on days 21 and 28 of each cycle, and a comprehensive metabolic panel performed on day 28 of each cycle. Labs may need to be performed more often. Zofran will prescribed for home use for nausea/vomiting.   Cycle #7 will begin 03/12/22  Chemotherapy should be held for the following:  ANC less than 1,000  Platelets less than 100,000  LFT or creatinine greater than 2x ULN  If clinical concerns/contraindications develop  May con't trazodone to '100mg'$  HS, also on Lunesta '2mg'$ .    Keppra will con't '500mg'$  BID.  We appreciate the opportunity to participate in the care of Joanne Jackson.   We ask that Joanne Jackson return to clinic in 1 months with MRI brain for evaluation prior to cycle #8, or sooner as needed.  All questions were answered. The patient knows to call the clinic with any problems, questions or concerns. No barriers to learning were detected.  The total time spent in the encounter was 30 minutes and more than 50% was on counseling and review of test results   Ventura Sellers, MD Medical Director of Neuro-Oncology [facility] [place] 02/22/22 11:51 AM

## 2022-03-02 ENCOUNTER — Encounter: Payer: Self-pay | Admitting: Family Medicine

## 2022-03-14 ENCOUNTER — Other Ambulatory Visit: Payer: Self-pay | Admitting: Internal Medicine

## 2022-03-14 DIAGNOSIS — C719 Malignant neoplasm of brain, unspecified: Secondary | ICD-10-CM

## 2022-03-15 NOTE — Telephone Encounter (Signed)
Patient will have Labs and MD visit prior to refilling TMZ.

## 2022-03-20 ENCOUNTER — Encounter: Payer: Self-pay | Admitting: Internal Medicine

## 2022-03-29 ENCOUNTER — Telehealth: Payer: Self-pay | Admitting: Internal Medicine

## 2022-03-29 ENCOUNTER — Inpatient Hospital Stay: Payer: 59 | Attending: Radiation Oncology | Admitting: Internal Medicine

## 2022-03-29 ENCOUNTER — Encounter: Payer: Self-pay | Admitting: Internal Medicine

## 2022-03-29 ENCOUNTER — Inpatient Hospital Stay: Payer: 59

## 2022-03-29 ENCOUNTER — Ambulatory Visit
Admission: RE | Admit: 2022-03-29 | Discharge: 2022-03-29 | Disposition: A | Discharge status: Home / Self Care | Payer: Self-pay | Source: Ambulatory Visit | Attending: Internal Medicine | Admitting: Internal Medicine

## 2022-03-29 VITALS — BP 125/84 | HR 65 | Temp 98.1°F | Resp 20 | Wt 153.2 lb

## 2022-03-29 DIAGNOSIS — C711 Malignant neoplasm of frontal lobe: Secondary | ICD-10-CM | POA: Insufficient documentation

## 2022-03-29 DIAGNOSIS — Z79899 Other long term (current) drug therapy: Secondary | ICD-10-CM | POA: Diagnosis not present

## 2022-03-29 DIAGNOSIS — I10 Essential (primary) hypertension: Secondary | ICD-10-CM | POA: Diagnosis not present

## 2022-03-29 DIAGNOSIS — Z79630 Long term (current) use of alkylating agent: Secondary | ICD-10-CM | POA: Insufficient documentation

## 2022-03-29 DIAGNOSIS — Z8349 Family history of other endocrine, nutritional and metabolic diseases: Secondary | ICD-10-CM | POA: Insufficient documentation

## 2022-03-29 DIAGNOSIS — Z8249 Family history of ischemic heart disease and other diseases of the circulatory system: Secondary | ICD-10-CM | POA: Insufficient documentation

## 2022-03-29 DIAGNOSIS — C719 Malignant neoplasm of brain, unspecified: Secondary | ICD-10-CM

## 2022-03-29 DIAGNOSIS — M3500 Sicca syndrome, unspecified: Secondary | ICD-10-CM | POA: Diagnosis not present

## 2022-03-29 DIAGNOSIS — R112 Nausea with vomiting, unspecified: Secondary | ICD-10-CM | POA: Insufficient documentation

## 2022-03-29 DIAGNOSIS — R251 Tremor, unspecified: Secondary | ICD-10-CM | POA: Insufficient documentation

## 2022-03-29 LAB — CBC WITH DIFFERENTIAL (CANCER CENTER ONLY)
Abs Immature Granulocytes: 0.02 10*3/uL (ref 0.00–0.07)
Basophils Absolute: 0 10*3/uL (ref 0.0–0.1)
Basophils Relative: 0 %
Eosinophils Absolute: 0.1 10*3/uL (ref 0.0–0.5)
Eosinophils Relative: 1 %
HCT: 37.1 % (ref 36.0–46.0)
Hemoglobin: 13.3 g/dL (ref 12.0–15.0)
Immature Granulocytes: 1 %
Lymphocytes Relative: 15 %
Lymphs Abs: 0.5 10*3/uL — ABNORMAL LOW (ref 0.7–4.0)
MCH: 31.5 pg (ref 26.0–34.0)
MCHC: 35.8 g/dL (ref 30.0–36.0)
MCV: 87.9 fL (ref 80.0–100.0)
Monocytes Absolute: 0.4 10*3/uL (ref 0.1–1.0)
Monocytes Relative: 10 %
Neutro Abs: 2.5 10*3/uL (ref 1.7–7.7)
Neutrophils Relative %: 73 %
Platelet Count: 189 10*3/uL (ref 150–400)
RBC: 4.22 MIL/uL (ref 3.87–5.11)
RDW: 12.1 % (ref 11.5–15.5)
WBC Count: 3.5 10*3/uL — ABNORMAL LOW (ref 4.0–10.5)
nRBC: 0 % (ref 0.0–0.2)

## 2022-03-29 LAB — CMP (CANCER CENTER ONLY)
ALT: 13 U/L (ref 0–44)
AST: 14 U/L — ABNORMAL LOW (ref 15–41)
Albumin: 4.4 g/dL (ref 3.5–5.0)
Alkaline Phosphatase: 59 U/L (ref 38–126)
Anion gap: 4 — ABNORMAL LOW (ref 5–15)
BUN: 17 mg/dL (ref 6–20)
CO2: 30 mmol/L (ref 22–32)
Calcium: 9.3 mg/dL (ref 8.9–10.3)
Chloride: 103 mmol/L (ref 98–111)
Creatinine: 0.52 mg/dL (ref 0.44–1.00)
GFR, Estimated: >60 mL/min (ref >60)
Glucose, Bld: 99 mg/dL (ref 70–99)
Potassium: 4.1 mmol/L (ref 3.5–5.1)
Sodium: 137 mmol/L (ref 135–145)
Total Bilirubin: 0.6 mg/dL (ref 0.3–1.2)
Total Protein: 7.3 g/dL (ref 6.5–8.1)

## 2022-03-29 MED ORDER — TEMOZOLOMIDE 140 MG PO CAPS: 150.0000 mg/m2/d | ORAL_CAPSULE | Freq: Every day | ORAL | 0 refills | Status: DC

## 2022-03-29 MED ORDER — ONDANSETRON HCL 8 MG PO TABS: ORAL_TABLET | ORAL | 1 refills | Status: DC

## 2022-03-29 NOTE — Progress Notes (Signed)
[place] at [place], [place] [address] [phone]   Interval Evaluation  Date of Service: 03/29/22 Patient Name: Joanne Jackson Patient MRN: [id number] Patient DOB: [birth year] Provider: Ventura Sellers, MD  Identifying Statement:  Yalexa Handelman is a 47 y.o. female with right frontal  high grade glioma, IDH mt    Oncologic History: Oncology History  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[place])  05/18/2021 Surgery   Right frontal craniotomy, resection with Dr. Tommi Rumps at [facility]; path demonstrates High Grade Glioma IDH-30m.   06/19/2021 - 06/19/2021 Chemotherapy   Patient is on Treatment Plan : BRAIN GLIOBLASTOMA Radiation Therapy With Concurrent Temozolomide 75 mg/m2 Daily Followed By Sequential Maintenance Temozolomide x 6-12 cycles     08/28/2021 -  Chemotherapy   Patient is on Treatment Plan : BRAIN GLIOBLASTOMA Consolidation Temozolomide Days 1-5 q28 Days        Biomarkers:  MGMT Unknown.  IDH 1/2 Mutated.  EGFR Unknown  TERT Unmutated   Interval History: Joanne Foustpresents today for follow up, now having completed cycle #7 adjuvant Temodar. No new issues this cycle.  No further episodes of near passing out like last month. Continues on Keppra 5053mtwice per day.  Sleep is better on melatonin, remeron, and trazodone now reduced to 509mt night. She has returned her CPAP mask due to discomfort.    H+P (06/12/21) Patient presented to medical attention in March 2023 with new onset seizures, characterized by shaking of the left hand without impaired awareness.  CNS imaging demonstrated non enhancing mass within right frontal lobe, c/w primary neoplasm.  She underwent craniotomy, resection on 05/18/21 [facility].  Since surgery, husband has noticed more blunted (depressed?) mood, less responsive and engaging.  Otherwise she is fully independent and functional.   [facility]  Medications: Current Outpatient Medications on File Prior to Visit  Medication Sig Dispense Refill   Cholecalciferol (VITAMIN D3) 50 MCG (2000 UT) TABS Take 2,000 Units by mouth daily as needed.      docusate sodium (COLACE) 100 MG capsule Take 1 capsule (100 mg total) by mouth 2 (two) times daily. (Patient taking differently: Take 100 mg by mouth as needed.) 10 capsule 0   ferrous sulfate 324 (65 Fe) MG TBEC TAKE 1 TABLET BY MOUTH EVERY DAY (Patient taking differently: Take 324 mg by mouth daily.) 90 tablet 1   levETIRAcetam (KEPPRA) 500 MG tablet TAKE 1 TABLET BY MOUTH TWICE A DAY 180 tablet 3   lisinopril (ZESTRIL) 10 MG tablet Take 1 tablet (10 mg total) by mouth daily. 90 tablet 3   melatonin 3 MG TABS tablet Take 3 mg by mouth at bedtime.     mirtazapine (REMERON) 7.5 MG tablet Take 1 tablet (7.5 mg total) by mouth at bedtime.     omeprazole (PRILOSEC) 20 MG capsule Take 20 mg by mouth daily.     ondansetron (ZOFRAN) 8 MG tablet TAKE 1 TABLET BY MOUTH 2 TIMES DAILY AS NEEDED FOR NAUSEA AND VOMITING. MAY TAKE 30-60 MINUTES PRIOR TO TEMODAR ADMINISTRATION IF NAUSEA/VOMITING OCCURS. 30 tablet 1   RESTASIS 0.05 % ophthalmic emulsion Place 1 drop into both eyes 2 (two) times daily.     traZODone (DESYREL) 100 MG tablet Take 1 tablet (100 mg total) by mouth at bedtime. 30 tablet 2   No current facility-administered medications  on file prior to visit.    Allergies: No Known Allergies Past Medical History:  Past Medical History:  Diagnosis Date   Brain cancer ([place])    Glioma ([place]) 05/18/2021   Hypertension    Pre-diabetes    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HCC)    Past Surgical History:  Past Surgical History:  Procedure Laterality Date   BRAIN SURGERY  05/2021   tumor removal   CESAREAN SECTION     DILATION AND CURETTAGE, DIAGNOSTIC / THERAPEUTIC     Social History:  Social  History   Socioeconomic History   Marital status: Married    Spouse name: Not on file   Number of children: 1   Years of education: Not on file   Highest education level: Not on file  Occupational History   Occupation: Accounting  Tobacco Use   Smoking status: Never    Passive exposure: Never   Smokeless tobacco: Never  Vaping Use   Vaping Use: Never used  Substance and Sexual Activity   Alcohol use: Not Currently   Drug use: Never   Sexual activity: Yes    Partners: Male    Birth control/protection: Other-see comments, Condom    Comment: husband is infertile  Other Topics Concern   Not on file  Social History Narrative   Not on file   Social Determinants of Health   Financial Resource Strain: Not on file  Food Insecurity: Not on file  Transportation Needs: Not on file  Physical Activity: Not on file  Stress: Not on file  Social Connections: Not on file  Intimate Partner Violence: Not on file   Family History:  Family History  Problem Relation Age of Onset   Thyroid disease Mother    Heart attack Father 30   Hypertension Father    Healthy Brother    Healthy Paternal Grandfather    Healthy Daughter     Review of Systems: Constitutional: Doesn't report fevers, chills or abnormal weight loss Eyes: Doesn't report blurriness of vision Ears, nose, mouth, throat, and face: Doesn't report sore throat Respiratory: Doesn't report cough, dyspnea or wheezes Cardiovascular: Doesn't report palpitation, chest discomfort  Gastrointestinal:  Doesn't report nausea, constipation, diarrhea GU: Doesn't report incontinence Skin: Doesn't report skin rashes Neurological: Per HPI Musculoskeletal: Doesn't report joint pain Behavioral/Psych: Doesn't report anxiety  Physical Exam: Vitals:   03/29/22 1015  BP: 125/84  Pulse: 65  Resp: 20  Temp: 98.1 F (36.7 C)  SpO2: 100%    KPS: 90. General: Alert, cooperative, pleasant, in no acute distress Head: Normal EENT: No  conjunctival injection or scleral icterus.  Lungs: Resp effort normal Cardiac: Regular rate Abdomen: Non-distended abdomen Skin: No rashes cyanosis or petechiae. Extremities: No clubbing or edema  Neurologic Exam: Mental Status: Awake, alert, attentive to examiner. Oriented to self and environment. Language is fluent with intact comprehension. Abulic features noted. Cranial Nerves: Visual acuity is grossly normal. Visual fields are full. Extra-ocular movements intact. No ptosis. Face is symmetric Motor: Tone and bulk are normal. Power is full in both arms and legs. Reflexes are symmetric, no pathologic reflexes present.  Sensory: Intact to light touch Gait: Normal.   Labs: I have reviewed the data as listed    Component Value Date/Time   NA 137 03/29/2022 1000   K 4.1 03/29/2022 1000   CL 103 03/29/2022 1000   CO2 30 03/29/2022 1000   GLUCOSE 99 03/29/2022 1000   BUN 17 03/29/2022 1000   CREATININE 0.52 03/29/2022 1000  CREATININE 0.50 11/24/2019 1449   CALCIUM 9.3 03/29/2022 1000   PROT 7.3 03/29/2022 1000   ALBUMIN 4.4 03/29/2022 1000   AST 14 (L) 03/29/2022 1000   ALT 13 03/29/2022 1000   ALKPHOS 59 03/29/2022 1000   BILITOT 0.6 03/29/2022 1000   GFRNONAA >60 03/29/2022 1000   GFRNONAA 119 11/24/2019 1449   GFRAA 138 11/24/2019 1449   Lab Results  Component Value Date   WBC 3.5 (L) 03/29/2022   NEUTROABS 2.5 03/29/2022   HGB 13.3 03/29/2022   HCT 37.1 03/29/2022   MCV 87.9 03/29/2022   PLT 189 03/29/2022    Imaging:    Assessment/Plan 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[place]) - Plan: MR OUTSIDE FILMS HEAD/FACE  Joanne Jackson is clinically stable today, now having cycle #7 adjuvant Temodar.  MRI brain at [address] [facility] demonstrated stable findings.  We recommended continuing treatment with cycle #8 Temozolomide 150 mg/m2, on for five days and off for twenty three days in twenty eight day cycles. The patient will have a complete blood count performed on  days 21 and 28 of each cycle, and a comprehensive metabolic panel performed on day 28 of each cycle. Labs may need to be performed more often. Zofran will prescribed for home use for nausea/vomiting.   Cycle #8 will begin 04/09/22.  We will see if we can obtain this [facility] patient [facility].  Chemotherapy should be held for the following:  ANC less than 1,000  Platelets less than 100,000  LFT or creatinine greater than 2x ULN  If clinical concerns/contraindications develop  May con't trazodone to 10m HS, also on Lunesta 281m    Keppra will con't 50063mID.  We appreciate the opportunity to participate in the care of Joanne Jackson.   We ask that GanKristee Holnessturn to clinic in 1 months with labs for evaluation prior to cycle #9, or sooner as needed.  All questions were answered. The patient knows to call the clinic with any problems, questions or concerns. No barriers to learning were detected.  The total time spent in the encounter was 30 minutes and more than 50% was on counseling and review of test results   ZacVentura SellersD Medical Director of Neuro-Oncology [facility] [place]/22/24 10:37 AM

## 2022-03-29 NOTE — Addendum Note (Signed)
Addended by: Ventura Sellers on: 03/29/2022 12:20 PM   Modules accepted: Orders

## 2022-04-13 ENCOUNTER — Encounter: Payer: Self-pay | Admitting: Internal Medicine

## 2022-04-13 DIAGNOSIS — C719 Malignant neoplasm of brain, unspecified: Secondary | ICD-10-CM

## 2022-04-13 MED ORDER — TEMOZOLOMIDE 140 MG PO CAPS: 150.0000 mg/m2/d | ORAL_CAPSULE | Freq: Every day | ORAL | 0 refills | Status: DC

## 2022-04-17 ENCOUNTER — Encounter: Payer: Self-pay | Admitting: Internal Medicine

## 2022-04-23 ENCOUNTER — Encounter: Payer: Self-pay | Admitting: Obstetrics & Gynecology

## 2022-04-24 ENCOUNTER — Inpatient Hospital Stay (HOSPITAL_BASED_OUTPATIENT_CLINIC_OR_DEPARTMENT_OTHER): Payer: 59 | Admitting: Internal Medicine

## 2022-04-24 ENCOUNTER — Encounter: Payer: Self-pay | Admitting: Obstetrics & Gynecology

## 2022-04-24 ENCOUNTER — Other Ambulatory Visit: Payer: 59

## 2022-04-24 ENCOUNTER — Ambulatory Visit: Payer: 59 | Admitting: Internal Medicine

## 2022-04-24 ENCOUNTER — Inpatient Hospital Stay: Payer: 59 | Attending: Radiation Oncology

## 2022-04-24 ENCOUNTER — Ambulatory Visit (INDEPENDENT_AMBULATORY_CARE_PROVIDER_SITE_OTHER): Payer: 59 | Admitting: Obstetrics & Gynecology

## 2022-04-24 VITALS — BP 133/86 | HR 60 | Temp 97.9°F | Resp 16 | Wt 155.2 lb

## 2022-04-24 VITALS — BP 108/74 | HR 70

## 2022-04-24 DIAGNOSIS — Z79899 Other long term (current) drug therapy: Secondary | ICD-10-CM | POA: Diagnosis not present

## 2022-04-24 DIAGNOSIS — R112 Nausea with vomiting, unspecified: Secondary | ICD-10-CM | POA: Diagnosis not present

## 2022-04-24 DIAGNOSIS — Z8349 Family history of other endocrine, nutritional and metabolic diseases: Secondary | ICD-10-CM | POA: Insufficient documentation

## 2022-04-24 DIAGNOSIS — Z79630 Long term (current) use of alkylating agent: Secondary | ICD-10-CM | POA: Insufficient documentation

## 2022-04-24 DIAGNOSIS — I10 Essential (primary) hypertension: Secondary | ICD-10-CM | POA: Diagnosis not present

## 2022-04-24 DIAGNOSIS — C719 Malignant neoplasm of brain, unspecified: Secondary | ICD-10-CM

## 2022-04-24 DIAGNOSIS — Z8249 Family history of ischemic heart disease and other diseases of the circulatory system: Secondary | ICD-10-CM | POA: Diagnosis not present

## 2022-04-24 DIAGNOSIS — R251 Tremor, unspecified: Secondary | ICD-10-CM | POA: Insufficient documentation

## 2022-04-24 DIAGNOSIS — N926 Irregular menstruation, unspecified: Secondary | ICD-10-CM | POA: Diagnosis not present

## 2022-04-24 DIAGNOSIS — C711 Malignant neoplasm of frontal lobe: Secondary | ICD-10-CM | POA: Insufficient documentation

## 2022-04-24 DIAGNOSIS — N914 Secondary oligomenorrhea: Secondary | ICD-10-CM

## 2022-04-24 DIAGNOSIS — N898 Other specified noninflammatory disorders of vagina: Secondary | ICD-10-CM

## 2022-04-24 DIAGNOSIS — M3500 Sicca syndrome, unspecified: Secondary | ICD-10-CM | POA: Diagnosis not present

## 2022-04-24 DIAGNOSIS — N760 Acute vaginitis: Secondary | ICD-10-CM

## 2022-04-24 LAB — CBC WITH DIFFERENTIAL (CANCER CENTER ONLY)
Abs Immature Granulocytes: 0.01 10*3/uL (ref 0.00–0.07)
Basophils Absolute: 0 10*3/uL (ref 0.0–0.1)
Basophils Relative: 1 %
Eosinophils Absolute: 0 10*3/uL (ref 0.0–0.5)
Eosinophils Relative: 1 %
HCT: 39.3 % (ref 36.0–46.0)
Hemoglobin: 13.8 g/dL (ref 12.0–15.0)
Immature Granulocytes: 0 %
Lymphocytes Relative: 15 %
Lymphs Abs: 0.6 10*3/uL — ABNORMAL LOW (ref 0.7–4.0)
MCH: 31 pg (ref 26.0–34.0)
MCHC: 35.1 g/dL (ref 30.0–36.0)
MCV: 88.3 fL (ref 80.0–100.0)
Monocytes Absolute: 0.4 10*3/uL (ref 0.1–1.0)
Monocytes Relative: 10 %
Neutro Abs: 2.7 10*3/uL (ref 1.7–7.7)
Neutrophils Relative %: 73 %
Platelet Count: 184 10*3/uL (ref 150–400)
RBC: 4.45 MIL/uL (ref 3.87–5.11)
RDW: 12.2 % (ref 11.5–15.5)
WBC Count: 3.7 10*3/uL — ABNORMAL LOW (ref 4.0–10.5)
nRBC: 0 % (ref 0.0–0.2)

## 2022-04-24 LAB — CMP (CANCER CENTER ONLY)
ALT: 11 U/L (ref 0–44)
AST: 13 U/L — ABNORMAL LOW (ref 15–41)
Albumin: 4.4 g/dL (ref 3.5–5.0)
Alkaline Phosphatase: 56 U/L (ref 38–126)
Anion gap: 5 (ref 5–15)
BUN: 13 mg/dL (ref 6–20)
CO2: 28 mmol/L (ref 22–32)
Calcium: 9.3 mg/dL (ref 8.9–10.3)
Chloride: 103 mmol/L (ref 98–111)
Creatinine: 0.5 mg/dL (ref 0.44–1.00)
GFR, Estimated: >60 mL/min (ref >60)
Glucose, Bld: 94 mg/dL (ref 70–99)
Potassium: 3.8 mmol/L (ref 3.5–5.1)
Sodium: 136 mmol/L (ref 135–145)
Total Bilirubin: 0.7 mg/dL (ref 0.3–1.2)
Total Protein: 7.1 g/dL (ref 6.5–8.1)

## 2022-04-24 LAB — PREGNANCY, URINE: Preg Test, Ur: NEGATIVE

## 2022-04-24 LAB — WET PREP FOR TRICH, YEAST, CLUE

## 2022-04-24 MED ORDER — TINIDAZOLE 500 MG PO TABS: 1000.0000 mg | ORAL_TABLET | Freq: Two times a day (BID) | ORAL | 0 refills | Status: AC

## 2022-04-24 MED ORDER — TEMOZOLOMIDE 140 MG PO CAPS: 150.0000 mg/m2/d | ORAL_CAPSULE | Freq: Every day | ORAL | 0 refills | Status: DC

## 2022-04-24 NOTE — Progress Notes (Signed)
[place] at [place], [place] [address] [phone]   Interval Evaluation  Date of Service: 04/24/22 Patient Name: Joanne Jackson Patient MRN: [id number] Patient DOB: 1975-12-18 Provider: Ventura Sellers, MD  Identifying Statement:  Joanne Jackson is a 47 y.o. female with right frontal  high grade glioma, IDH mt    Oncologic History: Oncology History  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[place])  05/18/2021 Surgery   Right frontal craniotomy, resection with Dr. Tommi Rumps at [facility]; path demonstrates High Grade Glioma IDH-87mt.   06/19/2021 - 06/19/2021 Chemotherapy   Patient is on Treatment Plan : BRAIN GLIOBLASTOMA Radiation Therapy With Concurrent Temozolomide 75 mg/m2 Daily Followed By Sequential Maintenance Temozolomide x 6-12 cycles     08/28/2021 -  Chemotherapy   Patient is on Treatment Plan : BRAIN GLIOBLASTOMA Consolidation Temozolomide Days 1-5 q28 Days        Biomarkers:  MGMT Unknown.  IDH 1/2 Mutated.  EGFR Unknown  TERT Unmutated   Interval History: Joanne Jackson presents today for follow up, now having completed cycle #8 adjuvant Temodar.  Denies new or progressive changes.  Continues on Keppra 500mg  twice per day.  Sleep is better on melatonin, remeron, and trazodone now reduced to 50mg  at night.  Dosed Temodar on 04/09/22.  H+P (06/12/21) Patient presented to medical attention in March 2023 with new onset seizures, characterized by shaking of the left hand without impaired awareness.  CNS imaging demonstrated non enhancing mass within right frontal lobe, c/w primary neoplasm.  She underwent craniotomy, resection on 05/18/21 with Dr. Tommi Rumps at [facility]; pathology was High Grade Glioma IDHmt.  Since surgery, husband has noticed more blunted (depressed?) mood, less responsive and engaging.  Otherwise she is fully independent and functional.  Referred to [facility] from [facility] for local implementation of radiation and  chemotherapy.  Medications: Current Outpatient Medications on File Prior to Visit  Medication Sig Dispense Refill   Cholecalciferol (VITAMIN D3) 50 MCG (2000 UT) TABS Take 2,000 Units by mouth daily as needed.      docusate sodium (COLACE) 100 MG capsule Take 1 capsule (100 mg total) by mouth 2 (two) times daily. (Patient taking differently: Take 100 mg by mouth as needed.) 10 capsule 0   ferrous sulfate 324 (65 Fe) MG TBEC TAKE 1 TABLET BY MOUTH EVERY DAY (Patient taking differently: Take 324 mg by mouth daily.) 90 tablet 1   levETIRAcetam (KEPPRA) 500 MG tablet TAKE 1 TABLET BY MOUTH TWICE A DAY 180 tablet 3   lisinopril (ZESTRIL) 10 MG tablet Take 1 tablet (10 mg total) by mouth daily. 90 tablet 3   melatonin 3 MG TABS tablet Take 3 mg by mouth at bedtime.     mirtazapine (REMERON) 7.5 MG tablet Take 1 tablet (7.5 mg total) by mouth at bedtime.     omeprazole (PRILOSEC) 20 MG capsule Take 20 mg by mouth daily.     ondansetron (ZOFRAN) 8 MG tablet TAKE 1 TABLET BY MOUTH 2 TIMES DAILY AS NEEDED FOR NAUSEA AND VOMITING. MAY TAKE 30-60 MINUTES PRIOR TO TEMODAR ADMINISTRATION IF NAUSEA/VOMITING OCCURS. 60 tablet 1   RESTASIS 0.05 % ophthalmic emulsion Place 1 drop into both eyes 2 (two) times daily.     temozolomide (TEMODAR) 140 MG capsule Take 2 capsules (280 mg total) by mouth at bedtime. 10 capsule 0   temozolomide (TEMODAR) 140 MG capsule Take 2 capsules (280 mg total) by mouth at bedtime. 10 capsule 0  traZODone (DESYREL) 100 MG tablet Take 1 tablet (100 mg total) by mouth at bedtime. 30 tablet 2   No current facility-administered medications on file prior to visit.    Allergies: No Known Allergies Past Medical History:  Past Medical History:  Diagnosis Date   Brain cancer ([place])    Glioma ([place]) 05/18/2021   Hypertension    Pre-diabetes    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HCC)    Past Surgical History:  Past Surgical History:  Procedure Laterality Date    BRAIN SURGERY  05/2021   tumor removal   CESAREAN SECTION     DILATION AND CURETTAGE, DIAGNOSTIC / THERAPEUTIC     Social History:  Social History   Socioeconomic History   Marital status: Married    Spouse name: Not on file   Number of children: 1   Years of education: Not on file   Highest education level: Not on file  Occupational History   Occupation: Accounting  Tobacco Use   Smoking status: Never    Passive exposure: Never   Smokeless tobacco: Never  Vaping Use   Vaping Use: Never used  Substance and Sexual Activity   Alcohol use: Not Currently   Drug use: Never   Sexual activity: Yes    Partners: Male    Birth control/protection: Other-see comments, Condom    Comment: husband is infertile  Other Topics Concern   Not on file  Social History Narrative   Not on file   Social Determinants of Health   Financial Resource Strain: Not on file  Food Insecurity: Not on file  Transportation Needs: Not on file  Physical Activity: Not on file  Stress: Not on file  Social Connections: Not on file  Intimate Partner Violence: Not on file   Family History:  Family History  Problem Relation Age of Onset   Thyroid disease Mother    Heart attack Father 40   Hypertension Father    Healthy Brother    Healthy Paternal Grandfather    Healthy Daughter     Review of Systems: Constitutional: Doesn't report fevers, chills or abnormal weight loss Eyes: Doesn't report blurriness of vision Ears, nose, mouth, throat, and face: Doesn't report sore throat Respiratory: Doesn't report cough, dyspnea or wheezes Cardiovascular: Doesn't report palpitation, chest discomfort  Gastrointestinal:  Doesn't report nausea, constipation, diarrhea GU: Doesn't report incontinence Skin: Doesn't report skin rashes Neurological: Per HPI Musculoskeletal: Doesn't report joint pain Behavioral/Psych: Doesn't report anxiety  Physical Exam: Vitals:   04/24/22 1049  BP: 133/86  Pulse: 60  Resp:  16  Temp: 97.9 F (36.6 C)  SpO2: 99%    KPS: 90. General: Alert, cooperative, pleasant, in no acute distress Head: Normal EENT: No conjunctival injection or scleral icterus.  Lungs: Resp effort normal Cardiac: Regular rate Abdomen: Non-distended abdomen Skin: No rashes cyanosis or petechiae. Extremities: No clubbing or edema  Neurologic Exam: Mental Status: Awake, alert, attentive to examiner. Oriented to self and environment. Language is fluent with intact comprehension. Abulic features noted. Cranial Nerves: Visual acuity is grossly normal. Visual fields are full. Extra-ocular movements intact. No ptosis. Face is symmetric Motor: Tone and bulk are normal. Power is full in both arms and legs. Reflexes are symmetric, no pathologic reflexes present.  Sensory: Intact to light touch Gait: Normal.   Labs: I have reviewed the data as listed    Component Value Date/Time   NA 137 03/29/2022 1000   K 4.1 03/29/2022 1000   CL 103 03/29/2022 1000  CO2 30 03/29/2022 1000   GLUCOSE 99 03/29/2022 1000   BUN 17 03/29/2022 1000   CREATININE 0.52 03/29/2022 1000   CREATININE 0.50 11/24/2019 1449   CALCIUM 9.3 03/29/2022 1000   PROT 7.3 03/29/2022 1000   ALBUMIN 4.4 03/29/2022 1000   AST 14 (L) 03/29/2022 1000   ALT 13 03/29/2022 1000   ALKPHOS 59 03/29/2022 1000   BILITOT 0.6 03/29/2022 1000   GFRNONAA >60 03/29/2022 1000   GFRNONAA 119 11/24/2019 1449   GFRAA 138 11/24/2019 1449   Lab Results  Component Value Date   WBC 3.7 (L) 04/24/2022   NEUTROABS 2.7 04/24/2022   HGB 13.8 04/24/2022   HCT 39.3 04/24/2022   MCV 88.3 04/24/2022   PLT 184 04/24/2022    Imaging:    Assessment/Plan 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HCC)  Joanne Jackson is clinically stable today, now having cycle #8 adjuvant Temodar.  No new or progressive changes.  We recommended continuing treatment with cycle #9 Temozolomide 150 mg/m2, on for five days and off for twenty three  days in twenty eight day cycles. The patient will have a complete blood count performed on days 21 and 28 of each cycle, and a comprehensive metabolic panel performed on day 28 of each cycle. Labs may need to be performed more often. Zofran will prescribed for home use for nausea/vomiting.   Cycle #9 will begin 05/08/22.  We will see if we can obtain [facility] per patient preference.  Chemotherapy should be held for the following:  ANC less than 1,000  Platelets less than 100,000  LFT or creatinine greater than 2x ULN  If clinical concerns/contraindications develop  May con't trazodone to 50mg  HS, also on Lunesta 2mg .    Keppra will con't 500mg  BID.  We appreciate the opportunity to participate in the care of Joanne Jackson.   We ask that Joanne Jackson return to clinic in 1 months with labs for evaluation prior to cycle #9, or sooner as needed.  All questions were answered. The patient knows to call the clinic with any problems, questions or concerns. No barriers to learning were detected.  The total time spent in the encounter was 30 minutes and more than 50% was on counseling and review of test results   Ventura Sellers, MD Medical Director of Neuro-Oncology [facility] [place] 04/24/22 11:03 AM

## 2022-04-24 NOTE — Progress Notes (Signed)
    Joanne Jackson 30-Oct-1975 [id number]        47 y.o.  G1P1001   RP: Irregular menstrual cycles  HPI: Irregular cycle, Oligomenorrhea.  LMP with normal flow 02/03/2022.  No BTB. No pelvic pain.  No hot flushes or night sweats.  Using condoms and husband has a h/o Infertility.  Pt also complains of vaginal & vulvar itching & burning.  No UTI Sx.  BMs normal.   OB History  Gravida Para Term Preterm AB Living  1 1 1     1   SAB IAB Ectopic Multiple Live Births               # Outcome Date GA Lbr Len/2nd Weight Sex Delivery Anes PTL Lv  1 Term             Past medical history,surgical history, problem list, medications, allergies, family history and social history were all reviewed and documented in the EPIC chart.   Directed ROS with pertinent positives and negatives documented in the history of present illness/assessment and plan.  Exam:  Vitals:   04/24/22 1614  BP: 108/74  Pulse: 70  SpO2: 99%   General appearance:  Normal  Abdomen: Normal  Gynecologic exam: Vulva with mild discharge.  Speculum:  Cervix/vagina normal.  Wet prep done.  Bimanual exam: Uterus AV, normal volume, NT, mobile.  No adnexal mass, NT.  Wet prep:  Clue cells present with odor UPT Neg   Assessment/Plan:  47 y.o. G1P1001   1. Irregular menstrual cycle Irregular cycle, Oligomenorrhea.  LMP with normal flow 02/03/2022.  No BTB. No pelvic pain.  No hot flushes or night sweats.  Using condoms and husband has a h/o Infertility.  Pt also complains of vaginal & vulvar itching & burning.  No UTI Sx.  BMs normal.  Normal gyn exam except for BV.  UPT Neg.  Pending TSH/FSH/Prolactin.  F/U Pelvic US to assess the endometrial line. - Pregnancy, urine - TSH - FSH - Prolactin - US Transvaginal Non-OB; Future  2. Secondary oligomenorrhea As above. - TSH - FSH - Prolactin - US Transvaginal Non-OB; Future  3. Vagina itching Bacterial vaginosis confirmed by wet prep.  Will treat with Tinidazole.  Usage  reviewed, prescription sent to pharmacy. - WET PREP FOR Bee, YEAST, CLUE  Other orders - traZODone (DESYREL) 50 MG tablet; Take 50 mg by mouth at bedtime.  - tinidazole (TINDAMAX) 500 MG tablet; Take 2 tablets (1,000 mg total) by mouth 2 (two) times daily for 2 days.   Princess Bruins MD, 4:16 PM 04/24/2022

## 2022-04-25 LAB — TSH: TSH: 0.92 mIU/L

## 2022-04-25 LAB — PROLACTIN: Prolactin: 8.3 ng/mL

## 2022-04-25 LAB — FOLLICLE STIMULATING HORMONE: FSH: 7.9 m[IU]/mL

## 2022-04-26 ENCOUNTER — Ambulatory Visit: Payer: 59 | Admitting: Obstetrics & Gynecology

## 2022-05-07 ENCOUNTER — Telehealth: Payer: Self-pay | Admitting: Internal Medicine

## 2022-05-07 NOTE — Telephone Encounter (Signed)
Called patient regarding upcoming April appointments, left a voicemail. 

## 2022-05-14 ENCOUNTER — Other Ambulatory Visit (HOSPITAL_COMMUNITY): Payer: Self-pay

## 2022-05-16 ENCOUNTER — Encounter: Payer: Self-pay | Admitting: Family Medicine

## 2022-05-16 ENCOUNTER — Ambulatory Visit (INDEPENDENT_AMBULATORY_CARE_PROVIDER_SITE_OTHER): Payer: 59 | Admitting: Family Medicine

## 2022-05-16 VITALS — BP 122/80 | HR 64 | Temp 98.1°F | Ht 59.0 in | Wt 154.0 lb

## 2022-05-16 DIAGNOSIS — G4709 Other insomnia: Secondary | ICD-10-CM

## 2022-05-16 DIAGNOSIS — R7303 Prediabetes: Secondary | ICD-10-CM

## 2022-05-16 DIAGNOSIS — C719 Malignant neoplasm of brain, unspecified: Secondary | ICD-10-CM

## 2022-05-16 DIAGNOSIS — I10 Essential (primary) hypertension: Secondary | ICD-10-CM

## 2022-05-16 NOTE — Patient Instructions (Signed)
Please return in 3-6 months for your annual complete physical; please come fasting.   I am glad you are doing well.  If you have any questions or concerns, please don't hesitate to send me a message via MyChart or call the office at [phone]. Thank you for visiting with [place] today! It's our pleasure caring for you.

## 2022-05-16 NOTE — Progress Notes (Signed)
Subjective  CC:  Chief Complaint  Patient presents with   Follow-up    For Bp recheck and blood sugars pt has no other concerns     HPI: Joanne Jackson is a 47 y.o. female who presents to the office today to address the problems listed above in the chief complaint. Hypertension f/u: Control is good . Pt reports she is doing well. taking medications as instructed, no medication side effects noted, no TIAs, no chest pain on exertion, no dyspnea on exertion, no swelling of ankles. On lisinopril 10 daily. She denies adverse effects from his BP medications. Compliance with medication is good.  Sleep: added mirtazepine 7.5mg  nighlty to trazodone 50 nightly and doing better. Seeing Dr. Tomasa Hose, psych who helps specialize in sleep medicine ([facility]).  Ongoing brain cancer treatments; she reports she is doing well.  Prediabetes w/o sxs of hyperglycemia; weekly cmp with avg bs < 100 by chart review.  Lab Results  Component Value Date   HGBA1C 6.2 08/21/2021   HGBA1C 5.9 (A) 02/09/2021   HGBA1C 5.8 (A) 10/25/2020       Assessment  1. Essential hypertension   2. Prediabetes   3. Secondary insomnia   4. 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 Continue lisinopril 10 Insomnia now controlled on mirtazapine and trazodone Prediabetes: no sxs of hyperglycemia and stable by onc labs. Glioma per onc  Education regarding management of these chronic disease states was given. Management strategies discussed on successive visits include dietary and exercise recommendations, goals of achieving and maintaining IBW, and lifestyle modifications aiming for adequate sleep and minimizing stressors.   Follow up: 3-6 mo for cpe  No orders of the defined types were placed in this encounter.  No orders of the defined types were placed in this encounter.     BP Readings from Last 3 Encounters:  05/16/22 122/80  04/24/22 108/74  04/24/22  133/86   Wt Readings from Last 3 Encounters:  05/16/22 154 lb (69.9 kg)  04/24/22 155 lb 3.2 oz (70.4 kg)  03/29/22 153 lb 3.2 oz (69.5 kg)    Lab Results  Component Value Date   CHOL 173 08/21/2021   CHOL 167 10/25/2020   CHOL 158 12/02/2019   Lab Results  Component Value Date   HDL 45.50 08/21/2021   HDL 47.70 10/25/2020   HDL 50 12/02/2019   Lab Results  Component Value Date   LDLCALC 98 08/21/2021   LDLCALC 82 10/25/2020   LDLCALC 85 12/02/2019   Lab Results  Component Value Date   TRIG 149.0 08/21/2021   TRIG 187.0 (H) 10/25/2020   TRIG 131 12/02/2019   Lab Results  Component Value Date   CHOLHDL 4 08/21/2021   CHOLHDL 3 10/25/2020   CHOLHDL 3.2 12/02/2019   Lab Results  Component Value Date   LDLDIRECT 86.0 05/21/2019   Lab Results  Component Value Date   CREATININE 0.50 04/24/2022   BUN 13 04/24/2022   NA 136 04/24/2022   K 3.8 04/24/2022   CL 103 04/24/2022   CO2 28 04/24/2022    The 10-year ASCVD risk score (Arnett DK, et al., 2019) is: 1.1%   Values used to calculate the score:     Age: 6 years     Sex: Female     Is Non-Hispanic African American: No     Diabetic: No     Tobacco smoker: No  Systolic Blood Pressure: 122 mmHg     Is BP treated: Yes     HDL Cholesterol: 45.5 mg/dL     Total Cholesterol: 173 mg/dL  I reviewed the patients updated PMH, FH, and SocHx.    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 04/30/2021    Priority: High   Sjogren's syndrome with keratoconjunctivitis sicca 10/25/2020    Priority: High   Tubular adenoma of colon 10/24/2018    Priority: High   Essential hypertension 12/26/2017    Priority: High   Moderate obstructive sleep apnea 10/31/2021    Priority: Medium    Iron deficiency anemia 12/03/2019    Priority: Medium    Prediabetes 12/26/2017    Priority: Medium    History of  gestational diabetes 10/25/2020    Priority: Low   Intramural leiomyoma of uterus 07/29/2015    Priority: Low   Dyslipidemia 04/30/2021   Episode of transient neurologic symptoms 04/28/2021    Allergies: Patient has no known allergies.  Social History: Patient  reports that she has never smoked. She has never been exposed to tobacco smoke. She has never used smokeless tobacco. She reports that she does not currently use alcohol. She reports that she does not use drugs.  Current Meds  Medication Sig   Cholecalciferol (VITAMIN D3) 50 MCG (2000 UT) TABS Take 2,000 Units by mouth daily as needed.    docusate sodium (COLACE) 100 MG capsule Take 1 capsule (100 mg total) by mouth 2 (two) times daily. (Patient taking differently: Take 100 mg by mouth as needed.)   ferrous sulfate 324 (65 Fe) MG TBEC TAKE 1 TABLET BY MOUTH EVERY DAY (Patient taking differently: Take 324 mg by mouth daily.)   levETIRAcetam (KEPPRA) 500 MG tablet TAKE 1 TABLET BY MOUTH TWICE A DAY   lisinopril (ZESTRIL) 10 MG tablet Take 1 tablet (10 mg total) by mouth daily.   melatonin 3 MG TABS tablet Take 3 mg by mouth at bedtime.   mirtazapine (REMERON) 7.5 MG tablet Take 1 tablet (7.5 mg total) by mouth at bedtime.   omeprazole (PRILOSEC) 20 MG capsule Take 20 mg by mouth daily.   ondansetron (ZOFRAN) 8 MG tablet TAKE 1 TABLET BY MOUTH 2 TIMES DAILY AS NEEDED FOR NAUSEA AND VOMITING. MAY TAKE 30-60 MINUTES PRIOR TO TEMODAR ADMINISTRATION IF NAUSEA/VOMITING OCCURS.   polyethylene glycol powder (GLYCOLAX/MIRALAX) 17 GM/SCOOP powder Take by mouth.   RESTASIS 0.05 % ophthalmic emulsion Place 1 drop into both eyes 2 (two) times daily.   temozolomide (TEMODAR) 140 MG capsule Take 2 capsules (280 mg total) by mouth at bedtime.   traZODone (DESYREL) 50 MG tablet Take 50 mg by mouth at bedtime.    Review of Systems: Cardiovascular: negative for chest pain, palpitations, leg swelling, orthopnea Respiratory: negative for SOB,  wheezing or persistent cough Gastrointestinal: negative for abdominal pain Genitourinary: negative for dysuria or gross hematuria  Objective  Vitals: BP 122/80 (BP Location: Left Arm, Patient Position: Sitting, Cuff Size: Normal)   Pulse 64   Temp 98.1 F (36.7 C) (Oral)   Ht 4\' 11"  (1.499 m)   Wt 154 lb (69.9 kg)   LMP 05/09/2022 (Exact Date) Comment: condoms, husband infertile, sexually active  SpO2 98%   BMI 31.10 kg/m  General: no acute distress  Psych:  Alert and oriented, flat mood and affect HEENT:  Normocephalic, atraumatic, supple  neck  Cardiovascular:  RRR without murmur.  Respiratory:  Good breath sounds bilaterally, CTAB with normal respiratory effort  Commons side effects, risks, benefits, and alternatives for medications and treatment plan prescribed today were discussed, and the patient expressed understanding of the given instructions. Patient is instructed to call or message via MyChart if he/she has any questions or concerns regarding our treatment plan. No barriers to understanding were identified. We discussed Red Flag symptoms and signs in detail. Patient expressed understanding regarding what to do in case of urgent or emergency type symptoms.  Medication list was reconciled, printed and provided to the patient in AVS. Patient instructions and summary information was reviewed with the patient as documented in the AVS. This note was prepared with assistance of Dragon voice recognition software. Occasional wrong-word or sound-a-like substitutions may have occurred due to the inherent limitation

## 2022-05-17 ENCOUNTER — Other Ambulatory Visit: Payer: Self-pay | Admitting: *Deleted

## 2022-05-17 ENCOUNTER — Encounter: Payer: Self-pay | Admitting: Pharmacy Technician

## 2022-05-17 ENCOUNTER — Other Ambulatory Visit: Payer: Self-pay | Admitting: Internal Medicine

## 2022-05-17 ENCOUNTER — Other Ambulatory Visit (HOSPITAL_COMMUNITY): Payer: Self-pay

## 2022-05-17 DIAGNOSIS — C719 Malignant neoplasm of brain, unspecified: Secondary | ICD-10-CM

## 2022-05-17 MED ORDER — TEMOZOLOMIDE 140 MG PO CAPS: 150.0000 mg/m2/d | ORAL_CAPSULE | Freq: Every day | ORAL | 0 refills | Status: DC

## 2022-05-17 NOTE — Telephone Encounter (Signed)
Patient requested refill of Temodar sent to [facility]. Refill sent by Dr. Barbaraann Cao

## 2022-05-18 ENCOUNTER — Other Ambulatory Visit: Payer: Self-pay

## 2022-05-31 ENCOUNTER — Inpatient Hospital Stay: Payer: 59 | Attending: Radiation Oncology

## 2022-05-31 ENCOUNTER — Other Ambulatory Visit: Payer: Self-pay

## 2022-05-31 ENCOUNTER — Inpatient Hospital Stay (HOSPITAL_BASED_OUTPATIENT_CLINIC_OR_DEPARTMENT_OTHER): Payer: 59 | Admitting: Internal Medicine

## 2022-05-31 VITALS — BP 129/91 | HR 56 | Temp 98.2°F | Resp 20 | Wt 156.2 lb

## 2022-05-31 DIAGNOSIS — I10 Essential (primary) hypertension: Secondary | ICD-10-CM | POA: Diagnosis not present

## 2022-05-31 DIAGNOSIS — C711 Malignant neoplasm of frontal lobe: Secondary | ICD-10-CM | POA: Insufficient documentation

## 2022-05-31 DIAGNOSIS — R251 Tremor, unspecified: Secondary | ICD-10-CM | POA: Diagnosis not present

## 2022-05-31 DIAGNOSIS — R112 Nausea with vomiting, unspecified: Secondary | ICD-10-CM | POA: Diagnosis not present

## 2022-05-31 DIAGNOSIS — Z79630 Long term (current) use of alkylating agent: Secondary | ICD-10-CM | POA: Diagnosis not present

## 2022-05-31 DIAGNOSIS — Z8349 Family history of other endocrine, nutritional and metabolic diseases: Secondary | ICD-10-CM | POA: Diagnosis not present

## 2022-05-31 DIAGNOSIS — C719 Malignant neoplasm of brain, unspecified: Secondary | ICD-10-CM

## 2022-05-31 DIAGNOSIS — Z79899 Other long term (current) drug therapy: Secondary | ICD-10-CM | POA: Insufficient documentation

## 2022-05-31 DIAGNOSIS — Z8249 Family history of ischemic heart disease and other diseases of the circulatory system: Secondary | ICD-10-CM | POA: Diagnosis not present

## 2022-05-31 DIAGNOSIS — M3500 Sicca syndrome, unspecified: Secondary | ICD-10-CM | POA: Insufficient documentation

## 2022-05-31 LAB — CBC WITH DIFFERENTIAL (CANCER CENTER ONLY)
Abs Immature Granulocytes: 0 10*3/uL (ref 0.00–0.07)
Basophils Absolute: 0 10*3/uL (ref 0.0–0.1)
Basophils Relative: 0 %
Eosinophils Absolute: 0 10*3/uL (ref 0.0–0.5)
Eosinophils Relative: 1 %
HCT: 36.9 % (ref 36.0–46.0)
Hemoglobin: 13.4 g/dL (ref 12.0–15.0)
Immature Granulocytes: 0 %
Lymphocytes Relative: 18 %
Lymphs Abs: 0.5 10*3/uL — ABNORMAL LOW (ref 0.7–4.0)
MCH: 32 pg (ref 26.0–34.0)
MCHC: 36.3 g/dL — ABNORMAL HIGH (ref 30.0–36.0)
MCV: 88.1 fL (ref 80.0–100.0)
Monocytes Absolute: 0.3 10*3/uL (ref 0.1–1.0)
Monocytes Relative: 11 %
Neutro Abs: 2 10*3/uL (ref 1.7–7.7)
Neutrophils Relative %: 70 %
Platelet Count: 173 10*3/uL (ref 150–400)
RBC: 4.19 MIL/uL (ref 3.87–5.11)
RDW: 12.5 % (ref 11.5–15.5)
WBC Count: 2.8 10*3/uL — ABNORMAL LOW (ref 4.0–10.5)
nRBC: 0 % (ref 0.0–0.2)

## 2022-05-31 LAB — CMP (CANCER CENTER ONLY)
ALT: 10 U/L (ref 0–44)
AST: 12 U/L — ABNORMAL LOW (ref 15–41)
Albumin: 4.3 g/dL (ref 3.5–5.0)
Alkaline Phosphatase: 50 U/L (ref 38–126)
Anion gap: 3 — ABNORMAL LOW (ref 5–15)
BUN: 13 mg/dL (ref 6–20)
CO2: 28 mmol/L (ref 22–32)
Calcium: 9.6 mg/dL (ref 8.9–10.3)
Chloride: 105 mmol/L (ref 98–111)
Creatinine: 0.49 mg/dL (ref 0.44–1.00)
GFR, Estimated: >60 mL/min (ref >60)
Glucose, Bld: 92 mg/dL (ref 70–99)
Potassium: 3.8 mmol/L (ref 3.5–5.1)
Sodium: 136 mmol/L (ref 135–145)
Total Bilirubin: 0.6 mg/dL (ref 0.3–1.2)
Total Protein: 6.9 g/dL (ref 6.5–8.1)

## 2022-05-31 NOTE — Progress Notes (Signed)
[facility] [address]  [place], [place] [address] [phone]   Interval Evaluation  Date of Service: 05/31/22 Patient Name: Joanne Jackson Patient MRN: [id number] Patient DOB: 1975-02-13 Provider: Henreitta Leber, MD  Identifying Statement:  Joanne Jackson is a 47 y.o. female with right frontal  high grade glioma, IDH mt    Oncologic History: Oncology History  High grade 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 by [facility] criteria  05/18/2021 Surgery   Right frontal craniotomy, resection with Dr. Zachery Conch at [facility]; path demonstrates High Grade Glioma IDH-21mt.   06/19/2021 - 06/19/2021 Chemotherapy   Patient is on Treatment Plan : BRAIN GLIOBLASTOMA Radiation Therapy With Concurrent Temozolomide 75 mg/m2 Daily Followed By Sequential Maintenance Temozolomide x 6-12 cycles     08/28/2021 -  Chemotherapy   Patient is on Treatment Plan : BRAIN GLIOBLASTOMA Consolidation Temozolomide Days 1-5 q28 Days        Biomarkers:  MGMT Unknown.  IDH 1/2 Mutated.  EGFR Unknown  TERT Unmutated   Interval History: Joanne Jackson presents today for follow up, now having completed cycle #8 adjuvant Temodar.  Denies new or progressive changes.  Continues on Keppra  twice per day.  Continues on melatonin, remeron, and trazodone as prior.  No further seizures.  H+P (06/12/21) Patient presented to medical attention in March 2023 with new onset seizures, characterized by shaking of the left hand without impaired awareness.  CNS imaging demonstrated non enhancing mass within right frontal lobe, c/w primary neoplasm.  She underwent craniotomy, resection on 05/18/21 with Dr. Zachery Conch at [facility]; pathology was High Grade Glioma IDHmt.  Since surgery, husband has noticed more blunted (depressed?) mood, less responsive and engaging.  Otherwise she is fully independent and functional.  Referred to [facility] from [facility] for local implementation of radiation and chemotherapy.  Medications: Current  Outpatient Medications on File Prior to Visit  Medication Sig Dispense Refill   Cholecalciferol (VITAMIN D3) 50 MCG (2000 UT) TABS Take 2,000 Units by mouth daily as needed.      docusate sodium (COLACE) 100 MG capsule Take 1 capsule (100 mg total) by mouth 2 (two) times daily. (Patient taking differently: Take 100 mg by mouth as needed.) 10 capsule 0   ferrous sulfate 324 (65 Fe) MG TBEC TAKE 1 TABLET BY MOUTH EVERY DAY (Patient taking differently: Take 324 mg by mouth daily.) 90 tablet 1   levETIRAcetam (KEPPRA) 500 MG tablet TAKE 1 TABLET BY MOUTH TWICE A DAY 180 tablet 3   lisinopril (ZESTRIL) 10 MG tablet Take 1 tablet (10 mg total) by mouth daily. 90 tablet 3   melatonin 3 MG TABS tablet Take 3 mg by mouth at bedtime.     mirtazapine (REMERON) 7.5 MG tablet Take 1 tablet (7.5 mg total) by mouth at bedtime.     omeprazole (PRILOSEC) 20 MG capsule Take 20 mg by mouth daily.     ondansetron (ZOFRAN) 8 MG tablet TAKE 1 TABLET BY MOUTH 2 TIMES DAILY AS NEEDED FOR NAUSEA AND VOMITING. MAY TAKE 30-60 MINUTES PRIOR TO TEMODAR ADMINISTRATION IF NAUSEA/VOMITING OCCURS. 60 tablet 1   polyethylene glycol powder (GLYCOLAX/MIRALAX) 17 GM/SCOOP powder Take by mouth.     RESTASIS 0.05 % ophthalmic emulsion Place 1 drop into both eyes 2 (two) times daily.     temozolomide (TEMODAR) 140 MG capsule Take 2 capsules (280 mg total) by mouth at bedtime. 10 capsule 0   traZODone (DESYREL) 50 MG tablet Take 50 mg by mouth at bedtime.  No current facility-administered medications on file prior to visit.    Allergies: No Known Allergies Past Medical History:  Past Medical History:  Diagnosis Date   Brain cancer    Glioma 05/18/2021   Hypertension    Pre-diabetes    Sjogren's syndrome with keratoconjunctivitis sicca    Past Surgical History:  Past Surgical History:  Procedure Laterality Date   BRAIN SURGERY  05/2021   tumor removal   CESAREAN SECTION     DILATION AND CURETTAGE, DIAGNOSTIC /  THERAPEUTIC     Social History:  Social History   Socioeconomic History   Marital status: Married    Spouse name: Not on file   Number of children: 1   Years of education: Not on file   Highest education level: Associate degree: academic program  Occupational History   Occupation: Accounting  Tobacco Use   Smoking status: Never    Passive exposure: Never   Smokeless tobacco: Never  Vaping Use   Vaping Use: Never used  Substance and Sexual Activity   Alcohol use: Not Currently   Drug use: Never   Sexual activity: Yes    Partners: Male    Birth control/protection: Other-see comments, Condom    Comment: husband is infertile  Other Topics Concern   Not on file  Social History Narrative   Not on file   Social Determinants of Health   Financial Resource Strain: Low Risk  (05/12/2022)   Overall Financial Resource Strain (CARDIA)    Difficulty of Paying Living Expenses: Not hard at all  Food Insecurity: No Food Insecurity (05/12/2022)   Hunger Vital Sign    Worried About Running Out of Food in the Last Year: Never true    Ran Out of Food in the Last Year: Never true  Transportation Needs: No Transportation Needs (05/12/2022)   PRAPARE - Administrator, Civil Service (Medical): No    Lack of Transportation (Non-Medical): No  Physical Activity: Sufficiently Active (05/12/2022)   Exercise Vital Sign    Days of Exercise per Week: 7 days    Minutes of Exercise per Session: 50 min  Stress: No Stress Concern Present (05/12/2022)   [employer] of Occupational Health - Occupational Stress Questionnaire    Feeling of Stress : Not at all  Social Connections: Moderately Integrated (05/12/2022)   Social Connection and Isolation Panel [NHANES]    Frequency of Communication with Friends and Family: More than three times a week    Frequency of Social Gatherings with Friends and Family: More than three times a week    Attends Religious Services: More than 4 times per year     Active Member of [employer] or Organizations: No    Attends Engineer, structural: Not on file    Marital Status: Married  Catering manager Violence: Not on file   Family History:  Family History  Problem Relation Age of Onset   Thyroid disease Mother    Heart attack Father 47   Hypertension Father    Healthy Brother    Healthy Paternal Grandfather    Healthy Daughter     Review of Systems: Constitutional: Doesn't report fevers, chills or abnormal weight loss Eyes: Doesn't report blurriness of vision Ears, nose, mouth, throat, and face: Doesn't report sore throat Respiratory: Doesn't report cough, dyspnea or wheezes Cardiovascular: Doesn't report palpitation, chest discomfort  Gastrointestinal:  Doesn't report nausea, constipation, diarrhea GU: Doesn't report incontinence Skin: Doesn't report skin rashes Neurological: Per HPI Musculoskeletal: Doesn't report  joint pain Behavioral/Psych: Doesn't report anxiety  Physical Exam: Vitals:   05/31/22 1230  BP: (!) 129/91  Pulse: (!) 56  Resp: 20  Temp: 98.2 F (36.8 C)  SpO2: 99%     KPS: 90. General: Alert, cooperative, pleasant, in no acute distress Head: Normal EENT: No conjunctival injection or scleral icterus.  Lungs: Resp effort normal Cardiac: Regular rate Abdomen: Non-distended abdomen Skin: No rashes cyanosis or petechiae. Extremities: No clubbing or edema  Neurologic Exam: Mental Status: Awake, alert, attentive to examiner. Oriented to self and environment. Language is fluent with intact comprehension. Abulic features noted. Cranial Nerves: Visual acuity is grossly normal. Visual fields are full. Extra-ocular movements intact. No ptosis. Face is symmetric Motor: Tone and bulk are normal. Power is full in both arms and legs. Reflexes are symmetric, no pathologic reflexes present.  Sensory: Intact to light touch Gait: Normal.   Labs: I have reviewed the data as listed    Component Value Date/Time   NA  136 05/31/2022 1130   K 3.8 05/31/2022 1130   CL 105 05/31/2022 1130   CO2 28 05/31/2022 1130   GLUCOSE 92 05/31/2022 1130   BUN 13 05/31/2022 1130   CREATININE 0.49 05/31/2022 1130   CREATININE 0.50 11/24/2019 1449   CALCIUM 9.6 05/31/2022 1130   PROT 6.9 05/31/2022 1130   ALBUMIN 4.3 05/31/2022 1130   AST 12 (L) 05/31/2022 1130   ALT 10 05/31/2022 1130   ALKPHOS 50 05/31/2022 1130   BILITOT 0.6 05/31/2022 1130   GFRNONAA >60 05/31/2022 1130   GFRNONAA 119 11/24/2019 1449   GFRAA 138 11/24/2019 1449   Lab Results  Component Value Date   WBC 2.8 (L) 05/31/2022   NEUTROABS 2.0 05/31/2022   HGB 13.4 05/31/2022   HCT 36.9 05/31/2022   MCV 88.1 05/31/2022   PLT 173 05/31/2022    Imaging:    Assessment/Plan 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  No new or progressive changes.  We recommended continuing treatment with cycle #10 Temozolomide 150 mg/m2, on for five days and off for twenty three days in twenty eight day cycles. The patient will have a complete blood count performed on days 21 and 28 of each cycle, and a comprehensive metabolic panel performed on day 28 of each cycle. Labs may need to be performed more often. Zofran will prescribed for home use for nausea/vomiting.   Cycle #10 will begin 06/05/22.  We will see if we can obtain this [facility] per patient preference.  Chemotherapy should be held for the following:  ANC less than 1,000  Platelets less than 100,000  LFT or creatinine greater than 2x ULN  If clinical concerns/contraindications develop  May con't trazodone to 50mg  HS, also on Lunesta 2mg .    Keppra will con't 500mg  BID.  We appreciate the opportunity to participate in the care of Guillermo Jardin.   We ask that Lanelle Lindo return to clinic in 1 months with labs for evaluation prior to cycle #11, or sooner as needed.  All questions  were answered. The patient knows to call the clinic with any problems, questions or concerns. No barriers to learning were detected.  The total time spent in the encounter was 30 minutes and more than 50% was on counseling and review of test results   Henreitta Leber, MD Medical Director of Neuro-Oncology [facility] 05/31/22 12:20  PM

## 2022-06-19 ENCOUNTER — Other Ambulatory Visit: Payer: Self-pay | Admitting: Family Medicine

## 2022-06-26 ENCOUNTER — Ambulatory Visit (INDEPENDENT_AMBULATORY_CARE_PROVIDER_SITE_OTHER): Payer: 59

## 2022-06-26 ENCOUNTER — Encounter: Payer: Self-pay | Admitting: Obstetrics & Gynecology

## 2022-06-26 ENCOUNTER — Ambulatory Visit (INDEPENDENT_AMBULATORY_CARE_PROVIDER_SITE_OTHER): Payer: 59 | Admitting: Obstetrics & Gynecology

## 2022-06-26 VITALS — BP 108/70 | HR 69

## 2022-06-26 DIAGNOSIS — N914 Secondary oligomenorrhea: Secondary | ICD-10-CM | POA: Diagnosis not present

## 2022-06-26 DIAGNOSIS — N926 Irregular menstruation, unspecified: Secondary | ICD-10-CM

## 2022-06-26 NOTE — Progress Notes (Signed)
    Joanne Jackson April 25, 1975 [id number]        47 y.o.  G1P1L1   RP: Oligomenorrhea for Pelvic US  HPI:  Had Brain surgery for cancer, followed by Chemotherapy in 05/2021.  Now on oral chemotherapy 5 days per month until next month.  Spaced her periods from 01/2022 to 04/2022.  Then had light periods in 05/2022 and 06/08/22.     OB History  Gravida Para Term Preterm AB Living  1 1 1     1   SAB IAB Ectopic Multiple Live Births               # Outcome Date GA Lbr Len/2nd Weight Sex Delivery Anes PTL Lv  1 Term             Past medical history,surgical history, problem list, medications, allergies, family history and social history were all reviewed and documented in the EPIC chart.   Directed ROS with pertinent positives and negatives documented in the history of present illness/assessment and plan.  Exam:  Vitals:   06/26/22 1440  BP: 108/70  Pulse: 69  SpO2: 99%   General appearance:  Normal  Pelvic US today: T/V images.  No latex used.  Retroverted uterus normal in size and shape with 2 small subcentimeter intramural fibroids noted.  The uterus is measured at 6.88 x 5.26 x 4.3 cm.  The endometrial lining is thin and symmetrical with no mass or thickening seen.  It is measured at 1.52 mm.  Both ovaries are normal.  No adnexal mass seen.  No free fluid in the pelvis.   Assessment/Plan:  47 y.o. G1P1001   1. Secondary oligomenorrhea Had Brain surgery for cancer, followed by Chemotherapy in 05/2021.  Now on oral chemotherapy 5 days per month until next month.  Spaced her periods from 01/2022 to 04/2022.  Then had light periods in 05/2022 and 06/08/22.  Pelvic US today showing a thin endometrial line at 1.52 mm.  Will recheck [facility]. [facility]   Genia Del MD, 3:11 PM 06/26/2022

## 2022-06-27 ENCOUNTER — Encounter: Payer: Self-pay | Admitting: Rheumatology

## 2022-06-27 ENCOUNTER — Encounter: Payer: Self-pay | Admitting: Obstetrics & Gynecology

## 2022-06-27 LAB — FOLLICLE STIMULATING HORMONE: FSH: 31.3 m[IU]/mL

## 2022-06-28 ENCOUNTER — Encounter: Payer: Self-pay | Admitting: Internal Medicine

## 2022-06-28 ENCOUNTER — Inpatient Hospital Stay: Payer: 59

## 2022-06-28 ENCOUNTER — Inpatient Hospital Stay: Payer: 59 | Attending: Radiation Oncology | Admitting: Internal Medicine

## 2022-06-28 VITALS — BP 132/88 | HR 67 | Temp 97.9°F | Resp 17 | Ht 59.0 in | Wt 152.1 lb

## 2022-06-28 DIAGNOSIS — M3500 Sicca syndrome, unspecified: Secondary | ICD-10-CM | POA: Diagnosis not present

## 2022-06-28 DIAGNOSIS — R200 Anesthesia of skin: Secondary | ICD-10-CM | POA: Diagnosis not present

## 2022-06-28 DIAGNOSIS — Z79899 Other long term (current) drug therapy: Secondary | ICD-10-CM | POA: Insufficient documentation

## 2022-06-28 DIAGNOSIS — R251 Tremor, unspecified: Secondary | ICD-10-CM | POA: Diagnosis not present

## 2022-06-28 DIAGNOSIS — Z8249 Family history of ischemic heart disease and other diseases of the circulatory system: Secondary | ICD-10-CM | POA: Diagnosis not present

## 2022-06-28 DIAGNOSIS — Z8349 Family history of other endocrine, nutritional and metabolic diseases: Secondary | ICD-10-CM | POA: Insufficient documentation

## 2022-06-28 DIAGNOSIS — C719 Malignant neoplasm of brain, unspecified: Secondary | ICD-10-CM

## 2022-06-28 DIAGNOSIS — Z79630 Long term (current) use of alkylating agent: Secondary | ICD-10-CM | POA: Diagnosis not present

## 2022-06-28 DIAGNOSIS — I10 Essential (primary) hypertension: Secondary | ICD-10-CM | POA: Insufficient documentation

## 2022-06-28 DIAGNOSIS — C711 Malignant neoplasm of frontal lobe: Secondary | ICD-10-CM | POA: Diagnosis present

## 2022-06-28 LAB — CMP (CANCER CENTER ONLY)
ALT: 11 U/L (ref 0–44)
AST: 13 U/L — ABNORMAL LOW (ref 15–41)
Albumin: 4.6 g/dL (ref 3.5–5.0)
Alkaline Phosphatase: 59 U/L (ref 38–126)
Anion gap: 6 (ref 5–15)
BUN: 16 mg/dL (ref 6–20)
CO2: 28 mmol/L (ref 22–32)
Calcium: 9.6 mg/dL (ref 8.9–10.3)
Chloride: 106 mmol/L (ref 98–111)
Creatinine: 0.53 mg/dL (ref 0.44–1.00)
GFR, Estimated: >60 mL/min (ref >60)
Glucose, Bld: 98 mg/dL (ref 70–99)
Potassium: 4 mmol/L (ref 3.5–5.1)
Sodium: 140 mmol/L (ref 135–145)
Total Bilirubin: 0.6 mg/dL (ref 0.3–1.2)
Total Protein: 7.4 g/dL (ref 6.5–8.1)

## 2022-06-28 LAB — CBC WITH DIFFERENTIAL (CANCER CENTER ONLY)
Abs Immature Granulocytes: 0 10*3/uL (ref 0.00–0.07)
Basophils Absolute: 0 10*3/uL (ref 0.0–0.1)
Basophils Relative: 0 %
Eosinophils Absolute: 0 10*3/uL (ref 0.0–0.5)
Eosinophils Relative: 1 %
HCT: 40 % (ref 36.0–46.0)
Hemoglobin: 14.1 g/dL (ref 12.0–15.0)
Immature Granulocytes: 0 %
Lymphocytes Relative: 14 %
Lymphs Abs: 0.5 10*3/uL — ABNORMAL LOW (ref 0.7–4.0)
MCH: 30.9 pg (ref 26.0–34.0)
MCHC: 35.3 g/dL (ref 30.0–36.0)
MCV: 87.5 fL (ref 80.0–100.0)
Monocytes Absolute: 0.3 10*3/uL (ref 0.1–1.0)
Monocytes Relative: 9 %
Neutro Abs: 2.9 10*3/uL (ref 1.7–7.7)
Neutrophils Relative %: 76 %
Platelet Count: 177 10*3/uL (ref 150–400)
RBC: 4.57 MIL/uL (ref 3.87–5.11)
RDW: 11.9 % (ref 11.5–15.5)
WBC Count: 3.8 10*3/uL — ABNORMAL LOW (ref 4.0–10.5)
nRBC: 0 % (ref 0.0–0.2)

## 2022-06-28 MED ORDER — TRAZODONE HCL 50 MG PO TABS: 50.0000 mg | ORAL_TABLET | Freq: Every day | ORAL | 2 refills | Status: DC

## 2022-06-28 MED ORDER — MELATONIN 3 MG PO TABS: 3.0000 mg | ORAL_TABLET | Freq: Every day | ORAL | 3 refills | Status: AC

## 2022-06-28 NOTE — Progress Notes (Signed)
[facility] at [facility] [address]  [place], [place] [address] [phone]   Interval Evaluation  Date of Service: 06/28/22 Patient Name: Joanne Jackson Patient MRN: [id number] Patient DOB: 01-09-1976 Provider: Henreitta Leber, MD  Identifying Statement:  Joanne Jackson is a 47 y.o. 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 Zachery Conch at [facility]; path demonstrates High Grade Glioma IDH-11mt.   06/19/2021 - 06/19/2021 Chemotherapy   Patient is on Treatment Plan : BRAIN GLIOBLASTOMA Radiation Therapy With Concurrent Temozolomide 75 mg/m2 Daily Followed By Sequential Maintenance Temozolomide x 6-12 cycles     08/28/2021 -  Chemotherapy   Patient is on Treatment Plan : BRAIN GLIOBLASTOMA Consolidation Temozolomide Days 1-5 q28 Days        Biomarkers:  MGMT Unknown.  IDH 1/2 Mutated.  EGFR Unknown  TERT Unmutated   Interval History: Joanne Jackson presents today for follow up, now having completed cycle #10 adjuvant Temodar.  She did have an episode of sudden onset left arm and hand face numbness this month following chemo, resolved after minutes.  Denies new or progressive changes.  Continues on Keppra 500mg  twice per day.  Continues on melatonin, remeron, and trazodone as prior.  No further frank seizures.  H+P (06/12/21) Patient presented to medical attention in March 2023 with new onset seizures, characterized by shaking of the left hand without impaired awareness.  CNS imaging demonstrated non enhancing mass within right frontal lobe, c/w primary neoplasm.  She underwent craniotomy, resection on 05/18/21 with Dr. Zachery Conch at [facility]; pathology was High Grade Glioma IDHmt.  Since surgery, husband has noticed more blunted (depressed?) mood, less responsive and engaging.  Otherwise she is fully  independent and functional.  Referred to [facility] from [facility] for local implementation of radiation and chemotherapy.  Medications: Current Outpatient Medications on File Prior to Visit  Medication Sig Dispense Refill   Cholecalciferol (VITAMIN D3) 50 MCG (2000 UT) TABS Take 2,000 Units by mouth daily as needed.      docusate sodium (COLACE) 100 MG capsule Take 1 capsule (100 mg total) by mouth 2 (two) times daily. (Patient taking differently: Take 100 mg by mouth as needed.) 10 capsule 0   ferrous sulfate 324 (65 Fe) MG TBEC TAKE 1 TABLET BY MOUTH EVERY DAY (Patient taking differently: Take 324 mg by mouth daily.) 90 tablet 1   levETIRAcetam (KEPPRA) 500 MG tablet TAKE 1 TABLET BY MOUTH TWICE A DAY 180 tablet 3   lisinopril (ZESTRIL) 10 MG tablet TAKE 1 TABLET BY MOUTH EVERY DAY 90 tablet 3   melatonin 3 MG TABS tablet Take 3 mg by mouth at bedtime.     mirtazapine (REMERON) 7.5 MG tablet Take 1 tablet (7.5 mg total) by mouth at bedtime.     omeprazole (PRILOSEC) 20 MG capsule Take 20 mg by mouth daily.     ondansetron (ZOFRAN) 8 MG tablet TAKE 1 TABLET BY MOUTH 2 TIMES DAILY AS NEEDED FOR NAUSEA AND VOMITING. MAY TAKE 30-60 MINUTES PRIOR TO TEMODAR ADMINISTRATION IF NAUSEA/VOMITING OCCURS. 60 tablet 1   polyethylene glycol powder (GLYCOLAX/MIRALAX) 17 GM/SCOOP powder Take by mouth.     RESTASIS 0.05 % ophthalmic emulsion Place 1 drop into both eyes 2 (two) times daily.     temozolomide (TEMODAR) 140 MG capsule Take 2 capsules (280 mg total)  by mouth at bedtime. 10 capsule 0   traZODone (DESYREL) 50 MG tablet Take 50 mg by mouth at bedtime.     No current facility-administered medications on file prior to visit.    Allergies: No Known Allergies Past Medical History:  Past Medical History:  Diagnosis Date   Brain cancer (HCC)    Glioma (HCC) 05/18/2021   Hypertension    Pre-diabetes    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HCC)    Past Surgical History:  Past Surgical  History:  Procedure Laterality Date   BRAIN SURGERY  05/2021   tumor removal   CESAREAN SECTION     DILATION AND CURETTAGE, DIAGNOSTIC / THERAPEUTIC     Social History:  Social History   Socioeconomic History   Marital status: Married    Spouse name: Not on file   Number of children: 1   Years of education: Not on file   Highest education level: Associate degree: academic program  Occupational History   Occupation: Accounting  Tobacco Use   Smoking status: Never    Passive exposure: Never   Smokeless tobacco: Never  Vaping Use   Vaping Use: Never used  Substance and Sexual Activity   Alcohol use: Not Currently   Drug use: Never   Sexual activity: Yes    Partners: Male    Birth control/protection: Other-see comments, Condom    Comment: husband is infertile  Other Topics Concern   Not on file  Social History Narrative   Not on file   Social Determinants of Health   Financial Resource Strain: Low Risk  (05/12/2022)   Overall Financial Resource Strain (CARDIA)    Difficulty of Paying Living Expenses: Not hard at all  Food Insecurity: No Food Insecurity (05/12/2022)   Hunger Vital Sign    Worried About Running Out of Food in the Last Year: Never true    Ran Out of Food in the Last Year: Never true  Transportation Needs: No Transportation Needs (05/12/2022)   PRAPARE - Administrator, Civil Service (Medical): No    Lack of Transportation (Non-Medical): No  Physical Activity: Sufficiently Active (05/12/2022)   Exercise Vital Sign    Days of Exercise per Week: 7 days    Minutes of Exercise per Session: 50 min  Stress: No Stress Concern Present (05/12/2022)   [employer] of Occupational Health - Occupational Stress Questionnaire    Feeling of Stress : Not at all  Social Connections: Moderately Integrated (05/12/2022)   Social Connection and Isolation Panel [NHANES]    Frequency of Communication with Friends and Family: More than three times a week    Frequency  of Social Gatherings with Friends and Family: More than three times a week    Attends Religious Services: More than 4 times per year    Active Member of [employer] or Organizations: No    Attends Engineer, structural: Not on file    Marital Status: Married  Catering manager Violence: Not on file   Family History:  Family History  Problem Relation Age of Onset   Thyroid disease Mother    Heart attack Father 35   Hypertension Father    Healthy Brother    Healthy Paternal Grandfather    Healthy Daughter     Review of Systems: Constitutional: Doesn't report fevers, chills or abnormal weight loss Eyes: Doesn't report blurriness of vision Ears, nose, mouth, throat, and face: Doesn't report sore throat Respiratory: Doesn't report cough, dyspnea or wheezes Cardiovascular:  Doesn't report palpitation, chest discomfort  Gastrointestinal:  Doesn't report nausea, constipation, diarrhea GU: Doesn't report incontinence Skin: Doesn't report skin rashes Neurological: Per HPI Musculoskeletal: Doesn't report joint pain Behavioral/Psych: Doesn't report anxiety  Physical Exam: Vitals:   06/28/22 1128  BP: 132/88  Pulse: 67  Resp: 17  Temp: 97.9 F (36.6 C)  SpO2: 98%     KPS: 90. General: Alert, cooperative, pleasant, in no acute distress Head: Normal EENT: No conjunctival injection or scleral icterus.  Lungs: Resp effort normal Cardiac: Regular rate Abdomen: Non-distended abdomen Skin: No rashes cyanosis or petechiae. Extremities: No clubbing or edema  Neurologic Exam: Mental Status: Awake, alert, attentive to examiner. Oriented to self and environment. Language is fluent with intact comprehension. Abulic features noted. Cranial Nerves: Visual acuity is grossly normal. Visual fields are full. Extra-ocular movements intact. No ptosis. Face is symmetric Motor: Tone and bulk are normal. Power is full in both arms and legs. Reflexes are symmetric, no pathologic reflexes present.   Sensory: Intact to light touch Gait: Normal.   Labs: I have reviewed the data as listed    Component Value Date/Time   NA 136 05/31/2022 1130   K 3.8 05/31/2022 1130   CL 105 05/31/2022 1130   CO2 28 05/31/2022 1130   GLUCOSE 92 05/31/2022 1130   BUN 13 05/31/2022 1130   CREATININE 0.49 05/31/2022 1130   CREATININE 0.50 11/24/2019 1449   CALCIUM 9.6 05/31/2022 1130   PROT 6.9 05/31/2022 1130   ALBUMIN 4.3 05/31/2022 1130   AST 12 (L) 05/31/2022 1130   ALT 10 05/31/2022 1130   ALKPHOS 50 05/31/2022 1130   BILITOT 0.6 05/31/2022 1130   GFRNONAA >60 05/31/2022 1130   GFRNONAA 119 11/24/2019 1449   GFRAA 138 11/24/2019 1449   Lab Results  Component Value Date   WBC 3.8 (L) 06/28/2022   NEUTROABS 2.9 06/28/2022   HGB 14.1 06/28/2022   HCT 40.0 06/28/2022   MCV 87.5 06/28/2022   PLT 177 06/28/2022    Imaging:    Assessment/Plan 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  Left arm episode last month may have been a focal seizure.  We will con't to monitor without adjusting AED for now.  We recommended continuing treatment with cycle #11 Temozolomide 150 mg/m2, on for five days and off for twenty three days in twenty eight day cycles. The patient will have a complete blood count performed on days 21 and 28 of each cycle, and a comprehensive metabolic panel performed on day 28 of each cycle. Labs may need to be performed more often. Zofran will prescribed for home use for nausea/vomiting.   Cycle #10 will begin 06/05/22.  We will see if we can obtain this [facility] per patient preference.  Chemotherapy should be held for the following:  ANC less than 1,000  Platelets less than 100,000  LFT or creatinine greater than 2x ULN  If clinical concerns/contraindications develop  May con't trazodone to 50mg  HS, also on Lunesta 2mg .    Keppra will con't 500mg   BID.  We appreciate the opportunity to participate in the care of Joanne Jackson.   We ask that Joanne Jackson return to clinic in 1 months with MRI brain for evaluation prior to cycle #12, or sooner as needed.  All questions were answered. The patient knows to call the clinic with any problems, questions or concerns. No barriers to  learning were detected.  The total time spent in the encounter was 30 minutes and more than 50% was on counseling and review of test results   Henreitta Leber, MD Medical Director of Neuro-Oncology [facility] at [facility] 06/28/22 11:48 AM

## 2022-06-29 ENCOUNTER — Other Ambulatory Visit: Payer: Self-pay

## 2022-06-29 DIAGNOSIS — C719 Malignant neoplasm of brain, unspecified: Secondary | ICD-10-CM

## 2022-06-29 MED ORDER — TEMOZOLOMIDE 140 MG PO CAPS
150.0000 mg/m2/d | ORAL_CAPSULE | Freq: Every day | ORAL | 0 refills | Status: DC
Start: 2022-06-29 — End: 2022-10-02

## 2022-06-30 ENCOUNTER — Other Ambulatory Visit: Payer: Self-pay

## 2022-07-03 ENCOUNTER — Encounter: Payer: Self-pay | Admitting: Internal Medicine

## 2022-07-03 MED ORDER — TEMOZOLOMIDE 140 MG PO CAPS
150.0000 mg/m2/d | ORAL_CAPSULE | Freq: Every day | ORAL | 0 refills | Status: DC
Start: 2022-07-03 — End: 2022-10-02

## 2022-07-16 LAB — HM DIABETES EYE EXAM

## 2022-07-17 ENCOUNTER — Encounter: Payer: Self-pay | Admitting: Family Medicine

## 2022-07-20 ENCOUNTER — Other Ambulatory Visit: Payer: Self-pay | Admitting: Internal Medicine

## 2022-07-23 ENCOUNTER — Encounter: Payer: Self-pay | Admitting: Internal Medicine

## 2022-07-28 ENCOUNTER — Encounter: Payer: Self-pay | Admitting: Family Medicine

## 2022-08-06 ENCOUNTER — Ambulatory Visit
Admission: RE | Admit: 2022-08-06 | Discharge: 2022-08-06 | Disposition: A | Discharge status: Home / Self Care | Payer: Self-pay | Source: Ambulatory Visit | Attending: Internal Medicine | Admitting: Internal Medicine

## 2022-08-06 ENCOUNTER — Inpatient Hospital Stay (HOSPITAL_BASED_OUTPATIENT_CLINIC_OR_DEPARTMENT_OTHER): Payer: 59 | Admitting: Internal Medicine

## 2022-08-06 ENCOUNTER — Inpatient Hospital Stay: Payer: 59 | Attending: Radiation Oncology

## 2022-08-06 ENCOUNTER — Other Ambulatory Visit: Payer: Self-pay | Admitting: *Deleted

## 2022-08-06 ENCOUNTER — Other Ambulatory Visit: Payer: Self-pay

## 2022-08-06 VITALS — BP 142/94 | HR 70 | Temp 97.9°F | Resp 15 | Wt 156.3 lb

## 2022-08-06 DIAGNOSIS — G40909 Epilepsy, unspecified, not intractable, without status epilepticus: Secondary | ICD-10-CM | POA: Diagnosis not present

## 2022-08-06 DIAGNOSIS — Z7952 Long term (current) use of systemic steroids: Secondary | ICD-10-CM | POA: Diagnosis not present

## 2022-08-06 DIAGNOSIS — Z8349 Family history of other endocrine, nutritional and metabolic diseases: Secondary | ICD-10-CM | POA: Diagnosis not present

## 2022-08-06 DIAGNOSIS — I10 Essential (primary) hypertension: Secondary | ICD-10-CM | POA: Insufficient documentation

## 2022-08-06 DIAGNOSIS — C711 Malignant neoplasm of frontal lobe: Secondary | ICD-10-CM | POA: Diagnosis present

## 2022-08-06 DIAGNOSIS — Z8249 Family history of ischemic heart disease and other diseases of the circulatory system: Secondary | ICD-10-CM | POA: Diagnosis not present

## 2022-08-06 DIAGNOSIS — R251 Tremor, unspecified: Secondary | ICD-10-CM | POA: Diagnosis not present

## 2022-08-06 DIAGNOSIS — Z79630 Long term (current) use of alkylating agent: Secondary | ICD-10-CM | POA: Insufficient documentation

## 2022-08-06 DIAGNOSIS — C719 Malignant neoplasm of brain, unspecified: Secondary | ICD-10-CM

## 2022-08-06 DIAGNOSIS — M3500 Sicca syndrome, unspecified: Secondary | ICD-10-CM | POA: Insufficient documentation

## 2022-08-06 DIAGNOSIS — Z79899 Other long term (current) drug therapy: Secondary | ICD-10-CM | POA: Diagnosis not present

## 2022-08-06 LAB — CMP (CANCER CENTER ONLY)
ALT: 14 U/L (ref 0–44)
AST: 16 U/L (ref 15–41)
Albumin: 4.2 g/dL (ref 3.5–5.0)
Alkaline Phosphatase: 50 U/L (ref 38–126)
Anion gap: 4 — ABNORMAL LOW (ref 5–15)
BUN: 11 mg/dL (ref 6–20)
CO2: 27 mmol/L (ref 22–32)
Calcium: 8.8 mg/dL — ABNORMAL LOW (ref 8.9–10.3)
Chloride: 107 mmol/L (ref 98–111)
Creatinine: 0.53 mg/dL (ref 0.44–1.00)
GFR, Estimated: >60 mL/min (ref >60)
Glucose, Bld: 82 mg/dL (ref 70–99)
Potassium: 3.7 mmol/L (ref 3.5–5.1)
Sodium: 138 mmol/L (ref 135–145)
Total Bilirubin: 0.9 mg/dL (ref 0.3–1.2)
Total Protein: 7.3 g/dL (ref 6.5–8.1)

## 2022-08-06 LAB — CBC WITH DIFFERENTIAL (CANCER CENTER ONLY)
Abs Immature Granulocytes: 0 10*3/uL (ref 0.00–0.07)
Basophils Absolute: 0 10*3/uL (ref 0.0–0.1)
Basophils Relative: 0 %
Eosinophils Absolute: 0 10*3/uL (ref 0.0–0.5)
Eosinophils Relative: 1 %
HCT: 39.1 % (ref 36.0–46.0)
Hemoglobin: 14.1 g/dL (ref 12.0–15.0)
Immature Granulocytes: 0 %
Lymphocytes Relative: 25 %
Lymphs Abs: 0.7 10*3/uL (ref 0.7–4.0)
MCH: 31.6 pg (ref 26.0–34.0)
MCHC: 36.1 g/dL — ABNORMAL HIGH (ref 30.0–36.0)
MCV: 87.7 fL (ref 80.0–100.0)
Monocytes Absolute: 0.4 10*3/uL (ref 0.1–1.0)
Monocytes Relative: 12 %
Neutro Abs: 1.8 10*3/uL (ref 1.7–7.7)
Neutrophils Relative %: 62 %
Platelet Count: 162 10*3/uL (ref 150–400)
RBC: 4.46 MIL/uL (ref 3.87–5.11)
RDW: 11.9 % (ref 11.5–15.5)
WBC Count: 2.9 10*3/uL — ABNORMAL LOW (ref 4.0–10.5)
nRBC: 0 % (ref 0.0–0.2)

## 2022-08-06 MED ORDER — LEVETIRACETAM 750 MG PO TABS: 750.0000 mg | ORAL_TABLET | Freq: Two times a day (BID) | ORAL | 2 refills | Status: DC

## 2022-08-06 MED ORDER — LORAZEPAM 2 MG PO TABS: 2.0000 mg | ORAL_TABLET | Freq: Four times a day (QID) | ORAL | 0 refills | Status: DC | PRN

## 2022-08-06 NOTE — Addendum Note (Signed)
Addended by: Henreitta Leber on: 08/06/2022 03:56 PM   Modules accepted: Orders

## 2022-08-06 NOTE — Progress Notes (Signed)
Requested MRI from [facility] completed on 08/01/2022

## 2022-08-06 NOTE — Progress Notes (Signed)
[facility] at [facility] [address]  [place], [place] [address] [phone]   Interval Evaluation  Date of Service: 08/06/22 Patient Name: Joanne Jackson Patient MRN: [id number] Patient DOB: Aug 07, 1975 Provider: Henreitta Leber, MD  Identifying Statement:  Joanne Jackson is a 47 y.o. 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 Zachery Conch at [facility]; path demonstrates High Grade Glioma IDH-34mt.   06/19/2021 - 06/19/2021 Chemotherapy   Patient is on Treatment Plan : BRAIN GLIOBLASTOMA Radiation Therapy With Concurrent Temozolomide 75 mg/m2 Daily Followed By Sequential Maintenance Temozolomide x 6-12 cycles     08/28/2021 -  Chemotherapy   Patient is on Treatment Plan : BRAIN GLIOBLASTOMA Consolidation Temozolomide Days 1-5 q28 Days        Biomarkers:  MGMT Unknown.  IDH 1/2 Mutated.  EGFR Unknown  TERT Unmutated   Interval History: Joanne Jackson presents today for follow up, now having completed cycle #11 adjuvant Temodar.  There was an additional seizure this month, same semiology, resolved after minutes.  Denies new or progressive changes.  Continues on Keppra 500mg  twice per day.  Continues on melatonin, remeron, and trazodone as prior.  Visit at [facility] last week.  H+P (06/12/21) Patient presented to medical attention in March 2023 with new onset seizures, characterized by shaking of the left hand without impaired awareness.  CNS imaging demonstrated non enhancing mass within right frontal lobe, c/w primary neoplasm.  She underwent craniotomy, resection on 05/18/21 with Dr. Zachery Conch at [facility]; pathology was High Grade Glioma IDHmt.  Since surgery, husband has noticed more blunted (depressed?) mood, less responsive and engaging.  Otherwise she is fully independent and functional.  Referred to [facility]  from [facility] for local implementation of radiation and chemotherapy.  Medications: Current Outpatient Medications on File Prior to Visit  Medication Sig Dispense Refill   Cholecalciferol (VITAMIN D3) 50 MCG (2000 UT) TABS Take 2,000 Units by mouth daily as needed.      dexamethasone (DECADRON) 4 MG tablet Take 4 mg by mouth See admin instructions. Take 1 tablet (4 mg total) by mouth daily with breakfast / Take one tablet (4mg ) on the morning of airplane flight departure and one day after as directed     docusate sodium (COLACE) 100 MG capsule Take 1 capsule (100 mg total) by mouth 2 (two) times daily. (Patient taking differently: Take 100 mg by mouth as needed.) 10 capsule 0   ferrous sulfate 324 (65 Fe) MG TBEC TAKE 1 TABLET BY MOUTH EVERY DAY (Patient taking differently: Take 324 mg by mouth daily.) 90 tablet 1   lisinopril (ZESTRIL) 10 MG tablet TAKE 1 TABLET BY MOUTH EVERY DAY 90 tablet 3   melatonin 3 MG TABS tablet Take 1 tablet (3 mg total) by mouth at bedtime. 60 tablet 3   mirtazapine (REMERON) 7.5 MG tablet Take 1 tablet (7.5 mg total) by mouth at bedtime.     omeprazole (PRILOSEC) 20 MG capsule Take 20 mg by mouth daily.     ondansetron (ZOFRAN) 8 MG tablet TAKE 1 TABLET BY MOUTH 2 TIMES DAILY AS NEEDED FOR NAUSEA AND VOMITING. MAY TAKE 30-60 MINUTES PRIOR TO TEMODAR ADMINISTRATION IF NAUSEA/VOMITING OCCURS. 60 tablet 1   polyethylene glycol powder (GLYCOLAX/MIRALAX) 17 GM/SCOOP powder Take by mouth.     RESTASIS 0.05 % ophthalmic emulsion Place  1 drop into both eyes 2 (two) times daily.     traZODone (DESYREL) 50 MG tablet TAKE 1 TABLET BY MOUTH EVERYDAY AT BEDTIME 90 tablet 2   temozolomide (TEMODAR) 140 MG capsule Take 2 capsules (280 mg total) by mouth at bedtime. (Patient not taking: Reported on 08/06/2022) 10 capsule 0   temozolomide (TEMODAR) 140 MG capsule Take 2 capsules (280 mg total) by mouth at bedtime. (Patient not taking: Reported on 08/06/2022) 10 capsule 0   No current  facility-administered medications on file prior to visit.    Allergies: No Known Allergies Past Medical History:  Past Medical History:  Diagnosis Date   Brain cancer (HCC)    Glioma (HCC) 05/18/2021   Hypertension    Pre-diabetes    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HCC)    Past Surgical History:  Past Surgical History:  Procedure Laterality Date   BRAIN SURGERY  05/2021   tumor removal   CESAREAN SECTION     DILATION AND CURETTAGE, DIAGNOSTIC / THERAPEUTIC     Social History:  Social History   Socioeconomic History   Marital status: Married    Spouse name: Not on file   Number of children: 1   Years of education: Not on file   Highest education level: Associate degree: academic program  Occupational History   Occupation: Accounting  Tobacco Use   Smoking status: Never    Passive exposure: Never   Smokeless tobacco: Never  Vaping Use   Vaping Use: Never used  Substance and Sexual Activity   Alcohol use: Not Currently   Drug use: Never   Sexual activity: Yes    Partners: Male    Birth control/protection: Other-see comments, Condom    Comment: husband is infertile  Other Topics Concern   Not on file  Social History Narrative   Not on file   Social Determinants of Health   Financial Resource Strain: Low Risk  (05/12/2022)   Overall Financial Resource Strain (CARDIA)    Difficulty of Paying Living Expenses: Not hard at all  Food Insecurity: No Food Insecurity (05/12/2022)   Hunger Vital Sign    Worried About Running Out of Food in the Last Year: Never true    Ran Out of Food in the Last Year: Never true  Transportation Needs: No Transportation Needs (05/12/2022)   PRAPARE - Administrator, Civil Service (Medical): No    Lack of Transportation (Non-Medical): No  Physical Activity: Sufficiently Active (05/12/2022)   Exercise Vital Sign    Days of Exercise per Week: 7 days    Minutes of Exercise per Session: 50 min  Stress: No Stress  Concern Present (05/12/2022)   [employer] of Occupational Health - Occupational Stress Questionnaire    Feeling of Stress : Not at all  Social Connections: Moderately Integrated (05/12/2022)   Social Connection and Isolation Panel [NHANES]    Frequency of Communication with Friends and Family: More than three times a week    Frequency of Social Gatherings with Friends and Family: More than three times a week    Attends Religious Services: More than 4 times per year    Active Member of [employer] or Organizations: No    Attends Engineer, structural: Not on file    Marital Status: Married  Catering manager Violence: Not on file   Family History:  Family History  Problem Relation Age of Onset   Thyroid disease Mother    Heart attack Father 58  Hypertension Father    Healthy Brother    Healthy Paternal Grandfather    Healthy Daughter     Review of Systems: Constitutional: Doesn't report fevers, chills or abnormal weight loss Eyes: Doesn't report blurriness of vision Ears, nose, mouth, throat, and face: Doesn't report sore throat Respiratory: Doesn't report cough, dyspnea or wheezes Cardiovascular: Doesn't report palpitation, chest discomfort  Gastrointestinal:  Doesn't report nausea, constipation, diarrhea GU: Doesn't report incontinence Skin: Doesn't report skin rashes Neurological: Per HPI Musculoskeletal: Doesn't report joint pain Behavioral/Psych: Doesn't report anxiety  Physical Exam: Vitals:   08/06/22 1203  BP: (!) 142/94  Pulse: 70  Resp: 15  Temp: 97.9 F (36.6 C)  SpO2: 98%     KPS: 90. General: Alert, cooperative, pleasant, in no acute distress Head: Normal EENT: No conjunctival injection or scleral icterus.  Lungs: Resp effort normal Cardiac: Regular rate Abdomen: Non-distended abdomen Skin: No rashes cyanosis or petechiae. Extremities: No clubbing or edema  Neurologic Exam: Mental Status: Awake, alert, attentive to examiner. Oriented to  self and environment. Language is fluent with intact comprehension. Abulic features noted. Cranial Nerves: Visual acuity is grossly normal. Visual fields are full. Extra-ocular movements intact. No ptosis. Face is symmetric Motor: Tone and bulk are normal. Power is full in both arms and legs. Reflexes are symmetric, no pathologic reflexes present.  Sensory: Intact to light touch Gait: Normal.   Labs: I have reviewed the data as listed    Component Value Date/Time   NA 138 08/06/2022 1127   K 3.7 08/06/2022 1127   CL 107 08/06/2022 1127   CO2 27 08/06/2022 1127   GLUCOSE 82 08/06/2022 1127   BUN 11 08/06/2022 1127   CREATININE 0.53 08/06/2022 1127   CREATININE 0.50 11/24/2019 1449   CALCIUM 8.8 (L) 08/06/2022 1127   PROT 7.3 08/06/2022 1127   ALBUMIN 4.2 08/06/2022 1127   AST 16 08/06/2022 1127   ALT 14 08/06/2022 1127   ALKPHOS 50 08/06/2022 1127   BILITOT 0.9 08/06/2022 1127   GFRNONAA >60 08/06/2022 1127   GFRNONAA 119 11/24/2019 1449   GFRAA 138 11/24/2019 1449   Lab Results  Component Value Date   WBC 2.9 (L) 08/06/2022   NEUTROABS 1.8 08/06/2022   HGB 14.1 08/06/2022   HCT 39.1 08/06/2022   MCV 87.7 08/06/2022   PLT 162 08/06/2022    Imaging:    Assessment/Plan 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HCC)  Joanne Jackson is clinically stable today, now having cycle #11 adjuvant Temodar.  She did experience a breakthrough seizure.  MRI brain demonstrates new 4mm nodule periventricular.  We are still awaiting formal images.  She will be out of the country for 6 weeks starting July 9th.  We recommended continuing treatment with cycle #12 Temozolomide 150 mg/m2, on for five days and off for twenty three days in twenty eight day cycles. The patient will have a complete blood count performed on days 21 and 28 of each cycle, and a comprehensive metabolic panel performed on day 28 of each cycle. Labs may need to be performed more often. Zofran will prescribed  for home use for nausea/vomiting.   Chemotherapy should be held for the following:  ANC less than 1,000  Platelets less than 100,000  LFT or creatinine greater than 2x ULN  If clinical concerns/contraindications develop  May con't trazodone to 50mg  HS, also on Lunesta 2mg .    Keppra will increase to 750mg  BID.  We appreciate the opportunity to participate in the  care of [employer].   We ask that Joanne Jackson return to clinic following her trip to [place], or sooner if needed.  All questions were answered. The patient knows to call the clinic with any problems, questions or concerns. No barriers to learning were detected.  The total time spent in the encounter was 40 minutes and more than 50% was on counseling and review of test results   Henreitta Leber, MD Medical Director of Neuro-Oncology [facility] at [facility] 08/06/22 1:52 PM

## 2022-08-08 ENCOUNTER — Encounter: Payer: Self-pay | Admitting: Internal Medicine

## 2022-08-15 ENCOUNTER — Ambulatory Visit: Payer: 59 | Admitting: Rheumatology

## 2022-09-18 NOTE — Progress Notes (Signed)
Office Visit Note  Patient: Joanne Jackson             Date of Birth: 08-13-1975           MRN: [id number]             PCP: Willow Ora, MD Referring: Willow Ora, MD Visit Date: 10/02/2022 Occupation: @GUAROCC @  Subjective:  Dry mouth and raynauds phenomenon  History of Present Illness: Joanne Jackson is a 47 y.o. female with Sjogren's.  She returns today after last visit on February 08, 2022.  Patient states she went to [place] and just came back yesterday.  She has been doing quite well.  She had a round of chemotherapy in May.  She continues to have dry mouth and dry eye symptoms.  She has been using Restasis.  Patient states that the paresthesias in her hands have resolved.  She also does not have much knee joint discomfort now.  She also uses some over-the-counter products.  She denies any shortness of breath.  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  She had surgery followed by chemotherapy and radiation therapy.  She is followed by Dr. Maple Hudson for obstructive sleep apnea.    Activities of Daily Living:  Patient reports morning stiffness for 1 minute.   Patient Denies nocturnal pain.  Difficulty dressing/grooming: Denies Difficulty climbing stairs: Denies Difficulty getting out of chair: Denies Difficulty using hands for taps, buttons, cutlery, and/or writing: Denies  Review of Systems  Constitutional:  Positive for fatigue.  HENT:  Positive for mouth dryness. Negative for mouth sores.   Eyes:  Positive for dryness.  Respiratory:  Negative for shortness of breath.   Cardiovascular:  Negative for chest pain and palpitations.  Gastrointestinal:  Negative for blood in stool, constipation and diarrhea.  Endocrine: Negative for increased urination.  Genitourinary:  Negative for involuntary urination.  Musculoskeletal:  Positive for morning stiffness. Negative for joint pain, gait problem, joint pain, joint swelling, myalgias, muscle weakness, muscle  tenderness and myalgias.  Skin:  Negative for color change, rash, hair loss and sensitivity to sunlight.  Allergic/Immunologic: Negative for susceptible to infections.  Neurological:  Negative for dizziness and headaches.  Hematological:  Negative for swollen glands.  Psychiatric/Behavioral:  Negative for depressed mood and sleep disturbance. The patient is not nervous/anxious.     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) 04/30/2021   Episode of transient neurologic symptoms 04/28/2021   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HCC) 10/25/2020   History of gestational diabetes 10/25/2020   Iron deficiency anemia 12/03/2019   Tubular adenoma of colon 10/24/2018   Essential hypertension 12/26/2017   Prediabetes 12/26/2017   Intramural leiomyoma of uterus 07/29/2015    Past Medical History:  Diagnosis Date   Brain cancer (HCC)    Glioma (HCC) 05/18/2021   Hypertension    Pre-diabetes    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HCC)     Family History  Problem Relation Age of Onset   Thyroid disease Mother    Heart attack Father 35   Hypertension Father    Healthy Brother    Healthy Paternal Grandfather    Healthy Daughter    Past Surgical History:  Procedure Laterality Date   BRAIN SURGERY  05/2021   tumor removal  CESAREAN SECTION     DILATION AND CURETTAGE, DIAGNOSTIC / THERAPEUTIC     Social History   Social History Narrative   Not on file   Immunization History  Administered Date(s) Administered   Influenza,inj,Quad PF,6+ Mos 11/11/2015, 10/24/2018, 12/02/2019, 10/25/2020, 11/14/2021   Moderna Sars-Covid-2 Vaccination 05/29/2019, 06/19/2019   PFIZER(Purple Top)SARS-COV-2 Vaccination 01/19/2020   Tdap 10/14/2015     Objective: Vital Signs: BP  124/87 (BP Location: Left Arm, Patient Position: Sitting, Cuff Size: Normal)   Pulse 73   Resp 14   Ht 5' (1.524 m)   Wt 157 lb (71.2 kg)   LMP 08/08/2022   BMI 30.66 kg/m    Physical Exam Vitals and nursing note reviewed.  Constitutional:      Appearance: She is well-developed.  HENT:     Head: Normocephalic and atraumatic.  Eyes:     Conjunctiva/sclera: Conjunctivae normal.  Cardiovascular:     Rate and Rhythm: Normal rate and regular rhythm.     Heart sounds: Normal heart sounds.  Pulmonary:     Effort: Pulmonary effort is normal.     Breath sounds: Normal breath sounds.  Abdominal:     General: Bowel sounds are normal.     Palpations: Abdomen is soft.  Musculoskeletal:     Cervical back: Normal range of motion.  Lymphadenopathy:     Cervical: No cervical adenopathy.  Skin:    General: Skin is warm and dry.     Capillary Refill: Capillary refill takes less than 2 seconds.  Neurological:     Mental Status: She is alert and oriented to person, place, and time.  Psychiatric:        Behavior: Behavior normal.      Musculoskeletal Exam: Cervical, thoracic and lumbar spine 1 good range of motion.  Shoulder joints, elbow joints, wrist joints, MCPs PIPs and DIPs with good range of motion with no synovitis.  Hip joints and knee joints with good range of motion.  No warmth swelling or effusion was noted.  She had no tenderness over ankles or MTPs.  CDAI Exam: CDAI Score: -- Patient Global: --; Provider Global: -- Swollen: --; Tender: -- Joint Exam 10/02/2022   No joint exam has been documented for this visit   There is currently no information documented on the homunculus. Go to the Rheumatology activity and complete the homunculus joint exam.  Investigation: No additional findings.  Imaging: No results found.  Recent Labs: Lab Results  Component Value Date   WBC 2.9 (L) 08/06/2022   HGB 14.1 08/06/2022   PLT 162 08/06/2022   NA 138 08/06/2022   K 3.7  08/06/2022   CL 107 08/06/2022   CO2 27 08/06/2022   GLUCOSE 82 08/06/2022   BUN 11 08/06/2022   CREATININE 0.53 08/06/2022   BILITOT 0.9 08/06/2022   ALKPHOS 50 08/06/2022   AST 16 08/06/2022   ALT 14 08/06/2022   PROT 7.3 08/06/2022   ALBUMIN 4.2 08/06/2022   CALCIUM 8.8 (L) 08/06/2022   GFRAA 138 11/24/2019    Speciality Comments: No specialty comments available.  Procedures:  No procedures performed Allergies: Patient has no known allergies.   Assessment / Plan:     Visit Diagnoses: 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HCC) - ANA 1:160 NS, RF<14, sed rate 26, Ro+: -She continues to have dry mouth and dry eyes.  Over-the-counter products were discussed.  She is also using Restasis eyedrops.  Increased risk of ILD and lymphoma with Sjogren's was discussed.  Patient  denies any shortness of breath.  No lymphadenopathy was noted.  She had no parotid swelling.  There is no history of oral ulcers, nasal ulcers, malar rash, photosensitivity or Raynaud's phenomenon.  I will obtain labs today.  Plan: Urinalysis, Routine w reflex microscopic, Anti-DNA antibody, double-stranded, C3 and C4, Sedimentation rate, Sjogrens syndrome-A extractable nuclear antibody.  She was having paresthesias in her hands at the last visit which is resolved.  Chronic pain of both knees - X-rays of both knee joints were unremarkable on 11/25/18.  Knee joint pain has improved.  Other fatigue -she continues to have some fatigue.  Plan: CBC with Differential/Platelet, COMPLETE METABOLIC PANEL WITH GFR  Moderate obstructive sleep apnea - she is followed by Dr. Maple Hudson.  Vitamin D deficiency -she has been taking vitamin D supplement.  Plan: VITAMIN D 25 Hydroxy (Vit-D Deficiency, Fractures)  Prediabetes  Essential hypertension-blood pressure was normal at 124/87 today.  Tubular adenoma of colon  Astrocytoma brain tumor [facility]) - Diagnosed in March 2023.  She had surgery at [facility] followed by chemotherapy  and radiation therapy.  She is still followed at [facility].  Orders: Orders Placed This Encounter  Procedures   CBC with Differential/Platelet   COMPLETE METABOLIC PANEL WITH GFR   Urinalysis, Routine w reflex microscopic   Anti-DNA antibody, double-stranded   C3 and C4   Sedimentation rate   Sjogrens syndrome-A extractable nuclear antibody   VITAMIN D 25 Hydroxy (Vit-D Deficiency, Fractures)   No orders of the defined types were placed in this encounter.    Follow-Up Instructions: Return in about 1 year (around 10/02/2023) for Sjogren's.   Pollyann Savoy, MD  Note - This record has been created using Animal nutritionist.  Chart creation errors have been sought, but may not always  have been located. Such creation errors do not reflect on  the standard of medical care.

## 2022-10-02 ENCOUNTER — Ambulatory Visit: Payer: 59 | Attending: Rheumatology | Admitting: Rheumatology

## 2022-10-02 ENCOUNTER — Encounter: Payer: Self-pay | Admitting: Rheumatology

## 2022-10-02 VITALS — BP 124/87 | HR 73 | Resp 14 | Ht 60.0 in | Wt 157.0 lb

## 2022-10-02 DIAGNOSIS — G4733 Obstructive sleep apnea (adult) (pediatric): Secondary | ICD-10-CM

## 2022-10-02 DIAGNOSIS — R5383 Other fatigue: Secondary | ICD-10-CM

## 2022-10-02 DIAGNOSIS — M25561 Pain in right knee: Secondary | ICD-10-CM | POA: Diagnosis not present

## 2022-10-02 DIAGNOSIS — M3501 Sicca syndrome with keratoconjunctivitis: Secondary | ICD-10-CM

## 2022-10-02 DIAGNOSIS — D126 Benign neoplasm of colon, unspecified: Secondary | ICD-10-CM

## 2022-10-02 DIAGNOSIS — E559 Vitamin D deficiency, unspecified: Secondary | ICD-10-CM

## 2022-10-02 DIAGNOSIS — R202 Paresthesia of skin: Secondary | ICD-10-CM

## 2022-10-02 DIAGNOSIS — M25562 Pain in left knee: Secondary | ICD-10-CM

## 2022-10-02 DIAGNOSIS — I10 Essential (primary) hypertension: Secondary | ICD-10-CM

## 2022-10-02 DIAGNOSIS — C719 Malignant neoplasm of brain, unspecified: Secondary | ICD-10-CM

## 2022-10-02 DIAGNOSIS — R7303 Prediabetes: Secondary | ICD-10-CM

## 2022-10-02 DIAGNOSIS — G8929 Other chronic pain: Secondary | ICD-10-CM

## 2022-10-03 ENCOUNTER — Other Ambulatory Visit: Payer: Self-pay

## 2022-10-03 ENCOUNTER — Encounter: Payer: Self-pay | Admitting: Internal Medicine

## 2022-10-03 LAB — COMPLETE METABOLIC PANEL WITH GFR
AG Ratio: 1.8 (calc) (ref 1.0–2.5)
ALT: 48 U/L — ABNORMAL HIGH (ref 6–29)
AST: 35 U/L (ref 10–35)
Albumin: 4.5 g/dL (ref 3.6–5.1)
Alkaline phosphatase (APISO): 55 U/L (ref 31–125)
BUN: 13 mg/dL (ref 7–25)
CO2: 25 mmol/L (ref 20–32)
Calcium: 9.1 mg/dL (ref 8.6–10.2)
Chloride: 106 mmol/L (ref 98–110)
Creat: 0.54 mg/dL (ref 0.50–0.99)
Globulin: 2.5 g/dL (ref 1.9–3.7)
Glucose, Bld: 81 mg/dL (ref 65–99)
Potassium: 4 mmol/L (ref 3.5–5.3)
Sodium: 138 mmol/L (ref 135–146)
Total Bilirubin: 0.9 mg/dL (ref 0.2–1.2)
Total Protein: 7 g/dL (ref 6.1–8.1)
eGFR: 116 mL/min/{1.73_m2} (ref >=60)

## 2022-10-03 LAB — URINALYSIS, ROUTINE W REFLEX MICROSCOPIC
Bilirubin Urine: NEGATIVE
Glucose, UA: NEGATIVE
Hgb urine dipstick: NEGATIVE
Ketones, ur: NEGATIVE
Leukocytes,Ua: NEGATIVE
Nitrite: NEGATIVE
Protein, ur: NEGATIVE
Specific Gravity, Urine: 1.02 (ref 1.001–1.035)
pH: 8 (ref 5.0–8.0)

## 2022-10-03 LAB — CBC WITH DIFFERENTIAL/PLATELET
Absolute Monocytes: 439 {cells}/uL (ref 200–950)
Basophils Absolute: 10 {cells}/uL (ref 0–200)
Basophils Relative: 0.3 %
Eosinophils Absolute: 31 {cells}/uL (ref 15–500)
Eosinophils Relative: 0.9 %
HCT: 39.4 % (ref 35.0–45.0)
Hemoglobin: 13.4 g/dL (ref 11.7–15.5)
Lymphs Abs: 687 {cells}/uL — ABNORMAL LOW (ref 850–3900)
MCH: 30.8 pg (ref 27.0–33.0)
MCHC: 34 g/dL (ref 32.0–36.0)
MCV: 90.6 fL (ref 80.0–100.0)
MPV: 9.4 fL (ref 7.5–12.5)
Monocytes Relative: 12.9 %
Neutro Abs: 2234 {cells}/uL (ref 1500–7800)
Neutrophils Relative %: 65.7 %
Platelets: 186 10*3/uL (ref 140–400)
RBC: 4.35 10*6/uL (ref 3.80–5.10)
RDW: 12.5 % (ref 11.0–15.0)
Total Lymphocyte: 20.2 %
WBC: 3.4 10*3/uL — ABNORMAL LOW (ref 3.8–10.8)

## 2022-10-03 LAB — SJOGRENS SYNDROME-A EXTRACTABLE NUCLEAR ANTIBODY: SSA (Ro) (ENA) Antibody, IgG: 7.6 AI — AB

## 2022-10-03 LAB — SEDIMENTATION RATE: Sed Rate: 17 mm/h (ref 0–20)

## 2022-10-03 LAB — C3 AND C4
C3 Complement: 142 mg/dL (ref 83–193)
C4 Complement: 18 mg/dL (ref 15–57)

## 2022-10-03 LAB — VITAMIN D 25 HYDROXY (VIT D DEFICIENCY, FRACTURES): Vit D, 25-Hydroxy: 31 ng/mL (ref 30–100)

## 2022-10-03 LAB — ANTI-DNA ANTIBODY, DOUBLE-STRANDED: ds DNA Ab: <1 [IU]/mL

## 2022-10-04 NOTE — Progress Notes (Signed)
White count is low and stable.  Lymphocyte count is low due to chemotherapy.  CMP shows mildly elevated liver functions.  UA is negative.  Sjogren's antibody SSA continues to be positive.  Double-stranded DNA negative, complements normal, sed rate normal, vitamin D low normal.  Patient should take vitamin D 2000 units daily.  Please forward results to her PCP.  Labs do not indicate an autoimmune disease flare.

## 2022-10-31 ENCOUNTER — Other Ambulatory Visit: Payer: Self-pay | Admitting: Internal Medicine

## 2022-11-05 ENCOUNTER — Emergency Department (HOSPITAL_COMMUNITY)
Admission: EM | Admit: 2022-11-05 | Discharge: 2022-11-05 | Disposition: A | Discharge status: Home / Self Care | Payer: 59

## 2022-11-05 ENCOUNTER — Emergency Department (HOSPITAL_COMMUNITY): Payer: 59

## 2022-11-05 ENCOUNTER — Other Ambulatory Visit: Payer: Self-pay

## 2022-11-05 ENCOUNTER — Encounter (HOSPITAL_COMMUNITY): Payer: Self-pay

## 2022-11-05 DIAGNOSIS — R200 Anesthesia of skin: Secondary | ICD-10-CM | POA: Diagnosis present

## 2022-11-05 DIAGNOSIS — R531 Weakness: Secondary | ICD-10-CM | POA: Insufficient documentation

## 2022-11-05 DIAGNOSIS — Z85841 Personal history of malignant neoplasm of brain: Secondary | ICD-10-CM | POA: Diagnosis not present

## 2022-11-05 LAB — DIFFERENTIAL
Abs Immature Granulocytes: 0.01 10*3/uL (ref 0.00–0.07)
Basophils Absolute: 0 10*3/uL (ref 0.0–0.1)
Basophils Relative: 0 %
Eosinophils Absolute: 0 10*3/uL (ref 0.0–0.5)
Eosinophils Relative: 1 %
Immature Granulocytes: 0 %
Lymphocytes Relative: 24 %
Lymphs Abs: 0.8 10*3/uL (ref 0.7–4.0)
Monocytes Absolute: 0.3 10*3/uL (ref 0.1–1.0)
Monocytes Relative: 9 %
Neutro Abs: 2.2 10*3/uL (ref 1.7–7.7)
Neutrophils Relative %: 66 %

## 2022-11-05 LAB — CBC
HCT: 35.9 % — ABNORMAL LOW (ref 36.0–46.0)
Hemoglobin: 12.4 g/dL (ref 12.0–15.0)
MCH: 30.9 pg (ref 26.0–34.0)
MCHC: 34.5 g/dL (ref 30.0–36.0)
MCV: 89.5 fL (ref 80.0–100.0)
Platelets: 155 10*3/uL (ref 150–400)
RBC: 4.01 MIL/uL (ref 3.87–5.11)
RDW: 11.9 % (ref 11.5–15.5)
WBC: 3.4 10*3/uL — ABNORMAL LOW (ref 4.0–10.5)
nRBC: 0 % (ref 0.0–0.2)

## 2022-11-05 LAB — URINALYSIS, ROUTINE W REFLEX MICROSCOPIC
Bilirubin Urine: NEGATIVE
Glucose, UA: NEGATIVE mg/dL
Hgb urine dipstick: NEGATIVE
Ketones, ur: NEGATIVE mg/dL
Leukocytes,Ua: NEGATIVE
Nitrite: NEGATIVE
Protein, ur: NEGATIVE mg/dL
Specific Gravity, Urine: 1.016 (ref 1.005–1.030)
pH: 7 (ref 5.0–8.0)

## 2022-11-05 LAB — HCG, SERUM, QUALITATIVE: Preg, Serum: NEGATIVE

## 2022-11-05 LAB — COMPREHENSIVE METABOLIC PANEL
ALT: 25 U/L (ref 0–44)
AST: 26 U/L (ref 15–41)
Albumin: 3.7 g/dL (ref 3.5–5.0)
Alkaline Phosphatase: 45 U/L (ref 38–126)
Anion gap: 7 (ref 5–15)
BUN: 13 mg/dL (ref 6–20)
CO2: 25 mmol/L (ref 22–32)
Calcium: 8.8 mg/dL — ABNORMAL LOW (ref 8.9–10.3)
Chloride: 102 mmol/L (ref 98–111)
Creatinine, Ser: 0.62 mg/dL (ref 0.44–1.00)
GFR, Estimated: >60 mL/min (ref >60)
Glucose, Bld: 104 mg/dL — ABNORMAL HIGH (ref 70–99)
Potassium: 3.9 mmol/L (ref 3.5–5.1)
Sodium: 134 mmol/L — ABNORMAL LOW (ref 135–145)
Total Bilirubin: 0.9 mg/dL (ref 0.3–1.2)
Total Protein: 6.7 g/dL (ref 6.5–8.1)

## 2022-11-05 LAB — I-STAT CHEM 8, ED
BUN: 12 mg/dL (ref 6–20)
Calcium, Ion: 1.24 mmol/L (ref 1.15–1.40)
Chloride: 104 mmol/L (ref 98–111)
Creatinine, Ser: 0.6 mg/dL (ref 0.44–1.00)
Glucose, Bld: 104 mg/dL — ABNORMAL HIGH (ref 70–99)
HCT: 39 % (ref 36.0–46.0)
Hemoglobin: 13.3 g/dL (ref 12.0–15.0)
Potassium: 3.8 mmol/L (ref 3.5–5.1)
Sodium: 138 mmol/L (ref 135–145)
TCO2: 23 mmol/L (ref 22–32)

## 2022-11-05 LAB — RAPID URINE DRUG SCREEN, HOSP PERFORMED
Amphetamines: NOT DETECTED
Barbiturates: NOT DETECTED
Benzodiazepines: NOT DETECTED
Cocaine: NOT DETECTED
Opiates: NOT DETECTED
Tetrahydrocannabinol: NOT DETECTED

## 2022-11-05 LAB — CBG MONITORING, ED: Glucose-Capillary: 115 mg/dL — ABNORMAL HIGH (ref 70–99)

## 2022-11-05 LAB — PROTIME-INR
INR: 0.9 (ref 0.8–1.2)
Prothrombin Time: 12.4 s (ref 11.4–15.2)

## 2022-11-05 LAB — ETHANOL: Alcohol, Ethyl (B): <10 mg/dL (ref <10)

## 2022-11-05 LAB — APTT: aPTT: 31 s (ref 24–36)

## 2022-11-05 MED ORDER — LEVETIRACETAM 1000 MG PO TABS: 1000.0000 mg | ORAL_TABLET | Freq: Two times a day (BID) | ORAL | 0 refills | Status: DC

## 2022-11-05 MED ORDER — LEVETIRACETAM 500 MG PO TABS
500.0000 mg | ORAL_TABLET | Freq: Once | ORAL | Status: AC
  Administered 2022-11-05: 500 mg via ORAL
  Filled 2022-11-05: qty 1

## 2022-11-05 MED ORDER — LEVETIRACETAM 750 MG PO TABS: 750.0000 mg | ORAL_TABLET | Freq: Two times a day (BID) | ORAL | 3 refills | Status: DC

## 2022-11-05 MED ORDER — IOHEXOL 350 MG/ML SOLN
75.0000 mL | Freq: Once | INTRAVENOUS | Status: AC | PRN
  Administered 2022-11-05: 75 mL via INTRAVENOUS

## 2022-11-05 NOTE — ED Triage Notes (Addendum)
Patient has had left face, arm, leg numbness for the past 50 minutes. Was unable to feel fingers on left side. Stated it resolved. Dr. Rhae Hammock at bedside in triage.

## 2022-11-05 NOTE — ED Notes (Signed)
Neuro assessment completed by Dr Amada Jupiter. He will speak with Dr Rhae Hammock.

## 2022-11-05 NOTE — ED Notes (Addendum)
1709 Code Stroke cart activated, LKW 1600, R side weakness. H/ high grade glioma with tumor removal 05/2021, seizure, HTN. BP 149/99, glucose 115 1711 Dr Amada Jupiter paged 1717 pt to CT-cart taken to CT with pt 1718 Dr Amada Jupiter on camera- pt examined in CT-not a TNK candidate h/o brain tumor  Joanne Montana, RN Telestroke nurse

## 2022-11-05 NOTE — Consult Note (Signed)
[facility] Telemedicine Consult   Requesting Provider: Rhae Hammock, A Consult Participants: Patient, nursing Location of the provider: [place], [place] Location of the patient: [facility]  This consult was provided via telemedicine with 2-way video and audio communication. The patient/family was informed that care would be provided in this way and agreed to receive care in this manner.    Chief Complaint: Left sided numbness  HPI: 47 yo F with a history of a high grade glioma resected in April 2023, who presents with left sided numbness. She was in her normal state of health earlier today when she had transient pins and needles on the right side. A while later, she noticed that her left side became numb and weak and then gradually improved over the course of 30 min to an hour.   She is now back to baseline.  She has had the same thing happened five or six times in the past.  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  LKW: 4 pm tpa given?: No, h/o brain tumor IR Thrombectomy? No, no LVO Time of teleneurologist evaluation: 6:08 pm(there was delay due to the cart baterry dying, but she was already known to not be a thrombolytic or IR candidate due to imaging)  Exam: Vitals:   11/05/22 1656  BP: (!) 149/116  Pulse: 72  Resp: 19  Temp: 99.2 F (37.3 C)  SpO2: 100%    General: in bed, NAD  1A: Level of Consciousness - 0 1B: Ask Month and Age - 0 1C: 'Blink Eyes' & 'Squeeze Hands' - 0 2: Test Horizontal Extraocular Movements - 0 3: Test Visual Fields - 0 4: Test Facial Palsy - 0 5A: Test Left Arm Motor Drift - 0 5B: Test Right Arm Motor Drift - 0 6A: Test Left Leg Motor Drift - 0 6B: Test Right Leg Motor Drift - 0 7: Test Limb Ataxia - 0 8: Test Sensation - 0 9: Test Language/Aphasia- 0 10: Test Dysarthria - 0 11: Test Extinction/Inattention - 0 NIHSS score: 0   Imaging  Reviewed: CT/CTA- post surgical changed in the right frontal region  Labs reviewed in epic and pertinent values follow: Cr 0.62   Assessment: 47 year old female with a history of high-grade glioma status post resection with at least five, possibly more episodes of stereotyped left-sided weakness and numbness.  With recurrent stereotyped spells as well as the presence of positive symptoms, I think cerebral ischemia is significantly less likely and would favor treating this as a recurrent seizure.  I would encourage her to continue to return to care if the symptoms return.  Recommendations:  1) increase Keppra to 1 g twice daily 2) f/u with her outpatient neurologist  Ritta Slot, MD [facility] [phone]  If 7pm- 7am, please page neurology on call as listed in [facility]ION.

## 2022-11-05 NOTE — ED Provider Notes (Signed)
EMERGENCY DEPARTMENT AT [facility] Provider Note   CSN: [id number] Arrival date & time: 11/05/22  1645     History  Chief Complaint  Patient presents with   Numbness    Joanne Jackson is a 47 y.o. female.  47 year old female with past medical history of brain cancer with surgery as well as chemotherapy and radiation who is now cancer free presenting to the emergency department today with left-sided numbness and weakness.  The patient states this began 50 minutes prior to arrival.  She denies any significant headaches with this.  Denies any chest pain.  She states he was having weakness and numbness in the left side of her face as well as the left upper and lower extremities.  She reports that this is improving and she is still having some numbness but denies any significant weakness currently.  She is not on any blood thinners.  Her last surgery was 1 year ago.  She is on Keppra for seizures but there was no obvious seizure activity noted by the patient's family today.  She was sent to the ER for further evaluation regarding this.        Home Medications Prior to Admission medications   Medication Sig Start Date End Date Taking? Authorizing Provider  levETIRAcetam (KEPPRA) 1000 MG tablet Take 1 tablet (1,000 mg total) by mouth 2 (two) times daily. 11/05/22  Yes Durwin Glaze, MD  Cholecalciferol (VITAMIN D3) 50 MCG (2000 UT) TABS Take 2,000 Units by mouth daily as needed.     [provider]  dexamethasone (DECADRON) 4 MG tablet Take 4 mg by mouth See admin instructions. Take 1 tablet (4 mg total) by mouth daily with breakfast / Take one tablet (4mg ) on the morning of airplane flight departure and one day after as directed 08/02/22   [provider]  docusate sodium (COLACE) 100 MG capsule Take 1 capsule (100 mg total) by mouth 2 (two) times daily. Patient taking differently: Take 100 mg by mouth as needed. 10/19/21   Henreitta Leber, MD  ferrous  sulfate 324 (65 Fe) MG TBEC TAKE 1 TABLET BY MOUTH EVERY DAY Patient taking differently: Take 324 mg by mouth daily. 09/06/20   Willow Ora, MD  levETIRAcetam (KEPPRA) 750 MG tablet Take 1 tablet (750 mg total) by mouth 2 (two) times daily. 11/05/22   Durwin Glaze, MD  lisinopril (ZESTRIL) 10 MG tablet TAKE 1 TABLET BY MOUTH EVERY DAY 06/19/22   Willow Ora, MD  LORazepam (ATIVAN) 2 MG tablet Take 1 tablet (2 mg total) by mouth every 6 (six) hours as needed for seizure. 08/06/22   Vaslow, Georgeanna Lea, MD  melatonin 3 MG TABS tablet Take 1 tablet (3 mg total) by mouth at bedtime. 06/28/22   Henreitta Leber, MD  mirtazapine (REMERON) 7.5 MG tablet Take 1 tablet (7.5 mg total) by mouth at bedtime. 01/25/22   Henreitta Leber, MD  omeprazole (PRILOSEC) 20 MG capsule Take 20 mg by mouth daily. 09/11/21   [provider]  ondansetron (ZOFRAN) 8 MG tablet TAKE 1 TABLET BY MOUTH 2 TIMES DAILY AS NEEDED FOR NAUSEA AND VOMITING. MAY TAKE 30-60 MINUTES PRIOR TO TEMODAR ADMINISTRATION IF NAUSEA/VOMITING OCCURS. 03/29/22   Vaslow, Georgeanna Lea, MD  polyethylene glycol powder (GLYCOLAX/MIRALAX) 17 GM/SCOOP powder Take by mouth. 05/10/22   [provider]  RESTASIS 0.05 % ophthalmic emulsion Place 1 drop into both eyes 2 (two) times daily. 01/25/20   [provider]  traZODone (DESYREL) 50 MG tablet TAKE 1 TABLET BY MOUTH EVERYDAY AT BEDTIME 07/23/22   Vaslow, Georgeanna Lea, MD      Allergies    Patient has no known allergies.    Review of Systems   Review of Systems  Neurological:  Positive for weakness and numbness.  All other systems reviewed and are negative.   Physical Exam Updated Vital Signs BP (!) 146/90   Pulse 61   Temp 98.8 F (37.1 C) (Oral)   Resp 17   Ht 5' (1.524 m)   Wt 72 kg   SpO2 97%   BMI 31.00 kg/m  Physical Exam Vitals and nursing note reviewed.   Gen: NAD Eyes: PERRL, EOMI HEENT: no oropharyngeal swelling Neck: trachea midline Resp: clear to  auscultation bilaterally Card: RRR, no murmurs, rubs, or gallops Abd: nontender, nondistended Extremities: no calf tenderness, no edema Vascular: 2+ radial pulses bilaterally, 2+ DP pulses bilaterally NIH stroke scale of 1 for diminished sensation on the left side Skin: no rashes Psyc: acting appropriately   ED Results / Procedures / Treatments   Labs (all labs ordered are listed, but only abnormal results are displayed) Labs Reviewed  CBC - Abnormal; Notable for the following components:      Result Value   WBC 3.4 (*)    HCT 35.9 (*)    All other components within normal limits  COMPREHENSIVE METABOLIC PANEL - Abnormal; Notable for the following components:   Sodium 134 (*)    Glucose, Bld 104 (*)    Calcium 8.8 (*)    All other components within normal limits  CBG MONITORING, ED - Abnormal; Notable for the following components:   Glucose-Capillary 115 (*)    All other components within normal limits  I-STAT CHEM 8, ED - Abnormal; Notable for the following components:   Glucose, Bld 104 (*)    All other components within normal limits  DIFFERENTIAL  RAPID URINE DRUG SCREEN, HOSP PERFORMED  URINALYSIS, ROUTINE W REFLEX MICROSCOPIC  ETHANOL  PROTIME-INR  APTT  HCG, SERUM, QUALITATIVE    EKG None  Radiology CT ANGIO HEAD NECK W WO CM (CODE STROKE)  Result Date: 11/05/2022 CLINICAL DATA:  Provided history: Neuro deficit, acute, stroke suspected. Left face, arm and leg numbness. EXAM: CT ANGIOGRAPHY HEAD AND NECK WITH AND WITHOUT CONTRAST TECHNIQUE: Multidetector CT imaging of the head and neck was performed using the standard protocol during bolus administration of intravenous contrast. Multiplanar CT image reconstructions and MIPs were obtained to evaluate the vascular anatomy. Carotid stenosis measurements (when applicable) are obtained utilizing NASCET criteria, using the distal internal carotid diameter as the denominator. RADIATION DOSE REDUCTION: This exam was  performed according to the departmental dose-optimization program which includes automated exposure control, adjustment of the mA and/or kV according to patient size and/or use of iterative reconstruction technique. CONTRAST:  75mL OMNIPAQUE IOHEXOL 350 MG/ML SOLN COMPARISON:  Non-contrast head CT performed earlier today 11/05/2022. FINDINGS: CTA NECK FINDINGS Aortic arch: Standard aortic branching. The visualized thoracic aorta is normal in caliber. Mild atherosclerotic plaque within the proximal left subclavian artery. Streak/beam hardening artifact arising from a dense right-sided contrast bolus partially obscures the right subclavian artery. Within this limitation, there is no appreciable hemodynamically significant innominate or proximal subclavian artery stenosis. Right carotid system: CCA and ICA patent within the neck without stenosis. Minimal atherosclerotic plaque about the carotid bifurcation. Left carotid system: CCA and ICA patent within the neck without stenosis. Minimal atherosclerotic plaque about the  carotid bifurcation. Tortuosity of the cervical ICA. Vertebral arteries: Codominant and patent within the neck without stenosis or significant atherosclerotic disease. Skeleton: Cervical spondylosis. No acute fracture or aggressive osseous lesion. Other neck: A 1.3 cm nodule is questioned within the right thyroid lobe (versus beam hardening artifact). This nodule would not meet consensus criteria for ultrasound follow-up based on size. No follow-up imaging recommended. Reference: J Am Coll Radiol. 2015 Feb;12(2): 143-50. Upper chest: No consolidation within the imaged lung apices. Review of the MIP images confirms the above findings CTA HEAD FINDINGS Anterior circulation: The intracranial internal carotid arteries are patent. Minimal non-stenotic atherosclerotic plaque within both vessels. Atherosclerotic irregularity of the M2 and more distal MCA vessels bilaterally. No M2 proximal branch occlusion or  high-grade proximal stenosis is identified. The anterior cerebral arteries are patent. No intracranial aneurysm is identified. Posterior circulation: The intracranial vertebral arteries are patent. The basilar artery is patent. The posterior cerebral arteries are patent. The right PCA is predominantly (or entirely) fetal in origin. A left posterior communicating artery is present. Venous sinuses: Within the limitations of contrast timing, no convincing thrombus. Anatomic variants: As described. Review of the MIP images confirms the above findings No emergent proximal large vessel occlusion identified. These results were called by telephone at the time of interpretation on 11/05/2022 at 5:46 pm to provider [facility] , who verbally acknowledged these results. IMPRESSION: CTA neck: 1. The common carotid and internal carotid arteries are patent within the neck without stenosis. Minimal atherosclerotic plaque about the carotid bifurcations bilaterally. 2. Vertebral arteries patent within the neck without stenosis or significant atherosclerotic disease. CTA head: 1. No intracranial large vessel occlusion or proximal high-grade arterial stenosis identified. 2. Non-stenotic atherosclerotic plaque within the intracranial internal carotid arteries. 3. Atherosclerotic irregularity of the M2 and more distal MCA vessels, bilaterally. Electronically Signed   By: Jackey Loge D.O.   On: 11/05/2022 17:55   CT HEAD CODE STROKE WO CONTRAST  Result Date: 11/05/2022 CLINICAL DATA:  Code stroke. Neuro deficit, acute, stroke suspected. Additional history provided: Numbness in left face, arm and leg. 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 EXAM: CT HEAD WITHOUT CONTRAST TECHNIQUE: Contiguous axial images were obtained from the base of the skull through the vertex without intravenous contrast. RADIATION DOSE REDUCTION: This exam was performed according to the departmental  dose-optimization program which includes automated exposure control, adjustment of the mA and/or kV according to patient size and/or use of iterative reconstruction technique. COMPARISON:  Outside brain MRI 08/01/2022. FINDINGS: Brain: Redemonstrated right frontal lobe resection cavity (deep to a cranioplasty). Please note, reassessment of the patient's known primary CNS tumor is limited on this non-contrast head CT. Parenchymal hypodensity adjacent to the resection cavity may reflect post treatment changes and/or infiltrative tumor. There is no acute intracranial hemorrhage. No demarcated cortical infarct. No extra-axial fluid collection. No midline shift. Vascular: No hyperdense vessel.  Atherosclerotic calcifications Skull: Bifrontal and parietal cranioplasty Sinuses/Orbits: No orbital mass or acute orbital finding. Mild mucosal thickening within the right maxillary sinus. Minimal mucosal thickening within the left maxillary and left sphenoid sinuses. Mucosal thickening and fluid within left ethmoid air cells, overall moderate in severity. Minimal mucosal thickening within a hypoplastic right frontal sinus ASPECTS ([place]berta Stroke Program Early CT Score) - Ganglionic level infarction (caudate, lentiform nuclei, internal capsule, insula, M1-M3 cortex): 7 - Supraganglionic infarction (M4-M6 cortex): 3 Total score (0-10 with 10 being normal): 10 No acute intracranial hemorrhage or evidence of an acute  infarct. These results were called by telephone at the time of interpretation on 11/05/2022 at 5:34 pm to provider Kayn Haymore , who verbally acknowledged these results. IMPRESSION: 1. No evidence of acute intracranial hemorrhage or an acute infarct. 2. Redemonstrated resection cavity within the right frontal lobe. The patient's known primary CNS tumor is poorly reassessed on this non-contrast head CT. Parenchymal hypodensity adjacent to the resection cavity may reflect post treatment changes and/or infiltrative tumor. 3.  Paranasal sinus disease as described. Electronically Signed   By: Jackey Loge D.O.   On: 11/05/2022 17:35    Procedures Procedures    Medications Ordered in ED Medications  iohexol (OMNIPAQUE) 350 MG/ML injection 75 mL (75 mLs Intravenous Contrast Given 11/05/22 1723)  levETIRAcetam (KEPPRA) tablet 500 mg (500 mg Oral Given 11/05/22 1841)    ED Course/ Medical Decision Making/ A&P                                 Medical Decision Making 47 year old female with past medical history of brain cancer in remission as well as seizures in the past presenting to the emergency department today with weakness and numbness that started 50 minutes prior to arrival.  The patient is in the window for intervention but given her history of surgery she may not qualify.  Given that she is still having some symptoms I will initiate a code stroke.  Will discuss her case with neurology.  Her symptoms do seem to be improving significantly.  I will discuss her case with the stroke neurologist after her initial evaluation.  The patient's labs are unremarkable.  Her CT imaging was reassuring.  She was evaluated by Dr. Amada Jupiter.  He recommended given the patient additional dose of Keppra here and recommended going up on her dose to 1000 mg twice per day.  He did not feel that further admission or stroke workup was warranted.  She is discharged with return precautions.  Critical care time 38 minutes including reassessments, review of old records, coordination of care with neurology  Amount and/or Complexity of Data Reviewed Labs: ordered. Radiology: ordered.  Risk Prescription drug management.           Final Clinical Impression(s) / ED Diagnoses Final diagnoses:  Numbness  Weakness    Rx / DC Orders ED Discharge Orders          Ordered    levETIRAcetam (KEPPRA) 1000 MG tablet  2 times daily        11/05/22 2012    levETIRAcetam (KEPPRA) 750 MG tablet  2 times daily        11/05/22 2012               Durwin Glaze, MD 11/05/22 2012

## 2022-11-05 NOTE — Discharge Instructions (Addendum)
Your workup today was reassuring.  Please go up to 1000 mg of the Keppra twice daily.  Follow-up with your neurologist.  Return to the ER for worsening symptoms.

## 2022-11-07 ENCOUNTER — Encounter: Payer: Self-pay | Admitting: Neurology

## 2022-11-07 ENCOUNTER — Ambulatory Visit (INDEPENDENT_AMBULATORY_CARE_PROVIDER_SITE_OTHER): Payer: 59 | Admitting: Neurology

## 2022-11-07 ENCOUNTER — Telehealth: Payer: Self-pay | Admitting: Neurology

## 2022-11-07 VITALS — BP 112/72 | Ht 60.0 in | Wt 157.0 lb

## 2022-11-07 DIAGNOSIS — G40109 Localization-related (focal) (partial) symptomatic epilepsy and epileptic syndromes with simple partial seizures, not intractable, without status epilepticus: Secondary | ICD-10-CM | POA: Diagnosis not present

## 2022-11-07 DIAGNOSIS — Z9889 Other specified postprocedural states: Secondary | ICD-10-CM | POA: Insufficient documentation

## 2022-11-07 MED ORDER — LEVETIRACETAM 1000 MG PO TABS: 1000.0000 mg | ORAL_TABLET | Freq: Two times a day (BID) | ORAL | 3 refills | Status: DC

## 2022-11-07 NOTE — Telephone Encounter (Signed)
Patient with history of high-grade glioma, partial seizure, was treated at emergency room on November 05, 2022  I have added her to my clinic November 07, 2022 at 4: 15 PM, arriving at 4 PM, please call her to confirm the visit,

## 2022-11-07 NOTE — Telephone Encounter (Signed)
Patient called back and stated she would be here for appointment at 1:30 pm today

## 2022-11-07 NOTE — Progress Notes (Signed)
Chief Complaint  Patient presents with   Follow-up    Rm 13, NP sz status post brain surgery   New Patient (Initial Visit)      ASSESSMENT AND PLAN  Joanne Jackson is a 47 y.o. 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 Zachery Conch at [facility] in April 2023 followed by radiation and completed 12 cycles of 5-day Temodar in 28 cycles in June 2024.  DIAGNOSTIC DATA (LABS, IMAGING, TESTING) - I reviewed patient records, labs, notes, testing and imaging myself where available.   MEDICAL HISTORY:  Joanne Jackson is a 47 year old female, seen for partial seizure, her primary care physician is Dr.    Mardelle Matte, [facility],  History is obtained from the patient and review of electronic medical records. I personally reviewed pertinent available imaging films in PACS.   PMHx of   History of right frontal grade 3 glioma, status post right frontal craniotomy by [facility] neurosurgeon Dr. Zachery Conch in April 2023, supplementary motor syndrome postsurgically,was treated with Temozolomide with radiation, currently followed by [facility] oncologist and Dr. Elissa Hefty at [facility],  MRI of the brain with without contrast July 2023 showed collapse of resection cavity with mild linear enhancement surrounding the cavity,  Chemotherapy from August 15, 2021 to June 2024, 5 days temozolomide in 28-day cycle, completed 12 cycles  Multiple repeat MRI brain's, most recent 1 October 03, 2022, stable right frontal lobe resection cavity with subtle enhancement of posterior cavity, and separate stable focus of enhancement involving the ependymal surface of the anterior right lateral ventricle, no change compared to previous scan  Postsurgically, she has intermittent partial seizure, often presenting with left hand  numbness, clumsiness, sometimes spreading to involving her left face, occasionally left trunk, and the left lower extremity, there was no loss of consciousness, no chronic tonic-clonic activity  She was treated with Keppra large dose 1500 mg twice a day postsurgically, then dosage was gradually decreased to 500 mg twice daily, she had 2 recurrent seizure spells in June, dosage was increased to 750 mg twice a day, continue have recurrent seizure spells, most recent 1 November 05, 2022, presenting to emergency room, episode tends to happen around 4 to 5 PM, reports sudden onset left hand numbness, clumsiness, over the next 30 minutes, spreading to left arm, left face, left trunk, left lower extremity, no loss of consciousness, however episode last about 2 to 3 hours  CT angiogram head and neck September 30 showed no large vessel disease, intracranial atherosclerotic irregularity mainly involving bilateral M2  CT head showed resection cavity within the right frontal lobe, periventricular hypodensity  Laboratory showed normal INR, CMP, CBC with decreased which is at her baseline  She was seen by neurohospitalist Dr. Petra Kuba, Keppra dose was increased to 1000 mg twice a day, tolerating it well, no significant side effect, husband reported some personality change, but no significant moodiness or agitation  She is sleeping well with current trazodone 50 mg every night, Remeron 7.5 mg, plus melatonin 3 mg every night,  PHYSICAL EXAM:   Vitals:   11/07/22 1614  BP: 112/72  Weight: 157 lb (71.2 kg)  Height: 5' (1.524 m)    Body mass index is 30.66 kg/m.  PHYSICAL EXAMNIATION:  Gen: NAD, conversant, well nourised, well groomed  Cardiovascular: Regular rate rhythm, no peripheral edema, warm, nontender. Eyes: Conjunctivae clear without exudates or hemorrhage Neck: Supple, no carotid bruits. Pulmonary: Clear to auscultation bilaterally   NEUROLOGICAL EXAM:  MENTAL  STATUS: Speech/cognition: Awake, alert, oriented to history taking and casual conversation CRANIAL NERVES: CN II: Visual fields are full to confrontation. Pupils are round equal and briskly reactive to light. CN III, IV, VI: extraocular movement are normal. No ptosis. CN V: Facial sensation is intact to light touch CN VII: Face is symmetric with normal eye closure  CN VIII: Hearing is normal to causal conversation. CN IX, X: Phonation is normal. CN XI: Head turning and shoulder shrug are intact  MOTOR: Mild left arm pronation drift, fixation on rapid rotating movement  REFLEXES: Mildly hyperreflexia of left upper extremity  SENSORY: Intact to light touch, pinprick and vibratory sensation are intact in fingers and toes.  COORDINATION: There is no trunk or limb dysmetria noted.  GAIT/STANCE: Posture is normal. Gait is steady    REVIEW OF SYSTEMS:  Full 14 system review of systems performed and notable only for as above All other review of systems were negative.   ALLERGIES: No Known Allergies  HOME MEDICATIONS: Current Outpatient Medications  Medication Sig Dispense Refill   Cholecalciferol (VITAMIN D3) 50 MCG (2000 UT) TABS Take 2,000 Units by mouth daily as needed.      docusate sodium (COLACE) 100 MG capsule Take 1 capsule (100 mg total) by mouth 2 (two) times daily. (Patient taking differently: Take 100 mg by mouth as needed.) 10 capsule 0   ferrous sulfate 324 (65 Fe) MG TBEC TAKE 1 TABLET BY MOUTH EVERY DAY (Patient taking differently: Take 324 mg by mouth daily.) 90 tablet 1   levETIRAcetam (KEPPRA) 1000 MG tablet Take 1 tablet (1,000 mg total) by mouth 2 (two) times daily. 60 tablet 0   lisinopril (ZESTRIL) 10 MG tablet TAKE 1 TABLET BY MOUTH EVERY DAY 90 tablet 3   LORazepam (ATIVAN) 2 MG tablet Take 1 tablet (2 mg total) by mouth every 6 (six) hours as needed for seizure. 10 tablet 0   melatonin 3 MG TABS tablet Take 1 tablet (3 mg total) by mouth at bedtime. 60  tablet 3   mirtazapine (REMERON) 7.5 MG tablet Take 1 tablet (7.5 mg total) by mouth at bedtime.     omeprazole (PRILOSEC) 20 MG capsule Take 20 mg by mouth daily.     polyethylene glycol powder (GLYCOLAX/MIRALAX) 17 GM/SCOOP powder Take by mouth.     RESTASIS 0.05 % ophthalmic emulsion Place 1 drop into both eyes 2 (two) times daily.     traZODone (DESYREL) 50 MG tablet TAKE 1 TABLET BY MOUTH EVERYDAY AT BEDTIME 90 tablet 2   dexamethasone (DECADRON) 4 MG tablet Take 4 mg by mouth See admin instructions. Take 1 tablet (4 mg total) by mouth daily with breakfast / Take one tablet (4mg ) on the morning of airplane flight departure and one day after as directed (Patient not taking: Reported on 11/07/2022)     levETIRAcetam (KEPPRA) 750 MG tablet Take 1 tablet (750 mg total) by mouth 2 (two) times daily. (Patient not taking: Reported on 11/07/2022) 180 tablet 3   ondansetron (ZOFRAN) 8 MG tablet TAKE 1 TABLET BY MOUTH 2 TIMES DAILY AS NEEDED FOR NAUSEA AND VOMITING. MAY TAKE 30-60 MINUTES PRIOR TO TEMODAR ADMINISTRATION IF NAUSEA/VOMITING OCCURS. (Patient not taking: Reported on 11/07/2022) 60 tablet 1   No current facility-administered medications for this visit.    PAST  MEDICAL HISTORY: Past Medical History:  Diagnosis Date   Brain cancer (HCC)    Glioma (HCC) 05/18/2021   Hypertension    Pre-diabetes    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HCC)     PAST SURGICAL HISTORY: Past Surgical History:  Procedure Laterality Date   BRAIN SURGERY  05/2021   tumor removal   CESAREAN SECTION     DILATION AND CURETTAGE, DIAGNOSTIC / THERAPEUTIC      FAMILY HISTORY: Family History  Problem Relation Age of Onset   Thyroid disease Mother    Heart attack Father 78   Hypertension Father    Healthy Brother    Healthy Paternal Grandfather    Healthy Daughter     SOCIAL HISTORY: Social History   Socioeconomic History   Marital status: Married    Spouse name: Not on file   Number  of children: 1   Years of education: Not on file   Highest education level: Associate degree: academic program  Occupational History   Occupation: Accounting  Tobacco Use   Smoking status: Never    Passive exposure: Never   Smokeless tobacco: Never  Vaping Use   Vaping status: Never Used  Substance and Sexual Activity   Alcohol use: Not Currently   Drug use: Never   Sexual activity: Yes    Partners: Male    Birth control/protection: Other-see comments, Condom    Comment: husband is infertile  Other Topics Concern   Not on file  Social History Narrative   Right handed   Caffeine-none   Lives with husband and daughter   Social Determinants of Health   Financial Resource Strain: Low Risk  (05/12/2022)   Overall Financial Resource Strain (CARDIA)    Difficulty of Paying Living Expenses: Not hard at all  Food Insecurity: No Food Insecurity (05/12/2022)   Hunger Vital Sign    Worried About Running Out of Food in the Last Year: Never true    Ran Out of Food in the Last Year: Never true  Transportation Needs: No Transportation Needs (05/12/2022)   PRAPARE - Administrator, Civil Service (Medical): No    Lack of Transportation (Non-Medical): No  Physical Activity: Sufficiently Active (05/12/2022)   Exercise Vital Sign    Days of Exercise per Week: 7 days    Minutes of Exercise per Session: 50 min  Stress: No Stress Concern Present (05/12/2022)   [employer] of Occupational Health - Occupational Stress Questionnaire    Feeling of Stress : Not at all  Social Connections: Moderately Integrated (05/12/2022)   Social Connection and Isolation Panel [NHANES]    Frequency of Communication with Friends and Family: More than three times a week    Frequency of Social Gatherings with Friends and Family: More than three times a week    Attends Religious Services: More than 4 times per year    Active Member of [employer] or Organizations: No    Attends Banker Meetings: Not  on file    Marital Status: Married  Intimate Partner Violence: Unknown (05/12/2021)   Received from [employer], [facility]   HITS    Physically Hurt: Not on file    Insult or Talk Down To: Not on file    Threaten Physical Harm: Not on file    Scream or Curse: Not on file      Levert Feinstein, M.D. Ph.D.  [facility] [address] [place], [place] [address] Ph: [phone] Fax: [phone]  CC:  Willow Ora, MD [address] [place],  [place] [address]  Willow Ora, MD

## 2022-11-07 NOTE — Telephone Encounter (Signed)
Called and LVM

## 2022-11-20 ENCOUNTER — Ambulatory Visit (INDEPENDENT_AMBULATORY_CARE_PROVIDER_SITE_OTHER): Payer: 59 | Admitting: Family Medicine

## 2022-11-20 ENCOUNTER — Encounter: Payer: Self-pay | Admitting: Family Medicine

## 2022-11-20 VITALS — BP 120/78 | HR 58 | Temp 97.6°F | Ht 61.0 in | Wt 155.2 lb

## 2022-11-20 DIAGNOSIS — C719 Malignant neoplasm of brain, unspecified: Secondary | ICD-10-CM

## 2022-11-20 DIAGNOSIS — M3501 Sicca syndrome with keratoconjunctivitis: Secondary | ICD-10-CM | POA: Diagnosis not present

## 2022-11-20 DIAGNOSIS — R7401 Elevation of levels of liver transaminase levels: Secondary | ICD-10-CM

## 2022-11-20 DIAGNOSIS — I10 Essential (primary) hypertension: Secondary | ICD-10-CM | POA: Diagnosis not present

## 2022-11-20 DIAGNOSIS — Z0000 Encounter for general adult medical examination without abnormal findings: Secondary | ICD-10-CM

## 2022-11-20 DIAGNOSIS — G40109 Localization-related (focal) (partial) symptomatic epilepsy and epileptic syndromes with simple partial seizures, not intractable, without status epilepticus: Secondary | ICD-10-CM

## 2022-11-20 DIAGNOSIS — Z0001 Encounter for general adult medical examination with abnormal findings: Secondary | ICD-10-CM

## 2022-11-20 DIAGNOSIS — R7303 Prediabetes: Secondary | ICD-10-CM | POA: Diagnosis not present

## 2022-11-20 DIAGNOSIS — Z23 Encounter for immunization: Secondary | ICD-10-CM

## 2022-11-20 DIAGNOSIS — D126 Benign neoplasm of colon, unspecified: Secondary | ICD-10-CM

## 2022-11-20 LAB — POCT GLYCOSYLATED HEMOGLOBIN (HGB A1C): Hemoglobin A1C: 5.5 % (ref 4.0–5.6)

## 2022-11-20 LAB — LIPID PANEL
Cholesterol: 167 mg/dL (ref 0–200)
HDL: 37.5 mg/dL — ABNORMAL LOW (ref >39.00)
LDL Cholesterol: 86 mg/dL (ref 0–99)
NonHDL: 129.98
Total CHOL/HDL Ratio: 4
Triglycerides: 219 mg/dL — ABNORMAL HIGH (ref 0.0–149.0)
VLDL: 43.8 mg/dL — ABNORMAL HIGH (ref 0.0–40.0)

## 2022-11-20 LAB — COMPREHENSIVE METABOLIC PANEL
ALT: 30 U/L (ref 0–35)
AST: 23 U/L (ref 0–37)
Albumin: 4.4 g/dL (ref 3.5–5.2)
Alkaline Phosphatase: 49 U/L (ref 39–117)
BUN: 10 mg/dL (ref 6–23)
CO2: 27 meq/L (ref 19–32)
Calcium: 9.9 mg/dL (ref 8.4–10.5)
Chloride: 102 meq/L (ref 96–112)
Creatinine, Ser: 0.57 mg/dL (ref 0.40–1.20)
GFR: 109.32 mL/min (ref >60.00)
Glucose, Bld: 96 mg/dL (ref 70–99)
Potassium: 3.7 meq/L (ref 3.5–5.1)
Sodium: 135 meq/L (ref 135–145)
Total Bilirubin: 1 mg/dL (ref 0.2–1.2)
Total Protein: 7.3 g/dL (ref 6.0–8.3)

## 2022-11-20 LAB — GAMMA GT: GGT: 22 U/L (ref 7–51)

## 2022-11-20 MED ORDER — LISINOPRIL 20 MG PO TABS: 20.0000 mg | ORAL_TABLET | Freq: Every day | ORAL | 3 refills | Status: DC

## 2022-11-20 NOTE — Patient Instructions (Signed)
Please return in 12 months for your annual complete physical; please come fasting.   I will release your lab results to you on your MyChart account with further instructions. You may see the results before I do, but when I review them I will send you a message with my report or have my assistant call you if things need to be discussed. Please reply to my message with any questions. Thank you!   If you have any questions or concerns, please don't hesitate to send me a message via MyChart or call the office at [phone]. Thank you for visiting with [place] today! It's our pleasure caring for you.

## 2022-11-20 NOTE — Progress Notes (Unsigned)
Subjective  Chief Complaint  Patient presents with   Hypertension   Annual Exam    Pt here for Annual Exam and is currently fasting     HPI: Joanne Jackson is a 47 y.o. female who presents to [facility] [place] [facility] today for a Female Wellness Visit. She also has the concerns and/or needs as listed above in the chief complaint. These will be addressed in addition to the Health Maintenance Visit.   Wellness Visit: annual visit with health maintenance review and exam  HM: screens are current. She is doing well. Daughter is in 1st grade.  Chronic disease f/u and/or acute problem visit: (deemed necessary to be done in addition to the wellness visit): Glioma: clinically stable and radiographically stable/improved. Will continue monitoring with serial imaging . Reviewed recent oncology visit. Has completed radiation and chemotherapy. Reports feels well.  2ndary seizures workign on control with keppra per dr. Terrace Arabia H/o prediabetes due for recheck. Denies sxs of hyperglycemia.  HTN: now back on lisinopril 20 daily and home bp is normal. No cp sob or edema. No cough Sjogrens' is stable Had recent lab work with mildly elevated AST. She questions if chinese herbal supplement is the cause. No sxs.   Assessment  1. Encounter for well adult exam with abnormal findings   2. Need for influenza vaccination   3. 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HCC)   4. Essential hypertension   5. Prediabetes   6. 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HCC)   7. Tubular adenoma of colon   8. Partial symptomatic epilepsy with simple partial seizures, not intractable, without status epilepticus (HCC)   9. Elevated AST (SGOT)      Plan  Female Wellness Visit: Age appropriate Health Maintenance and Prevention measures were discussed with patient. Included topics are cancer screening recommendations, ways to keep healthy (see AVS) including dietary and exercise  recommendations, regular eye and dental care, use of seat belts, and avoidance of moderate alcohol use and tobacco use.  BMI: discussed patient's BMI and encouraged positive lifestyle modifications to help get to or maintain a target BMI. HM needs and immunizations were addressed and ordered. See below for orders. See HM and immunization section for updates. Flu shot today Routine labs and screening tests ordered including cmp, cbc and lipids where appropriate. Discussed recommendations regarding Vit D and calcium supplementation (see AVS)  Chronic disease management visit and/or acute problem visit: Glioma: clinically stable and radiographically stable/improved. Oncology Will continue monitoring with serial imaging  Seizures: stablilizing on keppra Insomnia now controlled on trazadone, melatonin and Remeron.  IFG is now resolved by A1c today: 5.5 Recheck liver tests and screen lipids.  HTN is controlled refilled lisinopril 20 daily.   Follow up: 12 mo for cpe  Orders Placed This Encounter  Procedures   Flu vaccine trivalent PF, 6mos and older(Flulaval,Afluria,Fluarix,Fluzone)   Comprehensive metabolic panel   Lipid panel   Gamma GT   POCT glycosylated hemoglobin (Hb A1C)   No orders of the defined types were placed in this encounter.     Body mass index is 29.32 kg/m. Wt Readings from Last 3 Encounters:  11/20/22 155 lb 3.2 oz (70.4 kg)  11/07/22 157 lb (71.2 kg)  11/05/22 158 lb 11.7 oz (72 kg)     Patient Active Problem List   Diagnosis Date Noted Date Diagnosed   Partial symptomatic epilepsy with simple partial seizures, not intractable, without status epilepticus (HCC) 11/07/2022  Priority: High    Recurrent partial seizure while taking Keppra 750 mg twice a day, dosage was increased to 1000 mg twice a day, overall tolerating it well, Dr. 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 Zachery Conch at [facility] in April 2023 followed by radiation and completed 12 cycles of 5-day Temodar in 28 cycles in June 2024.     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HCC) 10/25/2020     Priority: High   Tubular adenoma of colon 10/24/2018     Priority: High    Repeat cscope in 2027.     Essential hypertension 12/26/2017     Priority: High    Diagnosed in 2012.     Moderate obstructive sleep apnea 10/31/2021     Priority: Medium    Iron deficiency anemia 12/03/2019     Priority: Medium     dx'd 11/2019.     Prediabetes 12/26/2017     Priority: Medium    History of gestational diabetes 10/25/2020     Priority: Low   Intramural leiomyoma of uterus 07/29/2015     Priority: Low   History of craniotomy 11/07/2022    Episode of transient neurologic symptoms 04/28/2021    Health Maintenance  Topic Date Due   INFLUENZA VACCINE  09/06/2022   MAMMOGRAM  01/21/2023   Cervical Cancer Screening (HPV/Pap Cotest)  12/19/2024   Colonoscopy  09/22/2025   DTaP/Tdap/Td (2 - Td or Tdap) 10/13/2025   Hepatitis C Screening  Completed   HIV Screening  Completed   HPV VACCINES  Aged Out   COVID-19 Vaccine  Discontinued   Immunization History  Administered Date(s) Administered   Influenza,inj,Quad PF,6+ Mos 11/11/2015, 10/24/2018, 12/02/2019, 10/25/2020, 11/14/2021   Moderna Sars-Covid-2 Vaccination 05/29/2019, 06/19/2019   PFIZER(Purple Top)SARS-COV-2 Vaccination 01/19/2020   Tdap 10/14/2015   We updated and reviewed the patient's past history in detail and it is documented below. Allergies: Patient has No Known Allergies. Past Medical History Patient  has a past medical history of Brain cancer (HCC), Glioma (HCC) (05/18/2021), Hypertension, Pre-diabetes, and 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HCC). Past Surgical History Patient   has a past surgical history that includes Cesarean section; Dilation and curettage, diagnostic / therapeutic; and Brain surgery (05/2021). Family History: Patient family history includes Healthy in her brother, daughter, and paternal grandfather; Heart attack (age of onset: 57) in her father; Heart disease in her father; Hypertension in her father; Thyroid disease in her mother. Social History:  Patient  reports that she has never smoked. She has never been exposed to tobacco smoke. She has never used smokeless tobacco. She reports that she does not drink alcohol and does not use drugs.  Review of Systems: Constitutional: negative for fever or malaise Ophthalmic: negative for photophobia, double vision or loss of vision Cardiovascular: negative for chest pain, dyspnea on exertion, or new LE swelling Respiratory: negative for SOB or persistent cough Gastrointestinal: negative for abdominal pain, change in bowel habits or melena Genitourinary: negative for dysuria or gross hematuria, no abnormal uterine bleeding or disharge Musculoskeletal: negative for new gait disturbance or muscular weakness Integumentary: negative for new or persistent rashes, no breast lumps Neurological: negative for TIA or stroke symptoms Psychiatric: negative for SI or delusions Allergic/Immunologic: negative for  hives  Patient Care Team    Relationship Specialty Notifications Start End  Willow Ora, MD PCP - General Family Medicine  10/25/20   Genia Del, MD Consulting Physician Obstetrics and Gynecology  10/24/18   Pollyann Savoy, MD Consulting Physician Rheumatology  10/25/20   Mitchell Heir, MD Referring Physician Psychiatry  05/16/22     Objective  Vitals: BP 120/78   Pulse (!) 58   Temp 97.6 F (36.4 C)   Ht 5\' 1"  (1.549 m)   Wt 155 lb 3.2 oz (70.4 kg)   SpO2 97%   BMI 29.32 kg/m  General:  Well developed, well nourished, no acute distress  Psych:  Alert and orientedx3,flat mood and  affect HEENT:  Normocephalic, atraumatic, non-icteric sclera,  supple neck without adenopathy, mass or thyromegaly Cardiovascular:  Normal S1, S2, RRR without gallop, rub or murmur Respiratory:  Good breath sounds bilaterally, CTAB with normal respiratory effort Gastrointestinal: normal bowel sounds, soft, non-tender, no noted masses. No HSM MSK: extremities without edema, joints without erythema or swelling Neurologic:    Mental status is normal.  Gross motor and sensory exams are normal.  No tremor  Commons side effects, risks, benefits, and alternatives for medications and treatment plan prescribed today were discussed, and the patient expressed understanding of the given instructions. Patient is instructed to call or message via MyChart if he/she has any questions or concerns regarding our treatment plan. No barriers to understanding were identified. We discussed Red Flag symptoms and signs in detail. Patient expressed understanding regarding what to do in case of urgent or emergency type symptoms.  Medication list was reconciled, printed and provided to the patient in AVS. Patient instructions and summary information was reviewed with the patient as documented in the AVS. This note was prepared with assistance of Dragon voice recognition software. Occasional wrong-word or sound-a-like substitutions may have occurred due to the inherent limitations of voice recognition software

## 2022-11-21 DIAGNOSIS — Z23 Encounter for immunization: Secondary | ICD-10-CM

## 2022-11-21 NOTE — Progress Notes (Signed)
See mychart note Dear Ms. Bencosme, Your lab results all look good. Your liver tests are normal.  I'm glad you are doing well. Sincerely, Dr. Mardelle Matte

## 2022-12-11 NOTE — Progress Notes (Unsigned)
47 y.o. [id number] Married [place] female here for annual exam.   Has irregular menses since doing chemotherapy.  Last period was light and lasted one day.  Has had 6 cycles in 2024. Normal TSH, prolactin 04/24/22.  FSH 7.9 then.  No hot flashes.   She wants a pap today.   Had IVF for her daughter. Female infertility.   Pelvic US 10/19/21 showing fibroids and possible adenomyosis.   Had brain surgery and chemotherapy last year.  Goes to [facility] every 2 months for a check up.  Has some decreased strength an numbness in her left side.  Increase in Keppra recently to control her seizures.    Able to do household chores.   Has a 64 yo daughter.  Stay at home mom.   PCP: Willow Ora, MD   Patient's last menstrual period was 12/12/2022.     Period Cycle (Days): 28 Period Duration (Days): 3-4 Period Pattern: (!) Irregular Menstrual Flow: Light Menstrual Control: Maxi pad Dysmenorrhea: (!) Mild     Sexually active: Yes.    The current method of family planning is condoms.    Menopausal hormone therapy:  n/a Exercising: Yes.     Walking/running Smoker:  no  OB History  Gravida Para Term Preterm AB Living  1 1 1     1   SAB IAB Ectopic Multiple Live Births               # Outcome Date GA Lbr Len/2nd Weight Sex Type Anes PTL Lv  1 Term              HEALTH MAINTENANCE:    Component Value Date/Time   DIAGPAP  12/19/2021 1550    - Negative for intraepithelial lesion or malignancy (NILM)   DIAGPAP  01/26/2021 1512    - Negative for intraepithelial lesion or malignancy (NILM)   ADEQPAP  12/19/2021 1550    Satisfactory for evaluation; transformation zone component PRESENT.   ADEQPAP  01/26/2021 1512    Satisfactory for evaluation; transformation zone component PRESENT.    History of abnormal Pap or positive HPV:  no Mammogram: 12/20/22 Breast Density Cat C, BI-RADS CAT 1 neg Colonoscopy:  09/23/18 Bone Density:  n/a  Result  n/a   Immunization History  Administered  Date(s) Administered   Influenza, Seasonal, Injecte, Preservative Fre 11/21/2022   Influenza,inj,Quad PF,6+ Mos 11/11/2015, 10/24/2018, 12/02/2019, 10/25/2020, 11/14/2021   Influenza-Unspecified 11/20/2022   Moderna Sars-Covid-2 Vaccination 05/29/2019, 06/19/2019   PFIZER(Purple Top)SARS-COV-2 Vaccination 01/19/2020   Tdap 10/14/2015      reports that she has never smoked. She has never been exposed to tobacco smoke. She has never used smokeless tobacco. She reports that she does not drink alcohol and does not use drugs.  Past Medical History:  Diagnosis Date   Brain cancer (HCC)    Glioma (HCC) 05/18/2021   Hypertension    Pre-diabetes    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HCC)     Past Surgical History:  Procedure Laterality Date   BRAIN SURGERY  05/2021   tumor removal   CESAREAN SECTION     DILATION AND CURETTAGE, DIAGNOSTIC / THERAPEUTIC      Current Outpatient Medications  Medication Sig Dispense Refill   Cholecalciferol (VITAMIN D3) 50 MCG (2000 UT) TABS Take 2,000 Units by mouth daily as needed.      ferrous sulfate 324 (65 Fe) MG TBEC TAKE 1 TABLET BY MOUTH EVERY DAY (Patient taking differently: Take 324 mg by mouth daily.) 90  tablet 1   levETIRAcetam (KEPPRA) 1000 MG tablet Take 1 tablet (1,000 mg total) by mouth 2 (two) times daily. 60 tablet 0   lisinopril (ZESTRIL) 20 MG tablet Take 1 tablet (20 mg total) by mouth daily. 90 tablet 3   melatonin 3 MG TABS tablet Take 1 tablet (3 mg total) by mouth at bedtime. 60 tablet 3   mirtazapine (REMERON) 7.5 MG tablet Take 1 tablet (7.5 mg total) by mouth at bedtime.     omeprazole (PRILOSEC) 20 MG capsule Take 20 mg by mouth daily.     RESTASIS 0.05 % ophthalmic emulsion Place 1 drop into both eyes 2 (two) times daily.     traZODone (DESYREL) 50 MG tablet TAKE 1 TABLET BY MOUTH EVERYDAY AT BEDTIME 90 tablet 2   No current facility-administered medications for this visit.    ALLERGIES: Patient has no  known allergies.  Family History  Problem Relation Age of Onset   Thyroid disease Mother    Heart attack Father 56   Hypertension Father    Heart disease Father    Healthy Brother    Healthy Paternal Grandfather    Healthy Daughter     Review of Systems  All other systems reviewed and are negative.   PHYSICAL EXAM:  BP 118/78 (BP Location: Left Arm, Patient Position: Sitting, Cuff Size: Normal)   Pulse 71   Ht 5' (1.524 m)   Wt 157 lb (71.2 kg)   LMP 12/12/2022   SpO2 97%   BMI 30.66 kg/m     General appearance: alert, cooperative and appears stated age Head: normocephalic, without obvious abnormality, atraumatic Neck: no adenopathy, supple, symmetrical, trachea midline and thyroid normal to inspection and palpation Lungs: clear to auscultation bilaterally Breasts: normal appearance, no masses or tenderness, No nipple retraction or dimpling, No nipple discharge or bleeding, No axillary adenopathy Heart: regular rate and rhythm Abdomen: soft, non-tender; no masses, no organomegaly Extremities: extremities normal, atraumatic, no cyanosis or edema Skin: skin color, texture, turgor normal. No rashes or lesions Lymph nodes: cervical, supraclavicular, and axillary nodes normal. Neurologic: grossly normal  Pelvic: External genitalia:  no lesions              No abnormal inguinal nodes palpated.              Urethra:  normal appearing urethra with no masses, tenderness or lesions              Bartholins and Skenes: normal                 Vagina: normal appearing vagina with normal color and discharge, no lesions              Cervix: no lesions              Pap taken: {yes no:314532} Bimanual Exam:  Uterus:  normal size, contour, position, consistency, mobility, non-tender              Adnexa: no mass, fullness, tenderness              Rectal exam: {yes no:314532}.  Confirms.              Anus:  normal sphincter tone, no lesions  Chaperone was present for exam:   {BSCHAPERONE:31226::"Emily F, CMA"}  {LABS (Optional):23779}  ASSESSMENT: Well woman visit with gynecologic exam Hx glioma of brain.  Oligomenorrhea.  ***  PLAN: Mammogram screening discussed. Self breast awareness reviewed. Pap and HRV collected:  {yes  WU:[id number]} Guidelines for Calcium, Vitamin D, regular exercise program including cardiovascular and weight bearing exercise. Medication refills:  *** Follow up:  ***    Additional counseling given.  {yes [id number]. ***  total time was spent for this patient encounter, including preparation, face-to-face counseling with the patient, coordination of care, and documentation of the encounter in addition to doing the well woman visit with gynecologic exam.   An After Visit Summary was provided to the patient.

## 2022-12-13 ENCOUNTER — Encounter: Payer: Self-pay | Admitting: Family Medicine

## 2022-12-17 ENCOUNTER — Other Ambulatory Visit: Payer: Self-pay | Admitting: Obstetrics and Gynecology

## 2022-12-17 DIAGNOSIS — Z1231 Encounter for screening mammogram for malignant neoplasm of breast: Secondary | ICD-10-CM

## 2022-12-18 ENCOUNTER — Encounter: Payer: Self-pay | Admitting: Neurology

## 2022-12-18 DIAGNOSIS — Z9889 Other specified postprocedural states: Secondary | ICD-10-CM

## 2022-12-18 DIAGNOSIS — G40109 Localization-related (focal) (partial) symptomatic epilepsy and epileptic syndromes with simple partial seizures, not intractable, without status epilepticus: Secondary | ICD-10-CM

## 2022-12-18 NOTE — Telephone Encounter (Signed)
I called patient and her husband  While taking Keppra 1000/500/1000 mg,  Recurrent partial seizure on December 21, 2022, again December 17, 2022,  Woke up early morning on November 12 2 AM, with troubling thoughts, thought about hurting herself, lasting about 30 minutes, went to sleep, feeling okay now  She is on Remeron for anxiety, sleep, which has been helpful, denies significant depression anxiety at baseline, the described early morning episode could due to frontal partial seizure, versus side effect from Keppra  I have suggested her higher dose of Keppra 1500 mg twice a day  Also suggest lamotrigine to replace Keppra for mood benefit  She will contact her [facility] oncologist Dr.Khasraw for guidance, advised patient to call office for any recurrent event

## 2022-12-20 ENCOUNTER — Telehealth: Payer: Self-pay | Admitting: Neurology

## 2022-12-20 ENCOUNTER — Ambulatory Visit
Admission: RE | Admit: 2022-12-20 | Discharge: 2022-12-20 | Disposition: A | Discharge status: Home / Self Care | Payer: 59 | Source: Ambulatory Visit

## 2022-12-20 DIAGNOSIS — Z1231 Encounter for screening mammogram for malignant neoplasm of breast: Secondary | ICD-10-CM

## 2022-12-20 NOTE — Telephone Encounter (Signed)
Pt LVM on 11/13 to reschedule EEG appointment. Contacted pt to reschedule. Appointment was rescheduled to 12/17 at 2 PM.

## 2022-12-25 ENCOUNTER — Encounter: Payer: Self-pay | Admitting: Obstetrics and Gynecology

## 2022-12-25 ENCOUNTER — Other Ambulatory Visit (HOSPITAL_COMMUNITY)
Admission: RE | Admit: 2022-12-25 | Discharge: 2022-12-25 | Disposition: A | Discharge status: Home / Self Care | Payer: 59 | Source: Ambulatory Visit | Attending: Obstetrics and Gynecology | Admitting: Obstetrics and Gynecology

## 2022-12-25 ENCOUNTER — Ambulatory Visit (INDEPENDENT_AMBULATORY_CARE_PROVIDER_SITE_OTHER): Payer: 59 | Admitting: Obstetrics and Gynecology

## 2022-12-25 VITALS — BP 118/78 | HR 71 | Ht 60.0 in | Wt 157.0 lb

## 2022-12-25 DIAGNOSIS — Z01419 Encounter for gynecological examination (general) (routine) without abnormal findings: Secondary | ICD-10-CM

## 2022-12-25 DIAGNOSIS — Z124 Encounter for screening for malignant neoplasm of cervix: Secondary | ICD-10-CM | POA: Insufficient documentation

## 2022-12-25 NOTE — Patient Instructions (Signed)

## 2022-12-26 ENCOUNTER — Other Ambulatory Visit: Payer: Self-pay

## 2022-12-26 LAB — CYTOLOGY - PAP
Adequacy: ABSENT
Comment: NEGATIVE
Diagnosis: NEGATIVE
High risk HPV: NEGATIVE

## 2023-01-22 ENCOUNTER — Ambulatory Visit (INDEPENDENT_AMBULATORY_CARE_PROVIDER_SITE_OTHER): Payer: 59 | Admitting: Family Medicine

## 2023-01-22 ENCOUNTER — Other Ambulatory Visit: Payer: 59 | Admitting: *Deleted

## 2023-01-22 ENCOUNTER — Ambulatory Visit (INDEPENDENT_AMBULATORY_CARE_PROVIDER_SITE_OTHER): Payer: 59 | Admitting: Neurology

## 2023-01-22 ENCOUNTER — Encounter: Payer: Self-pay | Admitting: Family Medicine

## 2023-01-22 VITALS — BP 120/86 | HR 68 | Temp 98.6°F | Ht 60.0 in | Wt 156.4 lb

## 2023-01-22 DIAGNOSIS — R140 Abdominal distension (gaseous): Secondary | ICD-10-CM | POA: Diagnosis not present

## 2023-01-22 DIAGNOSIS — Z9889 Other specified postprocedural states: Secondary | ICD-10-CM

## 2023-01-22 DIAGNOSIS — C719 Malignant neoplasm of brain, unspecified: Secondary | ICD-10-CM

## 2023-01-22 DIAGNOSIS — G40109 Localization-related (focal) (partial) symptomatic epilepsy and epileptic syndromes with simple partial seizures, not intractable, without status epilepticus: Secondary | ICD-10-CM

## 2023-01-22 LAB — HEPATIC FUNCTION PANEL
ALT: 27 U/L (ref 0–35)
AST: 17 U/L (ref 0–37)
Albumin: 4.4 g/dL (ref 3.5–5.2)
Alkaline Phosphatase: 54 U/L (ref 39–117)
Bilirubin, Direct: 0.1 mg/dL (ref 0.0–0.3)
Total Bilirubin: 0.6 mg/dL (ref 0.2–1.2)
Total Protein: 7.2 g/dL (ref 6.0–8.3)

## 2023-01-22 NOTE — Patient Instructions (Addendum)
Please follow up if symptoms do not improve or as needed.    VISIT SUMMARY:  During today's visit, we discussed your recent episodes of right upper quadrant bloating. We reviewed your symptoms and considered potential causes, including dietary factors and your long-term iron supplementation. We also discussed your general health maintenance and ongoing iron supplementation.  YOUR PLAN:  -RIGHT UPPER QUADRANT BLOATING: Right upper quadrant bloating refers to a sensation of fullness or swelling in the upper right part of the abdomen. This can be caused by various factors, including diet and medications. We will order liver function tests to check for any liver issues and consult with your oncology team to determine if liver imaging is needed. Please continue to monitor your symptoms and avoid the herbal Chinese soup for now.  -GENERAL HEALTH MAINTENANCE: You are generally in good health with no new concerns. Continue your current iron supplementation as it has been beneficial for you. We will review your routine labs and ensure everything remains normal.  INSTRUCTIONS:  Please complete the liver function tests as soon as possible. We will review the results and discuss them with your oncology team to decide if further imaging is necessary. Schedule a follow-up appointment as needed to go over the results and any additional steps.

## 2023-01-22 NOTE — Progress Notes (Signed)
Subjective  CC:  Chief Complaint  Patient presents with   Abdominal Pain    Pt state that she is still experiencing some abdominal bloating and this has ben going on since her last visit    HPI: Joanne Jackson is a 47 y.o. female who presents to the office today to address the problems listed above in the chief complaint. Discussed the use of AI scribe software for clinical note transcription with the patient, who gave verbal consent to proceed.  History of Present Illness   The patient, with a history of  brain cancer, presents with daily, brief episodes of right upper quadrant bloating. The sensation, which is not associated with pain, pressure, or discomfort, lasts for a few minutes and resolves spontaneously. The bloating does not appear to be related to meals and does not affect the patient's appetite or bowel habits. The patient denies any changes in bowel color or consistency. She has been taking an herbal Chinese soup for three months, which she believes may be contributing to the bloating. The patient is also on long-term iron supplementation. We checked her lfts in October and they were normal but she wants to be certain her liver is ok so she can continue her alternative treatment.      Assessment  1. Abdominal bloating   2. 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 No associated pain, pressure, or discomfort. Normal liver function tests previously. Differential diagnosis includes dietary factors (Chinese herb soup) and long-term iron supplementation. No impact on appetite or bowel movements. Patient and spouse concerned about potential liver effects from [place] herb soup. Previous liver tests were normal, no immediate concerns identified. - Order liver function tests - Consult oncology team regarding need for  liver imaging (e.g., CT scan)  General Health Maintenance Generally well with no new health concerns. Continues long-term iron supplementation without issues. - Continue current iron supplementation  Follow-up - Review liver function test results - Discuss imaging results with oncology team if necessary - Schedule follow-up appointment as needed.        Orders Placed This Encounter  Procedures   Hepatic function panel   No orders of the defined types were placed in this encounter.    I reviewed the patients updated PMH, FH, and SocHx.    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) 04/30/2021    Priority: High   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HCC) 10/25/2020    Priority: High   Tubular adenoma of colon 10/24/2018    Priority: High   Essential hypertension 12/26/2017    Priority: High   Moderate obstructive sleep apnea 10/31/2021    Priority: Medium    Iron deficiency anemia 12/03/2019    Priority: Medium    Prediabetes 12/26/2017    Priority: Medium    History of gestational diabetes 10/25/2020    Priority: Low   Intramural leiomyoma of uterus 07/29/2015    Priority: Low   History of craniotomy 11/07/2022   Episode of transient neurologic symptoms 04/28/2021   Current Meds  Medication Sig  Cholecalciferol (VITAMIN D3) 50 MCG (2000 UT) TABS Take 2,000 Units by mouth daily as needed.    ferrous sulfate 324 (65 Fe) MG TBEC TAKE 1 TABLET BY MOUTH EVERY DAY (Patient taking differently: Take 324 mg by mouth daily.)   levETIRAcetam (KEPPRA) 1000 MG tablet Take 1 tablet (1,000 mg total) by mouth 2 (two) times daily. (Patient taking differently: Take 1,000 mg by mouth 2 (two) times daily. Patient  taking 150 2x a Day)   lisinopril (ZESTRIL) 20 MG tablet Take 1 tablet (20 mg total) by mouth daily.   melatonin 3 MG TABS tablet Take 1 tablet (3 mg total) by mouth at bedtime.   mirtazapine (REMERON) 7.5 MG tablet Take 1 tablet (7.5 mg total) by mouth at bedtime.   omeprazole (PRILOSEC) 20 MG capsule Take 20 mg by mouth daily.   RESTASIS 0.05 % ophthalmic emulsion Place 1 drop into both eyes 2 (two) times daily.   traZODone (DESYREL) 50 MG tablet TAKE 1 TABLET BY MOUTH EVERYDAY AT BEDTIME    Allergies: Patient has no known allergies. Family History: Patient family history includes Healthy in her brother, daughter, and paternal grandfather; Heart attack (age of onset: 38) in her father; Heart disease in her father; Hypertension in her father; Thyroid disease in her mother. Social History:  Patient  reports that she has never smoked. She has never been exposed to tobacco smoke. She has never used smokeless tobacco. She reports that she does not drink alcohol and does not use drugs.  Review of Systems: Constitutional: Negative for fever malaise or anorexia Cardiovascular: negative for chest pain Respiratory: negative for SOB or persistent cough Gastrointestinal: negative for abdominal pain  Objective  Vitals: BP 120/86   Pulse 68   Temp 98.6 F (37 C)   Ht 5' (1.524 m)   Wt 156 lb 6.4 oz (70.9 kg)   LMP 12/12/2022   SpO2 98%   BMI 30.54 kg/m  General: no acute distress , A&Ox3 HEENT: PEERL, conjunctiva normal, neck is supple Cardiovascular:  RRR without murmur or gallop.  Respiratory:  Good breath sounds bilaterally, CTAB with normal respiratory effort Gastrointestinal: soft, flat abdomen, normal active bowel sounds, no palpable masses, no hepatosplenomegaly, no appreciated hernias No ttp  Lab Results  Component Value Date   ALT 30 11/20/2022   AST 23 11/20/2022   ALKPHOS 49 11/20/2022   BILITOT 1.0 11/20/2022     Commons side effects, risks, benefits, and alternatives  for medications and treatment plan prescribed today were discussed, and the patient expressed understanding of the given instructions. Patient is instructed to call or message via MyChart if he/she has any questions or concerns regarding our treatment plan. No barriers to understanding were identified. We discussed Red Flag symptoms and signs in detail. Patient expressed understanding regarding what to do in case of urgent or emergency type symptoms.  Medication list was reconciled, printed and provided to the patient in AVS. Patient instructions and summary information was reviewed with the patient as documented in the AVS. This note was prepared with assistance of Dragon voice recognition software. Occasional wrong-word or sound-a-like substitutions may have occurred due to the inherent limitations of voice recognition software

## 2023-01-22 NOTE — Progress Notes (Signed)
See mychart note Dear Ms. Joanne Jackson, Good news again: your liver tests are normal.  If your abdominal symptoms worsen, please let me know. Sincerely, Dr. Mardelle Matte

## 2023-01-23 NOTE — Procedures (Signed)
   HISTORY: History of right frontal high-grade glioma, status post right frontal craniectomy, followed by radiation, chemotherapy, recurrent partial seizure,  TECHNIQUE:  This is a routine 16 channel EEG recording with one channel devoted to a limited EKG recording.  It was performed during wakefulness, drowsiness and asleep.  Hyperventilation and photic stimulation were performed as activating procedures.  There are minimum muscle and movement artifact noted.  Upon maximum arousal, posterior dominant waking rhythm consistent of rhythmic alpha range activity. Activities are symmetric over the bilateral posterior derivations and attenuated with eye opening.  There are noticeable higher amplitude breach rhythm at right frontal region involving F4, C4, with occasionally large amplitude theta range activity, occasionally short lasting 2 to 3 Hz activity lasting less than 2 seconds,  Photic stimulation did not alter the tracing.  Hyperventilation produced mild/moderate buildup with higher amplitude and the slower activities noted.  During EEG recording, patient developed drowsiness and entered sleep, sleep EEG demonstrated architecture, there were frontal centrally dominant vertex waves and symmetric sleep spindles noted.  EKG demonstrate normal sinus rhythm.  CONCLUSION: This is an abnormal awake and asleep EEG, there is evidence of breach rhythm at the right frontal region, with protrusion of transient slowing, indicating focal irritability.  Levert Feinstein, M.D. Ph.D.  [facility] [address] [place], [place] [address] Phone: [phone] Fax:      [phone]

## 2023-03-21 NOTE — Progress Notes (Signed)
 Office Visit Note  Patient: Joanne Jackson             Date of Birth: 17-Feb-1975           MRN: [id number]             PCP: Willow Ora, MD Referring: Willow Ora, MD Visit Date: 04/04/2023 Occupation: @GUAROCC @  Subjective:  Dry mouth and dry eyes  History of Present Illness: [name] is a 48 y.o. female with Sjogren's disease.  She states she continues to have dry mouth and dry eye symptoms.  She has been using over-the-counter products.  She has not had any dental cavities.  She has been going for dental work on a regular basis.  She finished chemotherapy and radiation therapy.  She feels some stiffness in her joints but she has not noticed any joint swelling.  She states this summer her mother was diagnosed with rheumatoid arthritis.  She denies any shortness of breath or lymphadenopathy.    Activities of Daily Living:  Patient reports morning stiffness for less than 5 minutes.   Patient Denies nocturnal pain.  Difficulty dressing/grooming: Denies Difficulty climbing stairs: Denies Difficulty getting out of chair: Denies Difficulty using hands for taps, buttons, cutlery, and/or writing: Denies  Review of Systems  Constitutional:  Positive for fatigue.  HENT:  Positive for mouth dryness. Negative for mouth sores.   Eyes:  Positive for dryness.  Respiratory:  Negative for shortness of breath.   Cardiovascular:  Negative for chest pain and palpitations.  Gastrointestinal:  Negative for blood in stool, constipation and diarrhea.  Endocrine: Negative for increased urination.  Genitourinary:  Negative for involuntary urination.  Musculoskeletal:  Positive for joint pain, joint pain and morning stiffness. Negative for gait problem, joint swelling, myalgias, muscle weakness, muscle tenderness and myalgias.  Skin:  Negative for color change, rash, hair loss and sensitivity to sunlight.  Allergic/Immunologic: Negative for susceptible to infections.  Neurological:  Negative  for dizziness and headaches.  Hematological:  Negative for swollen glands.  Psychiatric/Behavioral:  Positive for sleep disturbance. Negative for depressed mood. The patient is not nervous/anxious.     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) 04/30/2021   Episode of transient neurologic symptoms 04/28/2021   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HCC) 10/25/2020   History of gestational diabetes 10/25/2020   Iron deficiency anemia 12/03/2019   Tubular adenoma of colon 10/24/2018   Essential hypertension 12/26/2017   Prediabetes 12/26/2017   Intramural leiomyoma of uterus 07/29/2015    Past Medical History:  Diagnosis Date   Brain cancer (HCC)    Glioma (HCC) 05/18/2021   Hypertension    Pre-diabetes    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HCC)     Family History  Problem Relation Age of Onset   Thyroid disease Mother    Rheum arthritis Mother    Heart attack Father 20   Hypertension Father    Heart disease Father    Healthy Brother    Healthy Paternal Grandfather    Healthy Daughter    Past Surgical History:  Procedure Laterality Date   BRAIN SURGERY  05/2021   tumor removal   CESAREAN SECTION  DILATION AND CURETTAGE, DIAGNOSTIC / THERAPEUTIC     Social History   Social History Narrative   Right handed   Caffeine-none   Lives with husband and daughter   Immunization History  Administered Date(s) Administered   Influenza, Seasonal, Injecte, Preservative Fre 11/21/2022   Influenza,inj,Quad PF,6+ Mos 11/11/2015, 10/24/2018, 12/02/2019, 10/25/2020, 11/14/2021   Influenza-Unspecified 11/20/2022   Moderna Sars-Covid-2  Vaccination 05/29/2019, 06/19/2019   PFIZER(Purple Top)SARS-COV-2 Vaccination 01/19/2020   Tdap 10/14/2015     Objective: Vital Signs: BP (!) 132/94 (BP Location: Left Arm, Patient Position: Sitting, Cuff Size: Normal)   Pulse 76   Resp 14   Ht 5' (1.524 m)   Wt 160 lb (72.6 kg)   LMP 04/02/2023   BMI 31.25 kg/m    Physical Exam Vitals and nursing note reviewed.  Constitutional:      Appearance: She is well-developed.  HENT:     Head: Normocephalic and atraumatic.  Eyes:     Conjunctiva/sclera: Conjunctivae normal.  Cardiovascular:     Rate and Rhythm: Normal rate and regular rhythm.     Heart sounds: Normal heart sounds.  Pulmonary:     Effort: Pulmonary effort is normal.     Breath sounds: Normal breath sounds.  Abdominal:     General: Bowel sounds are normal.     Palpations: Abdomen is soft.  Musculoskeletal:     Cervical back: Normal range of motion.  Lymphadenopathy:     Cervical: No cervical adenopathy.  Skin:    General: Skin is warm and dry.     Capillary Refill: Capillary refill takes less than 2 seconds.  Neurological:     Mental Status: She is alert and oriented to person, place, and time.  Psychiatric:        Behavior: Behavior normal.      Musculoskeletal Exam: Cervical, thoracic and lumbar spine 1 good range of motion.  Shoulders, elbows, wrists, MCPs PIPs and DIPs Juengel range of motion with no synovitis.  Hips, knee joints, ankles, MTPs and PIPs were in good range of motion with no synovitis.  CDAI Exam: CDAI Score: -- Patient Global: --; Provider Global: -- Swollen: --; Tender: -- Joint Exam 04/04/2023   No joint exam has been documented for this visit   There is currently no information documented on the homunculus. Go to the Rheumatology activity and complete the homunculus joint exam.  Investigation: No additional findings.  Imaging: MR OUTSIDE FILMS HEAD/FACE Result Date: 03/14/2023 This examination belongs to an outside facility and  is stored here for comparison purposes only.  Contact the originating outside institution for any associated report or interpretation.   Recent Labs: Lab Results  Component Value Date   WBC 3.4 (L) 11/05/2022   HGB 12.4 11/05/2022   PLT 155 11/05/2022   NA 135 11/20/2022   K 3.7 11/20/2022   CL 102 11/20/2022   CO2 27 11/20/2022   GLUCOSE 96 11/20/2022   BUN 10 11/20/2022   CREATININE 0.57 11/20/2022   BILITOT 0.6 01/22/2023   ALKPHOS 54 01/22/2023   AST 17 01/22/2023   ALT 27 01/22/2023   PROT 7.2 01/22/2023   ALBUMIN 4.4 01/22/2023   CALCIUM 9.9 11/20/2022   GFRAA 138 11/24/2019    Speciality Comments: No specialty comments available.  Procedures:  No procedures performed Allergies: Patient has no known allergies.   Assessment / Plan:     Visit Diagnoses: 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HCC) - ANA 1:160 NS, RF<14, sed rate 26, Ro+: -She continues  to have dry mouth and dry eyes.  Over-the-counter products were discussed.  She has been using some products.  She denies any history of lymphadenopathy or shortness of breath.  Increased risk of lymphoma and ILD was discussed.  Increased risk of arrhythmias was also discussed.  She denies any palpitations.  She has been going to the dentist on a regular basis.  Plan: Protein / creatinine ratio, urine, CBC with Differential/Platelet, COMPLETE METABOLIC PANEL WITH GFR, Anti-DNA antibody, double-stranded, C3 and C4, Sedimentation rate, Sjogrens syndrome-A extractable nuclear antibody, ANA  Joint stiffness-she is been experiencing joint stiffness.  No synovitis was noted on the examination.  Chronic pain of both knees -she had good range of motion without any warmth swelling or effusion.  X-rays of both knee joints were unremarkable on 11/25/18.  Other fatigue-most likely related to sleep apnea.  Moderate obstructive sleep apnea - she is followed by Dr. Maple Hudson.  Vitamin D deficiency  Prediabetes  Essential  hypertension-blood pressure was elevated at 132/94.  She was advised to monitor blood pressure closely.  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  She had surgery at [facility] followed by chemotherapy and radiation therapy.  She is still followed at [facility].  Patient will be going to her appointments every 3 months next year.  I reviewed her notes from December 2024.  According to the oncology note she is radiographically stable.  She gets serial imaging every 11 weeks.  Family history of rheumatoid arthritis-mother  Orders: Orders Placed This Encounter  Procedures   Protein / creatinine ratio, urine   CBC with Differential/Platelet   COMPLETE METABOLIC PANEL WITH GFR   Anti-DNA antibody, double-stranded   C3 and C4   Sedimentation rate   Sjogrens syndrome-A extractable nuclear antibody   ANA   No orders of the defined types were placed in this encounter.    Follow-Up Instructions: Return in about 6 months (around 10/02/2023) for Sjogren's.   Pollyann Savoy, MD  Note - This record has been created using Animal nutritionist.  Chart creation errors have been sought, but may not always  have been located. Such creation errors do not reflect on  the standard of medical care.

## 2023-03-28 ENCOUNTER — Encounter: Payer: Self-pay | Admitting: Family Medicine

## 2023-03-31 ENCOUNTER — Encounter: Payer: Self-pay | Admitting: Rheumatology

## 2023-04-04 ENCOUNTER — Encounter: Payer: Self-pay | Admitting: Rheumatology

## 2023-04-04 ENCOUNTER — Ambulatory Visit: Payer: 59 | Attending: Rheumatology | Admitting: Rheumatology

## 2023-04-04 ENCOUNTER — Telehealth: Payer: Self-pay | Admitting: *Deleted

## 2023-04-04 VITALS — BP 132/94 | HR 76 | Resp 14 | Ht 60.0 in | Wt 160.0 lb

## 2023-04-04 DIAGNOSIS — D126 Benign neoplasm of colon, unspecified: Secondary | ICD-10-CM

## 2023-04-04 DIAGNOSIS — R5383 Other fatigue: Secondary | ICD-10-CM

## 2023-04-04 DIAGNOSIS — M25561 Pain in right knee: Secondary | ICD-10-CM | POA: Diagnosis not present

## 2023-04-04 DIAGNOSIS — M2560 Stiffness of unspecified joint, not elsewhere classified: Secondary | ICD-10-CM | POA: Diagnosis not present

## 2023-04-04 DIAGNOSIS — Z8261 Family history of arthritis: Secondary | ICD-10-CM

## 2023-04-04 DIAGNOSIS — M3501 Sicca syndrome with keratoconjunctivitis: Secondary | ICD-10-CM | POA: Diagnosis not present

## 2023-04-04 DIAGNOSIS — G8929 Other chronic pain: Secondary | ICD-10-CM

## 2023-04-04 DIAGNOSIS — E559 Vitamin D deficiency, unspecified: Secondary | ICD-10-CM

## 2023-04-04 DIAGNOSIS — M25562 Pain in left knee: Secondary | ICD-10-CM

## 2023-04-04 DIAGNOSIS — G4733 Obstructive sleep apnea (adult) (pediatric): Secondary | ICD-10-CM

## 2023-04-04 DIAGNOSIS — R7303 Prediabetes: Secondary | ICD-10-CM

## 2023-04-04 DIAGNOSIS — C719 Malignant neoplasm of brain, unspecified: Secondary | ICD-10-CM

## 2023-04-04 DIAGNOSIS — I10 Essential (primary) hypertension: Secondary | ICD-10-CM

## 2023-04-04 NOTE — Telephone Encounter (Signed)
 Patient unable to void at the time of her appointment. Patient advised she would come back on 04/05/2023 to provide sample.

## 2023-04-05 ENCOUNTER — Encounter: Payer: Self-pay | Admitting: Internal Medicine

## 2023-04-06 ENCOUNTER — Encounter: Payer: Self-pay | Admitting: Internal Medicine

## 2023-04-06 LAB — C3 AND C4
C3 Complement: 151 mg/dL (ref 83–193)
C4 Complement: 20 mg/dL (ref 15–57)

## 2023-04-06 LAB — COMPLETE METABOLIC PANEL WITH GFR
AG Ratio: 1.7 (calc) (ref 1.0–2.5)
ALT: 31 U/L — ABNORMAL HIGH (ref 6–29)
AST: 23 U/L (ref 10–35)
Albumin: 4.7 g/dL (ref 3.6–5.1)
Alkaline phosphatase (APISO): 62 U/L (ref 31–125)
BUN/Creatinine Ratio: 17 (calc) (ref 6–22)
BUN: 8 mg/dL (ref 7–25)
CO2: 24 mmol/L (ref 20–32)
Calcium: 9.7 mg/dL (ref 8.6–10.2)
Chloride: 104 mmol/L (ref 98–110)
Creat: 0.46 mg/dL — ABNORMAL LOW (ref 0.50–0.99)
Globulin: 2.7 g/dL (ref 1.9–3.7)
Glucose, Bld: 93 mg/dL (ref 65–99)
Potassium: 4 mmol/L (ref 3.5–5.3)
Sodium: 138 mmol/L (ref 135–146)
Total Bilirubin: 0.7 mg/dL (ref 0.2–1.2)
Total Protein: 7.4 g/dL (ref 6.1–8.1)
eGFR: 119 mL/min/{1.73_m2} (ref >=60)

## 2023-04-06 LAB — CBC WITH DIFFERENTIAL/PLATELET
Absolute Lymphocytes: 1081 {cells}/uL (ref 850–3900)
Absolute Monocytes: 354 {cells}/uL (ref 200–950)
Basophils Absolute: 9 {cells}/uL (ref 0–200)
Basophils Relative: 0.2 %
Eosinophils Absolute: 32 {cells}/uL (ref 15–500)
Eosinophils Relative: 0.7 %
HCT: 43.1 % (ref 35.0–45.0)
Hemoglobin: 14.5 g/dL (ref 11.7–15.5)
MCH: 29.5 pg (ref 27.0–33.0)
MCHC: 33.6 g/dL (ref 32.0–36.0)
MCV: 87.8 fL (ref 80.0–100.0)
MPV: 9.6 fL (ref 7.5–12.5)
Monocytes Relative: 7.7 %
Neutro Abs: 3123 {cells}/uL (ref 1500–7800)
Neutrophils Relative %: 67.9 %
Platelets: 207 10*3/uL (ref 140–400)
RBC: 4.91 10*6/uL (ref 3.80–5.10)
RDW: 12 % (ref 11.0–15.0)
Total Lymphocyte: 23.5 %
WBC: 4.6 10*3/uL (ref 3.8–10.8)

## 2023-04-06 LAB — ANTI-NUCLEAR AB-TITER (ANA TITER): ANA Titer 1: 1:80 {titer} — ABNORMAL HIGH

## 2023-04-06 LAB — PROTEIN / CREATININE RATIO, URINE
Creatinine, Urine: 76 mg/dL (ref 20–275)
Protein/Creat Ratio: 53 mg/g{creat} (ref 24–184)
Protein/Creatinine Ratio: 0.053 mg/mg{creat} (ref 0.024–0.184)
Total Protein, Urine: 4 mg/dL — ABNORMAL LOW (ref 5–24)

## 2023-04-06 LAB — SEDIMENTATION RATE: Sed Rate: 11 mm/h (ref 0–20)

## 2023-04-06 LAB — SJOGRENS SYNDROME-A EXTRACTABLE NUCLEAR ANTIBODY: SSA (Ro) (ENA) Antibody, IgG: >8 AI — AB

## 2023-04-06 LAB — ANA: Anti Nuclear Antibody (ANA): POSITIVE — AB

## 2023-04-06 LAB — ANTI-DNA ANTIBODY, DOUBLE-STRANDED: ds DNA Ab: <1 [IU]/mL

## 2023-04-07 NOTE — Progress Notes (Signed)
 Urine protein creatinine ratio normal ANA and SSA antibodies remain positive, liver function mildly elevated, CBC normal, double-stranded DNA negative, sed rate normal.  Patient should avoid NSAIDs.  No change in treatment advised.

## 2023-05-27 ENCOUNTER — Encounter: Payer: Self-pay | Admitting: Obstetrics and Gynecology

## 2023-05-29 NOTE — Telephone Encounter (Signed)
 Call placed to patient, left detailed message, ok per dpr. Advised per Dr. Colvin Dec. Return call to office at [phone], opt 1 to schedule appt; opt 4 to further discuss.

## 2023-05-30 ENCOUNTER — Encounter: Payer: Self-pay | Admitting: Radiology

## 2023-05-31 ENCOUNTER — Encounter: Payer: Self-pay | Admitting: Radiology

## 2023-06-04 ENCOUNTER — Ambulatory Visit: Admitting: Radiology

## 2023-06-10 ENCOUNTER — Encounter: Payer: Self-pay | Admitting: Family Medicine

## 2023-06-10 ENCOUNTER — Encounter: Payer: Self-pay | Admitting: Obstetrics and Gynecology

## 2023-06-11 NOTE — Telephone Encounter (Signed)
 Routing FYI.

## 2023-06-12 ENCOUNTER — Encounter: Payer: Self-pay | Admitting: Family Medicine

## 2023-06-12 ENCOUNTER — Ambulatory Visit (INDEPENDENT_AMBULATORY_CARE_PROVIDER_SITE_OTHER): Admitting: Family Medicine

## 2023-06-12 VITALS — BP 138/83 | HR 71 | Temp 98.6°F | Ht 60.0 in | Wt 161.0 lb

## 2023-06-12 DIAGNOSIS — C719 Malignant neoplasm of brain, unspecified: Secondary | ICD-10-CM | POA: Diagnosis not present

## 2023-06-12 DIAGNOSIS — I10 Essential (primary) hypertension: Secondary | ICD-10-CM

## 2023-06-12 DIAGNOSIS — N3281 Overactive bladder: Secondary | ICD-10-CM

## 2023-06-12 DIAGNOSIS — R3915 Urgency of urination: Secondary | ICD-10-CM | POA: Diagnosis not present

## 2023-06-12 LAB — URINALYSIS, ROUTINE W REFLEX MICROSCOPIC
Bilirubin Urine: NEGATIVE
Ketones, ur: NEGATIVE
Leukocytes,Ua: NEGATIVE
Nitrite: NEGATIVE
Specific Gravity, Urine: 1.02 (ref 1.000–1.030)
Total Protein, Urine: NEGATIVE
Urine Glucose: NEGATIVE
Urobilinogen, UA: 0.2 (ref 0.0–1.0)
pH: 6 (ref 5.0–8.0)

## 2023-06-12 MED ORDER — MIRABEGRON ER 25 MG PO TB24
25.0000 mg | ORAL_TABLET | Freq: Every day | ORAL | 11 refills | Status: DC
Start: 2023-06-12 — End: 2023-06-13

## 2023-06-12 NOTE — Patient Instructions (Signed)
 Please return in October for your annual complete physical; please come fasting.   If you have any questions or concerns, please don't hesitate to send me a message via MyChart or call the office at [phone]. Thank you for visiting with us  today! It's our pleasure caring for you.   Overactive Bladder in Adults: What to Know  Overactive bladder (OAB) is when you have trouble holding your pee. You may feel the need to pee often or all of a sudden. You may also leak pee if you can't get to a bathroom fast enough. These symptoms may get in the way of your daily life. What are the causes? OAB may be caused by poor nerve signals between your bladder and your brain. Your bladder may get the signal to empty before it's full. Or, your bladder may be too sensitive. This can cause it to squeeze too soon. Other causes include: Medical problems, such as: A urinary tract infection (UTI). Swelling or stones in your bladder. A tumor, which is a growth of cells that isn't normal. Diabetes. Muscle or nerve weakness. This may be caused by: An injury to your spinal cord. A stroke. Multiple sclerosis. Surgery on nearby tissues. Drinking caffeine  or alcohol. Some medicines. These include ones to lower the amount of fluid in your body. Trouble pooping, also called constipation. What increases the risk? You may be more at risk if: You're an older adult. You're going through menopause. You have a big prostate or problems with your prostate. You're overweight. You smoke. You eat or drink things that irritate your bladder. These may include tea or spicy food. What are the signs or symptoms? A sudden, strong need to pee. Peeing 8 or more times a day. Leaking pee. Waking up to pee 2 or more times a night. How is this diagnosed? You may be diagnosed based on your symptoms, medical history, and an exam. You may also have tests. These may include: Blood tests. Tests of your pee to check for an  infection. Tests to check the flow of your pee. These are called urodynamic tests. You may also need to see an expert in problems with the bladder called a urologist. How is this treated? Treatment depends on the cause and how bad your symptoms are. It may include: Bladder training, such as: Learning to control the urge to pee by peeing at certain times. Doing Kegel exercises. These can help you strengthen the muscles that start and stop the flow of pee. Special devices, such as: Biofeedback. This uses sensors to help you learn when to pee. Electrical stimulation. This uses electricity to make the nerves and muscles of your bladder work. A pessary. This can be put in the vagina to support the bladder. Medicines to: Treat a UTI. Stop the bladder from releasing pee at the wrong time. Calm the bladder muscles. Surgery to: Help the nerve signals that control peeing. Change the shape of your bladder. This is done only in very bad cases. Follow these instructions at home: Eating and drinking  Make changes to your diet as told. You may need to: Drink small amounts of fluid during the day rather than large amounts at one time. Take in less caffeine  or alcohol. Take steps to help treat or prevent trouble pooping. You may need to eat foods high in fiber, like beans, whole grains, and fresh fruits and vegetables. Lifestyle Lose weight if needed. Do not smoke, vape, or use nicotine or tobacco. General instructions Take your medicines only  as told. If you were given antibiotics, take them as told. Do not stop taking them even if you start to feel better. Use devices as told. If needed, wear pads to absorb pee leakage. Keep track of how much you drink, when you drink, and when you need to pee. Contact a health care provider if: Your symptoms don't get better with treatment. You can't control your bladder. You have a fever or chills. This information is not intended to replace advice given to  you by your health care provider. Make sure you discuss any questions you have with your health care provider. Document Revised: 08/20/2022 Document Reviewed: 08/20/2022 Elsevier Patient Education  2024 [employer].

## 2023-06-12 NOTE — Progress Notes (Signed)
 Subjective  CC:  Chief Complaint  Patient presents with   Urinary Frequency    Urgency. She stated that she has been dealing with this for the past 2 years.     HPI: Joanne Jackson is a 48 y.o. female who presents to the office today to address the problems listed above in the chief complaint. Discussed the use of AI scribe software for clinical note transcription with the patient, who gave verbal consent to proceed.  History of Present Illness Joanne Jackson "Joanne Jackson" is a 48 year old female who presents with urinary urgency and frequency.  She has been experiencing urinary urgency and frequency for almost two years. She describes episodes where she feels the need to urinate but is unable to void, and if she consumes excessive fluids, she struggles to hold her urine. She has had a couple of accidents following her surgery(two years ago), but none recently. No burning or pain with urination, and no changes in urine odor or appearance. She does not experience nocturia. She has not attempted any treatments for these symptoms previously.  HTN f/u: Feeling well. Taking medications w/o adverse effects. No symptoms of CHF, angina; no palpitations, sob, cp or lower extremity edema. Compliant with meds.   Brain cancer: for f/u with oncology next week. Monitoring scans. Feels well overall. Occasional headaches. On seizure prophylaxis   Assessment  1. OAB (overactive bladder)   2. Urinary urgency   3. Essential hypertension   4. 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 Nocturia absent. No dysuria or recent incontinence. Myrbetriq chosen for initial treatment. - Order urinalysis. - Prescribe Myrbetriq once daily. - Provided education on overactive bladder. - Consider urology referral if medication ineffective.  HTN: This medical condition is well controlled. There are  no signs of complications, medication side effects, or red flags. Patient is instructed to continue the current treatment plan without change in therapies or medications.   Will follow up on results after oncology visit   Follow up: October for cpe and f/u chronic problems.  Orders Placed This Encounter  Procedures   Urine Culture   Urinalysis, Routine w reflex microscopic   Meds ordered this encounter  Medications   mirabegron ER (MYRBETRIQ) 25 MG TB24 tablet    Sig: Take 1 tablet (25 mg total) by mouth daily.    Dispense:  30 tablet    Refill:  11     I reviewed the patients updated PMH, FH, and SocHx.  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) 04/30/2021    Priority: High   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HCC) 10/25/2020    Priority: High   Tubular adenoma of colon 10/24/2018    Priority: High   Essential hypertension 12/26/2017    Priority: High   Moderate obstructive sleep apnea 10/31/2021    Priority: Medium    Iron deficiency anemia 12/03/2019    Priority: Medium    Prediabetes 12/26/2017    Priority: Medium    History of gestational diabetes 10/25/2020    Priority: Low   Intramural leiomyoma of uterus 07/29/2015    Priority: Low  History of craniotomy 11/07/2022   Episode of transient neurologic symptoms 04/28/2021   Current Meds  Medication Sig   Cholecalciferol (VITAMIN D3) 50 MCG (2000 UT) TABS Take 2,000 Units by mouth daily as needed.    ferrous sulfate  324 (65 Fe) MG TBEC TAKE 1 TABLET BY MOUTH EVERY DAY (Patient taking differently: Take 324 mg by mouth daily.)   levETIRAcetam  (KEPPRA ) 1000 MG tablet Take 1 tablet (1,000 mg total) by mouth 2 (two) times daily. (Patient taking  differently: Take 1,000 mg by mouth 2 (two) times daily. Patient taking 1500 2x a Day)   levETIRAcetam  (KEPPRA ) 500 MG tablet SMARTSIG:2 Tablet(s) By Mouth Every 12 Hours   lisinopril  (ZESTRIL ) 20 MG tablet Take 1 tablet (20 mg total) by mouth daily.   melatonin 3 MG TABS tablet Take 1 tablet (3 mg total) by mouth at bedtime.   mirabegron ER (MYRBETRIQ) 25 MG TB24 tablet Take 1 tablet (25 mg total) by mouth daily.   mirtazapine  (REMERON ) 7.5 MG tablet Take 1 tablet (7.5 mg total) by mouth at bedtime.   omeprazole  (PRILOSEC) 20 MG capsule Take 20 mg by mouth daily as needed.   RESTASIS  0.05 % ophthalmic emulsion Place 1 drop into both eyes 2 (two) times daily.   traZODone  (DESYREL ) 50 MG tablet TAKE 1 TABLET BY MOUTH EVERYDAY AT BEDTIME   Allergies: Patient has no known allergies. Family History: Patient family history includes Healthy in her brother, daughter, and paternal grandfather; Heart attack (age of onset: 19) in her father; Heart disease in her father; Hypertension in her father; Rheum arthritis in her mother; Thyroid  disease in her mother. Social History:  Patient  reports that she has never smoked. She has never been exposed to tobacco smoke. She has never used smokeless tobacco. She reports that she does not drink alcohol and does not use drugs.  Review of Systems: Constitutional: Negative for fever malaise or anorexia Cardiovascular: negative for chest pain Respiratory: negative for SOB or persistent cough Gastrointestinal: negative for abdominal pain  Objective  Vitals: BP 138/83   Pulse 71   Temp 98.6 F (37 C)   Ht 5' (1.524 m)   Wt 161 lb (73 kg)   SpO2 97%   BMI 31.44 kg/m  General: no acute distress , A&Ox3 HEENT: PEERL, conjunctiva normal, neck is supple Cardiovascular:  RRR without murmur or gallop.  Respiratory:  Good breath sounds bilaterally, CTAB with normal respiratory effort Skin:  Warm, no rashes Commons side effects, risks, benefits, and alternatives  for medications and treatment plan prescribed today were discussed, and the patient expressed understanding of the given instructions. Patient is instructed to call or message via MyChart if he/she has any questions or concerns regarding our treatment plan. No barriers to understanding were identified. We discussed Red Flag symptoms and signs in detail. Patient expressed understanding regarding what to do in case of urgent or emergency type symptoms.  Medication list was reconciled, printed and provided to the patient in AVS. Patient instructions and summary information was reviewed with the patient as documented in the AVS. This note was prepared with assistance of Dragon voice recognition software. Occasional wrong-word or sound-a-like substitutions may have occurred due to the inherent limitations of voice recognition software

## 2023-06-12 NOTE — Progress Notes (Signed)
 See mychart note Dear Ms. Paule, Thank you for returning your urine sample. There is blood in your urine. Are you on your menstrual cycle? If so, then nothing to worry about. If not, then we will need to repeat this test in a week or two and if it persists, will need to send you to a urologist for further evaluation.  Please let me know.  Sincerely, Dr. Jonelle Neri

## 2023-06-13 LAB — URINE CULTURE
MICRO NUMBER:: 16425258
SPECIMEN QUALITY:: ADEQUATE

## 2023-06-13 MED ORDER — SOLIFENACIN SUCCINATE 10 MG PO TABS: 10.0000 mg | ORAL_TABLET | Freq: Every day | ORAL | 5 refills | Status: DC

## 2023-06-15 NOTE — Progress Notes (Signed)
 See mychart note Dear Ms. Grieshaber, There is no urinary tract infection. I hope the new medication helps.   Sincerely, Dr. Jonelle Neri

## 2023-06-29 ENCOUNTER — Other Ambulatory Visit: Payer: Self-pay | Admitting: Family Medicine

## 2023-07-09 ENCOUNTER — Ambulatory Visit: Admitting: Obstetrics and Gynecology

## 2023-07-16 LAB — HM DIABETES EYE EXAM

## 2023-07-17 ENCOUNTER — Encounter: Payer: Self-pay | Admitting: Family Medicine

## 2023-07-28 ENCOUNTER — Other Ambulatory Visit: Payer: Self-pay | Admitting: Family Medicine

## 2023-09-07 ENCOUNTER — Other Ambulatory Visit: Payer: Self-pay | Admitting: Family Medicine

## 2023-09-08 ENCOUNTER — Encounter: Payer: Self-pay | Admitting: Family Medicine

## 2023-09-09 ENCOUNTER — Other Ambulatory Visit: Payer: Self-pay

## 2023-09-09 MED ORDER — SOLIFENACIN SUCCINATE 10 MG PO TABS: 10.0000 mg | ORAL_TABLET | Freq: Every day | ORAL | 3 refills | Status: AC

## 2023-09-19 NOTE — Progress Notes (Signed)
 Office Visit Note  Patient: Joanne Jackson             Date of Birth: August 27, 1975           MRN: [id number]             PCP: Jodie Lavern CROME, MD Referring: Jodie Lavern CROME, MD Visit Date: 10/03/2023 Occupation: @GUAROCC @  Subjective:  Dry mouth and dry eyes  History of Present Illness: Joanne Jackson is a 48 y.o. female with Sjogren's disease.  She returns today after her last visit in February 2025.  She states she has been using over-the-counter products for dry mouth and dry eyes.  She states her symptoms are manageable.  She has been going to the dentist on a regular bas  She denies any history of joint swelling.  She denies any morning stiffness or joint discomfort.  She states knee joint discomfort is improved.  She was evaluated recently by her oncologist and she is in remission the patient.    Activities of Daily Living:  Patient reports morning stiffness for 0 minutes.   Patient Denies nocturnal pain.  Difficulty dressing/grooming: Denies Difficulty climbing stairs: Denies Difficulty getting out of chair: Denies Difficulty using hands for taps, buttons, cutlery, and/or writing: Denies  Review of Systems  Constitutional:  Negative for fatigue.  HENT:  Positive for mouth dryness. Negative for mouth sores.   Eyes:  Positive for dryness.  Respiratory:  Negative for shortness of breath.   Cardiovascular:  Negative for chest pain and palpitations.  Gastrointestinal:  Negative for blood in stool, constipation and diarrhea.  Endocrine: Negative for increased urination.  Genitourinary:  Negative for involuntary urination.  Musculoskeletal:  Negative for joint pain, gait problem, joint pain, joint swelling, myalgias, muscle weakness, morning stiffness, muscle tenderness and myalgias.  Skin:  Negative for color change, rash, hair loss and sensitivity to sunlight.  Allergic/Immunologic: Negative for susceptible to infections.  Neurological:  Negative for dizziness and headaches.   Hematological:  Negative for swollen glands.  Psychiatric/Behavioral:  Negative for depressed mood and sleep disturbance. The patient is not nervous/anxious.     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) 04/30/2021   Episode of transient neurologic symptoms 04/28/2021   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HCC) 10/25/2020   History of gestational diabetes 10/25/2020   Iron deficiency anemia 12/03/2019   Tubular adenoma of colon 10/24/2018   Essential hypertension 12/26/2017   Prediabetes 12/26/2017   Intramural leiomyoma of uterus 07/29/2015    Past Medical History:  Diagnosis Date   Brain cancer (HCC)    Glioma (HCC) 05/18/2021   Hypertension    Pre-diabetes    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HCC)     Family History  Problem Relation Age of Onset   Thyroid  disease Mother    Rheum arthritis Mother    Heart attack Father 38   Hypertension Father    Heart disease Father    Healthy Brother    Healthy Paternal Grandfather    Healthy Daughter    Past Surgical History:  Procedure Laterality Date   BRAIN SURGERY  05/2021   tumor removal   CESAREAN SECTION     DILATION AND CURETTAGE, DIAGNOSTIC /  THERAPEUTIC     Social History   Social History Narrative   Right handed   Caffeine -none   Lives with husband and daughter   Immunization History  Administered Date(s) Administered   Influenza, Seasonal, Injecte, Preservative Fre 11/21/2022   Influenza,inj,Quad PF,6+ Mos 11/11/2015, 10/24/2018, 12/02/2019, 10/25/2020, 11/14/2021   Influenza-Unspecified 11/20/2022   Moderna Sars-Covid-2 Vaccination 05/29/2019, 06/19/2019    PFIZER(Purple Top)SARS-COV-2 Vaccination 01/19/2020   Tdap 10/14/2015     Objective: Vital Signs: BP 117/79 (BP Location: Left Arm, Patient Position: Sitting, Cuff Size: Normal)   Pulse 66   Resp 14   Ht 5' (1.524 m)   Wt 160 lb (72.6 kg)   BMI 31.25 kg/m    Physical Exam Vitals and nursing note reviewed.  Constitutional:      Appearance: She is well-developed.  HENT:     Head: Normocephalic and atraumatic.  Eyes:     Conjunctiva/sclera: Conjunctivae normal.  Cardiovascular:     Rate and Rhythm: Normal rate and regular rhythm.     Heart sounds: Normal heart sounds.  Pulmonary:     Effort: Pulmonary effort is normal.     Breath sounds: Normal breath sounds.  Abdominal:     General: Bowel sounds are normal.     Palpations: Abdomen is soft.  Musculoskeletal:     Cervical back: Normal range of motion.  Lymphadenopathy:     Cervical: No cervical adenopathy.  Skin:    General: Skin is warm and dry.     Capillary Refill: Capillary refill takes less than 2 seconds.  Neurological:     Mental Status: She is alert and oriented to person, place, and time.  Psychiatric:        Behavior: Behavior normal.      Musculoskeletal Exam: Cervical, thoracic and lumbar spine were in good range of motion.  Shoulders, elbows, wrist joints, MCPs PIPs and DIPs were in good range of motion with no synovitis.  Hip joints and knee joints in good range of motion without any warmth swelling or effusion.  CDAI Exam: CDAI Score: -- Patient Global: --; Provider Global: -- Swollen: --; Tender: -- Joint Exam 10/03/2023   No joint exam has been documented for this visit   There is currently no information documented on the homunculus. Go to the Rheumatology activity and complete the homunculus joint exam.  Investigation: No additional findings.  Imaging: CT Head Wo Contrast Result Date: 10/02/2023 EXAM: CT HEAD WITHOUT CONTRAST 10/02/2023 08:54:06 AM TECHNIQUE: CT of the head was performed  without the administration of intravenous contrast. Automated exposure control, iterative reconstruction, and/or weight based adjustment of the mA/kV was utilized to reduce the radiation dose to as low as reasonably achievable. COMPARISON: Head CT 11/05/2022 and MRI 08/01/2022. CLINICAL HISTORY: Polytrauma, blunt. Triage note: Patient fell backwards off her barstool and hit her head on her breakfast table. She states -LOC and -Anticoagulants. She has dried blood on back of scalp. No active bleeding. Laceration noted mid occiput. Hx of brain cancer- glioma, tumor removal(rad/chemo) two years ago. FINDINGS: BRAIN AND VENTRICLES: A resection cavity in the right frontal lobe subjacent to a craniotomy is similar to the prior CT in this patient with a history of right frontal astrocytoma. Hypodensity in the white matter adjacent to the resection cavity as well as in the left frontal white matter is similar to the prior CT. No acute cortically based infarct, cranial hemorrhage, midline shift, hydrocephalus, or extraaxial fluid collection is identified. ORBITS: Unremarkable. SINUSES: Mild mucosal thickening in  the included paranasal sinuses. SOFT TISSUES AND SKULL: Bifrontal craniotomy. No acute fracture. Left occipital scalp laceration. IMPRESSION: 1. No evidence of acute intracranial injury. 2. Stable post-treatment appearance of the brain on this noncontrast CT. Electronically signed by: Dasie Hamburg MD 10/02/2023 09:04 AM EDT RP Workstation: [id number]    Recent Labs: Lab Results  Component Value Date   WBC 4.6 04/04/2023   HGB 14.5 04/04/2023   PLT 207 04/04/2023   NA 138 04/04/2023   K 4.0 04/04/2023   CL 104 04/04/2023   CO2 24 04/04/2023   GLUCOSE 93 04/04/2023   BUN 8 04/04/2023   CREATININE 0.46 (L) 04/04/2023   BILITOT 0.7 04/04/2023   ALKPHOS 54 01/22/2023   AST 23 04/04/2023   ALT 31 (H) 04/04/2023   PROT 7.4 04/04/2023   ALBUMIN 4.4 01/22/2023   CALCIUM 9.7 04/04/2023   GFRAA 138  11/24/2019    Speciality Comments: No specialty comments available.  Procedures:  No procedures performed Allergies: Patient has no known allergies.   Assessment / Plan:     Visit Diagnoses: 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HCC) - ANA 1:160 NS, RF<14, sed rate 26, Ro+: -Patient continues to have dry mouth and dry eye symptoms which are manageable with over-the-counter products.  She has been going to the dentist on a regular basis and denies any dental cavities.  With oral hygiene was discussed.  I will obtain labs today.  We will contact her once the lab results are available.  Plan: Protein / creatinine ratio, urine, CBC with Differential/Platelet, Comprehensive metabolic panel with GFR, Anti-DNA antibody, double-stranded, C3 and C4, Sedimentation rate, ANA, Sjogrens syndrome-A extractable nuclear antibody.  I advised her to contact me if she develops any new symptoms.  Increased risk of ILD, arrhythmias and association of lymphoma and Sjogren's was again discussed.  Joint stiffness-improved.  Chronic pain of both knees -she had no discomfort range of motion of her knee joints.  No warmth swelling or effusion was noted.  X-rays of both knee joints were unremarkable on 11/25/18.  Other fatigue-improving.  Moderate obstructive sleep apnea - she is followed by Dr. Neysa.  Vitamin D  deficiency-vitamin D  was normal in August 2024.  Prediabetes  Essential hypertension-blood pressure was normal today at 117/79.  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  She had surgery at [facility] followed by chemotherapy and radiation therapy.  She is still followed at [facility].  Tubular adenoma of colon  Family history of rheumatoid arthritis-mother  Orders: Orders Placed This Encounter  Procedures   Protein / creatinine ratio, urine   CBC with Differential/Platelet   Comprehensive metabolic panel with GFR   Anti-DNA antibody, double-stranded   C3 and C4   Sedimentation  rate   ANA   Sjogrens syndrome-A extractable nuclear antibody   No orders of the defined types were placed in this encounter.    Follow-Up Instructions: Return in about 6 months (around 04/04/2024) for Sjogren's.   Maya Nash, MD  Note - This record has been created using Animal nutritionist.  Chart creation errors have been sought, but may not always  have been located. Such creation errors do not reflect on  the standard of medical care.

## 2023-10-02 ENCOUNTER — Other Ambulatory Visit: Payer: Self-pay

## 2023-10-02 ENCOUNTER — Emergency Department (HOSPITAL_BASED_OUTPATIENT_CLINIC_OR_DEPARTMENT_OTHER)
Admission: EM | Admit: 2023-10-02 | Discharge: 2023-10-02 | Disposition: A | Discharge status: Home / Self Care | Attending: Emergency Medicine | Admitting: Emergency Medicine

## 2023-10-02 ENCOUNTER — Emergency Department (HOSPITAL_BASED_OUTPATIENT_CLINIC_OR_DEPARTMENT_OTHER)

## 2023-10-02 ENCOUNTER — Encounter (HOSPITAL_BASED_OUTPATIENT_CLINIC_OR_DEPARTMENT_OTHER): Payer: Self-pay

## 2023-10-02 DIAGNOSIS — S0101XA Laceration without foreign body of scalp, initial encounter: Secondary | ICD-10-CM | POA: Diagnosis not present

## 2023-10-02 DIAGNOSIS — W01198A Fall on same level from slipping, tripping and stumbling with subsequent striking against other object, initial encounter: Secondary | ICD-10-CM | POA: Diagnosis not present

## 2023-10-02 DIAGNOSIS — S0990XA Unspecified injury of head, initial encounter: Secondary | ICD-10-CM | POA: Diagnosis present

## 2023-10-02 MED ORDER — ACETAMINOPHEN 500 MG PO TABS
1000.0000 mg | ORAL_TABLET | Freq: Once | ORAL | Status: AC
  Administered 2023-10-02: 1000 mg via ORAL
  Filled 2023-10-02: qty 2

## 2023-10-02 NOTE — ED Provider Notes (Signed)
 Joanne Jackson EMERGENCY DEPARTMENT AT [facility] Provider Note   CSN: [id number] Arrival date & time: 10/02/23  9190     Patient presents with: Head Injury and Laceration   Joanne Jackson is a 48 y.o. female.   Patient here after she tripped and fell and hit the back of her head on breakfast table.  She has got a history of astrocytoma status post brain surgery radiation and chemotherapy.  She is actually supposed to have MRI this morning for postop follow-up.  She is having a mild headache but she denies loss of consciousness.  No neck pain.  No pain elsewhere.  She is ambulatory since the fall.  She lost her footing today and tripped over something and hit her head.  Denies any seizures.  The history is provided by the patient.       Prior to Admission medications   Medication Sig Start Date End Date Taking? Authorizing Provider  Cholecalciferol (VITAMIN D3) 50 MCG (2000 UT) TABS Take 2,000 Units by mouth daily as needed.     [provider]  ferrous sulfate  324 (65 Fe) MG TBEC TAKE 1 TABLET BY MOUTH EVERY DAY Patient taking differently: Take 324 mg by mouth daily. 09/06/20   Jodie Lavern CROME, MD  levETIRAcetam  (KEPPRA ) 1000 MG tablet Take 1 tablet (1,000 mg total) by mouth 2 (two) times daily. Patient taking differently: Take 1,000 mg by mouth 2 (two) times daily. Patient taking 1500 2x a Day 11/05/22   Ula Prentice SAUNDERS, MD  levETIRAcetam  (KEPPRA ) 500 MG tablet SMARTSIG:2 Tablet(s) By Mouth Every 12 Hours 02/06/23   [provider]  lisinopril  (ZESTRIL ) 20 MG tablet TAKE 1 TABLET BY MOUTH EVERY DAY 07/30/23   Jodie Lavern CROME, MD  melatonin 3 MG TABS tablet Take 1 tablet (3 mg total) by mouth at bedtime. 06/28/22   Vaslow, Zachary K, MD  mirtazapine  (REMERON ) 7.5 MG tablet Take 1 tablet (7.5 mg total) by mouth at bedtime. 01/25/22   Vaslow, Zachary K, MD  omeprazole  (PRILOSEC) 20 MG capsule Take 20 mg by mouth daily as needed. 09/11/21   [provider]  RESTASIS  0.05  % ophthalmic emulsion Place 1 drop into both eyes 2 (two) times daily. 01/25/20   [provider]  solifenacin  (VESICARE ) 10 MG tablet Take 1 tablet (10 mg total) by mouth daily. 09/09/23   Jodie Lavern CROME, MD  traZODone  (DESYREL ) 50 MG tablet TAKE 1 TABLET BY MOUTH EVERYDAY AT BEDTIME 07/23/22   Vaslow, Zachary K, MD    Allergies: Patient has no known allergies.    Review of Systems  Updated Vital Signs BP (!) 147/98 (BP Location: Right Arm)   Pulse 82   Temp 99.2 F (37.3 C) (Oral)   Resp 18   SpO2 98%   Physical Exam Vitals and nursing note reviewed.  Constitutional:      General: She is not in acute distress.    Appearance: She is well-developed.  HENT:     Head:     Comments: She has about of 2 to 3 cm laceration to her occiput that is hemostatic Eyes:     Extraocular Movements: Extraocular movements intact.     Conjunctiva/sclera: Conjunctivae normal.     Pupils: Pupils are equal, round, and reactive to light.  Cardiovascular:     Rate and Rhythm: Normal rate and regular rhythm.     Heart sounds: No murmur heard. Pulmonary:     Effort: Pulmonary effort is normal. No respiratory distress.  Breath sounds: Normal breath sounds.  Abdominal:     Palpations: Abdomen is soft.     Tenderness: There is no abdominal tenderness.  Musculoskeletal:        General: No swelling.     Cervical back: Neck supple.  Skin:    General: Skin is warm and dry.     Capillary Refill: Capillary refill takes less than 2 seconds.  Neurological:     General: No focal deficit present.     Mental Status: She is alert.     Cranial Nerves: No cranial nerve deficit.     Sensory: No sensory deficit.     Motor: No weakness.     Coordination: Coordination normal.     Comments: Strength and sensation grossly intact  Psychiatric:        Mood and Affect: Mood normal.     (all labs ordered are listed, but only abnormal results are displayed) Labs Reviewed - No data to  display  EKG: None  Radiology: CT Head Wo Contrast Result Date: 10/02/2023 EXAM: CT HEAD WITHOUT CONTRAST 10/02/2023 08:54:06 AM TECHNIQUE: CT of the head was performed without the administration of intravenous contrast. Automated exposure control, iterative reconstruction, and/or weight based adjustment of the mA/kV was utilized to reduce the radiation dose to as low as reasonably achievable. COMPARISON: Head CT 11/05/2022 and MRI 08/01/2022. CLINICAL HISTORY: Polytrauma, blunt. Triage note: Patient fell backwards off her barstool and hit her head on her breakfast table. She states -LOC and -Anticoagulants. She has dried blood on back of scalp. No active bleeding. Laceration noted mid occiput. Hx of brain cancer- glioma, tumor removal(rad/chemo) two years ago. FINDINGS: BRAIN AND VENTRICLES: A resection cavity in the right frontal lobe subjacent to a craniotomy is similar to the prior CT in this patient with a history of right frontal astrocytoma. Hypodensity in the white matter adjacent to the resection cavity as well as in the left frontal white matter is similar to the prior CT. No acute cortically based infarct, cranial hemorrhage, midline shift, hydrocephalus, or extraaxial fluid collection is identified. ORBITS: Unremarkable. SINUSES: Mild mucosal thickening in the included paranasal sinuses. SOFT TISSUES AND SKULL: Bifrontal craniotomy. No acute fracture. Left occipital scalp laceration. IMPRESSION: 1. No evidence of acute intracranial injury. 2. Stable post-treatment appearance of the brain on this noncontrast CT. Electronically signed by: Dasie Hamburg MD 10/02/2023 09:04 AM EDT RP Workstation: [id number]     .Laceration Repair  Date/Time: 10/02/2023 9:11 AM  Performed by: Ruthe Cornet, DO Authorized by: Ruthe Cornet, DO   Consent:    Consent obtained:  Verbal   Consent given by:  Patient   Risks, benefits, and alternatives were discussed: yes     Risks discussed:  Infection, need for  additional repair, nerve damage, poor wound healing, tendon damage, vascular damage, poor cosmetic result, pain and retained foreign body   Alternatives discussed:  No treatment Universal protocol:    Procedure explained and questions answered to patient or proxy's satisfaction: no     Relevant documents present and verified: no     Patient identity confirmed:  Verbally with patient Anesthesia:    Anesthesia method:  None Laceration details:    Location:  Scalp   Scalp location:  Occipital   Length (cm):  2   Depth (mm):  1 Pre-procedure details:    Preparation:  Patient was prepped and draped in usual sterile fashion and imaging obtained to evaluate for foreign bodies Exploration:    Limited defect created (wound  extended): no     Imaging outcome: foreign body not noted     Wound exploration: wound explored through full range of motion and entire depth of wound visualized     Wound extent: areolar tissue not violated, fascia not violated, no foreign body, no signs of injury, no nerve damage, no tendon damage, no underlying fracture and no vascular damage   Treatment:    Area cleansed with:  Shur-Clens   Amount of cleaning:  Extensive Skin repair:    Repair method:  Sutures   Suture size:  5-0   Suture material:  Fast-absorbing gut   Number of sutures:  2 Approximation:    Approximation:  Close Repair type:    Repair type:  Simple Post-procedure details:    Dressing:  Open (no dressing)   Procedure completion:  Tolerated    Medications Ordered in the ED  acetaminophen  (TYLENOL ) tablet 1,000 mg (1,000 mg Oral Given 10/02/23 0912)                                    Medical Decision Making Amount and/or Complexity of Data Reviewed Radiology: ordered.  Risk OTC drugs.   Joanne Jackson is here with fall, head laceration.  Patient here after she tripped and fell hit the back of her head.  She is currently undergoing treatment for astrocytoma.  She has had surgery radiation  and chemotherapy.  She is supposed to get an MRI of her brain [facility] at [facility] this morning.  But fell hit the back of her head.  Did not lose consciousness.  Is not having any neck pain or pain elsewhere.  No midline spinal pain.  She is neurologically intact and at her baseline.  Seems like a mechanical fall.  Head CT was obtained that showed no acute process no head bleed per radiology report.  2 sutures were placed given that patient needs to have MRI today.  Wound care instructions given.  States tetanus shot is up-to-date.  Follow-up with primary care return if symptoms worsen.  Wound care instructions given.  Patient discharged.  She is not having any neck pain or pain elsewhere.  No need for any other imaging at this time.  This chart was dictated using voice recognition software.  Despite best efforts to proofread,  errors can occur which can change the documentation meaning.      Final diagnoses:  Laceration of scalp without foreign body, initial encounter    ED Discharge Orders     None          Ruthe Cornet, DO 10/02/23 [phone]

## 2023-10-02 NOTE — ED Triage Notes (Signed)
 Patient fell backwards off her barstool and hit her head on her breakfast table. She states -LOC and -Anticoagulants.   She has dried blood on back of scalp. No active bleeding. Laceration noted mid occiput.

## 2023-10-02 NOTE — ED Notes (Signed)
 DC paperwork given and verbally understood.... Pt was DC d by the Provider before Pt could be assessed.SABRASABRA

## 2023-10-02 NOTE — Discharge Instructions (Signed)
 Please keep your area clean and dry.  You can wash your hair out today but I recommend not scrubbing aggressively with soap and water for a couple of days.

## 2023-10-03 ENCOUNTER — Encounter: Payer: Self-pay | Admitting: Rheumatology

## 2023-10-03 ENCOUNTER — Ambulatory Visit: Payer: 59 | Attending: Rheumatology | Admitting: Rheumatology

## 2023-10-03 VITALS — BP 117/79 | HR 66 | Resp 14 | Ht 60.0 in | Wt 160.0 lb

## 2023-10-03 DIAGNOSIS — Z8261 Family history of arthritis: Secondary | ICD-10-CM

## 2023-10-03 DIAGNOSIS — G8929 Other chronic pain: Secondary | ICD-10-CM

## 2023-10-03 DIAGNOSIS — E559 Vitamin D deficiency, unspecified: Secondary | ICD-10-CM

## 2023-10-03 DIAGNOSIS — M25562 Pain in left knee: Secondary | ICD-10-CM

## 2023-10-03 DIAGNOSIS — M3501 Sicca syndrome with keratoconjunctivitis: Secondary | ICD-10-CM

## 2023-10-03 DIAGNOSIS — R7303 Prediabetes: Secondary | ICD-10-CM

## 2023-10-03 DIAGNOSIS — M2560 Stiffness of unspecified joint, not elsewhere classified: Secondary | ICD-10-CM

## 2023-10-03 DIAGNOSIS — D126 Benign neoplasm of colon, unspecified: Secondary | ICD-10-CM

## 2023-10-03 DIAGNOSIS — M25561 Pain in right knee: Secondary | ICD-10-CM | POA: Diagnosis not present

## 2023-10-03 DIAGNOSIS — R5383 Other fatigue: Secondary | ICD-10-CM

## 2023-10-03 DIAGNOSIS — C719 Malignant neoplasm of brain, unspecified: Secondary | ICD-10-CM

## 2023-10-03 DIAGNOSIS — G4733 Obstructive sleep apnea (adult) (pediatric): Secondary | ICD-10-CM

## 2023-10-03 DIAGNOSIS — I10 Essential (primary) hypertension: Secondary | ICD-10-CM

## 2023-10-04 ENCOUNTER — Other Ambulatory Visit: Payer: Self-pay

## 2023-10-04 ENCOUNTER — Encounter: Payer: Self-pay | Admitting: Internal Medicine

## 2023-10-06 ENCOUNTER — Encounter: Payer: Self-pay | Admitting: Internal Medicine

## 2023-10-06 LAB — COMPREHENSIVE METABOLIC PANEL WITH GFR
AG Ratio: 1.7 (calc) (ref 1.0–2.5)
ALT: 24 U/L (ref 6–29)
AST: 19 U/L (ref 10–35)
Albumin: 4.3 g/dL (ref 3.6–5.1)
Alkaline phosphatase (APISO): 52 U/L (ref 31–125)
BUN/Creatinine Ratio: 18 (calc) (ref 6–22)
BUN: 9 mg/dL (ref 7–25)
CO2: 27 mmol/L (ref 20–32)
Calcium: 9.3 mg/dL (ref 8.6–10.2)
Chloride: 103 mmol/L (ref 98–110)
Creat: 0.49 mg/dL — ABNORMAL LOW (ref 0.50–0.99)
Globulin: 2.5 g/dL (ref 1.9–3.7)
Glucose, Bld: 198 mg/dL — ABNORMAL HIGH (ref 65–139)
Potassium: 3.8 mmol/L (ref 3.5–5.3)
Sodium: 137 mmol/L (ref 135–146)
Total Bilirubin: 0.8 mg/dL (ref 0.2–1.2)
Total Protein: 6.8 g/dL (ref 6.1–8.1)
eGFR: 118 mL/min/1.73m2 (ref >=60)

## 2023-10-06 LAB — CBC WITH DIFFERENTIAL/PLATELET
Absolute Lymphocytes: 1155 {cells}/uL (ref 850–3900)
Absolute Monocytes: 270 {cells}/uL (ref 200–950)
Basophils Absolute: 0 {cells}/uL (ref 0–200)
Basophils Relative: 0 %
Eosinophils Absolute: 72 {cells}/uL (ref 15–500)
Eosinophils Relative: 1.3 %
HCT: 41.3 % (ref 35.0–45.0)
Hemoglobin: 13.8 g/dL (ref 11.7–15.5)
MCH: 30.5 pg (ref 27.0–33.0)
MCHC: 33.4 g/dL (ref 32.0–36.0)
MCV: 91.2 fL (ref 80.0–100.0)
MPV: 9.2 fL (ref 7.5–12.5)
Monocytes Relative: 4.9 %
Neutro Abs: 4004 {cells}/uL (ref 1500–7800)
Neutrophils Relative %: 72.8 %
Platelets: 200 Thousand/uL (ref 140–400)
RBC: 4.53 Million/uL (ref 3.80–5.10)
RDW: 11.9 % (ref 11.0–15.0)
Total Lymphocyte: 21 %
WBC: 5.5 Thousand/uL (ref 3.8–10.8)

## 2023-10-06 LAB — SJOGRENS SYNDROME-A EXTRACTABLE NUCLEAR ANTIBODY: SSA (Ro) (ENA) Antibody, IgG: >8 AI — AB

## 2023-10-06 LAB — ANTI-NUCLEAR AB-TITER (ANA TITER): ANA Titer 1: 1:80 {titer} — ABNORMAL HIGH

## 2023-10-06 LAB — ANA: Anti Nuclear Antibody (ANA): POSITIVE — AB

## 2023-10-06 LAB — C3 AND C4
C3 Complement: 139 mg/dL (ref 83–193)
C4 Complement: 19 mg/dL (ref 15–57)

## 2023-10-06 LAB — ANTI-DNA ANTIBODY, DOUBLE-STRANDED: ds DNA Ab: <1 [IU]/mL

## 2023-10-06 LAB — PROTEIN / CREATININE RATIO, URINE
Creatinine, Urine: 21 mg/dL (ref 20–275)
Total Protein, Urine: <4 mg/dL — ABNORMAL LOW (ref 5–24)

## 2023-10-06 LAB — SEDIMENTATION RATE: Sed Rate: 14 mm/h (ref 0–20)

## 2023-10-08 ENCOUNTER — Ambulatory Visit: Payer: Self-pay | Admitting: Physician Assistant

## 2023-11-07 ENCOUNTER — Other Ambulatory Visit: Payer: Self-pay | Admitting: Family Medicine

## 2023-11-07 ENCOUNTER — Other Ambulatory Visit: Payer: Self-pay | Admitting: Neurology

## 2023-12-02 ENCOUNTER — Other Ambulatory Visit: Payer: Self-pay | Admitting: Family Medicine

## 2023-12-02 DIAGNOSIS — Z1231 Encounter for screening mammogram for malignant neoplasm of breast: Secondary | ICD-10-CM

## 2023-12-09 ENCOUNTER — Encounter

## 2023-12-09 DIAGNOSIS — Z1231 Encounter for screening mammogram for malignant neoplasm of breast: Secondary | ICD-10-CM

## 2023-12-22 ENCOUNTER — Encounter: Payer: Self-pay | Admitting: Family Medicine

## 2023-12-23 ENCOUNTER — Encounter

## 2023-12-24 ENCOUNTER — Encounter: Payer: Self-pay | Admitting: Obstetrics and Gynecology

## 2023-12-24 ENCOUNTER — Encounter: Payer: Self-pay | Admitting: Gastroenterology

## 2023-12-24 NOTE — Telephone Encounter (Signed)
 Noted, Patient going to give the antibiotics a few more days before making another appointment.

## 2023-12-31 ENCOUNTER — Encounter: Payer: Self-pay | Admitting: Family Medicine

## 2023-12-31 ENCOUNTER — Ambulatory Visit: Payer: 59 | Admitting: Obstetrics and Gynecology

## 2023-12-31 ENCOUNTER — Ambulatory Visit: Admitting: Gastroenterology

## 2023-12-31 NOTE — Progress Notes (Deleted)
 48 y.o. G50P1001 Married Asian female here for annual exam.    PCP: Jodie Lavern CROME, MD   No LMP recorded.           Sexually active: Yes.    The current method of family planning is condoms.    Menopausal hormone therapy:  n/a Exercising: {yes no:314532}  {types:19826} Smoker:  no  OB History  Gravida Para Term Preterm AB Living  1 1 1   1   SAB IAB Ectopic Multiple Live Births          # Outcome Date GA Lbr Len/2nd Weight Sex Type Anes PTL Lv  1 Term              HEALTH MAINTENANCE: Last 2 paps:  12/25/22 neg HR HPV neg, 12/19/21 neg History of abnormal Pap or positive HPV:  no Mammogram:   12/20/22 Breast Density Cat C, BIRADS Cat 1 neg. Scheduled 01/01/24  Colonoscopy:  09/23/18 Bone Density:  n/a  Result  n/a   Immunization History  Administered Date(s) Administered   Fluzone Influenza virus vaccine,trivalent (IIV3), split virus 11/11/2015   Influenza, Mdck, Trivalent,PF 6+ MOS(egg free) 10/22/2023   Influenza, Seasonal, Injecte, Preservative Fre 11/21/2022   Influenza,inj,Quad PF,6+ Mos 11/11/2015, 10/24/2018, 12/02/2019, 10/25/2020, 11/14/2021   Influenza-Unspecified 11/20/2022   Moderna Sars-Covid-2 Vaccination 05/29/2019, 06/19/2019   PFIZER(Purple Top)SARS-COV-2 Vaccination 01/19/2020   Tdap 10/14/2015      reports that she has never smoked. She has never been exposed to tobacco smoke. She has never used smokeless tobacco. She reports that she does not drink alcohol and does not use drugs.  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      ferrous sulfate  324 (65 Fe) MG TBEC TAKE 1 TABLET BY MOUTH EVERY DAY 90 tablet 1   levETIRAcetam  (KEPPRA ) 1000 MG tablet TAKE 1 TABLET BY MOUTH TWICE A DAY 180 tablet 3   levETIRAcetam  (KEPPRA ) 500 MG tablet SMARTSIG:2 Tablet(s) By Mouth Every 12 Hours (Patient taking differently: TOTAL OF 1500mg  BID)     lisinopril  (ZESTRIL ) 20 MG tablet TAKE 1 TABLET BY MOUTH EVERY DAY 90 tablet 3   melatonin 3 MG TABS tablet Take 1 tablet (3 mg total) by mouth at bedtime. 60 tablet 3   mirtazapine  (REMERON ) 7.5 MG tablet Take 1 tablet (7.5 mg total) by mouth at bedtime.     RESTASIS  0.05 % ophthalmic emulsion Place 1 drop into both eyes 2 (two) times daily.     solifenacin  (VESICARE ) 10 MG tablet Take 1 tablet (10 mg total) by mouth daily. 90 tablet 3   traZODone  (DESYREL ) 50 MG tablet TAKE 1 TABLET BY MOUTH EVERYDAY AT BEDTIME 90 tablet 2   No current facility-administered medications for this visit.    ALLERGIES: Patient has no known allergies.  Family History  Problem Relation Age of Onset   Thyroid  disease Mother    Rheum arthritis Mother    Heart attack Father 60   Hypertension  Father    Heart disease Father    Healthy Brother    Healthy Paternal Grandfather    Healthy Daughter     Review of Systems  PHYSICAL EXAM:  There were no vitals taken for this visit.    General appearance: alert, cooperative and appears stated age Head: normocephalic, without obvious abnormality, atraumatic Neck: no adenopathy, supple, symmetrical, trachea midline and thyroid  normal to inspection and palpation Lungs: clear to auscultation bilaterally Breasts: normal appearance, no masses or tenderness, No nipple retraction or dimpling, No nipple discharge or bleeding, No axillary adenopathy Heart: regular rate and rhythm Abdomen: soft, non-tender; no masses, no organomegaly Extremities: extremities normal, atraumatic, no cyanosis or edema Skin: skin color,  texture, turgor normal. No rashes or lesions Lymph nodes: cervical, supraclavicular, and axillary nodes normal. Neurologic: grossly normal  Pelvic: External genitalia:  no lesions              No abnormal inguinal nodes palpated.              Urethra:  normal appearing urethra with no masses, tenderness or lesions              Bartholins and Skenes: normal                 Vagina: normal appearing vagina with normal color and discharge, no lesions              Cervix: no lesions              Pap taken: {yes no:314532} Bimanual Exam:  Uterus:  normal size, contour, position, consistency, mobility, non-tender              Adnexa: no mass, fullness, tenderness              Rectal exam: {yes no:314532}.  Confirms.              Anus:  normal sphincter tone, no lesions  Chaperone was present for exam:  {BSCHAPERONE:31226::Emily F, CMA}  ASSESSMENT: Well woman visit with gynecologic exam.  PHQ-2-9: ***  ***  PLAN: Mammogram screening discussed. Self breast awareness reviewed. Pap and HRV collected:  {yes no:314532} Guidelines for Calcium, Vitamin D , regular exercise program including cardiovascular and weight bearing exercise. Medication refills:  *** {LABS (Optional):23779} Follow up:  ***    Additional counseling given.  {yes [id number]. ***  total time was spent for this patient encounter, including preparation, face-to-face counseling with the patient, coordination of care, and documentation of the encounter in addition to doing the well woman visit with gynecologic exam.

## 2024-01-01 ENCOUNTER — Ambulatory Visit
Admission: RE | Admit: 2024-01-01 | Discharge: 2024-01-01 | Disposition: A | Discharge status: Home / Self Care | Source: Ambulatory Visit

## 2024-01-01 ENCOUNTER — Encounter: Payer: Self-pay | Admitting: Internal Medicine

## 2024-01-01 ENCOUNTER — Other Ambulatory Visit: Payer: Self-pay

## 2024-01-01 ENCOUNTER — Telehealth: Payer: Self-pay

## 2024-01-01 DIAGNOSIS — Z1231 Encounter for screening mammogram for malignant neoplasm of breast: Secondary | ICD-10-CM

## 2024-01-01 NOTE — Telephone Encounter (Signed)
 Forwarded to PCP.

## 2024-01-07 ENCOUNTER — Encounter: Payer: Self-pay | Admitting: Obstetrics and Gynecology

## 2024-01-08 ENCOUNTER — Other Ambulatory Visit: Payer: Self-pay | Admitting: Family Medicine

## 2024-01-08 DIAGNOSIS — R928 Other abnormal and inconclusive findings on diagnostic imaging of breast: Secondary | ICD-10-CM

## 2024-01-09 NOTE — Telephone Encounter (Signed)
 Spoke with patient. AEX rescheduled to 02/11/24 at 1400. Patient declined earlier time offered due to scheduling conflict.   Routing to provider for final review. Patient is agreeable to disposition. Will close encounter.

## 2024-01-09 NOTE — Telephone Encounter (Signed)
I sent her a message on my chart

## 2024-01-16 ENCOUNTER — Inpatient Hospital Stay: Admission: RE | Admit: 2024-01-16 | Discharge: 2024-01-16 | Attending: Family Medicine | Admitting: Family Medicine

## 2024-01-16 DIAGNOSIS — R928 Other abnormal and inconclusive findings on diagnostic imaging of breast: Secondary | ICD-10-CM

## 2024-02-11 ENCOUNTER — Ambulatory Visit: Admitting: Obstetrics and Gynecology

## 2024-02-11 ENCOUNTER — Encounter: Payer: Self-pay | Admitting: Obstetrics and Gynecology

## 2024-02-11 ENCOUNTER — Other Ambulatory Visit (HOSPITAL_COMMUNITY)
Admission: RE | Admit: 2024-02-11 | Discharge: 2024-02-11 | Disposition: A | Discharge status: Home / Self Care | Source: Ambulatory Visit | Attending: Obstetrics and Gynecology | Admitting: Obstetrics and Gynecology

## 2024-02-11 VITALS — BP 124/80 | HR 83 | Ht 61.0 in | Wt 158.0 lb

## 2024-02-11 DIAGNOSIS — Z124 Encounter for screening for malignant neoplasm of cervix: Secondary | ICD-10-CM | POA: Insufficient documentation

## 2024-02-11 DIAGNOSIS — Z1331 Encounter for screening for depression: Secondary | ICD-10-CM | POA: Diagnosis not present

## 2024-02-11 DIAGNOSIS — Z01419 Encounter for gynecological examination (general) (routine) without abnormal findings: Secondary | ICD-10-CM

## 2024-02-11 NOTE — Progress Notes (Unsigned)
 "  49 y.o. G73P1001 Married Asian female here for annual exam. Some sort of lump on L breast.   Patient had a recall on her screening mammogram 01/01/24 showing a possible left breast mass.  Dx mammogram and left breast US  01/16/24 showed a benign left breast cyst 1.4 x 1.1 x 1.2 cm.  Recommendation was made for screening mammogram in one year.   Hx oligomenorrhea since doing chemotherapy for astrocytoma.   5 - 6 periods a year.  LMP December.  Prior LMP was September.   Wants a yearly pap.  She understands insurance may not pay.   Treated for diverticulitis in November, 2025.  Went to urgent care.   Feeling better.   PCP: Jodie Lavern CROME, MD   Patient's last menstrual period was 02/05/2024 (approximate).     Period Duration (Days): 5 Period Pattern: (!) Irregular (Irregular since brain surgery) Menstrual Flow: Moderate Menstrual Control: Maxi pad Dysmenorrhea: None     Sexually active: Yes.    The current method of family planning is condoms.    Menopausal hormone therapy:  n/a Exercising: Yes.    Walking  Smoker:  no  OB History  Gravida Para Term Preterm AB Living  1 1 1   1   SAB IAB Ectopic Multiple Live Births          # Outcome Date GA Lbr Len/2nd Weight Sex Type Anes PTL Lv  1 Term              HEALTH MAINTENANCE: Last 2 paps:  12/25/22 neg, HR HPV neg, 12/19/21 neg  History of abnormal Pap or positive HPV:  no Mammogram:   01/16/24 - BI-RADS2.   Colonoscopy:  09/23/18 - states due in 2027 Bone Density:  n/a  Result  n/a   Immunization History  Administered Date(s) Administered   Fluzone Influenza virus vaccine,trivalent (IIV3), split virus 11/11/2015   Influenza, Mdck, Trivalent,PF 6+ MOS(egg free) 10/22/2023   Influenza, Seasonal, Injecte, Preservative Fre 11/21/2022   Influenza,inj,Quad PF,6+ Mos 11/11/2015, 10/24/2018, 12/02/2019, 10/25/2020, 11/14/2021   Influenza-Unspecified 11/20/2022   Moderna Sars-Covid-2 Vaccination 05/29/2019, 06/19/2019    PFIZER(Purple Top)SARS-COV-2 Vaccination 01/19/2020   Tdap 10/14/2015      reports that she has never smoked. She has never been exposed to tobacco smoke. She has never used smokeless tobacco. She reports that she does not drink alcohol and does not use drugs.  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      ferrous sulfate  324 (65 Fe) MG TBEC TAKE 1 TABLET BY MOUTH EVERY DAY 90 tablet 1   levETIRAcetam  (KEPPRA ) 1000 MG tablet TAKE 1 TABLET BY MOUTH TWICE A DAY 180 tablet 3   levETIRAcetam  (KEPPRA ) 500 MG tablet SMARTSIG:2 Tablet(s) By Mouth Every 12 Hours (Patient taking differently: TOTAL OF 1500mg  BID)     lisinopril  (ZESTRIL ) 20 MG tablet TAKE 1 TABLET BY MOUTH EVERY DAY 90 tablet 3   melatonin 3 MG TABS tablet Take 1 tablet (  3 mg total) by mouth at bedtime. 60 tablet 3   RESTASIS  0.05 % ophthalmic emulsion Place 1 drop into both eyes 2 (two) times daily.     solifenacin  (VESICARE ) 10 MG tablet Take 1 tablet (10 mg total) by mouth daily. 90 tablet 3   traZODone  (DESYREL ) 50 MG tablet TAKE 1 TABLET BY MOUTH EVERYDAY AT BEDTIME 90 tablet 2   No current facility-administered medications for this visit.    ALLERGIES: Patient has no known allergies.  Family History  Problem Relation Age of Onset   Thyroid  disease Mother    Rheum arthritis Mother    Heart attack Father 24   Hypertension Father    Heart disease Father    Healthy Brother    Healthy Paternal Grandfather    Healthy Daughter     Review of Systems  All  other systems reviewed and are negative.   PHYSICAL EXAM:  BP 124/80 (BP Location: Left Arm, Patient Position: Sitting)   Pulse 83   Ht 5' 1 (1.549 m)   Wt 158 lb (71.7 kg)   LMP 02/05/2024 (Approximate)   SpO2 96%   BMI 29.85 kg/m     General appearance: alert, cooperative and appears stated age Head: normocephalic, without obvious abnormality, atraumatic Neck: no adenopathy, supple, symmetrical, trachea midline and thyroid  normal to inspection and palpation Lungs: clear to auscultation bilaterally Breasts: normal appearance, no masses or tenderness, No nipple retraction or dimpling, No nipple discharge or bleeding, No axillary adenopathy Heart: regular rate and rhythm Abdomen: soft, non-tender; no masses, no organomegaly Extremities: extremities normal, atraumatic, no cyanosis or edema Skin: skin color, texture, turgor normal. No rashes or lesions Lymph nodes: cervical, supraclavicular, and axillary nodes normal. Neurologic: grossly normal  Pelvic: External genitalia:  no lesions              No abnormal inguinal nodes palpated.              Urethra:  normal appearing urethra with no masses, tenderness or lesions              Bartholins and Skenes: normal                 Vagina: normal appearing vagina with normal color and discharge, no lesions              Cervix: no lesions              Pap taken: yes Bimanual Exam:  Uterus:  normal size, contour, position, consistency, mobility, non-tender              Adnexa: no mass, fullness, tenderness              Rectal exam: yes.  Confirms.              Anus:  normal sphincter tone, no lesions  Chaperone was present for exam:  Kari HERO, CMA  ASSESSMENT: Well woman visit with gynecologic exam. Hx astrocytoma of brain.  Cervical cancer screening.  HTN.  Prediabetes.  Sjogren's. PHQ-2-9: 0  PLAN: Mammogram screening discussed. Self breast awareness reviewed. Pap and reflex HRV collected:  yes Guidelines for Calcium, Vitamin  D, regular exercise program including cardiovascular and weight bearing exercise. Medication refills:  NA Follow up:  yearly and prn.             "

## 2024-02-11 NOTE — Patient Instructions (Signed)

## 2024-02-12 ENCOUNTER — Other Ambulatory Visit: Payer: Self-pay

## 2024-02-12 LAB — CYTOLOGY - PAP: Diagnosis: NEGATIVE

## 2024-02-12 NOTE — Telephone Encounter (Signed)
 Results for 01/01/24 Dx MMG and 01/16/24 left breast US  in [facility].   Dr. Nikki -did you need anything additional?

## 2024-02-13 ENCOUNTER — Ambulatory Visit: Payer: Self-pay | Admitting: Obstetrics and Gynecology

## 2024-04-09 ENCOUNTER — Ambulatory Visit: Admitting: Rheumatology

## 2024-05-13 ENCOUNTER — Ambulatory Visit: Admitting: Obstetrics and Gynecology

## 2025-02-15 ENCOUNTER — Ambulatory Visit: Admitting: Obstetrics and Gynecology
